# Patient Record
Sex: Female | Born: 1965 | Race: White | Hispanic: No | Marital: Married | Smoking: Current every day smoker
Health system: Southern US, Community
[De-identification: ages and names within clinical notes are randomized; demographics above are authoritative.]

## PROBLEM LIST (undated history)

## (undated) DIAGNOSIS — D693 Immune thrombocytopenic purpura: Secondary | ICD-10-CM

## (undated) DIAGNOSIS — R110 Nausea: Secondary | ICD-10-CM

## (undated) DIAGNOSIS — J329 Chronic sinusitis, unspecified: Secondary | ICD-10-CM

## (undated) DIAGNOSIS — Z9889 Other specified postprocedural states: Secondary | ICD-10-CM

## (undated) DIAGNOSIS — N201 Calculus of ureter: Secondary | ICD-10-CM

## (undated) DIAGNOSIS — E669 Obesity, unspecified: Secondary | ICD-10-CM

## (undated) DIAGNOSIS — N289 Disorder of kidney and ureter, unspecified: Secondary | ICD-10-CM

## (undated) DIAGNOSIS — Z87442 Personal history of urinary calculi: Secondary | ICD-10-CM

## (undated) DIAGNOSIS — R739 Hyperglycemia, unspecified: Secondary | ICD-10-CM

## (undated) DIAGNOSIS — I729 Aneurysm of unspecified site: Secondary | ICD-10-CM

## (undated) DIAGNOSIS — E785 Hyperlipidemia, unspecified: Secondary | ICD-10-CM

## (undated) DIAGNOSIS — R112 Nausea with vomiting, unspecified: Secondary | ICD-10-CM

## (undated) DIAGNOSIS — D649 Anemia, unspecified: Secondary | ICD-10-CM

## (undated) DIAGNOSIS — Z87891 Personal history of nicotine dependence: Secondary | ICD-10-CM

## (undated) DIAGNOSIS — E039 Hypothyroidism, unspecified: Secondary | ICD-10-CM

## (undated) DIAGNOSIS — T7840XA Allergy, unspecified, initial encounter: Secondary | ICD-10-CM

## (undated) DIAGNOSIS — F329 Major depressive disorder, single episode, unspecified: Principal | ICD-10-CM

## (undated) HISTORY — DX: Hypocalcemia: E83.51

## (undated) HISTORY — DX: Aneurysm of unspecified site: I72.9

## (undated) HISTORY — PX: ABDOMINAL HYSTERECTOMY: SHX81

## (undated) HISTORY — DX: Nausea: R11.0

## (undated) HISTORY — DX: Hypothyroidism, unspecified: E03.9

## (undated) HISTORY — DX: Hyperlipidemia, unspecified: E78.5

## (undated) HISTORY — PX: KNEE ARTHROSCOPY: SUR90

## (undated) HISTORY — DX: Major depressive disorder, single episode, unspecified: F32.9

## (undated) HISTORY — DX: Allergy, unspecified, initial encounter: T78.40XA

## (undated) HISTORY — DX: Personal history of nicotine dependence: Z87.891

## (undated) HISTORY — DX: Obesity, unspecified: E66.9

## (undated) HISTORY — DX: Chronic sinusitis, unspecified: J32.9

## (undated) HISTORY — DX: Hyperglycemia, unspecified: R73.9

## (undated) HISTORY — DX: Anemia, unspecified: D64.9

---

## 1997-11-10 HISTORY — PX: TUBAL LIGATION: SHX77

## 1998-11-10 HISTORY — PX: CHOLECYSTECTOMY: SHX55

## 2001-11-10 HISTORY — PX: EXTRACORPOREAL SHOCK WAVE LITHOTRIPSY: SHX1557

## 2010-04-03 ENCOUNTER — Emergency Department (HOSPITAL_BASED_OUTPATIENT_CLINIC_OR_DEPARTMENT_OTHER): Admission: EM | Admit: 2010-04-03 | Discharge: 2010-04-03 | Payer: Self-pay | Admitting: Emergency Medicine

## 2010-04-03 ENCOUNTER — Ambulatory Visit: Payer: Self-pay | Admitting: Interventional Radiology

## 2010-08-06 ENCOUNTER — Encounter: Admission: RE | Admit: 2010-08-06 | Discharge: 2010-08-06 | Payer: Self-pay | Admitting: Obstetrics and Gynecology

## 2010-10-15 ENCOUNTER — Ambulatory Visit (HOSPITAL_COMMUNITY)
Admission: RE | Admit: 2010-10-15 | Discharge: 2010-10-16 | Payer: Self-pay | Source: Home / Self Care | Attending: Obstetrics & Gynecology | Admitting: Obstetrics & Gynecology

## 2010-10-15 ENCOUNTER — Encounter: Payer: Self-pay | Admitting: Obstetrics & Gynecology

## 2010-10-15 HISTORY — PX: ROBOTIC ASSISTED TOTAL HYSTERECTOMY: SHX6085

## 2010-10-24 ENCOUNTER — Emergency Department (HOSPITAL_COMMUNITY)
Admission: EM | Admit: 2010-10-24 | Discharge: 2010-10-25 | Payer: Self-pay | Source: Home / Self Care | Admitting: Emergency Medicine

## 2011-01-20 LAB — DIFFERENTIAL
Basophils Relative: 0 % (ref 0–1)
Eosinophils Absolute: 0 10*3/uL (ref 0.0–0.7)
Lymphocytes Relative: 6 % — ABNORMAL LOW (ref 12–46)
Lymphs Abs: 1.1 10*3/uL (ref 0.7–4.0)
Monocytes Absolute: 1.2 10*3/uL — ABNORMAL HIGH (ref 0.1–1.0)
Monocytes Relative: 7 % (ref 3–12)

## 2011-01-20 LAB — URINALYSIS, ROUTINE W REFLEX MICROSCOPIC
Glucose, UA: NEGATIVE mg/dL
Protein, ur: 30 mg/dL — AB
Specific Gravity, Urine: 1.027 (ref 1.005–1.030)
Urobilinogen, UA: 0.2 mg/dL (ref 0.0–1.0)
pH: 6 (ref 5.0–8.0)

## 2011-01-20 LAB — BASIC METABOLIC PANEL
BUN: 8 mg/dL (ref 6–23)
CO2: 21 mEq/L (ref 19–32)
Calcium: 8.8 mg/dL (ref 8.4–10.5)
Chloride: 107 mEq/L (ref 96–112)
Creatinine, Ser: 0.93 mg/dL (ref 0.4–1.2)
GFR calc non Af Amer: >60 mL/min (ref >60)
Sodium: 135 mEq/L (ref 135–145)

## 2011-01-20 LAB — CBC
MCV: 92.4 fL (ref 78.0–100.0)
Platelets: 108 10*3/uL — ABNORMAL LOW (ref 150–400)
RDW: 12.2 % (ref 11.5–15.5)
WBC: 18.9 10*3/uL — ABNORMAL HIGH (ref 4.0–10.5)

## 2011-01-20 LAB — URINE MICROSCOPIC-ADD ON

## 2011-01-21 LAB — CBC
HCT: 44.5 % (ref 36.0–46.0)
MCV: 95.9 fL (ref 78.0–100.0)
MCV: 95.9 fL (ref 78.0–100.0)
Platelets: 105 10*3/uL — ABNORMAL LOW (ref 150–400)
Platelets: 137 10*3/uL — ABNORMAL LOW (ref 150–400)
RBC: 3.53 MIL/uL — ABNORMAL LOW (ref 3.87–5.11)
RDW: 12.4 % (ref 11.5–15.5)
RDW: 12.5 % (ref 11.5–15.5)
WBC: 10.2 10*3/uL (ref 4.0–10.5)
WBC: 11.2 10*3/uL — ABNORMAL HIGH (ref 4.0–10.5)

## 2011-01-21 LAB — SURGICAL PCR SCREEN
MRSA, PCR: NEGATIVE
Staphylococcus aureus: POSITIVE — AB

## 2011-01-21 LAB — PREGNANCY, URINE: Preg Test, Ur: NEGATIVE

## 2011-04-29 ENCOUNTER — Encounter: Payer: Self-pay | Admitting: Family Medicine

## 2011-04-29 ENCOUNTER — Ambulatory Visit (INDEPENDENT_AMBULATORY_CARE_PROVIDER_SITE_OTHER): Payer: Self-pay | Admitting: Family Medicine

## 2011-04-29 DIAGNOSIS — E669 Obesity, unspecified: Secondary | ICD-10-CM | POA: Insufficient documentation

## 2011-04-29 DIAGNOSIS — R5383 Other fatigue: Secondary | ICD-10-CM

## 2011-04-29 DIAGNOSIS — F17200 Nicotine dependence, unspecified, uncomplicated: Secondary | ICD-10-CM

## 2011-04-29 DIAGNOSIS — Z720 Tobacco use: Secondary | ICD-10-CM

## 2011-04-29 DIAGNOSIS — Z0000 Encounter for general adult medical examination without abnormal findings: Secondary | ICD-10-CM

## 2011-04-29 DIAGNOSIS — N800 Endometriosis of uterus: Secondary | ICD-10-CM

## 2011-04-29 DIAGNOSIS — E039 Hypothyroidism, unspecified: Secondary | ICD-10-CM

## 2011-04-29 DIAGNOSIS — F329 Major depressive disorder, single episode, unspecified: Secondary | ICD-10-CM

## 2011-04-29 DIAGNOSIS — E663 Overweight: Secondary | ICD-10-CM

## 2011-04-29 DIAGNOSIS — R5381 Other malaise: Secondary | ICD-10-CM

## 2011-04-29 MED ORDER — NICOTINE 10 MG IN INHA: RESPIRATORY_TRACT | Status: DC

## 2011-04-29 MED ORDER — LORAZEPAM 0.5 MG PO TABS: 0.5000 mg | ORAL_TABLET | Freq: Two times a day (BID) | ORAL | Status: DC

## 2011-04-29 MED ORDER — LORAZEPAM 0.5 MG PO TABS: 0.5000 mg | ORAL_TABLET | Freq: Every day | ORAL | Status: AC | PRN

## 2011-04-29 MED ORDER — BUPROPION HCL ER (XL) 150 MG PO TB24: ORAL_TABLET | ORAL | Status: DC

## 2011-04-29 NOTE — Patient Instructions (Signed)
Depression You have signs of depression. This is a common problem. It can occur at any age. It is often hard to recognize. People can suffer from depression and still have moments of enjoyment. Depression interferes with your basic ability to function in life. It upsets your relationships, sleep, eating, and work habits. CAUSES Depression is believed to be caused by an imbalance in brain chemicals. It may be triggered by an unpleasant event. Relationship crises, a death in the family, financial worries, retirement, or other stressors are normal causes of depression. Depression may also start for no known reason. Other factors that may play a part include medical illnesses, some medicines, genetics, and alcohol or drug abuse. SYMPTOMS  Feeling unhappy or worthless.   Long-lasting (chronic) tiredness or worn-out feeling.   Self-destructive thoughts and actions.   Not being able to sleep or sleeping too much.   Eating more than usual or not eating at all.   Headaches or feeling anxious.   Trouble concentrating or making decisions.   Unexplained physical problems and substance abuse.  TREATMENT Depression usually gets better with treatment. This can include:  Antidepressant medicines. It can take weeks before the proper dose is achieved and benefits are reached.   Talking with a therapist, clergyperson, counselor, or friend. These people can help you gain insight into your problem and regain control of your life.   Eating a good diet.   Getting regular physical exercise, such as walking for 30 minutes every day.   Not abusing alcohol or drugs.  Treating depression often takes 6 months or longer. This length of treatment is needed to keep symptoms from returning. Call your caregiver and arrange for follow-up care as suggested. SEEK IMMEDIATE MEDICAL CARE IF:  You start to have thoughts of hurting yourself or others.   Call your local emergency services (911 in [place]   Go to your  local medical emergency department.   Call the National Suicide Prevention Lifeline: 1-800-273-TALK (1-800-273-8255).  Document Released: 10/27/2005 Document Re-Released: 04/16/2010 [employer] Patient Information 2011 [employer]. 

## 2011-04-30 ENCOUNTER — Encounter: Payer: Self-pay | Admitting: Family Medicine

## 2011-04-30 DIAGNOSIS — F17200 Nicotine dependence, unspecified, uncomplicated: Secondary | ICD-10-CM | POA: Insufficient documentation

## 2011-04-30 DIAGNOSIS — Z87891 Personal history of nicotine dependence: Secondary | ICD-10-CM

## 2011-04-30 DIAGNOSIS — N800 Endometriosis of uterus: Secondary | ICD-10-CM | POA: Insufficient documentation

## 2011-04-30 DIAGNOSIS — M779 Enthesopathy, unspecified: Secondary | ICD-10-CM | POA: Insufficient documentation

## 2011-04-30 DIAGNOSIS — N8003 Adenomyosis of the uterus: Secondary | ICD-10-CM | POA: Insufficient documentation

## 2011-04-30 DIAGNOSIS — F329 Major depressive disorder, single episode, unspecified: Secondary | ICD-10-CM | POA: Insufficient documentation

## 2011-04-30 DIAGNOSIS — F32A Depression, unspecified: Secondary | ICD-10-CM

## 2011-04-30 DIAGNOSIS — E039 Hypothyroidism, unspecified: Secondary | ICD-10-CM

## 2011-04-30 DIAGNOSIS — R5383 Other fatigue: Secondary | ICD-10-CM | POA: Insufficient documentation

## 2011-04-30 HISTORY — DX: Personal history of nicotine dependence: Z87.891

## 2011-04-30 HISTORY — DX: Depression, unspecified: F32.A

## 2011-04-30 HISTORY — DX: Hypothyroidism, unspecified: E03.9

## 2011-04-30 NOTE — Assessment & Plan Note (Signed)
Patient notes a marked increase in fatigue in the last 2 weeks. Says the symptoms are similar to when her hypothyroidism was first discovered we'll check a TSH before making any further changes.

## 2011-04-30 NOTE — Assessment & Plan Note (Signed)
He reports a history of low calcium

## 2011-04-30 NOTE — Assessment & Plan Note (Signed)
Patient is under a great deal of stress with two teenagers at home and a very sick husband. Her husband has recently suffered a major heart attack. He had his first one at 36 and has recently had a significant injury and is now struggling with pleural effusions in congestive heart failure and has a defibrillator in place. The patient cries easily during the visit and is willing to start medications to help her manage her various stressors. We will start Wellbutrin XL 100 mg daily for the next 4 days and then increase to 20 mg daily she is given a small amount of Ativan to help with any panic moments for sleep to use sparingly. She is offered counseling and given informational about behavioral health. She is not sure she wants to proceed with the counseling at this time due to her time constraints but will start medications and we will see her in followup in one month's time or as needed.

## 2011-04-30 NOTE — Progress Notes (Signed)
Peggy Thomas [id number] [birth year] 04/30/2011      Progress Note New Patient  Subjective  Chief Complaint  Chief Complaint  Patient presents with  . Establish Care    new patient    HPI  Patient is a 45 year old Caucasian female who is in for new patient appointment. Additionally she reports she is enlarged and she needs a primary care physician but quickly begins to talk about her multiple stressors. Her husband has struggled with her disease since his mid-30s and recently has had a massive MI. He is presently a defibrillator in place has had significant heart muscle damage is struggling with congestive heart failure and fluid overload. He cries easily and speaks of also having to change at home. Her husband up copes with his stress by not discussing his health problems and her kids are coming to her for information about their father. She does not have a strong support system. She has no family in the area and is aware that she is not taking good care of herself in the middle of all the stress. She reports a long history, intermittent tendinitis in various joints at times. Denies any pain at the present time her recent injury. 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 She did report a good was covering and is happy she went through the procedure. She has a history of hypothyroidism and is presently struggling with increased fatigue over roughly the last 2 weeks. She reports a little fatigue is similar to the level she was struggling with him he does first discovered her hypothyroidism. She also reports a sensation of her neck feeling a little uncomfortable but also similar to the way she felt in her thyroid disease was discovered. She reports a previous physician had told her her calcium was low and she's been advised. She denies any recent febrile illness, fevers, chills, chest pain, palpitations, shortness of breath, muscle cramps, GI or GU  complaints at this time.  Past Medical History  Diagnosis Date  . Chicken pox   . Kidney stones   . Thyroid disease     hypothyroidism  . Hypocalcemia   . Overweight   . Tendonitis     migratory  . Fatigue   . Depression 04/30/2011  . Hypothyroidism 04/30/2011  . Tobacco abuse 04/30/2011    Past Surgical History  Procedure Date  . Cholecystectomy 2000  . Lasered kidney stones 2003    possibly right side  . Tubes tied 1999  . Cesarean section 1996 and 1998    X 2  . Abdominal hysterectomy 12-11    partial  . Knee arthroscopy     Family History  Problem Relation Age of Onset  . Diabetes Mother     type 2  . Asthma Mother   . Cancer Mother     skin cancer  . Cancer Father     bone  . Mental illness Sister     schizophrenia  . Alcohol abuse Maternal Grandmother   . Stroke Maternal Grandmother   . Other Maternal Grandfather     black lung  . Alzheimer's disease Paternal Grandmother   . Stroke Maternal Aunt     History   Social History  . Marital Status: Married    Spouse Name: N/A    Number of Children: N/A  . Years of Education: N/A   Occupational History  . Not on file.   Social History Main Topics  .  Smoking status: Current Everyday Smoker -- 1.0 packs/day for 30 years    Types: Cigarettes  . Smokeless tobacco: Never Used  . Alcohol Use: No     occasionally  . Drug Use: No  . Sexually Active: Yes -- Female partner(s)   Other Topics Concern  . Not on file   Social History Narrative  . No narrative on file    No current outpatient prescriptions on file prior to visit.    No Known Allergies  Review of Systems  Review of Systems  Constitutional: Positive for malaise/fatigue. Negative for fever, chills and weight loss.  HENT: Positive for neck pain. Negative for hearing loss, nosebleeds, congestion and sore throat.   Eyes: Negative for discharge.  Respiratory: Negative for cough, sputum production, shortness of breath, wheezing and stridor.    Cardiovascular: Negative for chest pain, palpitations and leg swelling.  Gastrointestinal: Negative for heartburn, nausea, vomiting, abdominal pain, diarrhea, constipation and blood in stool.  Genitourinary: Negative for dysuria, urgency, frequency and hematuria.  Musculoskeletal: Negative for myalgias, back pain and falls.  Skin: Negative for rash.  Neurological: Negative for dizziness, tremors, sensory change, focal weakness, loss of consciousness, weakness and headaches.  Endo/Heme/Allergies: Negative for polydipsia. Does not bruise/bleed easily.  Psychiatric/Behavioral: Positive for depression. Negative for suicidal ideas and substance abuse. The patient is nervous/anxious. The patient does not have insomnia.     Objective  BP 109/76  Pulse 60  Temp(Src) 98.1 F (36.7 C) (Oral)  Ht 5' 2.25" (1.581 m)  Wt 187 lb 6.4 oz (85.004 kg)  BMI 34.00 kg/m2  SpO2 97%  Physical Exam  Physical Exam  Constitutional: She is oriented to person, place, and time and well-developed, well-nourished, and in no distress. No distress.  HENT:  Head: Normocephalic and atraumatic.  Right Ear: External ear normal.  Left Ear: External ear normal.  Nose: Nose normal.  Mouth/Throat: Oropharynx is clear and moist. No oropharyngeal exudate.  Eyes: Conjunctivae are normal. Pupils are equal, round, and reactive to light. Right eye exhibits no discharge. Left eye exhibits no discharge. No scleral icterus.  Neck: Normal range of motion. Neck supple. Thyromegaly present.       Slight, diffuse enlargement of thyroid gland, no nodules palpable  Cardiovascular: Normal rate, regular rhythm, normal heart sounds and intact distal pulses.   No murmur heard. Pulmonary/Chest: Effort normal and breath sounds normal. No respiratory distress. She has no wheezes. She has no rales.  Abdominal: Soft. Bowel sounds are normal. She exhibits no distension and no mass. There is no tenderness.  Musculoskeletal: Normal range of  motion. She exhibits no edema and no tenderness.  Lymphadenopathy:    She has no cervical adenopathy.  Neurological: She is alert and oriented to person, place, and time. She has normal reflexes. No cranial nerve deficit. Gait normal. Coordination normal.  Skin: Skin is warm and dry. No rash noted. She is not diaphoretic.  Psychiatric: Memory and affect normal.       Cries easily       Assessment & Plan  Depression Patient is under a great deal of stress with two teenagers at home and a very sick husband. Her husband has recently suffered a major heart attack. He had his first one at 20 and has recently had a significant injury and is now struggling with pleural effusions in congestive heart failure and has a defibrillator in place. The patient cries easily during the visit and is willing to start medications to help her manage her  various stressors. We will start Wellbutrin XL 100 mg daily for the next 4 days and then increase to 20 mg daily she is given a small amount of Ativan to help with any panic moments for sleep to use sparingly. She is offered counseling and given informational about behavioral health. She is not sure she wants to proceed with the counseling at this time due to her time constraints but will start medications and we will see her in followup in one month's time or as needed.  Hypocalcemia He reports a history of low calcium  Fatigue Patient notes a marked increase in fatigue in the last 2 weeks. Says the symptoms are similar to when her hypothyroidism was first discovered we'll check a TSH before making any further changes.  Hypothyroidism Patient with history of hypothyroidism and recent increased fatigue we'll check a TSH request old records from her previous physician here  Overweight Patient under a great of stress right now but is encouraged to decrease by mouth intake and try to get it  Tobacco abuse Patient aware that she needs to quitting. He is willing  to try this given a prescription for Nicotrol inhaler to use and replacement of her cigarettes. She will try to weekend and if he either inhaler or cigarette and we will reevaluate at next visit she is counseled for greater than 3 minutes regarding the important health consequences of continuing to smoke and didn't need for cessation.

## 2011-04-30 NOTE — Assessment & Plan Note (Signed)
Patient with history of hypothyroidism and recent increased fatigue we'll check a TSH request old records from her previous physician here

## 2011-04-30 NOTE — Assessment & Plan Note (Signed)
Patient under a great of stress right now but is encouraged to decrease by mouth intake and try to get it

## 2011-04-30 NOTE — Assessment & Plan Note (Signed)
Patient aware that she needs to quitting. He is willing to try this given a prescription for Nicotrol inhaler to use and replacement of her cigarettes. She will try to weekend and if he either inhaler or cigarette and we will reevaluate at next visit she is counseled for greater than 3 minutes regarding the important health consequences of continuing to smoke and didn't need for cessation.

## 2011-05-05 ENCOUNTER — Other Ambulatory Visit (INDEPENDENT_AMBULATORY_CARE_PROVIDER_SITE_OTHER): Payer: Private Health Insurance - Indemnity

## 2011-05-05 DIAGNOSIS — Z0000 Encounter for general adult medical examination without abnormal findings: Secondary | ICD-10-CM

## 2011-05-05 DIAGNOSIS — E039 Hypothyroidism, unspecified: Secondary | ICD-10-CM

## 2011-05-05 LAB — HEPATIC FUNCTION PANEL
ALT: 25 U/L (ref 0–35)
AST: 18 U/L (ref 0–37)
Albumin: 4.5 g/dL (ref 3.5–5.2)
Total Bilirubin: 0.6 mg/dL (ref 0.3–1.2)
Total Protein: 7 g/dL (ref 6.0–8.3)

## 2011-05-05 LAB — LIPID PANEL
Cholesterol: 144 mg/dL (ref 0–200)
HDL: 36 mg/dL — ABNORMAL LOW (ref >39.00)
Total CHOL/HDL Ratio: 4
Triglycerides: 108 mg/dL (ref 0.0–149.0)

## 2011-05-05 LAB — CBC
MCH: 32 pg (ref 26.0–34.0)
Platelets: 108 10*3/uL — ABNORMAL LOW (ref 150–400)
WBC: 6.9 10*3/uL (ref 4.0–10.5)

## 2011-05-05 LAB — RENAL FUNCTION PANEL
BUN: 13 mg/dL (ref 6–23)
Calcium: 9.2 mg/dL (ref 8.4–10.5)
GFR: 62.35 mL/min (ref >60.00)
Sodium: 140 mEq/L (ref 135–145)

## 2011-05-05 LAB — TSH: TSH: 4.67 u[IU]/mL (ref 0.35–5.50)

## 2011-05-07 LAB — VITAMIN D 1,25 DIHYDROXY
Vitamin D 1, 25 (OH)2 Total: 44 pg/mL (ref 18–72)
Vitamin D3 1, 25 (OH)2: 44 pg/mL

## 2011-05-29 ENCOUNTER — Encounter: Payer: Self-pay | Admitting: Family Medicine

## 2011-05-29 ENCOUNTER — Ambulatory Visit (INDEPENDENT_AMBULATORY_CARE_PROVIDER_SITE_OTHER): Payer: Private Health Insurance - Indemnity | Admitting: Family Medicine

## 2011-05-29 VITALS — BP 132/84 | HR 67 | Temp 98.1°F | Ht 62.25 in | Wt 188.8 lb

## 2011-05-29 DIAGNOSIS — F17200 Nicotine dependence, unspecified, uncomplicated: Secondary | ICD-10-CM

## 2011-05-29 DIAGNOSIS — Z720 Tobacco use: Secondary | ICD-10-CM

## 2011-05-29 DIAGNOSIS — E039 Hypothyroidism, unspecified: Secondary | ICD-10-CM

## 2011-05-29 DIAGNOSIS — R5383 Other fatigue: Secondary | ICD-10-CM

## 2011-05-29 DIAGNOSIS — D696 Thrombocytopenia, unspecified: Secondary | ICD-10-CM

## 2011-05-29 DIAGNOSIS — E663 Overweight: Secondary | ICD-10-CM

## 2011-05-29 DIAGNOSIS — F329 Major depressive disorder, single episode, unspecified: Secondary | ICD-10-CM

## 2011-05-29 LAB — CBC WITH DIFFERENTIAL/PLATELET
Basophils Absolute: 0.1 10*3/uL (ref 0.0–0.1)
Basophils Relative: 0.6 % (ref 0.0–3.0)
Eosinophils Absolute: 0.1 10*3/uL (ref 0.0–0.7)
MCHC: 34.5 g/dL (ref 30.0–36.0)
MCV: 95.1 fl (ref 78.0–100.0)
Monocytes Absolute: 0.4 10*3/uL (ref 0.1–1.0)
Neutro Abs: 5.8 10*3/uL (ref 1.4–7.7)
Neutrophils Relative %: 67.3 % (ref 43.0–77.0)
RBC: 4.97 Mil/uL (ref 3.87–5.11)
RDW: 13.4 % (ref 11.5–14.6)

## 2011-05-29 MED ORDER — BUPROPION HCL ER (XL) 150 MG PO TB24: 150.0000 mg | ORAL_TABLET | ORAL | Status: DC

## 2011-05-29 MED ORDER — LEVOTHYROXINE SODIUM 50 MCG PO TABS: ORAL_TABLET | ORAL | Status: DC

## 2011-05-29 NOTE — Assessment & Plan Note (Signed)
Patient is still struggling with fatigue and her TSH mildly elevated, will have her start taking one extra Levothyroxine on Sundays each week and recheck in 8-10 weeks

## 2011-05-29 NOTE — Assessment & Plan Note (Signed)
Results of lab work we'll continue to monitor her

## 2011-05-29 NOTE — Assessment & Plan Note (Signed)
Patient try Bupropion on a good while on 150 mg dose and even within a few days felt her mood was mildly elevated. Unfortunately when she increased to the 300 mg dose within a few days she felt paranoid about her heart was racing so she stopped the medication altogether. She would like to restart the bupropion 150 mg daily and we will reevaluate at next visit. She never did try the lorazepam even when her husband recently underwent cardiac surgery secondary to she was afraid to over sedate her. She is encouraged to try half a tablet when she feels somewhat stressed but does not have any chance of having to go out to just evaluate her response to the medication so we need in the future she will feel comfortable using it.

## 2011-05-29 NOTE — Assessment & Plan Note (Signed)
Patient has had blood work for more than 6 months apart both showing mildly low platelets. Patient asymptomatic. Will repeat CBC today to continue to monitor. At last blood her hemoglobin was also mildly elevated so we will keep an eye on this. It had been normal in the past

## 2011-05-29 NOTE — Assessment & Plan Note (Signed)
Persistent but not worsening multifactorial. We'll increase levothyroxine slightly encouraged lifestyle changes such as good sleep and adequate exercise and we'll reassess at next visit

## 2011-05-29 NOTE — Assessment & Plan Note (Signed)
Encourage decreased by mouth intake and a 5-10 minute walk daily.

## 2011-05-29 NOTE — Patient Instructions (Signed)
Thrombocytopenia Thrombocytopenia is an uncommon, serious blood disease. Thrombocytopenia means there are not enough platelets in your blood. These are one of the cells necessary for clotting. Platelets are also called thrombocytes. CAUSES AND RISK FACTORS Thrombocytopenia is caused by:   Decreased production of platelets.   Increased destructions of platelets.   Enlarged spleen (hypersplenism).  Decreased production of platelets in the bone marrow can be caused by:  A plastic anemia in which your bone marrow quits making blood cells.   Cancer in the bone marrow.   Certain medications, including chemotherapy.   Overwhelming infection in the bone marrow.  Increased breakdown of platelets occurs in the bloodstream, spleen, or liver. This category includes:   Immune thrombocytopenic purpura (ITP).   Drug-induced thrombocytopenia.   Thrombotic thrombocytopenic purpura (TTP).   Certain inherited disorders.   Disseminated intravascular coagulation (DIC).  In hypersplenism, an enlarged spleen gathers up platelets from circulation so they are not available to do their job and help with clotting. The spleen can enlarge in cirrhosis and other conditions.  SYMPTOMS All of the symptoms of thrombocytopenia are essentially a side effect of poor blood clotting. Some of these are:  Abnormal bleeding.  Nosebleeds.   Fever.   Easy fatigue.   Shortness of breath on exertion.   Changes in awareness.   Confusion.   Purpura -- purplish discolorations in the skin produced by small bleeding vessels near the surface of the skin.   Bruising.  Yellowish color to the skin (jaundice).   Weakness.   Pallor.   Heart rate over 100 beats per minute.   Headache.   Speech changes.   Rash which may be petechial (like pin point purplish red spots on the skin and mucous membranes caused by bleeding from capillaries).   DIAGNOSIS Your caregiver will make this diagnosis based on exam and with  the use of blood tests. Routine blood work will show decreased platelets in the blood.  Sometimes a bone marrow study is done to look for the original cells (megakaryocytes), which make platelets.   Mucus membrane biopsy, if done, may show platelet clots in small blood vessels.  TREATMENT  Treatment depends on the cause of the condition.   In some cases, a replacement (transfusion) of platelets may be required to stop or prevent bleeding.   Sometimes the spleen must be surgically removed.   Plasmapheresis may be used to remove proteins from the blood that cause platelet destruction.  PROGNOSIS The outcome depends on the disorder causing the low platelet counts and on how low the platelet count falls. Any system in the body can be affected if there is too much bleeding. Some problems are:  Excessive bleeding (hemorrhage).   Vomiting blood or blood in the stools (gastrointestinal bleeding).   Bleeding in the brain (intracranial hemorrhage).  HOME CARE INSTRUCTIONS  Check the skin and linings inside your mouth for bruising or bleeding. Call your caregiver if you develop unexplained bleeding or bruising.   Watch your sputum, urine and stool for blood. Report any signs of bleeding to your caregiver.   DO NOT return to any activities that could cause bumps and bruises until given the OK by your caregiver.   Take extra care not to cut yourself when using scissors, needles, knives, tools. Try not to burn yourself when ironing or cooking.   Notify any healthcare providers, including dentists and eye doctors, of your condition.  SEEK IMMEDIATE MEDICAL CARE IF:  You develop headaches or blurred vision.  You develop active bleeding from anywhere in your body.   You become extremely tired, weak, or dizzy.   You become short of breath or develop pain.  Document Released: 10/27/2005 Document Re-Released: 04/24/2008 [facility] Patient Information 2011 [place], [place].

## 2011-05-29 NOTE — Progress Notes (Signed)
Peggy Thomas [id number] Aug 21, 1966 05/29/2011      Progress Note-Follow Up  Subjective  Chief Complaint  Chief Complaint  Patient presents with  . Follow-up    1 month follow up    HPI  Patient is a 45 year old Caucasian female who is in today for followup appointment. At her last visit she was struggling with a lot of stressors and very labile. She continues to show stressors including her husband's recent cardiac surgery. She will try bupropion at her last visit and she says that her 150 mg nightly she felt some mental clearing and slightly, but then when she increased the milligram dose she became paranoid tachycardic so she stopped the medication. At present she continues to have low mood, difficulty concentrating and lability. No suicidal ideation. She does feel she could use the medication and is willing to retry the bupropion at 150 mg. She never tried the lorazepam as she was afraid we'll pursue that her period she's had no recent illness, fevers, chills, chest pain, palpitations, shortness of breath, GI or GU complaints. She does have persistent fatigue.  Past Medical History  Diagnosis Date  . Chicken pox   . Kidney stones   . Thyroid disease     hypothyroidism  . Hypocalcemia   . Overweight   . Tendonitis     migratory  . Fatigue   . Depression 04/30/2011  . Hypothyroidism 04/30/2011  . Tobacco abuse 04/30/2011  . Uterus, adenomyosis 04/30/2011  . Thrombocytopenia 05/29/2011    Past Surgical History  Procedure Date  . Cholecystectomy 2000  . Lasered kidney stones 2003    possibly right side  . Tubes tied 1999  . Cesarean section 1996 and 1998    X 2  . Abdominal hysterectomy 12-11    partial  . Knee arthroscopy     Family History  Problem Relation Age of Onset  . Diabetes Mother     type 2  . Asthma Mother   . Cancer Mother     skin cancer  . Cancer Father     bone  . Mental illness Sister     schizophrenia  . Alcohol abuse Maternal Grandmother     . Stroke Maternal Grandmother   . Other Maternal Grandfather     black lung  . Alzheimer's disease Paternal Grandmother   . Stroke Maternal Aunt     History   Social History  . Marital Status: Married    Spouse Name: N/A    Number of Children: N/A  . Years of Education: N/A   Occupational History  . Not on file.   Social History Main Topics  . Smoking status: Current Everyday Smoker -- 1.0 packs/day for 30 years    Types: Cigarettes  . Smokeless tobacco: Never Used  . Alcohol Use: No     occasionally  . Drug Use: No  . Sexually Active: Yes -- Female partner(s)   Other Topics Concern  . Not on file   Social History Narrative  . No narrative on file    Current Outpatient Prescriptions on File Prior to Visit  Medication Sig Dispense Refill  . LORazepam (ATIVAN) 0.5 MG tablet Take 1 tablet (0.5 mg total) by mouth daily as needed for anxiety.  30 tablet  1  . nicotine (NICOTROL) 10 MG inhaler 1 puff po prn nicotine dependence, may repeat every 2 hours as needed, max of 10 cartridges daily  300 each  1    No Known Allergies  Review of Systems  Review of Systems  Constitutional: Positive for malaise/fatigue. Negative for fever.  HENT: Negative for congestion.   Eyes: Negative for discharge.  Respiratory: Negative for shortness of breath.   Cardiovascular: Negative for chest pain, palpitations and leg swelling.  Gastrointestinal: Negative for nausea, abdominal pain and diarrhea.  Genitourinary: Negative for dysuria.  Musculoskeletal: Negative for falls.  Skin: Negative for rash.  Neurological: Negative for loss of consciousness and headaches.  Endo/Heme/Allergies: Negative for polydipsia.  Psychiatric/Behavioral: Positive for depression. Negative for suicidal ideas. The patient is nervous/anxious. The patient does not have insomnia.     Objective  BP 132/84  Pulse 67  Temp(Src) 98.1 F (36.7 C) (Oral)  Ht 5' 2.25" (1.581 m)  Wt 188 lb 12.8 oz (85.639 kg)   BMI 34.26 kg/m2  SpO2 96%  Physical Exam  Physical Exam  Constitutional: She is oriented to person, place, and time and well-developed, well-nourished, and in no distress. No distress.  HENT:  Head: Normocephalic and atraumatic.  Eyes: Conjunctivae are normal.  Neck: Neck supple. No thyromegaly present.  Cardiovascular: Normal rate, regular rhythm and normal heart sounds.   No murmur heard. Pulmonary/Chest: Effort normal and breath sounds normal. She has no wheezes.  Abdominal: She exhibits no distension and no mass.  Musculoskeletal: She exhibits no edema.  Lymphadenopathy:    She has no cervical adenopathy.  Neurological: She is alert and oriented to person, place, and time.  Skin: Skin is warm and dry. No rash noted. She is not diaphoretic.  Psychiatric: Memory, affect and judgment normal.    Lab Results  Component Value Date   TSH 4.67 05/05/2011   Lab Results  Component Value Date   WBC 6.9 05/05/2011   HGB 16.0* 05/05/2011   HCT 46.5* 05/05/2011   MCV 93.0 05/05/2011   PLT 108* 05/05/2011   Lab Results  Component Value Date   CREATININE 1.0 05/05/2011   BUN 13 05/05/2011   NA 140 05/05/2011   K 4.9 05/05/2011   CL 107 05/05/2011   CO2 26 05/05/2011   Lab Results  Component Value Date   ALT 25 05/05/2011   AST 18 05/05/2011   ALKPHOS 52 05/05/2011   BILITOT 0.6 05/05/2011   Lab Results  Component Value Date   CHOL 144 05/05/2011   Lab Results  Component Value Date   HDL 36.00* 05/05/2011   Lab Results  Component Value Date   LDLCALC 86 05/05/2011   Lab Results  Component Value Date   TRIG 108.0 05/05/2011   Lab Results  Component Value Date   CHOLHDL 4 05/05/2011     Assessment & Plan  Hypothyroidism Patient is still struggling with fatigue and her TSH mildly elevated, will have her start taking one extra Levothyroxine on Sundays each week and recheck in 8-10 weeks  Overweight Encourage decreased by mouth intake and a 5-10 minute walk  daily.  Hypocalcemia Results of lab work we'll continue to monitor her  Fatigue Persistent but not worsening multifactorial. We'll increase levothyroxine slightly encouraged lifestyle changes such as good sleep and adequate exercise and we'll reassess at next visit  Tobacco abuse Patient continues to smoke. Did pick up the Nicotrol Inhaler and understands that she needs to quit but doesn't feel admitted yet encouraged to make a plan so we can proceed with further cessation patient in agreement. Counseled for greater than 3 minutes in this regard  Depression Patient try Bupropion on a good while on 150 mg dose and even  within a few days felt her mood was mildly elevated. Unfortunately when she increased to the 300 mg dose within a few days she felt paranoid about her heart was racing so she stopped the medication altogether. She would like to restart the bupropion 150 mg daily and we will reevaluate at next visit. She never did try the lorazepam even when her husband recently underwent cardiac surgery secondary to she was afraid to over sedate her. She is encouraged to try half a tablet when she feels somewhat stressed but does not have any chance of having to go out to just evaluate her response to the medication so we need in the future she will feel comfortable using it.  Thrombocytopenia Patient has had blood work for more than 6 months apart both showing mildly low platelets. Patient asymptomatic. Will repeat CBC today to continue to monitor. At last blood her hemoglobin was also mildly elevated so we will keep an eye on this. It had been normal in the past

## 2011-05-29 NOTE — Assessment & Plan Note (Signed)
Patient continues to smoke. Did pick up the Nicotrol Inhaler and understands that she needs to quit but doesn't feel admitted yet encouraged to make a plan so we can proceed with further cessation patient in agreement. Counseled for greater than 3 minutes in this regard

## 2011-07-23 ENCOUNTER — Other Ambulatory Visit (INDEPENDENT_AMBULATORY_CARE_PROVIDER_SITE_OTHER): Payer: Private Health Insurance - Indemnity

## 2011-07-23 DIAGNOSIS — D696 Thrombocytopenia, unspecified: Secondary | ICD-10-CM

## 2011-07-23 DIAGNOSIS — E039 Hypothyroidism, unspecified: Secondary | ICD-10-CM

## 2011-07-23 LAB — CBC WITH DIFFERENTIAL/PLATELET
Basophils Absolute: 0 10*3/uL (ref 0.0–0.1)
Eosinophils Absolute: 0.1 10*3/uL (ref 0.0–0.7)
Lymphocytes Relative: 26.9 % (ref 12.0–46.0)
MCHC: 34 g/dL (ref 30.0–36.0)
Monocytes Relative: 5 % (ref 3.0–12.0)
Neutrophils Relative %: 65.7 % (ref 43.0–77.0)
RBC: 4.88 Mil/uL (ref 3.87–5.11)
RDW: 12.8 % (ref 11.5–14.6)

## 2011-07-23 LAB — TSH: TSH: 1.77 u[IU]/mL (ref 0.35–5.50)

## 2011-07-28 ENCOUNTER — Encounter: Payer: Self-pay | Admitting: Family Medicine

## 2011-07-28 ENCOUNTER — Ambulatory Visit (INDEPENDENT_AMBULATORY_CARE_PROVIDER_SITE_OTHER): Payer: Private Health Insurance - Indemnity | Admitting: Family Medicine

## 2011-07-28 VITALS — BP 138/89 | HR 72 | Temp 98.5°F | Ht 62.25 in | Wt 187.4 lb

## 2011-07-28 DIAGNOSIS — F17200 Nicotine dependence, unspecified, uncomplicated: Secondary | ICD-10-CM

## 2011-07-28 DIAGNOSIS — F329 Major depressive disorder, single episode, unspecified: Secondary | ICD-10-CM

## 2011-07-28 DIAGNOSIS — D582 Other hemoglobinopathies: Secondary | ICD-10-CM

## 2011-07-28 DIAGNOSIS — D696 Thrombocytopenia, unspecified: Secondary | ICD-10-CM

## 2011-07-28 DIAGNOSIS — Z720 Tobacco use: Secondary | ICD-10-CM

## 2011-07-28 MED ORDER — BUPROPION HCL ER (XL) 300 MG PO TB24: 300.0000 mg | ORAL_TABLET | Freq: Every day | ORAL | Status: DC

## 2011-07-28 NOTE — Patient Instructions (Signed)
What is this medicine? NICOTINE (NIK [place] teen) helps people stop smoking. This medicine replaces the nicotine found in cigarettes and helps to decrease withdrawal effects. It is most effective when used in combination with a stop-smoking program. This medicine may be used for other purposes; ask your health care provider or pharmacist if you have questions.   What should I tell my health care provider before I take this medicine? They need to know if you have any of these conditions: -diabetes -heart disease, angina, irregular heartbeat or previous heart attack -lung disease, including asthma -overactive thyroid -pheochromocytoma -stomach problems or ulcers -an unusual or allergic reaction to nicotine, other medicines, foods, dyes, or preservatives -pregnant or trying to get pregnant -breast-feeding   How should I use this medicine? You should stop smoking completely before using the inhaler. Follow the directions carefully. Use exactly as directed. Do not use the inhaler more often than directed. Talk to your pediatrician regarding the use of this medicine in children. Special care may be needed. Overdosage: If you think you have taken too much of this medicine contact a poison control center or emergency room at once. NOTE: This medicine is only for you. Do not share this medicine with others.   What if I miss a dose? If you miss a dose, use it as soon as you can. If it is almost time for your next dose, use only that dose. Do not use double or extra doses.   What may interact with this medicine? -medicines for asthma -medicines for high blood pressure -medicines for mental depression   This list may not describe all possible interactions. Give your health care provider a list of all the medicines, herbs, non-prescription drugs, or dietary supplements you use. Also tell them if you smoke, drink alcohol, or use illegal drugs. Some items may interact with your medicine.   What  should I watch for while using this medicine? Always carry the inhaler with you. Do not smoke, chew nicotine gum, or use snuff while you are using this medicine. This reduces the chance of a nicotine overdose.   If you are a diabetic and you quit smoking, the effects of insulin may be increased and you may need to reduce your insulin dose. Check with your doctor or health care professional about how you should adjust your insulin dose.   What side effects may I notice from receiving this medicine? Side effects that you should report to your doctor or health care professional as soon as possible: -allergic reactions like skin rash, itching or hives, swelling of the face, lips, or tongue -changes in hearing -changes in vision -chest pain -cold sweats -confusion -fast, irregular heartbeat -feeling faint or lightheaded, falls -headache -increased saliva -nausea, vomiting -stomach pain -weakness   Side effects that usually do not require medical attention (report to your doctor or health care professional if they continue or are bothersome): -diarrhea -dry mouth -hiccups -irritability -nervousness or restlessness -trouble sleeping or vivid dreams   This list may not describe all possible side effects. Call your doctor for medical advice about side effects. You may report side effects to FDA at 1-800-FDA-1088.   Where should I keep my medicine? Keep out of the reach of children.   Store at room temperature below 25 degrees C (77 degrees F). Protect from heat and light. Throw away unused medicine after the expiration date.   NOTE:This sheet is a summary. It may not cover all possible information. If you have questions  about this medicine, talk to your doctor, pharmacist, or health care provider.      2011, [employer].

## 2011-07-28 NOTE — Assessment & Plan Note (Signed)
Patient continues to take her Wellbutrin dose to 150 dose. She does agree to go up to 300 mg extended-release dose. She is also in position Nicotrol inhalers we are previously given her. She is asked to wait 15 minutes prior to any nicotine use but if she must recur something she is asked to use the inhaler over cigarettes and to try for complete cessation. Counselled for nearly 10 minutes on the need to quit smoking to improve her health and decrease her worries

## 2011-07-28 NOTE — Assessment & Plan Note (Signed)
Patient in today to discuss lab work. Her CBCs over the last year head were reviewed with her. Of note she did undergo a hysterectomy in December for profuse endometrial bleeding. At that time she was mildly anemic but prior to that surgery and sensory hemoglobin has actually been elevated. Throughout all of this her platelets have been low and are in fact somewhat improved presently. She is very anxious and due to the persistence of her numbers, despite the fact that they're not worsening we will send her to hematology oncology for further evaluation. It is reinforced once again that her greatest risk to any serious developments is her ongoing tobacco use. She is asked once again to stop smoking altogether.

## 2011-07-28 NOTE — Progress Notes (Signed)
Peggy Thomas [id number] 07/23/1966 07/28/2011      Progress Note-Follow Up  Subjective  Chief Complaint  Chief Complaint  Patient presents with  . Discuss lab results    HPI  She is a 45 year old Caucasian female who is in today concerned about her lab work. At her last visit her hemoglobin was noted to be mildly but persistently elevated and her platelets were noted to be mildly but persistently low. As a result we asked her to return to repeat a CBC in her labs are largely unchanged. We have discussed the possibility of hematology oncology referral if symptoms worsen or receive worsen. Although not worsening and not improved. She is worried. Unfortunately continues to smoke about half pack per day. She has had mild URI symptoms with cough and congestion but those have improved. No fevers, chills, chest pain, palpitations, shortness of breath, GI or GU complaints. She does note some early satiety no nausea or abdominal pain is associated. She maintains himself on Wellbutrin 150 and has not gone up to 300. Her Nicotrol inhaler but is not using it at present. No bleeding gums no hematuria no blood in stool or other complaints noted today.  Past Medical History  Diagnosis Date  . Chicken pox   . Kidney stones   . Thyroid disease     hypothyroidism  . Hypocalcemia   . Overweight   . Tendonitis     migratory  . Fatigue   . Depression 04/30/2011  . Hypothyroidism 04/30/2011  . Tobacco abuse 04/30/2011  . Uterus, adenomyosis 04/30/2011  . Thrombocytopenia 05/29/2011    Past Surgical History  Procedure Date  . Cholecystectomy 2000  . Lasered kidney stones 2003    possibly right side  . Tubes tied 1999  . Cesarean section 1996 and 1998    X 2  . Abdominal hysterectomy 12-11    partial  . Knee arthroscopy     Family History  Problem Relation Age of Onset  . Diabetes Mother     type 2  . Asthma Mother   . Cancer Mother     skin cancer  . Cancer Father     bone  . Mental  illness Sister     schizophrenia  . Alcohol abuse Maternal Grandmother   . Stroke Maternal Grandmother   . Other Maternal Grandfather     black lung  . Alzheimer's disease Paternal Grandmother   . Stroke Maternal Aunt     History   Social History  . Marital Status: Married    Spouse Name: N/A    Number of Children: N/A  . Years of Education: N/A   Occupational History  . Not on file.   Social History Main Topics  . Smoking status: Current Everyday Smoker -- 1.0 packs/day for 30 years    Types: Cigarettes  . Smokeless tobacco: Never Used  . Alcohol Use: No     occasionally  . Drug Use: No  . Sexually Active: Yes -- Female partner(s)   Other Topics Concern  . Not on file   Social History Narrative  . No narrative on file    Current Outpatient Prescriptions on File Prior to Visit  Medication Sig Dispense Refill  . buPROPion (WELLBUTRIN XL) 150 MG 24 hr tablet Take 1 tablet (150 mg total) by mouth every morning.  30 tablet  3  . levothyroxine (SYNTHROID, LEVOTHROID) 50 MCG tablet 1 tab po daily except Sundays take 2 tabs  35 tablet  5  .  LORazepam (ATIVAN) 0.5 MG tablet Take 1 tablet (0.5 mg total) by mouth daily as needed for anxiety.  30 tablet  1  . nicotine (NICOTROL) 10 MG inhaler 1 puff po prn nicotine dependence, may repeat every 2 hours as needed, max of 10 cartridges daily  300 each  1    No Known Allergies  Review of Systems  Review of Systems  Constitutional: Negative for fever, chills, weight loss and malaise/fatigue.  HENT: Positive for congestion. Negative for nosebleeds and sore throat.        Has had some mild URI symptoms with congesiotn, dry cough, improving now  Eyes: Negative for discharge.  Respiratory: Positive for cough. Negative for shortness of breath.   Cardiovascular: Negative for chest pain, palpitations and leg swelling.  Gastrointestinal: Negative for nausea, abdominal pain, diarrhea, constipation, blood in stool and melena.    Genitourinary: Negative for dysuria, hematuria and flank pain.  Musculoskeletal: Negative for myalgias and falls.  Skin: Negative for rash.  Neurological: Negative for loss of consciousness and headaches.  Endo/Heme/Allergies: Negative for polydipsia.  Psychiatric/Behavioral: Negative for depression and suicidal ideas. The patient is nervous/anxious. The patient does not have insomnia.     Objective  BP 138/89  Pulse 72  Temp(Src) 98.5 F (36.9 C) (Oral)  Ht 5' 2.25" (1.581 m)  Wt 187 lb 6.4 oz (85.004 kg)  BMI 34.00 kg/m2  SpO2 99%  Physical Exam  Physical Exam  Constitutional: She is oriented to person, place, and time and well-developed, well-nourished, and in no distress. No distress.  HENT:  Head: Normocephalic and atraumatic.  Eyes: Conjunctivae are normal.  Neck: Neck supple. No thyromegaly present.       B/l, nt mildly ienlarged, no axillary or suprclavicular LN noted  Cardiovascular: Normal rate, regular rhythm and normal heart sounds.   No murmur heard. Pulmonary/Chest: Effort normal and breath sounds normal. She has no wheezes.  Abdominal: She exhibits no distension and no mass.  Musculoskeletal: She exhibits no edema.  Lymphadenopathy:    She has cervical adenopathy.  Neurological: She is alert and oriented to person, place, and time.  Skin: Skin is warm and dry. No rash noted. She is not diaphoretic.  Psychiatric: Memory, affect and judgment normal.    Lab Results  Component Value Date   TSH 1.77 07/23/2011   Lab Results  Component Value Date   WBC 5.6 07/23/2011   HGB 16.0* 07/23/2011   HCT 47.0* 07/23/2011   MCV 96.4 07/23/2011   PLT 121.0* 07/23/2011   Lab Results  Component Value Date   CREATININE 1.0 05/05/2011   BUN 13 05/05/2011   NA 140 05/05/2011   K 4.9 05/05/2011   CL 107 05/05/2011   CO2 26 05/05/2011   Lab Results  Component Value Date   ALT 25 05/05/2011   AST 18 05/05/2011   ALKPHOS 52 05/05/2011   BILITOT 0.6 05/05/2011   Lab Results   Component Value Date   CHOL 144 05/05/2011   Lab Results  Component Value Date   HDL 36.00* 05/05/2011   Lab Results  Component Value Date   LDLCALC 86 05/05/2011   Lab Results  Component Value Date   TRIG 108.0 05/05/2011   Lab Results  Component Value Date   CHOLHDL 4 05/05/2011     Assessment & Plan  Thrombocytopenia Patient in today to discuss lab work. Her CBCs over the last year head were reviewed with her. Of note she did undergo a hysterectomy in December for  profuse endometrial bleeding. At that time she was mildly anemic but prior to that surgery and sensory hemoglobin has actually been elevated. Throughout all of this her platelets have been low and are in fact somewhat improved presently. She is very anxious and due to the persistence of her numbers, despite the fact that they're not worsening we will send her to hematology oncology for further evaluation. It is reinforced once again that her greatest risk to any serious developments is her ongoing tobacco use. She is asked once again to stop smoking altogether.  Tobacco abuse Patient continues to take her Wellbutrin dose to 150 dose. She does agree to go up to 300 mg extended-release dose. She is also in position Nicotrol inhalers we are previously given her. She is asked to wait 15 minutes prior to any nicotine use but if she must recur something she is asked to use the inhaler over cigarettes and to try for complete cessation. Counselled for nearly 10 minutes on the need to quit smoking to improve her health and decrease her worries

## 2011-07-30 ENCOUNTER — Ambulatory Visit: Payer: Private Health Insurance - Indemnity | Admitting: Family Medicine

## 2011-08-05 ENCOUNTER — Ambulatory Visit: Payer: Private Health Insurance - Indemnity | Admitting: Hematology & Oncology

## 2011-09-16 ENCOUNTER — Encounter: Payer: Self-pay | Admitting: Family Medicine

## 2011-09-16 ENCOUNTER — Ambulatory Visit (INDEPENDENT_AMBULATORY_CARE_PROVIDER_SITE_OTHER): Payer: Private Health Insurance - Indemnity | Admitting: Family Medicine

## 2011-09-16 VITALS — BP 131/84 | HR 68 | Temp 97.5°F | Ht 62.25 in | Wt 193.4 lb

## 2011-09-16 DIAGNOSIS — M542 Cervicalgia: Secondary | ICD-10-CM

## 2011-09-16 DIAGNOSIS — F329 Major depressive disorder, single episode, unspecified: Secondary | ICD-10-CM

## 2011-09-16 DIAGNOSIS — Z23 Encounter for immunization: Secondary | ICD-10-CM

## 2011-09-16 DIAGNOSIS — Z720 Tobacco use: Secondary | ICD-10-CM

## 2011-09-16 DIAGNOSIS — F17200 Nicotine dependence, unspecified, uncomplicated: Secondary | ICD-10-CM

## 2011-09-16 DIAGNOSIS — D696 Thrombocytopenia, unspecified: Secondary | ICD-10-CM

## 2011-09-16 LAB — CBC
MCH: 31.5 pg (ref 26.0–34.0)
MCHC: 34.5 g/dL (ref 30.0–36.0)
MCV: 91.4 fL (ref 78.0–100.0)
Platelets: 128 10*3/uL — ABNORMAL LOW (ref 150–400)
RDW: 12.4 % (ref 11.5–15.5)
WBC: 6.4 10*3/uL (ref 4.0–10.5)

## 2011-09-16 MED ORDER — IBUPROFEN 600 MG PO TABS: 600.0000 mg | ORAL_TABLET | Freq: Four times a day (QID) | ORAL | Status: AC | PRN

## 2011-09-16 MED ORDER — CITALOPRAM HYDROBROMIDE 20 MG PO TABS: ORAL_TABLET | ORAL | Status: DC

## 2011-09-16 MED ORDER — CYCLOBENZAPRINE HCL 10 MG PO TABS: 10.0000 mg | ORAL_TABLET | Freq: Two times a day (BID) | ORAL | Status: AC | PRN

## 2011-09-16 NOTE — Assessment & Plan Note (Signed)
Has not had a cigarette since 08/09/11. Encouraged at length to continue in her efforts, offered some suggestions

## 2011-09-16 NOTE — Assessment & Plan Note (Signed)
Did not feel like the Wellbutrin was helping. She is tearful in visit, agrees to start Citalopram 10mg  daily then increase to 20 mg daily. May continue to use Lorazepam prn sparingly

## 2011-09-16 NOTE — Assessment & Plan Note (Signed)
Repeat CBC today and patient is scheduled to see Hematology later this week. She is apprehensive but she is offered reassurance that if she stays off of cigarettes her risk of any concerning problems drop dramatically

## 2011-09-16 NOTE — Progress Notes (Signed)
Peggy Thomas [id number] [birth year] 09/16/2011      Progress Note-Follow Up  Subjective  Chief Complaint  Chief Complaint  Patient presents with  . Follow-up    2 month follow up    HPI  Patient is a 45 yo caucasian female in today for follow up. She stopped her Wellbutrin a week ago. She was having some right shoulder pain, right sided neck pain and right hip pain and she thought that might be contributing. She has not noted any improvement since she stopped it. She is aware that she also had overexerted herself doing some yard work and working on a lawnmower just prior to the onset of the symptoms. She describes the pain in her neck starting at the occiput and radiating down the back of her shoulder and into her shoulder joint. She has muscle pain in the way around her shoulder into her axilla at times. No numbness or tingling noted down the arm but occasionally some radicular pain as noted. She has had similar episodes in the past which resolved spontaneously. Her right hip pain is less intense but has been occurring for about the same period of time roughly 4-6 weeks. It has come and gone only to recur again. She's tried Motrin as much as 800 mg and does get temporary relief in symptoms recur. She is seeing hematology in next week to evaluate her elevated hgb and low platelets. No recent illness, fevers, chills, chestpain, palp, sob. She continues to struggle with low mood. Did not feel that Wellbutrin is helping her but is labile but hasn't been willing to consider other medications. Her husband is now struggling with complete kidney failure and they are in the process of starting dialysis. She continues to show also with her 40 year old daughter and her mental health issues so she feels overwhelmed frequently.  Past Medical History  Diagnosis Date  . Chicken pox   . Kidney stones   . Thyroid disease     hypothyroidism  . Hypocalcemia   . Overweight   . Tendonitis     migratory  .  Fatigue   . Depression 04/30/2011  . Hypothyroidism 04/30/2011  . Tobacco abuse 04/30/2011  . Uterus, adenomyosis 04/30/2011  . Thrombocytopenia 05/29/2011    Past Surgical History  Procedure Date  . Cholecystectomy 2000  . Lasered kidney stones 2003    possibly right side  . Tubes tied 1999  . Cesarean section 1996 and 1998    X 2  . Abdominal hysterectomy 12-11    partial  . Knee arthroscopy     Family History  Problem Relation Age of Onset  . Diabetes Mother     type 2  . Asthma Mother   . Cancer Mother     skin cancer  . Cancer Father     bone  . Mental illness Sister     schizophrenia  . Alcohol abuse Maternal Grandmother   . Stroke Maternal Grandmother   . Other Maternal Grandfather     black lung  . Alzheimer's disease Paternal Grandmother   . Stroke Maternal Aunt     History   Social History  . Marital Status: Married    Spouse Name: N/A    Number of Children: N/A  . Years of Education: N/A   Occupational History  . Not on file.   Social History Main Topics  . Smoking status: Former Smoker -- 1.0 packs/day for 30 years    Types: Cigarettes    Quit  date: 08/09/2011  . Smokeless tobacco: Never Used  . Alcohol Use: No     occasionally  . Drug Use: No  . Sexually Active: Yes -- Female partner(s)   Other Topics Concern  . Not on file   Social History Narrative  . No narrative on file    Current Outpatient Prescriptions on File Prior to Visit  Medication Sig Dispense Refill  . levothyroxine (SYNTHROID, LEVOTHROID) 50 MCG tablet 1 tab po daily except Sundays take 2 tabs  35 tablet  5  . LORazepam (ATIVAN) 0.5 MG tablet Take 1 tablet (0.5 mg total) by mouth daily as needed for anxiety.  30 tablet  1    No Known Allergies  Review of Systems  Review of Systems  Constitutional: Negative for fever and malaise/fatigue.  HENT: Negative for congestion.   Eyes: Negative for discharge.  Respiratory: Negative for shortness of breath.   Cardiovascular:  Negative for chest pain, palpitations and leg swelling.  Gastrointestinal: Negative for nausea, abdominal pain and diarrhea.  Genitourinary: Negative for dysuria.  Musculoskeletal: Negative for falls.  Skin: Negative for rash.  Neurological: Negative for loss of consciousness and headaches.  Endo/Heme/Allergies: Negative for polydipsia.  Psychiatric/Behavioral: Positive for depression. Negative for suicidal ideas and substance abuse. The patient is nervous/anxious. The patient does not have insomnia.     Objective  BP 131/84  Pulse 68  Temp(Src) 97.5 F (36.4 C) (Oral)  Ht 5' 2.25" (1.581 m)  Wt 193 lb 6.4 oz (87.726 kg)  BMI 35.09 kg/m2  SpO2 98%  Physical Exam  Physical Exam  Constitutional: She is oriented to person, place, and time and well-developed, well-nourished, and in no distress. No distress.  HENT:  Head: Normocephalic and atraumatic.  Eyes: Conjunctivae are normal.  Neck: Neck supple. No thyromegaly present.  Cardiovascular: Normal rate, regular rhythm and normal heart sounds.   No murmur heard. Pulmonary/Chest: Effort normal and breath sounds normal. She has no wheezes.  Abdominal: She exhibits no distension and no mass.  Musculoskeletal: She exhibits no edema.  Lymphadenopathy:    She has no cervical adenopathy.  Neurological: She is alert and oriented to person, place, and time.  Skin: Skin is warm and dry. No rash noted. She is not diaphoretic.  Psychiatric: Memory and judgment normal.       tearful    Lab Results  Component Value Date   TSH 1.77 07/23/2011   Lab Results  Component Value Date   WBC 5.6 07/23/2011   HGB 16.0* 07/23/2011   HCT 47.0* 07/23/2011   MCV 96.4 07/23/2011   PLT 121.0* 07/23/2011   Lab Results  Component Value Date   CREATININE 1.0 05/05/2011   BUN 13 05/05/2011   NA 140 05/05/2011   K 4.9 05/05/2011   CL 107 05/05/2011   CO2 26 05/05/2011   Lab Results  Component Value Date   ALT 25 05/05/2011   AST 18 05/05/2011   ALKPHOS  52 05/05/2011   BILITOT 0.6 05/05/2011   Lab Results  Component Value Date   CHOL 144 05/05/2011   Lab Results  Component Value Date   HDL 36.00* 05/05/2011   Lab Results  Component Value Date   LDLCALC 86 05/05/2011   Lab Results  Component Value Date   TRIG 108.0 05/05/2011   Lab Results  Component Value Date   CHOLHDL 4 05/05/2011     Assessment & Plan  Tobacco abuse Has not had a cigarette since 08/09/11. Encouraged at length to continue  in her efforts, offered some suggestions  Depression Did not feel like the Wellbutrin was helping. She is tearful in visit, agrees to start Citalopram 10mg  daily then increase to 20 mg daily. May continue to use Lorazepam prn sparingly  Thrombocytopenia Repeat CBC today and patient is scheduled to see Hematology later this week. She is apprehensive but she is offered reassurance that if she stays off of cigarettes her risk of any concerning problems drop dramatically

## 2011-09-16 NOTE — Patient Instructions (Signed)
Depression You have signs of depression. This is a common problem. It can occur at any age. It is often hard to recognize. People can suffer from depression and still have moments of enjoyment. Depression interferes with your basic ability to function in life. It upsets your relationships, sleep, eating, and work habits. CAUSES  Depression is believed to be caused by an imbalance in brain chemicals. It may be triggered by an unpleasant event. Relationship crises, a death in the family, financial worries, retirement, or other stressors are normal causes of depression. Depression may also start for no known reason. Other factors that may play a part include medical illnesses, some medicines, genetics, and alcohol or drug abuse. SYMPTOMS   Feeling unhappy or worthless.   Long-lasting (chronic) tiredness or worn-out feeling.   Self-destructive thoughts and actions.   Not being able to sleep or sleeping too much.   Eating more than usual or not eating at all.   Headaches or feeling anxious.   Trouble concentrating or making decisions.   Unexplained physical problems and substance abuse.  TREATMENT  Depression usually gets better with treatment. This can include:  Antidepressant medicines. It can take weeks before the proper dose is achieved and benefits are reached.   Talking with a therapist, clergyperson, counselor, or friend. These people can help you gain insight into your problem and regain control of your life.   Eating a good diet.   Getting regular physical exercise, such as walking for 30 minutes every day.   Not abusing alcohol or drugs.  Treating depression often takes 6 months or longer. This length of treatment is needed to keep symptoms from returning. Call your caregiver and arrange for follow-up care as suggested. SEEK IMMEDIATE MEDICAL CARE IF:   You start to have thoughts of hurting yourself or others.   Call your local emergency services (911 in [place]   Go to  your local medical emergency department.   Call the National Suicide Prevention Lifeline: 1-800-273-TALK (1-800-273-8255).  Document Released: 10/27/2005 Document Revised: 07/09/2011 Document Reviewed: 03/29/2010 [employer] Patient Information 2012 [employer]. 

## 2011-09-18 ENCOUNTER — Other Ambulatory Visit: Payer: Self-pay | Admitting: Hematology & Oncology

## 2011-09-18 ENCOUNTER — Ambulatory Visit (HOSPITAL_BASED_OUTPATIENT_CLINIC_OR_DEPARTMENT_OTHER): Payer: Private Health Insurance - Indemnity | Admitting: Hematology & Oncology

## 2011-09-18 ENCOUNTER — Other Ambulatory Visit (HOSPITAL_BASED_OUTPATIENT_CLINIC_OR_DEPARTMENT_OTHER): Payer: Private Health Insurance - Indemnity | Admitting: Lab

## 2011-09-18 ENCOUNTER — Ambulatory Visit: Payer: Private Health Insurance - Indemnity

## 2011-09-18 VITALS — BP 117/80 | HR 68 | Temp 97.4°F | Ht 62.5 in | Wt 194.0 lb

## 2011-09-18 DIAGNOSIS — D696 Thrombocytopenia, unspecified: Secondary | ICD-10-CM

## 2011-09-18 DIAGNOSIS — D473 Essential (hemorrhagic) thrombocythemia: Secondary | ICD-10-CM

## 2011-09-18 DIAGNOSIS — E039 Hypothyroidism, unspecified: Secondary | ICD-10-CM

## 2011-09-18 LAB — CBC WITH DIFFERENTIAL (CANCER CENTER ONLY)
BASO%: 0.7 % (ref 0.0–2.0)
EOS%: 3.2 % (ref 0.0–7.0)
LYMPH#: 1.5 10*3/uL (ref 0.9–3.3)
MCH: 31.9 pg (ref 26.0–34.0)
MCHC: 36.3 g/dL — ABNORMAL HIGH (ref 32.0–36.0)
MONO%: 7.5 % (ref 0.0–13.0)
NEUT#: 3.4 10*3/uL (ref 1.5–6.5)
Platelets: 108 10*3/uL — ABNORMAL LOW (ref 145–400)
RDW: 11.9 % (ref 11.1–15.7)

## 2011-09-18 NOTE — Progress Notes (Signed)
CC:   Danise Edge, MD  DIAGNOSIS:  Thrombocytopenia.  HISTORY OF PRESENT ILLNESS:  Mrs. Galentine is a very nice 45 year old white female.  She was seen by Dr. Danise Edge.  She has had no significant medical issues.  She just had routine lab work done.  It showed that she had a low platelet count.  Going back to November 2011, a CBC was done which showed a white cell count of 11.2, hemoglobin 15, hematocrit 44, platelet count 137.  In June 2012, platelet count was 108.  On 07/23/2011, a CBC was done which showed a white cell count of 5.6, hemoglobin 16, hematocrit 47, platelet count was 121.  MCV was 96.  She has had some thyroid issues.  She is on Synthroid.  She was smoking.  She has now stopped.  She has had no bleeding.  No bruising.  She had a hysterectomy, I think, about a year ago.  Prior to this, she did not have any bleeding issues.  In fact, she has had past surgeries. Again, there have been no issues with respect to bleeding.  She has had no weight loss or weight gain.  Her appetite has been good. She is not a vegetarian.  There have been no rashes.  There has been no joint aches or pains.  She was kindly referred to the [facility] by Dr. Abner Greenspan for evaluation of the thrombocytopenia.  PAST MEDICAL HISTORY:  Remarkable for: 1. Hypothyroidism 2. Benign cholecystectomy. 3. Partial hysterectomy secondary to, I think, fibroids. 4. Depression. 5. Fatigue.  ALLERGIES:  None.  MEDICATIONS: 1. Wellbutrin XL 150 mg p.o. daily. 2. Synthroid 0.05 mg p.o. daily. 3. Ativan 0.5 mg daily p.r.n.  SOCIAL HISTORY:  Remarkable for past tobacco use.  She probably has about a 20-pack-year history of tobacco use.  There is no alcohol use. There are no occupational exposures.  FAMILY HISTORY:  Negative for any blood issues.  Her father died of "bone cancer."  There is history of CVA, diabetes, Alzheimer's.  There is also history of "skin cancer" with her  mother.  REVIEW OF SYSTEMS:  As stated in the history of present illness.  PHYSICAL EXAMINATION:  General Appearance:  This is a well-developed, well-nourished, white female in no obvious distress.  Vital Signs: 97.4, pulse 68, respiratory rate 16, blood pressure 117/80.  Weight is 194.  Head and Neck Exam:  A normocephalic, atraumatic skull.  There are no ocular or oral lesions.  There are no palpable cervical or supraclavicular lymph nodes.  Lungs:  Clear to percussion and auscultation bilaterally.  Cardiac Exam:  Regular rate and rhythm with normal S1 and S2.  There are no murmurs, rubs, or bruits.  Abdominal Exam:  Soft with good bowel sounds.  There is no palpable abdominal mass.  There is no fluid wave.  There is no palpable hepatosplenomegaly. Back Exam:  No kyphosis or osteoporotic changes.  There is no tenderness over the spine, ribs, or hips.  Extremities:  No clubbing, cyanosis, or edema.  She has good range of motion of her joints.  Skin Exam:  No rashes, ecchymosis or petechia.  IMAGING AND LABORATORY STUDIES:  White cell count 5.6, hemoglobin 14.4, hematocrit 40, platelet count 108.  MCV is 36.  Her peripheral smear shows a normochromic, normocytic population of red blood cells.  There are no nucleated red blood cells.  There are no teardrop cells.  I see no rouleaux formation.  There are no schistocytes.  I  see no target cells.  The white cells appeared normal in morphology and maturation. There is no immature myeloid or lymphoid forms.  There are no hypersegmented polys.  I see no blasts.  Platelets are minimally decreased in number.  She has several large platelets.  The platelets do appear well-granulated.  IMPRESSION:  Mrs. Ramnauth is a nice, 45 year old, white female with mild thrombocytopenia.  Her blood smear does not look suspicious.  I could find nothing on her physical exam that would suggest any serious hematologic disorder.  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  She does have several large platelets on her blood smear.  Typically, large platelets that are seen on blood smear are indicative of a destructive or sequestration issue.  She may have some mild splenomegaly, although I could not appreciate a spleen tip on her physical.  She may have some degree of hypersplenism if her spleen is not enlarged.  I do not see any evidence of a hematologic malignancy.  I do not see any evidence to suggest myelodysplasia.  Again, I would have to suspect immune thrombocytopenia as the most likely possibility.  Ultimately, Mrs. Novitski may need to have a bone marrow test done.  I do not see any indication for a bone marrow test right now.  I think we can just follow Mrs. Poulter along conservatively.  If her platelets decline further, then we can intervene sometime next year.  I explained Mrs. Casimir' situation to her and her husband.  They are both very, very nice.  They are both from [place], [place].  We will go ahead and plan to get her back to see [place] in another 3 months. I suspect that her platelet count will "bounce around" within a range. If we do find, however, that her platelet count is dropping then we may have to intervene.  I spent about an hour or so with her and her husband today.  Again, we had good fellowship.    ______________________________ [name], M.D. PRE/MEDQ  D:  09/18/2011  T:  09/18/2011  Job:  [id number]  ADDENDUM:  Ferritin is 185.  Vit B12 is 399.  RA is (-). ANA is (-).

## 2011-09-18 NOTE — Progress Notes (Signed)
CSN: [id number] [name] 09/18/2011

## 2011-09-22 LAB — VITAMIN B12: Vitamin B-12: 399 pg/mL (ref 211–911)

## 2011-09-22 LAB — HEMOGLOBINOPATHY EVALUATION
Hemoglobin Other: 0 %
Hgb A: 97 % (ref 96.8–97.8)
Hgb S Quant: 0 %

## 2011-09-22 LAB — FERRITIN: Ferritin: 185 ng/mL (ref 10–291)

## 2011-09-22 LAB — ANA: Anti Nuclear Antibody(ANA): NEGATIVE

## 2011-09-22 LAB — RETICULOCYTES (CHCC): Retic Ct Pct: 1.7 % (ref 0.4–2.3)

## 2011-10-14 ENCOUNTER — Other Ambulatory Visit: Payer: Self-pay | Admitting: Obstetrics & Gynecology

## 2011-10-14 DIAGNOSIS — Z1231 Encounter for screening mammogram for malignant neoplasm of breast: Secondary | ICD-10-CM

## 2011-11-10 ENCOUNTER — Ambulatory Visit
Admission: RE | Admit: 2011-11-10 | Discharge: 2011-11-10 | Disposition: A | Discharge status: Home / Self Care | Payer: Private Health Insurance - Indemnity | Source: Ambulatory Visit | Attending: Obstetrics & Gynecology | Admitting: Obstetrics & Gynecology

## 2011-11-10 DIAGNOSIS — Z1231 Encounter for screening mammogram for malignant neoplasm of breast: Secondary | ICD-10-CM

## 2011-11-12 ENCOUNTER — Ambulatory Visit: Payer: Private Health Insurance - Indemnity | Admitting: Family Medicine

## 2011-11-12 DIAGNOSIS — Z0289 Encounter for other administrative examinations: Secondary | ICD-10-CM

## 2011-12-11 ENCOUNTER — Other Ambulatory Visit: Payer: Self-pay | Admitting: Family Medicine

## 2011-12-19 ENCOUNTER — Ambulatory Visit (HOSPITAL_BASED_OUTPATIENT_CLINIC_OR_DEPARTMENT_OTHER): Payer: Private Health Insurance - Indemnity | Admitting: Hematology & Oncology

## 2011-12-19 ENCOUNTER — Other Ambulatory Visit (HOSPITAL_BASED_OUTPATIENT_CLINIC_OR_DEPARTMENT_OTHER): Payer: Private Health Insurance - Indemnity | Admitting: Lab

## 2011-12-19 VITALS — BP 106/73 | HR 66 | Temp 98.5°F | Ht 65.5 in | Wt 192.0 lb

## 2011-12-19 DIAGNOSIS — D693 Immune thrombocytopenic purpura: Secondary | ICD-10-CM

## 2011-12-19 DIAGNOSIS — D696 Thrombocytopenia, unspecified: Secondary | ICD-10-CM

## 2011-12-19 LAB — CBC WITH DIFFERENTIAL (CANCER CENTER ONLY)
BASO#: 0 10*3/uL (ref 0.0–0.2)
EOS%: 2.1 % (ref 0.0–7.0)
Eosinophils Absolute: 0.2 10*3/uL (ref 0.0–0.5)
HCT: 44.1 % (ref 34.8–46.6)
HGB: 15.5 g/dL (ref 11.6–15.9)
LYMPH#: 2.4 10*3/uL (ref 0.9–3.3)
MCH: 31.5 pg (ref 26.0–34.0)
MCHC: 35.1 g/dL (ref 32.0–36.0)
MONO%: 5.6 % (ref 0.0–13.0)
NEUT#: 4.2 10*3/uL (ref 1.5–6.5)
NEUT%: 58.7 % (ref 39.6–80.0)
RBC: 4.92 10*6/uL (ref 3.70–5.32)

## 2011-12-19 NOTE — Progress Notes (Signed)
This office note has been dictated.

## 2011-12-20 NOTE — Progress Notes (Signed)
CC:   Peggy Edge, MD  DIAGNOSIS:  Thrombocytopenia, chronic, benign.  CURRENT THERAPY:  Observation.  INTERIM HISTORY:  Peggy Thomas comes in for followup.  This is her 2nd office visit.  We initially saw her back in November.  At that point in time, all of our tests came back negative.  She feels well.  She has had no bleeding or bruising.  She has had no nausea or  vomiting.  There has been no change in her medications.  Again, when we first saw her, her vitamin B12 was 400. Her rheumatoid factor was less than 10.  Ferritin was normal.  PHYSICAL EXAMINATION:  This is a well-developed, well-nourished white female in no obvious distress.  Vital Signs:  Temperature 98.5, pulse 66, respiratory rate 14, blood pressure 106/73.  Weight is 192.  Head and neck:  Normocephalic, atraumatic skull.  There are no ocular or oral lesions.  There are no palpable cervical or supraclavicular lymph nodes. Lungs:  Clear bilaterally.  Cardiac:  Regular rate and rhythm with a normal S1 and S2.  There are no murmurs, rubs or bruits.  Abdomen:  Soft with good bowel sounds.  There is no palpable abdominal mass.  There is no palpable hepatosplenomegaly.  She has laparoscopy scars that are well- healed.  Back:  No tenderness over the spine, ribs, or hips. Extremities:  No  clubbing, cyanosis or edema.  Skin:  No rash, ecchymosis or petechia.  LABORATORY STUDIES:  White cell count is 7.2, hemoglobin 15.5, hematocrit 44.1, platelet count is 113.  MCV is 90.  Peripheral smear shows a normochromic, normocytic population of red blood cells.  There are no nucleated red blood cells.  I see no teardrop cells.  There is no rouleaux formation.  White cells appear normal in morphology maturation.  There is no hypersegmented polys.  She has no atypical lymphocytes.  Platelets are minimally decreased in number. Platelets are well-granulated.  She has several large platelets.  IMPRESSION:  Peggy Thomas is a charming  46 year old white female with thrombocytopenia.  Her platelet count is relatively stable.  Her workup so far has been negative.  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  Her blood smear certainly would suggest this by the large platelets.  We do not need to pursue any additional testing right now.  I want to go ahead and get her back in 6 months' time for followup.    ______________________________ [name], M.D. PRE/MEDQ  D:  12/19/2011  T:  12/20/2011  Job:  [id number]

## 2012-01-10 ENCOUNTER — Other Ambulatory Visit: Payer: Self-pay | Admitting: Family Medicine

## 2012-03-23 ENCOUNTER — Other Ambulatory Visit: Payer: Self-pay

## 2012-03-23 MED ORDER — CITALOPRAM HYDROBROMIDE 20 MG PO TABS: 20.0000 mg | ORAL_TABLET | Freq: Every day | ORAL | Status: DC

## 2012-03-23 NOTE — Telephone Encounter (Signed)
Opened in error

## 2012-05-31 ENCOUNTER — Other Ambulatory Visit: Payer: Self-pay

## 2012-05-31 DIAGNOSIS — E039 Hypothyroidism, unspecified: Secondary | ICD-10-CM

## 2012-05-31 MED ORDER — LEVOTHYROXINE SODIUM 50 MCG PO TABS: ORAL_TABLET | ORAL | Status: DC

## 2012-06-11 ENCOUNTER — Ambulatory Visit (HOSPITAL_BASED_OUTPATIENT_CLINIC_OR_DEPARTMENT_OTHER): Payer: Private Health Insurance - Indemnity | Admitting: Hematology & Oncology

## 2012-06-11 ENCOUNTER — Other Ambulatory Visit (HOSPITAL_BASED_OUTPATIENT_CLINIC_OR_DEPARTMENT_OTHER): Payer: Private Health Insurance - Indemnity | Admitting: Lab

## 2012-06-11 VITALS — BP 118/80 | HR 65 | Temp 97.6°F | Resp 16 | Ht 62.0 in | Wt 198.0 lb

## 2012-06-11 DIAGNOSIS — D696 Thrombocytopenia, unspecified: Secondary | ICD-10-CM

## 2012-06-11 DIAGNOSIS — D693 Immune thrombocytopenic purpura: Secondary | ICD-10-CM

## 2012-06-11 LAB — CBC WITH DIFFERENTIAL (CANCER CENTER ONLY)
BASO#: 0 10*3/uL (ref 0.0–0.2)
EOS%: 2.1 % (ref 0.0–7.0)
Eosinophils Absolute: 0.1 10*3/uL (ref 0.0–0.5)
HCT: 44 % (ref 34.8–46.6)
HGB: 15.6 g/dL (ref 11.6–15.9)
LYMPH%: 36.4 % (ref 14.0–48.0)
MCH: 32 pg (ref 26.0–34.0)
MCHC: 35.5 g/dL (ref 32.0–36.0)
MCV: 90 fL (ref 81–101)
MONO%: 6.8 % (ref 0.0–13.0)
NEUT%: 54.4 % (ref 39.6–80.0)
RBC: 4.88 10*6/uL (ref 3.70–5.32)

## 2012-06-11 NOTE — Progress Notes (Signed)
This office note has been dictated.

## 2012-06-12 NOTE — Progress Notes (Signed)
CC:   Peggy Edge, MD  DIAGNOSIS:  Chronic thrombocytopenia.  CURRENT THERAPY:  Observation.  INTERIM HISTORY:  Peggy Thomas comes in for followup.  She is doing well. She is getting ready to go to the beach this weekend.  I told her to make sure she wears sunscreen.  She has had no problems with bleeding or bruising.  She has had no cough or shortness of breath.  There has been no change in bowel or bladder habits.  Since we have been following her, we have not found anything that has been "malignant" with respect to her thrombocytopenia.  All of her studies have been negative.  PHYSICAL EXAMINATION:  General:  This is a well-developed, well- nourished white female in no obvious distress.  Vital signs:  97.6, pulse 65, respiratory rate 20, blood pressure 118/80.  Weight is 198. Head and neck:  Shows a normocephalic, atraumatic skull.  There are no ocular or oral lesions.  There are no palpable cervical or supraclavicular lymph nodes.  Lungs:  Clear bilaterally.  Cardiac: Regular rate and rhythm with normal S1, S2.  There are no murmurs, rubs or bruits.  Abdomen:  Soft with good bowel sounds.  There is no palpable abdominal mass.  There is no palpable hepatosplenomegaly.  Back:  No tenderness over the spine, ribs or hips.  Extremities:  Shows no clubbing, cyanosis or edema.  Neurologic:  No focal neurological deficits.  LABORATORY STUDIES:  White cell count is 6.3, hemoglobin 15.6, hematocrit 44, platelet count 108.  Peripheral smear shows a normochromic, normocytic population of red blood cells.  There are no nucleated red blood cells.  There are no schistocytes or spherocytes.  I see no target cells.  White cells appear normal in morphology and maturation.  There are no immature myeloid cells.  There are no hypersegmented polys.  There are no atypical lymphocytes.  Platelets are minimally decreased in number.  She has several large platelets that are well  granulated.  IMPRESSION:  Peggy Thomas is a 46 year old white female with chronic thrombocytopenia.  I really believe that this is her "normal".  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  Again, she is asymptomatic.  Her platelet count is not low enough that would effect her or would cause her to change in medications.  For now, we will plan to get her back in 6 months' time.  I think if all looks good in 6 months then we can let her go from the office.    ______________________________ [name], M.D. PRE/MEDQ  D:  06/11/2012  T:  06/12/2012  Job:  [id number]

## 2012-09-01 ENCOUNTER — Other Ambulatory Visit: Payer: Self-pay

## 2012-09-01 DIAGNOSIS — E039 Hypothyroidism, unspecified: Secondary | ICD-10-CM

## 2012-09-01 MED ORDER — LEVOTHYROXINE SODIUM 50 MCG PO TABS: ORAL_TABLET | ORAL | Status: DC

## 2012-09-01 NOTE — Telephone Encounter (Signed)
Pt needs appt

## 2012-10-30 ENCOUNTER — Other Ambulatory Visit: Payer: Self-pay | Admitting: Family Medicine

## 2012-11-10 ENCOUNTER — Other Ambulatory Visit: Payer: Self-pay | Admitting: Family Medicine

## 2012-11-17 ENCOUNTER — Telehealth: Payer: Self-pay | Admitting: Family Medicine

## 2012-11-17 NOTE — Telephone Encounter (Signed)
Pt informed and appt scheduled for 11-18-12 at 8:15

## 2012-11-17 NOTE — Telephone Encounter (Signed)
Sounds good can do a lipid, renal, cbc, tsh, hepatic let me know when or have Wilkie Aye order it for hypothyroid and annual exam

## 2012-11-17 NOTE — Telephone Encounter (Signed)
Please advise 

## 2012-11-17 NOTE — Telephone Encounter (Signed)
Left a message for pt to return my call. 

## 2012-11-17 NOTE — Telephone Encounter (Signed)
Patient would like to know if she can get lab work done prior to 11/29/12 appt. She needs to get her levothyroid refill

## 2012-11-18 ENCOUNTER — Other Ambulatory Visit (INDEPENDENT_AMBULATORY_CARE_PROVIDER_SITE_OTHER): Payer: Private Health Insurance - Indemnity

## 2012-11-18 DIAGNOSIS — Z0000 Encounter for general adult medical examination without abnormal findings: Secondary | ICD-10-CM

## 2012-11-18 DIAGNOSIS — E039 Hypothyroidism, unspecified: Secondary | ICD-10-CM

## 2012-11-18 LAB — RENAL FUNCTION PANEL
Chloride: 104 mEq/L (ref 96–112)
GFR: 70.64 mL/min (ref >60.00)
Phosphorus: 2.5 mg/dL (ref 2.3–4.6)
Potassium: 4.7 mEq/L (ref 3.5–5.1)

## 2012-11-18 LAB — CBC
HCT: 44.5 % (ref 36.0–46.0)
Hemoglobin: 15 g/dL (ref 12.0–15.0)
RDW: 13.2 % (ref 11.5–14.6)
WBC: 6.3 10*3/uL (ref 4.5–10.5)

## 2012-11-18 LAB — LIPID PANEL
HDL: 35.3 mg/dL — ABNORMAL LOW (ref >39.00)
Triglycerides: 97 mg/dL (ref 0.0–149.0)

## 2012-11-18 LAB — HEPATIC FUNCTION PANEL
Albumin: 3.8 g/dL (ref 3.5–5.2)
Total Protein: 6.7 g/dL (ref 6.0–8.3)

## 2012-11-18 NOTE — Progress Notes (Signed)
Labs only

## 2012-11-22 ENCOUNTER — Other Ambulatory Visit: Payer: Self-pay | Admitting: Family Medicine

## 2012-11-29 ENCOUNTER — Telehealth: Payer: Self-pay | Admitting: Hematology & Oncology

## 2012-11-29 ENCOUNTER — Ambulatory Visit: Payer: Private Health Insurance - Indemnity | Admitting: Family Medicine

## 2012-11-29 ENCOUNTER — Ambulatory Visit (INDEPENDENT_AMBULATORY_CARE_PROVIDER_SITE_OTHER): Payer: PRIVATE HEALTH INSURANCE | Admitting: Family Medicine

## 2012-11-29 ENCOUNTER — Encounter: Payer: Self-pay | Admitting: Family Medicine

## 2012-11-29 VITALS — BP 108/72 | HR 69 | Temp 97.9°F | Ht 62.25 in | Wt 197.0 lb

## 2012-11-29 DIAGNOSIS — D473 Essential (hemorrhagic) thrombocythemia: Secondary | ICD-10-CM

## 2012-11-29 DIAGNOSIS — F329 Major depressive disorder, single episode, unspecified: Secondary | ICD-10-CM

## 2012-11-29 DIAGNOSIS — E039 Hypothyroidism, unspecified: Secondary | ICD-10-CM

## 2012-11-29 DIAGNOSIS — D696 Thrombocytopenia, unspecified: Secondary | ICD-10-CM

## 2012-11-29 MED ORDER — LEVOTHYROXINE SODIUM 50 MCG PO TABS: ORAL_TABLET | ORAL | Status: DC

## 2012-11-29 MED ORDER — LEVOTHYROXINE SODIUM 50 MCG PO TABS: 50.0000 ug | ORAL_TABLET | Freq: Every day | ORAL | Status: DC

## 2012-11-29 NOTE — Telephone Encounter (Signed)
Patient walked into office and cx 12/16/12 apt.  She did not want to resch, she stated her primary care will monitor her blood work

## 2012-11-29 NOTE — Assessment & Plan Note (Addendum)
Well treated given a refill on Levothyroxine, 90 day supply, patient doing well. TSH check in 6 months.

## 2012-11-29 NOTE — Assessment & Plan Note (Signed)
Doing well off medications at this time. No changes

## 2012-11-29 NOTE — Progress Notes (Signed)
Patient ID: Peggy Thomas, female   DOB: 07/24/1966, 47 y.o.   MRN: [id number] Peggy Thomas [id number] 1966-02-09 11/29/2012      Progress Note-Follow Up  Subjective  Chief Complaint  Chief Complaint  Patient presents with  . Medication Refill    HPI  Patient is a 47 we'll Caucasian female who is here today for followup on multiple medical problems. She's feeling well. She's recently returned to work and other than fatigue is handling it well. No recent illness, fevers, chills, chest pain, palpitations, shortness of breath, GI or GU complaints are noted. She has run out of her levothyroxine and has not taken any doses for a few days. Denies any acute complaints as a result.  Past Medical History  Diagnosis Date  . Chicken pox   . Kidney stones   . Thyroid disease     hypothyroidism  . Hypocalcemia   . Overweight   . Tendonitis     migratory  . Fatigue   . Depression 04/30/2011  . Hypothyroidism 04/30/2011  . Tobacco abuse 04/30/2011  . Uterus, adenomyosis 04/30/2011  . Thrombocytopenia 05/29/2011    Past Surgical History  Procedure Date  . Cholecystectomy 2000  . Lasered kidney stones 2003    possibly right side  . Tubes tied 1999  . Cesarean section 1996 and 1998    X 2  . Abdominal hysterectomy 12-11    partial  . Knee arthroscopy     Family History  Problem Relation Age of Onset  . Diabetes Mother     type 2  . Asthma Mother   . Cancer Mother     skin cancer  . Cancer Father     bone  . Mental illness Sister     schizophrenia  . Alcohol abuse Maternal Grandmother   . Stroke Maternal Grandmother   . Other Maternal Grandfather     black lung  . Alzheimer's disease Paternal Grandmother   . Stroke Maternal Aunt     History   Social History  . Marital Status: Married    Spouse Name: N/A    Number of Children: N/A  . Years of Education: N/A   Occupational History  . Not on file.   Social History Main Topics  . Smoking status: Former Smoker  -- 1.0 packs/day for 30 years    Types: Cigarettes    Quit date: 08/09/2011  . Smokeless tobacco: Never Used  . Alcohol Use: No     Comment: occasionally  . Drug Use: No  . Sexually Active: Yes -- Female partner(s)   Other Topics Concern  . Not on file   Social History Narrative  . No narrative on file    Current Outpatient Prescriptions on File Prior to Visit  Medication Sig Dispense Refill  . ibuprofen (ADVIL,MOTRIN) 600 MG tablet Take 600 mg by mouth Daily.      Marland Kitchen levothyroxine (SYNTHROID, LEVOTHROID) 50 MCG tablet Take 1 tablet (50 mcg total) by mouth daily.  90 tablet  3    No Known Allergies  Review of Systems  Review of Systems  Constitutional: Negative for fever and malaise/fatigue.  HENT: Negative for congestion.   Eyes: Negative for discharge.  Respiratory: Negative for shortness of breath.   Cardiovascular: Negative for chest pain, palpitations and leg swelling.  Gastrointestinal: Negative for nausea, abdominal pain and diarrhea.  Genitourinary: Negative for dysuria.  Musculoskeletal: Negative for falls.  Skin: Negative for rash.  Neurological: Negative for loss of consciousness and  headaches.  Endo/Heme/Allergies: Negative for polydipsia.  Psychiatric/Behavioral: Negative for depression and suicidal ideas. The patient is not nervous/anxious and does not have insomnia.     Objective  BP 108/72  Pulse 69  Temp 97.9 F (36.6 C) (Oral)  Ht 5' 2.25" (1.581 m)  Wt 197 lb (89.359 kg)  BMI 35.74 kg/m2  SpO2 96%  Physical Exam  Physical Exam  Constitutional: She is oriented to person, place, and time and well-developed, well-nourished, and in no distress. No distress.  HENT:  Head: Normocephalic and atraumatic.  Eyes: Conjunctivae normal are normal.  Neck: Neck supple. No thyromegaly present.  Cardiovascular: Normal rate, regular rhythm and normal heart sounds.   No murmur heard. Pulmonary/Chest: Effort normal and breath sounds normal. She has no  wheezes.  Abdominal: She exhibits no distension and no mass.  Musculoskeletal: She exhibits no edema.  Lymphadenopathy:    She has no cervical adenopathy.  Neurological: She is alert and oriented to person, place, and time.  Skin: Skin is warm and dry. No rash noted. She is not diaphoretic.  Psychiatric: Memory, affect and judgment normal.    Lab Results  Component Value Date   TSH 1.50 11/18/2012   Lab Results  Component Value Date   WBC 6.3 11/18/2012   HGB 15.0 11/18/2012   HCT 44.5 11/18/2012   MCV 94.3 11/18/2012   PLT 116.0* 11/18/2012   Lab Results  Component Value Date   CREATININE 0.9 11/18/2012   BUN 10 11/18/2012   NA 138 11/18/2012   K 4.7 11/18/2012   CL 104 11/18/2012   CO2 31 11/18/2012   Lab Results  Component Value Date   ALT 30 11/18/2012   AST 21 11/18/2012   ALKPHOS 47 11/18/2012   BILITOT 0.8 11/18/2012   Lab Results  Component Value Date   CHOL 128 11/18/2012   Lab Results  Component Value Date   HDL 35.30* 11/18/2012   Lab Results  Component Value Date   LDLCALC 73 11/18/2012   Lab Results  Component Value Date   TRIG 97.0 11/18/2012   Lab Results  Component Value Date   CHOLHDL 4 11/18/2012     Assessment & Plan  Hypothyroidism Well treated given a refill on Levothyroxine, 90 day supply, patient doing well. TSH check in 6 months.  Hypocalcemia Repeat renal in 6 months  Depression Doing well off medications at this time. No changes  Thrombocytopenia Mild, stable, is asking to switch her check of this to incorporate it in her labs here. Agreed will check in 6 months and refer back to hematology if it changes significantly

## 2012-11-29 NOTE — Assessment & Plan Note (Signed)
Repeat renal in 6 months

## 2012-11-29 NOTE — Assessment & Plan Note (Signed)
Mild, stable, is asking to switch her check of this to incorporate it in her labs here. Agreed will check in 6 months and refer back to hematology if it changes significantly

## 2012-12-16 ENCOUNTER — Other Ambulatory Visit: Payer: Private Health Insurance - Indemnity | Admitting: Lab

## 2012-12-16 ENCOUNTER — Ambulatory Visit: Payer: Private Health Insurance - Indemnity | Admitting: Hematology & Oncology

## 2013-06-07 ENCOUNTER — Ambulatory Visit: Payer: PRIVATE HEALTH INSURANCE | Admitting: Family Medicine

## 2013-06-09 ENCOUNTER — Emergency Department (HOSPITAL_BASED_OUTPATIENT_CLINIC_OR_DEPARTMENT_OTHER)
Admission: EM | Admit: 2013-06-09 | Discharge: 2013-06-09 | Disposition: A | Discharge status: Home / Self Care | Payer: Managed Care, Other (non HMO) | Attending: Emergency Medicine | Admitting: Emergency Medicine

## 2013-06-09 ENCOUNTER — Emergency Department (HOSPITAL_BASED_OUTPATIENT_CLINIC_OR_DEPARTMENT_OTHER): Payer: Managed Care, Other (non HMO)

## 2013-06-09 ENCOUNTER — Encounter (HOSPITAL_BASED_OUTPATIENT_CLINIC_OR_DEPARTMENT_OTHER): Payer: Self-pay | Admitting: *Deleted

## 2013-06-09 DIAGNOSIS — E039 Hypothyroidism, unspecified: Secondary | ICD-10-CM | POA: Insufficient documentation

## 2013-06-09 DIAGNOSIS — N390 Urinary tract infection, site not specified: Secondary | ICD-10-CM

## 2013-06-09 DIAGNOSIS — Z79899 Other long term (current) drug therapy: Secondary | ICD-10-CM | POA: Insufficient documentation

## 2013-06-09 DIAGNOSIS — Z8639 Personal history of other endocrine, nutritional and metabolic disease: Secondary | ICD-10-CM | POA: Insufficient documentation

## 2013-06-09 DIAGNOSIS — F17200 Nicotine dependence, unspecified, uncomplicated: Secondary | ICD-10-CM | POA: Insufficient documentation

## 2013-06-09 DIAGNOSIS — Z8619 Personal history of other infectious and parasitic diseases: Secondary | ICD-10-CM | POA: Insufficient documentation

## 2013-06-09 DIAGNOSIS — R110 Nausea: Secondary | ICD-10-CM | POA: Insufficient documentation

## 2013-06-09 DIAGNOSIS — Z8659 Personal history of other mental and behavioral disorders: Secondary | ICD-10-CM | POA: Insufficient documentation

## 2013-06-09 DIAGNOSIS — Z862 Personal history of diseases of the blood and blood-forming organs and certain disorders involving the immune mechanism: Secondary | ICD-10-CM | POA: Insufficient documentation

## 2013-06-09 DIAGNOSIS — N200 Calculus of kidney: Secondary | ICD-10-CM | POA: Insufficient documentation

## 2013-06-09 DIAGNOSIS — Z8742 Personal history of other diseases of the female genital tract: Secondary | ICD-10-CM | POA: Insufficient documentation

## 2013-06-09 DIAGNOSIS — E663 Overweight: Secondary | ICD-10-CM | POA: Insufficient documentation

## 2013-06-09 DIAGNOSIS — Z8739 Personal history of other diseases of the musculoskeletal system and connective tissue: Secondary | ICD-10-CM | POA: Insufficient documentation

## 2013-06-09 LAB — CBC WITH DIFFERENTIAL/PLATELET
Basophils Absolute: 0 10*3/uL (ref 0.0–0.1)
Basophils Relative: 0 % (ref 0–1)
Eosinophils Absolute: 0.1 10*3/uL (ref 0.0–0.7)
Eosinophils Relative: 1 % (ref 0–5)
Lymphs Abs: 2.2 10*3/uL (ref 0.7–4.0)
MCH: 31.9 pg (ref 26.0–34.0)
MCHC: 35.4 g/dL (ref 30.0–36.0)
MCV: 90.2 fL (ref 78.0–100.0)
Neutrophils Relative %: 74 % (ref 43–77)
Platelets: 118 10*3/uL — ABNORMAL LOW (ref 150–400)
RBC: 4.51 MIL/uL (ref 3.87–5.11)
RDW: 12.3 % (ref 11.5–15.5)

## 2013-06-09 LAB — BASIC METABOLIC PANEL
Calcium: 9.9 mg/dL (ref 8.4–10.5)
GFR calc Af Amer: 77 mL/min — ABNORMAL LOW (ref >90)
GFR calc non Af Amer: 66 mL/min — ABNORMAL LOW (ref >90)
Potassium: 4 mEq/L (ref 3.5–5.1)
Sodium: 142 mEq/L (ref 135–145)

## 2013-06-09 LAB — URINALYSIS, ROUTINE W REFLEX MICROSCOPIC
Glucose, UA: NEGATIVE mg/dL
Ketones, ur: 15 mg/dL — AB
Protein, ur: 30 mg/dL — AB
pH: 6.5 (ref 5.0–8.0)

## 2013-06-09 LAB — URINE MICROSCOPIC-ADD ON

## 2013-06-09 MED ORDER — KETOROLAC TROMETHAMINE 30 MG/ML IJ SOLN
30.0000 mg | Freq: Once | INTRAMUSCULAR | Status: AC
  Administered 2013-06-09: 30 mg via INTRAVENOUS
  Filled 2013-06-09: qty 1

## 2013-06-09 MED ORDER — MORPHINE SULFATE 4 MG/ML IJ SOLN
4.0000 mg | Freq: Once | INTRAMUSCULAR | Status: AC
  Administered 2013-06-09: 4 mg via INTRAVENOUS
  Filled 2013-06-09: qty 1

## 2013-06-09 MED ORDER — CIPROFLOXACIN HCL 500 MG PO TABS: 500.0000 mg | ORAL_TABLET | Freq: Two times a day (BID) | ORAL | Status: DC

## 2013-06-09 MED ORDER — ONDANSETRON HCL 4 MG/2ML IJ SOLN
4.0000 mg | Freq: Once | INTRAMUSCULAR | Status: AC
  Administered 2013-06-09: 4 mg via INTRAVENOUS
  Filled 2013-06-09: qty 2

## 2013-06-09 MED ORDER — OXYCODONE-ACETAMINOPHEN 5-325 MG PO TABS: 2.0000 | ORAL_TABLET | ORAL | Status: DC | PRN

## 2013-06-09 NOTE — ED Notes (Signed)
Right flank pain x 1 hour. Nausea. Hx of kidney stone.

## 2013-06-09 NOTE — ED Provider Notes (Signed)
CSN: [id number]     Arrival date & time 06/09/13  1904 History     First MD Initiated Contact with Patient 06/09/13 1954     Chief Complaint  Patient presents with  . Flank Pain   (Consider location/radiation/quality/duration/timing/severity/associated sxs/prior Treatment) HPI Comments: Patient presents with complaints of severe right flank and right lower abdominal pain that started approximately 2 hours prior to presentation. Her symptoms came on suddenly and quite dramatically. She denies any fevers or chills. She feels nauseated but has not vomited. She says she has a history of kidney stones however they have never hurt her like this before. She is taken medication at home however hasn't obtained no relief.  Patient is a 47 y.o. female presenting with flank pain. The history is provided by the patient.  Flank Pain This is a new problem. The current episode started 1 to 2 hours ago. The problem occurs constantly. The problem has not changed since onset.Associated symptoms include abdominal pain. Nothing aggravates the symptoms. Nothing relieves the symptoms. She has tried nothing for the symptoms. The treatment provided no relief.    Past Medical History  Diagnosis Date  . Chicken pox   . Kidney stones   . Thyroid disease     hypothyroidism  . Hypocalcemia   . Overweight(278.02)   . Tendonitis     migratory  . Fatigue   . Depression 04/30/2011  . Hypothyroidism 04/30/2011  . Tobacco abuse 04/30/2011  . Uterus, adenomyosis 04/30/2011  . Thrombocytopenia 05/29/2011   Past Surgical History  Procedure Laterality Date  . Cholecystectomy  2000  . Lasered kidney stones  2003    possibly right side  . Tubes tied  1999  . Cesarean section  1996 and 1998    X 2  . Abdominal hysterectomy  12-11    partial  . Knee arthroscopy     Family History  Problem Relation Age of Onset  . Diabetes Mother     type 2  . Asthma Mother   . Cancer Mother     skin cancer  . Cancer Father    bone  . Mental illness Sister     schizophrenia  . Alcohol abuse Maternal Grandmother   . Stroke Maternal Grandmother   . Other Maternal Grandfather     black lung  . Alzheimer's disease Paternal Grandmother   . Stroke Maternal Aunt    History  Substance Use Topics  . Smoking status: Current Every Day Smoker -- 1.00 packs/day for 30 years    Types: Cigarettes    Last Attempt to Quit: 08/09/2011  . Smokeless tobacco: Never Used  . Alcohol Use: No     Comment: occasionally   OB History   Grav Para Term Preterm Abortions TAB SAB Ect Mult Living                 Review of Systems  Gastrointestinal: Positive for abdominal pain.  Genitourinary: Positive for flank pain.  All other systems reviewed and are negative.    Allergies  Review of patient's allergies indicates no known allergies.  Home Medications   Current Outpatient Rx  Name  Route  Sig  Dispense  Refill  . ibuprofen (ADVIL,MOTRIN) 600 MG tablet   Oral   Take 600 mg by mouth Daily.         Marland Kitchen levothyroxine (SYNTHROID, LEVOTHROID) 50 MCG tablet   Oral   Take 1 tablet (50 mcg total) by mouth daily.   90 tablet  3    BP 115/63  Pulse 58  Temp(Src) 98.4 F (36.9 C) (Oral)  Resp 20  Ht 5\' 2"  (1.575 m)  Wt 197 lb (89.359 kg)  BMI 36.02 kg/m2  SpO2 99% Physical Exam  Nursing note and vitals reviewed. Constitutional: She is oriented to person, place, and time. She appears well-developed and well-nourished. No distress.  HENT:  Head: Normocephalic and atraumatic.  Neck: Normal range of motion. Neck supple.  Cardiovascular: Normal rate and regular rhythm.  Exam reveals no gallop and no friction rub.   No murmur heard. Pulmonary/Chest: Effort normal and breath sounds normal. No respiratory distress. She has no wheezes.  Abdominal: Soft. Bowel sounds are normal. She exhibits no distension. There is tenderness. There is no rebound and no guarding.  There is right-sided cva ttp.  Musculoskeletal: Normal  range of motion.  Neurological: She is alert and oriented to person, place, and time.  Skin: Skin is warm and dry. She is not diaphoretic.    ED Course   Procedures (including critical care time)  Labs Reviewed  URINALYSIS, ROUTINE W REFLEX MICROSCOPIC - Abnormal; Notable for the following:    APPearance CLOUDY (*)    Hgb urine dipstick LARGE (*)    Ketones, ur 15 (*)    Protein, ur 30 (*)    Nitrite POSITIVE (*)    Leukocytes, UA SMALL (*)    All other components within normal limits  URINE MICROSCOPIC-ADD ON - Abnormal; Notable for the following:    Squamous Epithelial / LPF FEW (*)    Bacteria, UA MANY (*)    All other components within normal limits  URINE CULTURE  CBC WITH DIFFERENTIAL  BASIC METABOLIC PANEL   No results found. No diagnosis found.  MDM  The workup tonight reveals a 5 mm stone in the distal ureterovesicular junction along with slight bacteria in the urine. She is afebrile and appears nontoxic. She is feeling much better with medications given in the department and I believe she is appropriate for discharge. She will be sent home with antibiotics, pain medication, and a followup information for [facility] in case her stone does not pass between now and the end of the weekend. She was advised to return for high fevers, increasing pain, or vomiting with inability to keep her medications down.  Geoffery Lyons, MD 06/09/13 2139

## 2013-06-09 NOTE — ED Notes (Signed)
Pt vomited at triage.

## 2013-06-09 NOTE — Discharge Instructions (Signed)

## 2013-06-11 LAB — URINE CULTURE: Colony Count: >=100000

## 2013-06-12 ENCOUNTER — Telehealth (HOSPITAL_COMMUNITY): Payer: Self-pay | Admitting: Emergency Medicine

## 2013-06-12 NOTE — ED Notes (Signed)
Post ED Visit - Positive Culture Follow-up  Culture report reviewed by antimicrobial stewardship pharmacist: []  Wes Dulaney, Pharm.D., BCPS []  Celedonio Miyamoto, [address].D., BCPS []  Georgina Pillion, Pharm.D., BCPS []  [place], [address].D., BCPS, AAHIVP []  Estella Husk, Pharm.D., BCPS, AAHIVP [x]  Okey Regal, Pharm.D  Positive urine culture Treated with Cipro, organism sensitive to the same and no further patient follow-up is required at this time.  Peggy Thomas 06/12/2013, 4:37 PM

## 2013-06-28 ENCOUNTER — Other Ambulatory Visit: Payer: Self-pay | Admitting: Urology

## 2013-07-12 ENCOUNTER — Encounter (HOSPITAL_BASED_OUTPATIENT_CLINIC_OR_DEPARTMENT_OTHER): Payer: Self-pay | Admitting: *Deleted

## 2013-07-14 ENCOUNTER — Encounter (HOSPITAL_BASED_OUTPATIENT_CLINIC_OR_DEPARTMENT_OTHER): Payer: Self-pay | Admitting: *Deleted

## 2013-07-15 ENCOUNTER — Encounter (HOSPITAL_BASED_OUTPATIENT_CLINIC_OR_DEPARTMENT_OTHER): Payer: Self-pay | Admitting: *Deleted

## 2013-07-18 ENCOUNTER — Encounter (HOSPITAL_BASED_OUTPATIENT_CLINIC_OR_DEPARTMENT_OTHER): Payer: Self-pay | Admitting: *Deleted

## 2013-07-18 NOTE — Progress Notes (Signed)
NPO AFTER MN. ARRIVES AT 0645. NEEDS HG AND KUB. WILL TAKE SYNTHROID AND IF NEEDED OXYCODONE AM OF SURG W/ SIP OF WATER.

## 2013-07-20 NOTE — Progress Notes (Signed)
received call from pt . States she has head and chest cold and is asking if there are any meds that she can't take.  Pt reminded that she can take tylenol and cold meds as long as there is no asa compound or advil, aleve, ibuprofen.  Pt instructed to call surgeon if she has a fever the am of surgery. Pt verbalized her understanding.

## 2013-07-21 ENCOUNTER — Encounter: Payer: Self-pay | Admitting: Physician Assistant

## 2013-07-21 ENCOUNTER — Ambulatory Visit (INDEPENDENT_AMBULATORY_CARE_PROVIDER_SITE_OTHER): Payer: Private Health Insurance - Indemnity | Admitting: Physician Assistant

## 2013-07-21 VITALS — BP 116/88 | HR 77 | Temp 97.7°F | Resp 16 | Wt 191.8 lb

## 2013-07-21 DIAGNOSIS — J209 Acute bronchitis, unspecified: Secondary | ICD-10-CM | POA: Insufficient documentation

## 2013-07-21 MED ORDER — AZITHROMYCIN 250 MG PO TABS: ORAL_TABLET | ORAL | Status: DC

## 2013-07-21 NOTE — Patient Instructions (Signed)
Please drink plenty of fluids and get plenty of rest.  Mucinex as needed for congestion.  Saline nasal spray.  Daily probiotic.  Warm liquids and tylenol to soothe throat.  I will call you after I have discusses your CT findings with Dr. Rogelia Rohrer.     Acute Bronchitis You have acute bronchitis. This means you have a chest cold. The airways in your lungs are red and sore (inflamed). Acute means it is sudden onset.  CAUSES Bronchitis is most often caused by the same virus that causes a cold. SYMPTOMS   Body aches.  Chest congestion.  Chills.  Cough.  Fever.  Shortness of breath.  Sore throat. TREATMENT  Acute bronchitis is usually treated with rest, fluids, and medicines for relief of fever or cough. Most symptoms should go away after a few days or a week. Increased fluids may help thin your secretions and will prevent dehydration. Your caregiver may give you an inhaler to improve your symptoms. The inhaler reduces shortness of breath and helps control cough. You can take over-the-counter pain relievers or cough medicine to decrease coughing, pain, or fever. A cool-air vaporizer may help thin bronchial secretions and make it easier to clear your chest. Antibiotics are usually not needed but can be prescribed if you smoke, are seriously ill, have chronic lung problems, are elderly, or you are at higher risk for developing complications.Allergies and asthma can make bronchitis worse. Repeated episodes of bronchitis may cause longstanding lung problems. Avoid smoking and secondhand smoke.Exposure to cigarette smoke or irritating chemicals will make bronchitis worse. If you are a cigarette smoker, consider using nicotine gum or skin patches to help control withdrawal symptoms. Quitting smoking will help your lungs heal faster. Recovery from bronchitis is often slow, but you should start feeling better after 2 to 3 days. Cough from bronchitis frequently lasts for 3 to 4 weeks. To prevent  another bout of acute bronchitis:  Quit smoking.  Wash your hands frequently to get rid of viruses or use a hand sanitizer.  Avoid other people with cold or virus symptoms.  Try not to touch your hands to your mouth, nose, or eyes. SEEK IMMEDIATE MEDICAL CARE IF:  You develop increased fever, chills, or chest pain.  You have severe shortness of breath or bloody sputum.  You develop dehydration, fainting, repeated vomiting, or a severe headache.  You have no improvement after 1 week of treatment or you get worse. MAKE SURE YOU:   Understand these instructions.  Will watch your condition.  Will get help right away if you are not doing well or get worse. Document Released: 12/04/2004 Document Revised: 01/19/2012 Document Reviewed: 02/19/2011 [facility] Patient Information 2014 [place], [place].

## 2013-07-21 NOTE — Progress Notes (Signed)
Patient ID: Peggy Thomas, female   DOB: 05/13/1966, 47 y.o.   MRN: [id number]  Patient presents to clinic today c/o nasal congestion, sore throat, productive cough x 5 days.  Patient endorses fever 101 yesterday evening.  Has not checked temperature this am but states she has taken tylenol this am.  Denies shortness of breath, N/V/C/D, rash, sick contact.  Denies history of allergies or asthma.  Is concerned because she is scheduled for a surgical procedure on Monday.  Past Medical History  Diagnosis Date  . Hypocalcemia   . Depression 04/30/2011  . Hypothyroidism 04/30/2011  . History of kidney stones   . Right ureteral stone   . Chronic idiopathic thrombocytopenia     MILD--   HEMATOLOGIST--  DR Myna Hidalgo  . PONV (postoperative nausea and vomiting)     Current Outpatient Prescriptions on File Prior to Visit  Medication Sig Dispense Refill  . b complex vitamins tablet Take 1 tablet by mouth daily.      Marland Kitchen ibuprofen (ADVIL,MOTRIN) 600 MG tablet Take 600 mg by mouth Daily.      Marland Kitchen levothyroxine (SYNTHROID, LEVOTHROID) 50 MCG tablet Take 50 mcg by mouth daily before breakfast.      . oxyCODONE-acetaminophen (PERCOCET) 5-325 MG per tablet Take 2 tablets by mouth every 4 (four) hours as needed for pain.  25 tablet  0   No current facility-administered medications on file prior to visit.    No Known Allergies  Family History  Problem Relation Age of Onset  . Diabetes Mother     type 2  . Asthma Mother   . Cancer Mother     skin cancer  . Cancer Father     bone  . Mental illness Sister     schizophrenia  . Alcohol abuse Maternal Grandmother   . Stroke Maternal Grandmother   . Other Maternal Grandfather     black lung  . Alzheimer's disease Paternal Grandmother   . Stroke Maternal Aunt     History   Social History  . Marital Status: Married    Spouse Name: N/A    Number of Children: N/A  . Years of Education: N/A   Social History Main Topics  . Smoking status: Current  Every Day Smoker -- 1.00 packs/day for 30 years    Types: Cigarettes  . Smokeless tobacco: Never Used  . Alcohol Use: No     Comment: occasionally  . Drug Use: No  . Sexual Activity: None   Other Topics Concern  . None   Social History Narrative  . None   Review of Systems  Constitutional: Positive for fever, chills and malaise/fatigue.  HENT: Positive for congestion and sore throat.   Respiratory: Positive for cough and sputum production. Negative for hemoptysis, shortness of breath and wheezing.   Cardiovascular: Negative for chest pain and palpitations.  Gastrointestinal: Negative for nausea, vomiting, abdominal pain, diarrhea and constipation.  Musculoskeletal: Negative for myalgias.  Skin: Negative for rash.  Neurological: Negative for headaches.  Endo/Heme/Allergies: Negative for environmental allergies.   Filed Vitals:   07/21/13 1009  BP: 116/88  Pulse: 77  Temp: 97.7 F (36.5 C)  Resp: 16   Physical Exam  Vitals reviewed. Constitutional: She is oriented to person, place, and time and well-developed, well-nourished, and in no distress.  HENT:  Head: Normocephalic and atraumatic.  Right Ear: External ear normal.  Left Ear: External ear normal.  Nose: Nose normal.  Mouth/Throat: Oropharynx is clear and moist. No oropharyngeal exudate.  TM WNL bilaterally.  Eyes: Conjunctivae are normal. Pupils are equal, round, and reactive to light.  Neck: Normal range of motion. Neck supple.  Cardiovascular: Normal rate, regular rhythm and normal heart sounds.   Pulmonary/Chest: Effort normal and breath sounds normal. No respiratory distress. She has no wheezes. She has no rales.  Lymphadenopathy:    She has no cervical adenopathy.  Neurological: She is alert and oriented to person, place, and time.  Skin: Skin is warm and dry. No rash noted.     Recent Results (from the past 2160 hour(s))  URINALYSIS, ROUTINE W REFLEX MICROSCOPIC     Status: Abnormal   Collection Time     06/09/13  7:15 PM      Result Value Range   Color, Urine YELLOW  YELLOW   APPearance CLOUDY (*) CLEAR   Specific Gravity, Urine 1.023  1.005 - 1.030   pH 6.5  5.0 - 8.0   Glucose, UA NEGATIVE  NEGATIVE mg/dL   Hgb urine dipstick LARGE (*) NEGATIVE   Bilirubin Urine NEGATIVE  NEGATIVE   Ketones, ur 15 (*) NEGATIVE mg/dL   Protein, ur 30 (*) NEGATIVE mg/dL   Urobilinogen, UA 1.0  0.0 - 1.0 mg/dL   Nitrite POSITIVE (*) NEGATIVE   Leukocytes, UA SMALL (*) NEGATIVE  URINE MICROSCOPIC-ADD ON     Status: Abnormal   Collection Time    06/09/13  7:15 PM      Result Value Range   Squamous Epithelial / LPF FEW (*) RARE   WBC, UA 3-6  <3 WBC/hpf   RBC / HPF 11-20  <3 RBC/hpf   Bacteria, UA MANY (*) RARE  URINE CULTURE     Status: None   Collection Time    06/09/13  7:15 PM      Result Value Range   Specimen Description URINE, CLEAN CATCH     Special Requests NONE     Culture  Setup Time 06/10/2013 01:06     Colony Count >=100,000 COLONIES/ML     Culture ESCHERICHIA COLI     Report Status 06/11/2013 FINAL     Organism ID, Bacteria ESCHERICHIA COLI    CBC WITH DIFFERENTIAL     Status: Abnormal   Collection Time    06/09/13  8:08 PM      Result Value Range   WBC 10.7 (*) 4.0 - 10.5 K/uL   RBC 4.51  3.87 - 5.11 MIL/uL   Hemoglobin 14.4  12.0 - 15.0 g/dL   HCT 16.1  09.6 - 04.5 %   MCV 90.2  78.0 - 100.0 fL   MCH 31.9  26.0 - 34.0 pg   MCHC 35.4  30.0 - 36.0 g/dL   RDW 40.9  81.1 - 91.4 %   Platelets 118 (*) 150 - 400 K/uL   Comment: PLATELET COUNT CONFIRMED BY SMEAR   Neutrophils Relative % 74  43 - 77 %   Neutro Abs 7.9 (*) 1.7 - 7.7 K/uL   Lymphocytes Relative 20  12 - 46 %   Lymphs Abs 2.2  0.7 - 4.0 K/uL   Monocytes Relative 5  3 - 12 %   Monocytes Absolute 0.5  0.1 - 1.0 K/uL   Eosinophils Relative 1  0 - 5 %   Eosinophils Absolute 0.1  0.0 - 0.7 K/uL   Basophils Relative 0  0 - 1 %   Basophils Absolute 0.0  0.0 - 0.1 K/uL  BASIC METABOLIC PANEL     Status: Abnormal  Collection Time    06/09/13  8:08 PM      Result Value Range   Sodium 142  135 - 145 mEq/L   Potassium 4.0  3.5 - 5.1 mEq/L   Chloride 104  96 - 112 mEq/L   CO2 27  19 - 32 mEq/L   Glucose, Bld 118 (*) 70 - 99 mg/dL   BUN 11  6 - 23 mg/dL   Creatinine, Ser 9.56  0.50 - 1.10 mg/dL   Calcium 9.9  8.4 - 21.3 mg/dL   GFR calc non Af Amer 66 (*) >90 mL/min   GFR calc Af Amer 77 (*) >90 mL/min   Comment:            The eGFR has been calculated     using the CKD EPI equation.     This calculation has not been     validated in all clinical     situations.     eGFR's persistently     <90 mL/min signify     possible Chronic Kidney Disease.   Assessment/Plan: Acute bronchitis Rest. Fluids. Rx Azithromycin to be filled if symptoms worsen over weekend.  Saline nasal spray.  Daily probiotic.  Mucinex as needed.

## 2013-07-21 NOTE — Assessment & Plan Note (Signed)
Rest. Fluids. Rx Azithromycin to be filled if symptoms worsen over weekend.  Saline nasal spray.  Daily probiotic.  Mucinex as needed.

## 2013-07-22 ENCOUNTER — Telehealth: Payer: Self-pay | Admitting: Physician Assistant

## 2013-07-22 DIAGNOSIS — I728 Aneurysm of other specified arteries: Secondary | ICD-10-CM

## 2013-07-22 NOTE — Telephone Encounter (Signed)
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

## 2013-07-25 ENCOUNTER — Ambulatory Visit (HOSPITAL_BASED_OUTPATIENT_CLINIC_OR_DEPARTMENT_OTHER)
Admission: RE | Admit: 2013-07-25 | Discharge: 2013-07-25 | Disposition: A | Discharge status: Home / Self Care | Payer: Private Health Insurance - Indemnity | Source: Ambulatory Visit | Attending: Urology | Admitting: Urology

## 2013-07-25 ENCOUNTER — Ambulatory Visit (HOSPITAL_COMMUNITY): Payer: Private Health Insurance - Indemnity

## 2013-07-25 ENCOUNTER — Encounter (HOSPITAL_BASED_OUTPATIENT_CLINIC_OR_DEPARTMENT_OTHER): Payer: Self-pay | Admitting: *Deleted

## 2013-07-25 ENCOUNTER — Encounter (HOSPITAL_BASED_OUTPATIENT_CLINIC_OR_DEPARTMENT_OTHER)
Admission: RE | Disposition: A | Discharge status: Home / Self Care | Payer: Self-pay | Source: Ambulatory Visit | Attending: Urology

## 2013-07-25 ENCOUNTER — Ambulatory Visit (HOSPITAL_BASED_OUTPATIENT_CLINIC_OR_DEPARTMENT_OTHER): Payer: Private Health Insurance - Indemnity | Admitting: Anesthesiology

## 2013-07-25 ENCOUNTER — Encounter (HOSPITAL_BASED_OUTPATIENT_CLINIC_OR_DEPARTMENT_OTHER): Payer: Self-pay | Admitting: Anesthesiology

## 2013-07-25 DIAGNOSIS — Z6835 Body mass index (BMI) 35.0-35.9, adult: Secondary | ICD-10-CM | POA: Insufficient documentation

## 2013-07-25 DIAGNOSIS — E669 Obesity, unspecified: Secondary | ICD-10-CM | POA: Insufficient documentation

## 2013-07-25 DIAGNOSIS — J00 Acute nasopharyngitis [common cold]: Secondary | ICD-10-CM | POA: Insufficient documentation

## 2013-07-25 DIAGNOSIS — N200 Calculus of kidney: Secondary | ICD-10-CM | POA: Insufficient documentation

## 2013-07-25 DIAGNOSIS — F329 Major depressive disorder, single episode, unspecified: Secondary | ICD-10-CM | POA: Insufficient documentation

## 2013-07-25 DIAGNOSIS — E039 Hypothyroidism, unspecified: Secondary | ICD-10-CM | POA: Insufficient documentation

## 2013-07-25 DIAGNOSIS — D696 Thrombocytopenia, unspecified: Secondary | ICD-10-CM | POA: Insufficient documentation

## 2013-07-25 DIAGNOSIS — F3289 Other specified depressive episodes: Secondary | ICD-10-CM | POA: Insufficient documentation

## 2013-07-25 DIAGNOSIS — Z79899 Other long term (current) drug therapy: Secondary | ICD-10-CM | POA: Insufficient documentation

## 2013-07-25 DIAGNOSIS — N132 Hydronephrosis with renal and ureteral calculous obstruction: Secondary | ICD-10-CM

## 2013-07-25 DIAGNOSIS — N201 Calculus of ureter: Secondary | ICD-10-CM | POA: Insufficient documentation

## 2013-07-25 HISTORY — DX: Personal history of urinary calculi: Z87.442

## 2013-07-25 HISTORY — DX: Calculus of ureter: N20.1

## 2013-07-25 HISTORY — DX: Other specified postprocedural states: Z98.890

## 2013-07-25 HISTORY — DX: Immune thrombocytopenic purpura: D69.3

## 2013-07-25 HISTORY — DX: Nausea with vomiting, unspecified: R11.2

## 2013-07-25 HISTORY — PX: CYSTOSCOPY WITH RETROGRADE PYELOGRAM, URETEROSCOPY AND STENT PLACEMENT: SHX5789

## 2013-07-25 SURGERY — CYSTOURETEROSCOPY, WITH RETROGRADE PYELOGRAM AND STENT INSERTION
Anesthesia: General | Site: Ureter | Laterality: Right | Wound class: Clean Contaminated

## 2013-07-25 MED ORDER — CIPROFLOXACIN IN D5W 400 MG/200ML IV SOLN
400.0000 mg | INTRAVENOUS | Status: AC
  Administered 2013-07-25: 400 mg via INTRAVENOUS
  Filled 2013-07-25: qty 200

## 2013-07-25 MED ORDER — FENTANYL CITRATE 0.05 MG/ML IJ SOLN
INTRAMUSCULAR | Status: DC | PRN
  Administered 2013-07-25 (×2): 50 ug via INTRAVENOUS

## 2013-07-25 MED ORDER — PROMETHAZINE HCL 25 MG/ML IJ SOLN
6.2500 mg | INTRAMUSCULAR | Status: DC | PRN
  Filled 2013-07-25: qty 1

## 2013-07-25 MED ORDER — DEXAMETHASONE SODIUM PHOSPHATE 4 MG/ML IJ SOLN
INTRAMUSCULAR | Status: DC | PRN
  Administered 2013-07-25: 10 mg via INTRAVENOUS

## 2013-07-25 MED ORDER — OXYCODONE HCL 5 MG/5ML PO SOLN
5.0000 mg | Freq: Once | ORAL | Status: DC | PRN
  Filled 2013-07-25: qty 5

## 2013-07-25 MED ORDER — TRIMETHOPRIM 100 MG PO TABS: 100.0000 mg | ORAL_TABLET | ORAL | Status: DC

## 2013-07-25 MED ORDER — HYDROMORPHONE HCL PF 1 MG/ML IJ SOLN
0.2500 mg | INTRAMUSCULAR | Status: DC | PRN
  Filled 2013-07-25: qty 1

## 2013-07-25 MED ORDER — BELLADONNA ALKALOIDS-OPIUM 16.2-60 MG RE SUPP
RECTAL | Status: DC | PRN
  Administered 2013-07-25: 1 via RECTAL

## 2013-07-25 MED ORDER — PROPOFOL 10 MG/ML IV BOLUS
INTRAVENOUS | Status: DC | PRN
  Administered 2013-07-25: 200 mg via INTRAVENOUS

## 2013-07-25 MED ORDER — PHENAZOPYRIDINE HCL 200 MG PO TABS: 200.0000 mg | ORAL_TABLET | Freq: Three times a day (TID) | ORAL | Status: DC | PRN

## 2013-07-25 MED ORDER — METOCLOPRAMIDE HCL 5 MG/ML IJ SOLN
INTRAMUSCULAR | Status: DC | PRN
  Administered 2013-07-25: 10 mg via INTRAVENOUS

## 2013-07-25 MED ORDER — ACETAMINOPHEN 10 MG/ML IV SOLN
INTRAVENOUS | Status: DC | PRN
  Administered 2013-07-25: 1000 mg via INTRAVENOUS

## 2013-07-25 MED ORDER — LIDOCAINE HCL (CARDIAC) 20 MG/ML IV SOLN
INTRAVENOUS | Status: DC | PRN
  Administered 2013-07-25: 100 mg via INTRAVENOUS

## 2013-07-25 MED ORDER — OXYCODONE HCL 5 MG PO TABS
5.0000 mg | ORAL_TABLET | Freq: Once | ORAL | Status: DC | PRN
  Filled 2013-07-25: qty 1

## 2013-07-25 MED ORDER — ONDANSETRON HCL 4 MG/2ML IJ SOLN
INTRAMUSCULAR | Status: DC | PRN
  Administered 2013-07-25: 4 mg via INTRAVENOUS

## 2013-07-25 MED ORDER — SODIUM CHLORIDE 0.9 % IR SOLN
Status: DC | PRN
  Administered 2013-07-25: 6000 mL

## 2013-07-25 MED ORDER — OXYCODONE-ACETAMINOPHEN 5-325 MG PO TABS: 1.0000 | ORAL_TABLET | ORAL | Status: DC | PRN

## 2013-07-25 MED ORDER — MIDAZOLAM HCL 5 MG/5ML IJ SOLN
INTRAMUSCULAR | Status: DC | PRN
  Administered 2013-07-25: 2 mg via INTRAVENOUS

## 2013-07-25 MED ORDER — GLYCOPYRROLATE 0.2 MG/ML IJ SOLN
INTRAMUSCULAR | Status: DC | PRN
  Administered 2013-07-25: 0.2 mg via INTRAVENOUS

## 2013-07-25 MED ORDER — MEPERIDINE HCL 25 MG/ML IJ SOLN
6.2500 mg | INTRAMUSCULAR | Status: DC | PRN
  Filled 2013-07-25: qty 1

## 2013-07-25 MED ORDER — IOHEXOL 350 MG/ML SOLN
INTRAVENOUS | Status: DC | PRN
  Administered 2013-07-25: 2 mL via INTRAVENOUS

## 2013-07-25 MED ORDER — KETOROLAC TROMETHAMINE 30 MG/ML IJ SOLN
INTRAMUSCULAR | Status: DC | PRN
  Administered 2013-07-25: 30 mg via INTRAVENOUS

## 2013-07-25 MED ORDER — LACTATED RINGERS IV SOLN
INTRAVENOUS | Status: DC
  Administered 2013-07-25 (×2): via INTRAVENOUS
  Filled 2013-07-25: qty 1000

## 2013-07-25 SURGICAL SUPPLY — 33 items
ADAPTER CATH URET PLST 4-6FR (CATHETERS) ×2 IMPLANT
BAG DRAIN URO-CYSTO SKYTR STRL (DRAIN) ×2 IMPLANT
BASKET LASER NITINOL 1.9FR (BASKET) IMPLANT
BASKET STNLS GEMINI 4WIRE 3FR (BASKET) ×2 IMPLANT
BASKET ZERO TIP NITINOL 2.4FR (BASKET) IMPLANT
BOOTIES KNEE HIGH SLOAN (MISCELLANEOUS) ×2 IMPLANT
BRUSH URET BIOPSY 3F (UROLOGICAL SUPPLIES) IMPLANT
CANISTER SUCT LVC 12 LTR MEDI- (MISCELLANEOUS) ×2 IMPLANT
CATH CLEAR GEL 3F BACKSTOP (CATHETERS) ×2 IMPLANT
CATH INTERMIT  6FR 70CM (CATHETERS) ×2 IMPLANT
CATH URET 5FR 28IN CONE TIP (BALLOONS)
CATH URET 5FR 28IN OPEN ENDED (CATHETERS) IMPLANT
CATH URET 5FR 70CM CONE TIP (BALLOONS) IMPLANT
CATH URET DUAL LUMEN 6-10FR 50 (CATHETERS) IMPLANT
CLOTH BEACON ORANGE TIMEOUT ST (SAFETY) ×2 IMPLANT
DRAPE CAMERA CLOSED 9X96 (DRAPES) ×2 IMPLANT
ELECT REM PT RETURN 9FT ADLT (ELECTROSURGICAL)
ELECTRODE REM PT RTRN 9FT ADLT (ELECTROSURGICAL) IMPLANT
GLOVE BIO SURGEON STRL SZ7 (GLOVE) ×2 IMPLANT
GOWN PREVENTION PLUS LG XLONG (DISPOSABLE) ×2 IMPLANT
GOWN STRL REIN XL XLG (GOWN DISPOSABLE) ×2 IMPLANT
GUIDEWIRE 0.038 PTFE COATED (WIRE) IMPLANT
GUIDEWIRE ANG ZIPWIRE 038X150 (WIRE) IMPLANT
GUIDEWIRE STR DUAL SENSOR (WIRE) ×4 IMPLANT
IV NS IRRIG 3000ML ARTHROMATIC (IV SOLUTION) ×4 IMPLANT
KIT BALLIN UROMAX 15FX10 (LABEL) IMPLANT
KIT BALLN UROMAX 15FX4 (MISCELLANEOUS) IMPLANT
KIT BALLN UROMAX 26 75X4 (MISCELLANEOUS)
SET HIGH PRES BAL DIL (LABEL)
SHEATH ACCESS URETERAL 38CM (SHEATH) IMPLANT
SHEATH ACCESS URETERAL 54CM (SHEATH) IMPLANT
STENT CONTOUR 6FRX24X.038 (STENTS) IMPLANT
STENT URET 6FRX24 CONTOUR (STENTS) ×2 IMPLANT

## 2013-07-25 NOTE — Op Note (Signed)
Pre-operative diagnosis :   5 mm impacted left ureterovesical junction stone  Postoperative diagnosis:   same  Operation:  Cystourethroscopy, left retrograde pyelogram with interpretation, placement of Backstop , laser fragmentation of multifaceted impacted left distal ureteral calculus, basket extraction of stone fragments, placement of 6 French by 24 cm left double-J stent(suture intact).  Surgeon:  Kathie Rhodes. Patsi Sears, MD  First assistant:  None  Anesthesia:  General LMA  Preparation:  After appropriate preanesthesia, the patient was brought to the operating room, placed on the operating table in the dorsal supine position where general LMA anesthesia was introduced. She was replaced in the dorsal lithotomy position with pubis was prepped with Betadine solution and draped in usual fashion. The history was reviewed. The CT scan was reviewed. Armband was double checked.  Review history:Problems  1. Distal Ureteral Stone On The Right 592.1  2. Nephrolithiasis Of The Left Kidney 592.0  History of Present Illness  47 yo married female presents today after being seen in the ER on 06/09/13 for Rt flank & RLQ abdominal pain. CT showed a 5mm obstructing Rt distal UVJ stone with Rt hydronephrosis. She also had multiple Lt renal stones. She had both nausea and vomiting, but this has resolved. She was given pain med, but no Flomax or strainer.      Statement of  Likelihood of Success: Excellent. TIME-OUT observed.:  Procedure:  Cystourethroscopy was accomplished, which showed normal bladder, and normal appearing trigone. Clear eflux was seen from the right ureteral orifice. Remaining bladder has no evidence of bladder stone, tumor, or diverticular formation.  Left retrograde pyelogram was performed, which showed a multifaceted stone at the left ureterovesical junction. There appeared to be mild proximal left hydronephrosis. He a sensor or 0.038 guidewire was then placed around the stone into the left  renal pelvis and coiled. A second wire was also placed, but when the short ureteroscope was placed, there was no room in the ureteral orifice. Therefore the second wire was removed. The short 6 French ureteroscope was placed. The ureterovesical junction was identified. The intravesical, was quite narrow, and ureterovesical junction was quite tight. The scope was manipulated through the tight UV junction, and the multifaceted stone was identified. The stone itself was rectangular, multifaceted, with rolled edges. The 0 tip basket was placed around the stone, but the stone could not be manipulated through the tight uterovesical junction. The stone was released. Backstop was then placed above the stone, and following placement of the Backstop catheter and the Backstop fluid, the 200  laser fiber was placed, and with settings of 0.5/5, the stone was fragmented into 3 pieces. These pieces were then basket extracted. Blood clot was also extracted. Because of dilation and surgical manipulation within the ureter, elected to leave a double-J stent. A 6/24 stent was then placed, over the safety wire into the renal pelvis. This was coiled in the renal pelvis and coiled in the bladder. The suture was left on the bottom of the coil. The scope was removed. Fluoroscopy was used to ensure that the double-J was in proper position and but the kidney and the bladder.   The patient received IV Tylenol, and IV Toradol. She also received B. and O. suppository. She was awakened, and taken to recovery room in good condition.

## 2013-07-25 NOTE — H&P (Signed)
hief Complaint  cc: Dr. Abner Greenspan, [facility]   Reason For Visit  Kidney stone   Active Problems Problems  1. Distal Ureteral Stone On The Right 592.1 2. Nephrolithiasis Of The Left Kidney 592.0  History of Present Illness        47 yo married female presents today after being seen in the ER on 06/09/13 for Rt flank & RLQ abdominal pain.  CT showed a 5mm obstructing Rt distal UVJ stone with Rt hydronephrosis.  She also had multiple Lt renal stones. She had both nausea and vomiting, but this has resolved. She was given pain med, but no Flomax or strainer.   Past Medical History Problems  1. History of  Hypercalcemia 275.42 2. History of  Hypothyroidism 244.9 3. History of  Thrombocytopenia 287.5  Surgical History Problems  1. History of  Cesarean Section 2. History of  Cesarean Section 3. History of  Cholecystectomy 4. History of  Hysterectomy V45.77 5. History of  Knee Arthroscopy 6. History of  Tubal Ligation V25.2  Current Meds 1. Cipro 500 MG Oral Tablet; Therapy: (Recorded:05Aug2014) to 2. Levothyroxine Sodium 50 MCG Oral Tablet; Therapy: (Recorded:05Aug2014) to 3. Percocet TABS; Therapy: (Recorded:05Aug2014) to  Allergies Medication  1. No Known Drug Allergies  Family History Problems  1. Maternal grandmother's history of  Alcoholism 2. Paternal grandmother's history of  Alzheimer's Disease 3. Maternal history of  Asthma V17.5 4. Paternal history of  Bone Cancer 5. Maternal grandmother's history of  Coal Workers' Pneumoconiosis 6. Maternal history of  Diabetes Mellitus V18.0 7. Family history of  Family Health Status Number Of Children 1 son, 1 daughter 23. Family history of  Nephrolithiasis 9. Sororal history of  Schizophrenia 10. Maternal history of  Skin Cancer V16.8 11. Maternal grandmother's history of  Stroke Syndrome V17.1 12. Maternal aunt's history of  Stroke Syndrome V17.1  Social History Problems    Alcohol Use occasionally   Caffeine  Use 4/day   Marital History - Currently Married   Smoking Cigarettes 305.1 1 ppd x 88yrs  Review of Systems Genitourinary, constitutional, skin, eye, otolaryngeal, hematologic/lymphatic, cardiovascular, pulmonary, endocrine, musculoskeletal, gastrointestinal, neurological and psychiatric system(s) were reviewed and pertinent findings if present are noted.  Genitourinary: urinary frequency and hematuria.  Gastrointestinal: nausea, vomiting, flank pain, abdominal pain and constipation.  ENT: sore throat.  Hematologic/Lymphatic: a tendency to easily bruise and swollen glands.  Musculoskeletal: back pain.    Vitals Vital Signs [Data Includes: Last 1 Day]  05Aug2014 11:27AM  BMI Calculated: 36.62 BSA Calculated: 1.9 Height: 5 ft 2 in Weight: 199 lb  Blood Pressure: 144 / 83 Temperature: 98.4 F Heart Rate: 51  Physical Exam Constitutional: Well nourished and well developed . No acute distress.  ENT:. The ears and nose are normal in appearance.  Cardiovascular: Heart rate and rhythm are normal . No peripheral edema.  Abdomen: The abdomen is obese. The abdomen is soft and nontender. No masses are palpated. No CVA tenderness. No hernias are palpable. No hepatosplenomegaly noted.  Genitourinary:  Chaperone Present: .  Examination of the external genitalia shows normal female external genitalia and no lesions. The urethra is normal in appearance and not tender. There is no urethral mass. Vaginal exam demonstrates no abnormalities. The adnexa are palpably normal. The bladder is non tender and not distended. The anus is normal on inspection. The perineum is normal on inspection.  Lymphatics: The femoral and inguinal nodes are not enlarged or tender.  Skin: Normal skin turgor, no visible rash and no  visible skin lesions.  Neuro/Psych:. Mood and affect are appropriate.    Results/Data Urine [Data Includes: Last 1 Day]   05Aug2014  COLOR YELLOW   APPEARANCE CLEAR   SPECIFIC GRAVITY 1.020    pH 5.5   GLUCOSE NEG mg/dL  BILIRUBIN NEG   KETONE NEG mg/dL  BLOOD SMALL   PROTEIN NEG mg/dL  UROBILINOGEN 0.2 mg/dL  NITRITE NEG   LEUKOCYTE ESTERASE NEG   SQUAMOUS EPITHELIAL/HPF FEW   WBC 0-2 WBC/hpf  RBC 0-2 RBC/hpf  BACTERIA NONE SEEN   CRYSTALS NONE SEEN   CASTS NONE SEEN    Assessment Assessed  1. Nephrolithiasis Of The Left Kidney 592.0 2. Distal Ureteral Stone On The Right 592.1   Acute problem is the R distal stone in pt with strong family history of stone disease, and who has multiple L sided kidney stones, needing eventual lithotripsy. She will have toradol, flomax, and strainer today, and RTC for NP f/u. If she does not pass stone, she wil need basket extreaction.   Plan Distal Ureteral Stone On The Right (592.1)  1. Ketorolac Tromethamine 60 MG/2ML Injection Solution; INJECT 60  MG Intramuscular; To Be  Done: 05Aug2014; Status: HOLD FOR - Administration 2. Tamsulosin HCl 0.4 MG Oral Capsule; TAKE 1 CAPSULE Bedtime; Therapy: 05Aug2014 to  (Evaluate:31Jul2015); Last Rx:05Aug2014 3. Follow-up NP/PA Office  Follow-up  Requested for: 05Aug2014 Health Maintenance (V70.0)  4. UA With REFLEX  Done: 05Aug2014 09:48AM   1. Toradol today 2. Flomax 3. Strainer.  4. RTC 1week for NP f/u. If she does not pass stone, she wil need basket esxtraction.  5. Eventual L lithotripsy.   Signatures Electronically signed by : Jethro Bolus, M.D.; Jun 14 2013 12:03PM

## 2013-07-25 NOTE — Anesthesia Postprocedure Evaluation (Signed)
Anesthesia Post Note  Patient: Peggy Thomas  Procedure(s) Performed: Procedure(s) (LRB): CYSTOSCOPY WITH RETROGRADE PYELOGRAM, URETEROSCOPY, STONE EXTRACTION AND POSSIBLE STENT PLACEMENT (Right)  Anesthesia type: General  Patient location: PACU  Post pain: Pain level controlled  Post assessment: Post-op Vital signs reviewed  Last Vitals: BP 112/89  Pulse 60  Temp(Src) 36.2 C (Oral)  Resp 10  Ht 5\' 2"  (1.575 m)  Wt 191 lb (86.637 kg)  BMI 34.93 kg/m2  SpO2 95%  Post vital signs: Reviewed  Level of consciousness: sedated  Complications: No apparent anesthesia complications

## 2013-07-25 NOTE — Anesthesia Procedure Notes (Signed)
Procedure Name: LMA Insertion Date/Time: 07/25/2013 8:46 AM Performed by: Norva Pavlov Pre-anesthesia Checklist: Patient identified, Emergency Drugs available, Suction available and Patient being monitored Patient Re-evaluated:Patient Re-evaluated prior to inductionOxygen Delivery Method: Circle System Utilized Preoxygenation: Pre-oxygenation with 100% oxygen Intubation Type: IV induction Ventilation: Mask ventilation without difficulty LMA: LMA inserted LMA Size: 4.0 Number of attempts: 1 Airway Equipment and Method: bite block Placement Confirmation: positive ETCO2 Tube secured with: Tape Dental Injury: Teeth and Oropharynx as per pre-operative assessment

## 2013-07-25 NOTE — Anesthesia Preprocedure Evaluation (Addendum)
Anesthesia Evaluation  Patient identified by MRN, date of birth, ID band Patient awake    Reviewed: Allergy & Precautions, H&P , NPO status , Patient's Chart, lab work & pertinent test results  History of Anesthesia Complications (+) PONV  Airway Mallampati: II TM Distance: >3 FB Neck ROM: Full    Dental  (+) Dental Advisory Given and Teeth Intact   Pulmonary neg pulmonary ROS,  breath sounds clear to auscultation        Cardiovascular negative cardio ROS  Rhythm:Regular Rate:Normal     Neuro/Psych PSYCHIATRIC DISORDERS Depression negative neurological ROS     GI/Hepatic negative GI ROS, Neg liver ROS,   Endo/Other  Hypothyroidism   Renal/GU negative Renal ROS     Musculoskeletal negative musculoskeletal ROS (+)   Abdominal (+) + obese,   Peds  Hematology  (+) Blood dyscrasia, ,   Anesthesia Other Findings   Reproductive/Obstetrics negative OB ROS                          Anesthesia Physical Anesthesia Plan  ASA: II  Anesthesia Plan: General   Post-op Pain Management:    Induction: Intravenous  Airway Management Planned: LMA  Additional Equipment:   Intra-op Plan:   Post-operative Plan: Extubation in OR  Informed Consent: I have reviewed the patients History and Physical, chart, labs and discussed the procedure including the risks, benefits and alternatives for the proposed anesthesia with the patient or authorized representative who has indicated his/her understanding and acceptance.   Dental advisory given  Plan Discussed with: CRNA  Anesthesia Plan Comments:         Anesthesia Quick Evaluation

## 2013-07-25 NOTE — Interval H&P Note (Signed)
History and Physical Interval Note:  07/25/2013 8:26 AM  [name]  has presented today for surgery, with the diagnosis of RIGHT URETERAL STONE  The various methods of treatment have been discussed with the patient and family. After consideration of risks, benefits and other options for treatment, the patient has consented to  Procedure(s): CYSTOSCOPY WITH RETROGRADE PYELOGRAM, URETEROSCOPY, STONE EXTRACTION AND POSSIBLE STENT PLACEMENT (Right) as a surgical intervention .  The patient's history has been reviewed, patient examined, no change in status, stable for surgery.  I have reviewed the patient's chart and labs.  Questions were answered to the patient's satisfaction.     Peggy Thomas I

## 2013-07-25 NOTE — Transfer of Care (Signed)
Immediate Anesthesia Transfer of Care Note  Patient: Peggy Thomas  Procedure(s) Performed: Procedure(s) (LRB): CYSTOSCOPY WITH RETROGRADE PYELOGRAM, URETEROSCOPY, STONE EXTRACTION AND POSSIBLE STENT PLACEMENT (Right)  Patient Location: PACU  Anesthesia Type: General  Level of Consciousness: awake, alert  and oriented  Airway & Oxygen Therapy: Patient Spontanous Breathing and Patient connected to face mask oxygen  Post-op Assessment: Report given to PACU RN and Post -op Vital signs reviewed and stable  Post vital signs: Reviewed and stable  Complications: No apparent anesthesia complications

## 2013-07-25 NOTE — Telephone Encounter (Signed)
LMOM with contact name and number [for return call, if needed] RE: Referral and further provider instructions/SLS

## 2013-07-26 ENCOUNTER — Encounter (HOSPITAL_BASED_OUTPATIENT_CLINIC_OR_DEPARTMENT_OTHER): Payer: Self-pay | Admitting: Urology

## 2013-07-27 ENCOUNTER — Emergency Department (HOSPITAL_COMMUNITY)
Admission: EM | Admit: 2013-07-27 | Discharge: 2013-07-27 | Disposition: A | Discharge status: Home / Self Care | Payer: Private Health Insurance - Indemnity | Attending: Emergency Medicine | Admitting: Emergency Medicine

## 2013-07-27 ENCOUNTER — Encounter (HOSPITAL_COMMUNITY): Payer: Self-pay | Admitting: Emergency Medicine

## 2013-07-27 DIAGNOSIS — Z87442 Personal history of urinary calculi: Secondary | ICD-10-CM | POA: Insufficient documentation

## 2013-07-27 DIAGNOSIS — E039 Hypothyroidism, unspecified: Secondary | ICD-10-CM | POA: Insufficient documentation

## 2013-07-27 DIAGNOSIS — R6883 Chills (without fever): Secondary | ICD-10-CM | POA: Insufficient documentation

## 2013-07-27 DIAGNOSIS — Z79899 Other long term (current) drug therapy: Secondary | ICD-10-CM | POA: Insufficient documentation

## 2013-07-27 DIAGNOSIS — Z862 Personal history of diseases of the blood and blood-forming organs and certain disorders involving the immune mechanism: Secondary | ICD-10-CM | POA: Insufficient documentation

## 2013-07-27 DIAGNOSIS — Z8659 Personal history of other mental and behavioral disorders: Secondary | ICD-10-CM | POA: Insufficient documentation

## 2013-07-27 DIAGNOSIS — Z9889 Other specified postprocedural states: Secondary | ICD-10-CM | POA: Insufficient documentation

## 2013-07-27 DIAGNOSIS — N23 Unspecified renal colic: Secondary | ICD-10-CM

## 2013-07-27 DIAGNOSIS — F17200 Nicotine dependence, unspecified, uncomplicated: Secondary | ICD-10-CM | POA: Insufficient documentation

## 2013-07-27 DIAGNOSIS — Z8639 Personal history of other endocrine, nutritional and metabolic disease: Secondary | ICD-10-CM | POA: Insufficient documentation

## 2013-07-27 DIAGNOSIS — D693 Immune thrombocytopenic purpura: Secondary | ICD-10-CM | POA: Insufficient documentation

## 2013-07-27 DIAGNOSIS — R110 Nausea: Secondary | ICD-10-CM | POA: Insufficient documentation

## 2013-07-27 LAB — URINALYSIS, ROUTINE W REFLEX MICROSCOPIC
Bilirubin Urine: NEGATIVE
Glucose, UA: NEGATIVE mg/dL
Ketones, ur: NEGATIVE mg/dL
Nitrite: NEGATIVE
Protein, ur: 100 mg/dL — AB
Specific Gravity, Urine: 1.019 (ref 1.005–1.030)
Urobilinogen, UA: 0.2 mg/dL (ref 0.0–1.0)
pH: 7 (ref 5.0–8.0)

## 2013-07-27 LAB — BASIC METABOLIC PANEL WITH GFR
BUN: 17 mg/dL (ref 6–23)
CO2: 24 meq/L (ref 19–32)
Calcium: 9.6 mg/dL (ref 8.4–10.5)
Chloride: 100 meq/L (ref 96–112)
Creatinine, Ser: 1.19 mg/dL — ABNORMAL HIGH (ref 0.50–1.10)
GFR calc Af Amer: 62 mL/min — ABNORMAL LOW
GFR calc non Af Amer: 53 mL/min — ABNORMAL LOW
Glucose, Bld: 105 mg/dL — ABNORMAL HIGH (ref 70–99)
Potassium: 4.2 meq/L (ref 3.5–5.1)
Sodium: 137 meq/L (ref 135–145)

## 2013-07-27 LAB — CBC WITH DIFFERENTIAL/PLATELET
Basophils Absolute: 0 10*3/uL (ref 0.0–0.1)
Basophils Relative: 0 % (ref 0–1)
Eosinophils Absolute: 0.1 10*3/uL (ref 0.0–0.7)
Eosinophils Relative: 1 % (ref 0–5)
HCT: 42 % (ref 36.0–46.0)
Hemoglobin: 15 g/dL (ref 12.0–15.0)
Lymphocytes Relative: 34 % (ref 12–46)
Lymphs Abs: 3.6 10*3/uL (ref 0.7–4.0)
MCH: 31.5 pg (ref 26.0–34.0)
MCHC: 35.7 g/dL (ref 30.0–36.0)
MCV: 88.2 fL (ref 78.0–100.0)
Monocytes Absolute: 0.5 10*3/uL (ref 0.1–1.0)
Monocytes Relative: 4 % (ref 3–12)
Neutro Abs: 6.3 10*3/uL (ref 1.7–7.7)
Neutrophils Relative %: 60 % (ref 43–77)
Platelets: 136 10*3/uL — ABNORMAL LOW (ref 150–400)
RBC: 4.76 MIL/uL (ref 3.87–5.11)
RDW: 12.2 % (ref 11.5–15.5)
WBC: 10.6 10*3/uL — ABNORMAL HIGH (ref 4.0–10.5)

## 2013-07-27 LAB — URINE MICROSCOPIC-ADD ON

## 2013-07-27 MED ORDER — SODIUM CHLORIDE 0.9 % IV SOLN
1000.0000 mL | Freq: Once | INTRAVENOUS | Status: AC
  Administered 2013-07-27: 1000 mL via INTRAVENOUS

## 2013-07-27 MED ORDER — HYDROMORPHONE HCL PF 1 MG/ML IJ SOLN
1.0000 mg | INTRAMUSCULAR | Status: DC | PRN
  Administered 2013-07-27: 1 mg via INTRAVENOUS
  Filled 2013-07-27: qty 1

## 2013-07-27 MED ORDER — ONDANSETRON HCL 4 MG/2ML IJ SOLN
4.0000 mg | Freq: Once | INTRAMUSCULAR | Status: AC
  Administered 2013-07-27: 4 mg via INTRAVENOUS
  Filled 2013-07-27: qty 2

## 2013-07-27 MED ORDER — SODIUM CHLORIDE 0.9 % IV SOLN: 1000.0000 mL | INTRAVENOUS | Status: DC

## 2013-07-27 NOTE — ED Notes (Addendum)
Pt c/o RLQ pain and nausea, severe x 1 hour. Pt states she had kidney stone removed Monday, removed stent per MD order tonight at home, now pain worse. Pt did take 2 Oxycodone PTA

## 2013-07-27 NOTE — ED Provider Notes (Signed)
CSN: [id number]     Arrival date & time 07/27/13  2113 History   First MD Initiated Contact with Patient 07/27/13 2125     Chief Complaint  Patient presents with  . Abdominal Pain    HPI Patient presents to the emergency room with complaints of flank pain and nausea. Patient has a history of kidney stones. She had a procedure performed on Monday and had a renal stent placed. Patient states today she started having some chills. She called the urologist's office and was told to remove her stent today which was planned day for removal. Patient states she remove the stent this evening and about a half hour later she had significant pain in the right flank associated with nausea. This feels similar to when she had a kidney stone. She denies any trouble with vomiting or diarrhea. Past Medical History  Diagnosis Date  . Hypocalcemia   . Depression 04/30/2011  . Hypothyroidism 04/30/2011  . History of kidney stones   . Right ureteral stone   . Chronic idiopathic thrombocytopenia     MILD--   HEMATOLOGIST--  DR Myna Hidalgo  . PONV (postoperative nausea and vomiting)    Past Surgical History  Procedure Laterality Date  . Cholecystectomy  2000  . Cesarean section  1996 and 1998  . Tubal ligation  1999  . Knee arthroscopy    . Robotic assisted total hysterectomy  10-15-2010    LAPAROSCOPIC /  LYSIS ADHESIONS  . Extracorporeal shock wave lithotripsy  2003  . Cystoscopy with retrograde pyelogram, ureteroscopy and stent placement Right 07/25/2013    Procedure: CYSTOSCOPY WITH RETROGRADE PYELOGRAM, URETEROSCOPY, STONE EXTRACTION AND POSSIBLE STENT PLACEMENT;  Surgeon: Kathi Ludwig, MD;  Location: [facility];  Service: Urology;  Laterality: Right;   Family History  Problem Relation Age of Onset  . Diabetes Mother     type 2  . Asthma Mother   . Cancer Mother     skin cancer  . Cancer Father     bone  . Mental illness Sister     schizophrenia  . Alcohol abuse Maternal  Grandmother   . Stroke Maternal Grandmother   . Other Maternal Grandfather     black lung  . Alzheimer's disease Paternal Grandmother   . Stroke Maternal Aunt    History  Substance Use Topics  . Smoking status: Current Every Day Smoker -- 1.00 packs/day for 30 years    Types: Cigarettes  . Smokeless tobacco: Never Used  . Alcohol Use: No     Comment: occasionally   OB History   Grav Para Term Preterm Abortions TAB SAB Ect Mult Living                 Review of Systems  All other systems reviewed and are negative.    Allergies  Review of patient's allergies indicates no known allergies.  Home Medications   Current Outpatient Rx  Name  Route  Sig  Dispense  Refill  . b complex vitamins tablet   Oral   Take 1 tablet by mouth daily.         Marland Kitchen levothyroxine (SYNTHROID, LEVOTHROID) 50 MCG tablet   Oral   Take 50 mcg by mouth daily before breakfast.         . oxyCODONE-acetaminophen (PERCOCET) 5-325 MG per tablet   Oral   Take 2 tablets by mouth every 4 (four) hours as needed for pain.   25 tablet   0   .  phenazopyridine (PYRIDIUM) 200 MG tablet   Oral   Take 1 tablet (200 mg total) by mouth 3 (three) times daily as needed for pain.   30 tablet   0   . trimethoprim (TRIMPEX) 100 MG tablet   Oral   Take 100 mg by mouth daily. For 3 days only         . ibuprofen (ADVIL,MOTRIN) 600 MG tablet   Oral   Take 600 mg by mouth every 8 (eight) hours as needed for pain.           BP 120/66  Pulse 68  Temp(Src) 98.4 F (36.9 C) (Oral)  Resp 16  Ht 5\' 2"  (1.575 m)  Wt 191 lb (86.637 kg)  BMI 34.93 kg/m2  SpO2 98% Physical Exam  Nursing note and vitals reviewed. Constitutional: She appears well-developed and well-nourished. No distress.  HENT:  Head: Normocephalic and atraumatic.  Right Ear: External ear normal.  Left Ear: External ear normal.  Eyes: Conjunctivae are normal. Right eye exhibits no discharge. Left eye exhibits no discharge. No scleral  icterus.  Neck: Neck supple. No tracheal deviation present.  Cardiovascular: Normal rate, regular rhythm and intact distal pulses.   Pulmonary/Chest: Effort normal and breath sounds normal. No stridor. No respiratory distress. She has no wheezes. She has no rales.  Abdominal: Soft. Bowel sounds are normal. She exhibits no distension. There is no tenderness. There is CVA tenderness. There is no rebound and no guarding.  Musculoskeletal: She exhibits no edema and no tenderness.  Neurological: She is alert. She has normal strength. No sensory deficit. Cranial nerve deficit:  no gross defecits noted. She exhibits normal muscle tone. She displays no seizure activity. Coordination normal.  Skin: Skin is warm and dry. No rash noted.  Psychiatric: She has a normal mood and affect.   Reviewed chart:  Procedure performed Cystourethroscopy, left retrograde pyelogram with interpretation, placement of Backstop , laser fragmentation of multifaceted impacted left distal ureteral calculus, basket extraction of stone fragments, placement of 6 French by 24 cm left double-J stent(suture intact).  ED Course  Procedures (including critical care time) Labs Review Labs Reviewed  URINALYSIS, ROUTINE W REFLEX MICROSCOPIC - Abnormal; Notable for the following:    Color, Urine RED (*)    APPearance TURBID (*)    Hgb urine dipstick LARGE (*)    Protein, ur 100 (*)    Leukocytes, UA MODERATE (*)    All other components within normal limits  CBC WITH DIFFERENTIAL - Abnormal; Notable for the following:    WBC 10.6 (*)    Platelets 136 (*)    All other components within normal limits  BASIC METABOLIC PANEL - Abnormal; Notable for the following:    Glucose, Bld 105 (*)    Creatinine, Ser 1.19 (*)    GFR calc non Af Amer 53 (*)    GFR calc Af Amer 62 (*)    All other components within normal limits  URINE MICROSCOPIC-ADD ON   Imaging Review No results found. Medications  0.9 %  sodium chloride infusion (1,000  mLs Intravenous New Bag/Given 07/27/13 2205)    Followed by  0.9 %  sodium chloride infusion (not administered)  HYDROmorphone (DILAUDID) injection 1 mg (1 mg Intravenous Given 07/27/13 2204)  ondansetron (ZOFRAN) injection 4 mg (4 mg Intravenous Given 07/27/13 2204)    MDM   1. Renal colic    Patient's pain improved after pain medications.  I suspect her pain was related to the removal of  her stent. Urinalysis does not suggest a urinary tract infection. She is oriented. Prophylactic antibiotics. Patient does have oxycodone at home for pain.   Celene Kras, MD 07/27/13 [phone]

## 2013-08-25 ENCOUNTER — Encounter: Payer: Self-pay | Admitting: Vascular Surgery

## 2013-08-26 ENCOUNTER — Encounter (INDEPENDENT_AMBULATORY_CARE_PROVIDER_SITE_OTHER): Payer: Private Health Insurance - Indemnity | Admitting: Vascular Surgery

## 2013-08-26 ENCOUNTER — Other Ambulatory Visit (HOSPITAL_COMMUNITY): Payer: Private Health Insurance - Indemnity

## 2013-09-15 ENCOUNTER — Other Ambulatory Visit: Payer: Self-pay

## 2013-09-19 ENCOUNTER — Encounter: Payer: Self-pay | Admitting: Vascular Surgery

## 2013-09-20 ENCOUNTER — Encounter: Payer: Self-pay | Admitting: Vascular Surgery

## 2013-09-20 ENCOUNTER — Ambulatory Visit (INDEPENDENT_AMBULATORY_CARE_PROVIDER_SITE_OTHER): Payer: Private Health Insurance - Indemnity | Admitting: Vascular Surgery

## 2013-09-20 VITALS — BP 122/82 | HR 88 | Ht 62.0 in | Wt 194.0 lb

## 2013-09-20 DIAGNOSIS — I728 Aneurysm of other specified arteries: Secondary | ICD-10-CM | POA: Insufficient documentation

## 2013-09-20 NOTE — Progress Notes (Signed)
Subjective:     Patient ID: Peggy Thomas, female   DOB: May 08, 1966, 47 y.o.   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 This was discovered on the CT scan in July of 2014 at which time the patient had a left ureteral calculus. Patient has never had a previous CT scan of the abdomen. She denies any current symptoms including abdominal pain, nausea, vomiting, or back pain. She has not had any further followup studies. She has no family history of aneurysmal disease other than a grandmother with a cerebral artery aneurysm.  Past Medical History  Diagnosis Date  . Hypocalcemia   . Depression 04/30/2011  . Hypothyroidism 04/30/2011  . History of kidney stones   . Right ureteral stone   . Chronic idiopathic thrombocytopenia     MILD--   HEMATOLOGIST--  DR Myna Hidalgo  . PONV (postoperative nausea and vomiting)     History  Substance Use Topics  . Smoking status: Current Every Day Smoker -- 0.75 packs/day for 30 years    Types: Cigarettes  . Smokeless tobacco: Never Used  . Alcohol Use: No     Comment: occasionally    Family History  Problem Relation Age of Onset  . Diabetes Mother     type 2  . Asthma Mother   . Cancer Mother     skin cancer  . Cancer Father     bone  . Mental illness Sister     schizophrenia  . Alcohol abuse Maternal Grandmother   . Stroke Maternal Grandmother   . Aneurysm Maternal Grandmother     brain aneurysm  . Other Maternal Grandfather     black lung  . Alzheimer's disease Paternal Grandmother   . Stroke Maternal Aunt     No Known Allergies  Current outpatient prescriptions:b complex vitamins tablet, Take 1 tablet by mouth as needed. , Disp: , Rfl: ;  levothyroxine (SYNTHROID, LEVOTHROID) 50 MCG tablet, Take 50 mcg by mouth daily before breakfast., Disp: , Rfl: ;  ibuprofen (ADVIL,MOTRIN) 600 MG tablet, Take 600 mg by mouth every 8 (eight) hours as needed for pain. , Disp: , Rfl:   oxyCODONE-acetaminophen (PERCOCET) 5-325 MG per tablet, Take 2 tablets by mouth every 4 (four) hours as needed for pain., Disp: 25 tablet, Rfl: 0;  phenazopyridine (PYRIDIUM) 200 MG tablet, Take 1 tablet (200 mg total) by mouth 3 (three) times daily as needed for pain., Disp: 30 tablet, Rfl: 0;  trimethoprim (TRIMPEX) 100 MG tablet, Take 100 mg by mouth daily. For 3 days only, Disp: , Rfl:   BP 122/82  Pulse 88  Ht 5\' 2"  (1.575 m)  Wt 194 lb (87.998 kg)  BMI 35.47 kg/m2  SpO2 92%  Body mass index is 35.47 kg/(m^2).           Review of Systems denies chest pain, dyspnea on exertion, PND, orthopnea, hemoptysis. Does have a history of chronic idiopathic thrombocytopenic purpura. Other systems are negative and a complete review of systems other than the ureteral calculi mentioned in present illness     Objective:   Physical Exam BP 122/82  Pulse 88  Ht 5\' 2"  (1.575 m)  Wt 194 lb (87.998 kg)  BMI 35.47 kg/m2  SpO2 92%  Gen.-alert and oriented x3 in no apparent distress HEENT normal for age Lungs no rhonchi or wheezing Cardiovascular regular rhythm no murmurs carotid pulses 3+ palpable no bruits audible Abdomen soft nontender no palpable masses Musculoskeletal  free of  major deformities Skin clear -no rashes Neurologic normal Lower extremities 3+ femoral and dorsalis pedis pulses palpable bilaterally with no edema  Today I reviewed the CT scan by computer. This was performed July of 2014. There is a concentrically calcified structure which appears to be a splenic artery aneurysm in the hilum of the spleen measuring about 2.6 x 1.6 cm in maximum diameter. This was a noncontrast study.     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 Arlan Organ    Plan:     Discussed the very low incidence of  rupture of this with the patient and the fact that if we treated this surgically would likely need splenectomy because of location of the aneurysm. We could attempt coil embolization and thrombosis of the aneurysm if it enlarges. Have recommended repeating CT angiogram in one year to see if there's been any change in the size. Also discussed with patient that if she does develop severe left upper quadrant or left-sided abdominal pain to report to emergency department immediately She is in agreement with proceeding. If she does require surgical treatment would need to get hematology input regarding history of idiopathic thrombocytopenic purpura and possible splenectomy

## 2013-09-21 NOTE — Addendum Note (Signed)
Addended by: Sharee Pimple on: 09/21/2013 08:33 AM   Modules accepted: Orders

## 2013-12-19 ENCOUNTER — Telehealth: Payer: Self-pay | Admitting: Family Medicine

## 2013-12-19 MED ORDER — LEVOTHYROXINE SODIUM 50 MCG PO TABS: 50.0000 ug | ORAL_TABLET | Freq: Every day | ORAL | Status: DC

## 2013-12-19 NOTE — Telephone Encounter (Signed)
Refill levothyroxine

## 2014-02-27 ENCOUNTER — Telehealth: Payer: Self-pay | Admitting: Family Medicine

## 2014-02-27 NOTE — Telephone Encounter (Signed)
Requesting refill on Levothyroxine 35mcg tab

## 2014-02-28 MED ORDER — LEVOTHYROXINE SODIUM 50 MCG PO TABS: 50.0000 ug | ORAL_TABLET | Freq: Every day | ORAL | Status: DC

## 2014-02-28 NOTE — Telephone Encounter (Signed)
Will need an annual exam with labs to assess if med is working properly

## 2014-02-28 NOTE — Telephone Encounter (Signed)
Inform pt that 1 month supply was sent into pharmacy but pt will need a new patient appt for addt refills.

## 2014-03-01 NOTE — Telephone Encounter (Signed)
Next new patient appointment is in august. Do you want me to schedule med refill and a new patient appt?

## 2014-03-01 NOTE — Telephone Encounter (Signed)
Sorry not new patient. Physical appt

## 2014-03-06 NOTE — Telephone Encounter (Signed)
Left detailed message informing patient of medication refill and that she needs to schedule cpe before further refills can be given.

## 2014-03-31 ENCOUNTER — Ambulatory Visit (HOSPITAL_BASED_OUTPATIENT_CLINIC_OR_DEPARTMENT_OTHER)
Admission: RE | Admit: 2014-03-31 | Discharge: 2014-03-31 | Disposition: A | Discharge status: Home / Self Care | Payer: Managed Care, Other (non HMO) | Source: Ambulatory Visit | Attending: Family Medicine | Admitting: Family Medicine

## 2014-03-31 ENCOUNTER — Ambulatory Visit (INDEPENDENT_AMBULATORY_CARE_PROVIDER_SITE_OTHER): Payer: Managed Care, Other (non HMO) | Admitting: Family Medicine

## 2014-03-31 ENCOUNTER — Encounter: Payer: Self-pay | Admitting: Family Medicine

## 2014-03-31 ENCOUNTER — Telehealth: Payer: Self-pay | Admitting: Family Medicine

## 2014-03-31 VITALS — BP 127/90 | HR 68 | Temp 98.6°F | Resp 18 | Ht 62.0 in | Wt 194.0 lb

## 2014-03-31 DIAGNOSIS — M79609 Pain in unspecified limb: Secondary | ICD-10-CM

## 2014-03-31 DIAGNOSIS — R609 Edema, unspecified: Secondary | ICD-10-CM

## 2014-03-31 DIAGNOSIS — N289 Disorder of kidney and ureter, unspecified: Secondary | ICD-10-CM

## 2014-03-31 DIAGNOSIS — M79674 Pain in right toe(s): Secondary | ICD-10-CM

## 2014-03-31 DIAGNOSIS — D696 Thrombocytopenia, unspecified: Secondary | ICD-10-CM

## 2014-03-31 DIAGNOSIS — IMO0001 Reserved for inherently not codable concepts without codable children: Secondary | ICD-10-CM

## 2014-03-31 DIAGNOSIS — R030 Elevated blood-pressure reading, without diagnosis of hypertension: Secondary | ICD-10-CM

## 2014-03-31 DIAGNOSIS — E039 Hypothyroidism, unspecified: Secondary | ICD-10-CM

## 2014-03-31 DIAGNOSIS — M19079 Primary osteoarthritis, unspecified ankle and foot: Secondary | ICD-10-CM | POA: Insufficient documentation

## 2014-03-31 DIAGNOSIS — E663 Overweight: Secondary | ICD-10-CM

## 2014-03-31 LAB — CBC
HEMATOCRIT: 45.5 % (ref 36.0–46.0)
HEMOGLOBIN: 16.2 g/dL — AB (ref 12.0–15.0)
MCH: 32.3 pg (ref 26.0–34.0)
MCHC: 35.6 g/dL (ref 30.0–36.0)
MCV: 90.6 fL (ref 78.0–100.0)
Platelets: 141 10*3/uL — ABNORMAL LOW (ref 150–400)
RBC: 5.02 MIL/uL (ref 3.87–5.11)
RDW: 13.7 % (ref 11.5–15.5)
WBC: 7.5 10*3/uL (ref 4.0–10.5)

## 2014-03-31 MED ORDER — LEVOTHYROXINE SODIUM 50 MCG PO TABS: 50.0000 ug | ORAL_TABLET | Freq: Every day | ORAL | Status: DC

## 2014-03-31 MED ORDER — FUROSEMIDE 20 MG PO TABS: 20.0000 mg | ORAL_TABLET | Freq: Every day | ORAL | Status: DC

## 2014-03-31 MED ORDER — IBUPROFEN 600 MG PO TABS: 600.0000 mg | ORAL_TABLET | Freq: Three times a day (TID) | ORAL | Status: DC | PRN

## 2014-03-31 NOTE — Telephone Encounter (Signed)
Relevant patient education mailed to patient.  

## 2014-03-31 NOTE — Patient Instructions (Signed)
BP< 135/90 Jobst stockings, light weight, 10-20 mmhg knee hi, on in am off in pm Elevate feet above heart   DASH Diet The DASH diet stands for "Dietary Approaches to Stop Hypertension." It is a healthy eating plan that has been shown to reduce high blood pressure (hypertension) in as little as 14 days, while also possibly providing other significant health benefits. These other health benefits include reducing the risk of breast cancer after menopause and reducing the risk of type 2 diabetes, heart disease, colon cancer, and stroke. Health benefits also include weight loss and slowing kidney failure in patients with chronic kidney disease.  DIET GUIDELINES  Limit salt (sodium). Your diet should contain less than 1500 mg of sodium daily.  Limit refined or processed carbohydrates. Your diet should include mostly whole grains. Desserts and added sugars should be used sparingly.  Include small amounts of heart-healthy fats. These types of fats include nuts, oils, and tub margarine. Limit saturated and trans fats. These fats have been shown to be harmful in the body. CHOOSING FOODS  The following food groups are based on a 2000 calorie diet. See your Registered Dietitian for individual calorie needs. Grains and Grain Products (6 to 8 servings daily)  Eat More Often: Whole-wheat bread, brown rice, whole-grain or wheat pasta, quinoa, popcorn without added fat or salt (air popped).  Eat Less Often: White bread, white pasta, white rice, cornbread. Vegetables (4 to 5 servings daily)  Eat More Often: Fresh, frozen, and canned vegetables. Vegetables may be raw, steamed, roasted, or grilled with a minimal amount of fat.  Eat Less Often/Avoid: Creamed or fried vegetables. Vegetables in a cheese sauce. Fruit (4 to 5 servings daily)  Eat More Often: All fresh, canned (in natural juice), or frozen fruits. Dried fruits without added sugar. One hundred percent fruit juice ( cup [237 mL] daily).  Eat Less  Often: Dried fruits with added sugar. Canned fruit in light or heavy syrup. [employer], Fish, and Poultry (2 servings or less daily. One serving is 3 to 4 oz [85-114 g]).  Eat More Often: Ninety percent or leaner ground beef, tenderloin, sirloin. Round cuts of beef, chicken breast, [place] breast. All fish. Grill, bake, or broil your meat. Nothing should be fried.  Eat Less Often/Avoid: Fatty cuts of meat, [place], or chicken leg, thigh, or wing. Fried cuts of meat or fish. Dairy (2 to 3 servings)  Eat More Often: Low-fat or fat-free milk, low-fat plain or light yogurt, reduced-fat or part-skim cheese.  Eat Less Often/Avoid: Milk (whole, 2%).Whole milk yogurt. Full-fat cheeses. Nuts, Seeds, and Legumes (4 to 5 servings per week)  Eat More Often: All without added salt.  Eat Less Often/Avoid: Salted nuts and seeds, canned beans with added salt. Fats and Sweets (limited)  Eat More Often: Vegetable oils, tub margarines without trans fats, sugar-free gelatin. Mayonnaise and salad dressings.  Eat Less Often/Avoid: Coconut oils, palm oils, butter, stick margarine, cream, half and half, cookies, candy, pie. FOR MORE INFORMATION The Dash Diet Eating Plan: www.dashdiet.org Document Released: 10/16/2011 Document Revised: 01/19/2012 Document Reviewed: 10/16/2011 [facility] Patient Information 2014 [place], [place].

## 2014-03-31 NOTE — Progress Notes (Signed)
Pre visit review using our clinic review tool, if applicable. No additional management support is needed unless otherwise documented below in the visit note. 

## 2014-04-01 LAB — HEPATIC FUNCTION PANEL
ALBUMIN: 4.2 g/dL (ref 3.5–5.2)
ALT: 23 U/L (ref 0–35)
AST: 17 U/L (ref 0–37)
Alkaline Phosphatase: 50 U/L (ref 39–117)
BILIRUBIN TOTAL: 0.5 mg/dL (ref 0.2–1.2)
Bilirubin, Direct: 0.1 mg/dL (ref 0.0–0.3)
Indirect Bilirubin: 0.4 mg/dL (ref 0.2–1.2)
Total Protein: 6.6 g/dL (ref 6.0–8.3)

## 2014-04-01 LAB — RENAL FUNCTION PANEL
ALBUMIN: 4.2 g/dL (ref 3.5–5.2)
BUN: 11 mg/dL (ref 6–23)
CO2: 26 meq/L (ref 19–32)
CREATININE: 0.83 mg/dL (ref 0.50–1.10)
Calcium: 9 mg/dL (ref 8.4–10.5)
Chloride: 104 mEq/L (ref 96–112)
GLUCOSE: 83 mg/dL (ref 70–99)
Phosphorus: 3.2 mg/dL (ref 2.3–4.6)
Potassium: 4.1 mEq/L (ref 3.5–5.3)
Sodium: 136 mEq/L (ref 135–145)

## 2014-04-01 LAB — LIPID PANEL
Cholesterol: 134 mg/dL (ref 0–200)
HDL: 39 mg/dL — ABNORMAL LOW (ref >39)
LDL Cholesterol: 78 mg/dL (ref 0–99)
Total CHOL/HDL Ratio: 3.4 Ratio
Triglycerides: 85 mg/dL (ref <150)
VLDL: 17 mg/dL (ref 0–40)

## 2014-04-01 LAB — TSH: TSH: 6.331 u[IU]/mL — AB (ref 0.350–4.500)

## 2014-04-01 LAB — URIC ACID: URIC ACID, SERUM: 5.2 mg/dL (ref 2.4–7.0)

## 2014-04-03 ENCOUNTER — Encounter: Payer: Self-pay | Admitting: Family Medicine

## 2014-04-03 DIAGNOSIS — M79674 Pain in right toe(s): Secondary | ICD-10-CM | POA: Insufficient documentation

## 2014-04-03 DIAGNOSIS — E039 Hypothyroidism, unspecified: Secondary | ICD-10-CM | POA: Insufficient documentation

## 2014-04-03 DIAGNOSIS — N289 Disorder of kidney and ureter, unspecified: Secondary | ICD-10-CM | POA: Insufficient documentation

## 2014-04-03 DIAGNOSIS — R030 Elevated blood-pressure reading, without diagnosis of hypertension: Secondary | ICD-10-CM | POA: Insufficient documentation

## 2014-04-03 DIAGNOSIS — R609 Edema, unspecified: Secondary | ICD-10-CM | POA: Insufficient documentation

## 2014-04-03 NOTE — Assessment & Plan Note (Signed)
Minimize sodium elevate feet, compression hose prn. May use lasix prn

## 2014-04-03 NOTE — Assessment & Plan Note (Signed)
resolved 

## 2014-04-03 NOTE — Assessment & Plan Note (Signed)
Mild, asymptomatic, stable 

## 2014-04-03 NOTE — Progress Notes (Signed)
Patient ID: AVAGRACE BOTELHO, female   DOB: 1966/11/07, 48 y.o.   MRN: [id number] ZYRA PARRILLO [id number] 08-07-1966 04/03/2014      Progress Note-Follow Up  Subjective  Chief Complaint  Chief Complaint  Patient presents with  . Follow-up  . Medication Refill    Ibuprofen, Levothyroxine  . Edema    legs swelling    HPI  Patient is a 48 year old female in today for routine medical care. Is doing well. Her ganglion cyst has resolved. She is complaining of right great toe pain with swelling at the base. He's been worsening for several months. The last 4 days and more painful usual. No redness or warmth. No trauma. Worse when she put shoes on. He is complaining of pedal edema bilaterally recently. No shortness of breath, chest pain, palpitations, GI or GU concerns noted today appear  Past Medical History  Diagnosis Date  . Hypocalcemia   . Depression 04/30/2011  . Hypothyroidism 04/30/2011  . History of kidney stones   . Right ureteral stone   . Chronic idiopathic thrombocytopenia     MILD--   HEMATOLOGIST--  DR Marin Olp  . PONV (postoperative nausea and vomiting)     Past Surgical History  Procedure Laterality Date  . Cholecystectomy  2000  . Cesarean section  1996 and 1998  . Tubal ligation  1999  . Knee arthroscopy    . Robotic assisted total hysterectomy  10-15-2010    LAPAROSCOPIC /  LYSIS ADHESIONS  . Extracorporeal shock wave lithotripsy  2003  . Cystoscopy with retrograde pyelogram, ureteroscopy and stent placement Right 07/25/2013    Procedure: CYSTOSCOPY WITH RETROGRADE PYELOGRAM, URETEROSCOPY, STONE EXTRACTION AND POSSIBLE STENT PLACEMENT;  Surgeon: Ailene Rud, MD;  Location: [facility];  Service: Urology;  Laterality: Right;    Family History  Problem Relation Age of Onset  . Diabetes Mother     type 2  . Asthma Mother   . Cancer Mother     skin cancer  . Cancer Father     bone  . Mental illness Sister     schizophrenia  .  Alcohol abuse Maternal Grandmother   . Stroke Maternal Grandmother   . Aneurysm Maternal Grandmother     brain aneurysm  . Other Maternal Grandfather     black lung  . Alzheimer's disease Paternal Grandmother   . Stroke Maternal Aunt     History   Social History  . Marital Status: Married    Spouse Name: N/A    Number of Children: N/A  . Years of Education: N/A   Occupational History  . Not on file.   Social History Main Topics  . Smoking status: Current Every Day Smoker -- 0.75 packs/day for 30 years    Types: Cigarettes  . Smokeless tobacco: Never Used  . Alcohol Use: No     Comment: occasionally  . Drug Use: No  . Sexual Activity: Not on file   Other Topics Concern  . Not on file   Social History Narrative  . No narrative on file    Current Outpatient Prescriptions on File Prior to Visit  Medication Sig Dispense Refill  . b complex vitamins tablet Take 1 tablet by mouth as needed.       Marland Kitchen oxyCODONE-acetaminophen (PERCOCET) 5-325 MG per tablet Take 2 tablets by mouth every 4 (four) hours as needed for pain.  25 tablet  0  . phenazopyridine (PYRIDIUM) 200 MG tablet Take 1 tablet (200  mg total) by mouth 3 (three) times daily as needed for pain.  30 tablet  0  . trimethoprim (TRIMPEX) 100 MG tablet Take 100 mg by mouth daily. For 3 days only       No current facility-administered medications on file prior to visit.    No Known Allergies  Review of Systems  Review of Systems  Constitutional: Negative for fever and malaise/fatigue.  HENT: Negative for congestion.   Eyes: Negative for discharge.  Respiratory: Negative for shortness of breath.   Cardiovascular: Negative for chest pain, palpitations and leg swelling.  Gastrointestinal: Negative for nausea, abdominal pain and diarrhea.  Genitourinary: Negative for dysuria.  Musculoskeletal: Negative for falls.  Skin: Negative for rash.  Neurological: Negative for loss of consciousness and headaches.   Endo/Heme/Allergies: Negative for polydipsia.  Psychiatric/Behavioral: Negative for depression and suicidal ideas. The patient is not nervous/anxious and does not have insomnia.     Objective  BP 127/90  Pulse 68  Temp(Src) 98.6 F (37 C) (Oral)  Resp 18  Ht 5\' 2"  (1.575 m)  Wt 194 lb (87.998 kg)  BMI 35.47 kg/m2  SpO2 97%  Physical Exam  Physical Exam  Constitutional: She is oriented to person, place, and time and well-developed, well-nourished, and in no distress. No distress.  HENT:  Head: Normocephalic and atraumatic.  Eyes: Conjunctivae are normal.  Neck: Neck supple. No thyromegaly present.  Cardiovascular: Normal rate, regular rhythm and normal heart sounds.   No murmur heard. Pulmonary/Chest: Effort normal and breath sounds normal. She has no wheezes.  Abdominal: She exhibits no distension and no mass.  Musculoskeletal: She exhibits no edema.  Lymphadenopathy:    She has no cervical adenopathy.  Neurological: She is alert and oriented to person, place, and time.  Skin: Skin is warm and dry. No rash noted. She is not diaphoretic.  Psychiatric: Memory, affect and judgment normal.    Lab Results  Component Value Date   TSH 6.331* 03/31/2014   Lab Results  Component Value Date   WBC 7.5 03/31/2014   HGB 16.2* 03/31/2014   HCT 45.5 03/31/2014   MCV 90.6 03/31/2014   PLT 141* 03/31/2014   Lab Results  Component Value Date   CREATININE 0.83 03/31/2014   BUN 11 03/31/2014   NA 136 03/31/2014   K 4.1 03/31/2014   CL 104 03/31/2014   CO2 26 03/31/2014   Lab Results  Component Value Date   ALT 23 03/31/2014   AST 17 03/31/2014   ALKPHOS 50 03/31/2014   BILITOT 0.5 03/31/2014   Lab Results  Component Value Date   CHOL 134 03/31/2014   Lab Results  Component Value Date   HDL 39* 03/31/2014   Lab Results  Component Value Date   LDLCALC 78 03/31/2014   Lab Results  Component Value Date   TRIG 85 03/31/2014   Lab Results  Component Value Date   CHOLHDL 3.4  03/31/2014     Assessment & Plan  Overweight Encouraged DASH diet, decrease po intake and increase exercise as tolerated. Needs 7-8 hours of sleep nightly. Avoid trans fats, eat small, frequent meals every 4-5 hours with lean proteins, complex carbs and healthy fats. Minimize simple carbs, GMO foods.  Hypothyroidism tsh mildly elevated. Increase Levothyroxine  Thrombocytopenia Mild, asymptomatic, stable  Toe pain, right Xray confirms degenerative changes. Referred to podiatry for consideration due to pain, for now topical treatments and NSAIDs  Edema Minimize sodium elevate feet, compression hose prn. May use lasix prn  Renal insufficiency Mild, will monitor  Hypocalcemia resolved

## 2014-04-03 NOTE — Assessment & Plan Note (Signed)
Xray confirms degenerative changes. Referred to podiatry for consideration due to pain, for now topical treatments and NSAIDs

## 2014-04-03 NOTE — Assessment & Plan Note (Signed)
tsh mildly elevated. Increase Levothyroxine

## 2014-04-03 NOTE — Assessment & Plan Note (Signed)
Encouraged DASH diet, decrease po intake and increase exercise as tolerated. Needs 7-8 hours of sleep nightly. Avoid trans fats, eat small, frequent meals every 4-5 hours with lean proteins, complex carbs and healthy fats. Minimize simple carbs, GMO foods. 

## 2014-04-03 NOTE — Assessment & Plan Note (Signed)
Mild, will monitor 

## 2014-04-04 MED ORDER — LEVOTHYROXINE SODIUM 75 MCG PO TABS: 75.0000 ug | ORAL_TABLET | Freq: Every day | ORAL | Status: DC

## 2014-04-04 NOTE — Addendum Note (Signed)
Addended by: Varney Daily on: 04/04/2014 10:12 AM   Modules accepted: Orders

## 2014-04-19 ENCOUNTER — Ambulatory Visit: Payer: Self-pay | Admitting: Obstetrics & Gynecology

## 2014-04-21 ENCOUNTER — Ambulatory Visit: Payer: Self-pay | Admitting: Obstetrics & Gynecology

## 2014-06-09 ENCOUNTER — Ambulatory Visit (INDEPENDENT_AMBULATORY_CARE_PROVIDER_SITE_OTHER): Payer: Managed Care, Other (non HMO)

## 2014-06-09 ENCOUNTER — Ambulatory Visit (INDEPENDENT_AMBULATORY_CARE_PROVIDER_SITE_OTHER): Payer: Managed Care, Other (non HMO) | Admitting: Podiatrist

## 2014-06-09 ENCOUNTER — Encounter: Payer: Self-pay | Admitting: Podiatrist

## 2014-06-09 VITALS — BP 113/73 | HR 67 | Resp 18

## 2014-06-09 DIAGNOSIS — M779 Enthesopathy, unspecified: Secondary | ICD-10-CM

## 2014-06-09 DIAGNOSIS — M898X9 Other specified disorders of bone, unspecified site: Secondary | ICD-10-CM

## 2014-06-09 NOTE — Patient Instructions (Signed)
Call if you are interested in surgery to remove the bone spur-- I usually book out about 3 weeks in advance.

## 2014-06-09 NOTE — Progress Notes (Signed)
   Subjective:    Patient ID: Peggy Thomas, female    DOB: November 20, 1965, 48 y.o.   MRN: [id number]  HPI MY RIGHT BIG TOE AREA AND HAS BEEN GOING ON FOR ABOUT TWO YEARS AND ACHE AND DOES SWELL SOME AND HURTS TO WEAR SHOES AND THERE IS SOME REDNESS TO IT    Review of Systems  Hematological: Bruises/bleeds easily.  All other systems reviewed and are negative.      Objective:   Physical Exam  Patient is awake, alert, and oriented x 3.  In no acute distress.  Vascular status is intact with palpable pedal pulses at 2/4 DP and PT bilateral and capillary refill time within normal limits. Neurological sensation is also intact bilaterally via Semmes Weinstein monofilament at 5/5 sites. Light touch, vibratory sensation, Achilles tendon reflex is intact. Dermatological exam reveals skin color, turger and texture as normal. No open lesions present.  Musculature intact with dorsiflexion, plantarflexion, inversion, eversion.  Pain on the dorsal area of the first metatarsal head right is noted. Notable redness and swelling in this area is seen and a palpable knot is noted.    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 Discussed conservative treatment of shoe gear changes versus surgery to remove the nodule/exostosis. She will call if she would like to pursue her surgical options.

## 2014-06-27 ENCOUNTER — Encounter: Payer: Self-pay | Admitting: Family Medicine

## 2014-06-27 ENCOUNTER — Ambulatory Visit (INDEPENDENT_AMBULATORY_CARE_PROVIDER_SITE_OTHER): Payer: Managed Care, Other (non HMO) | Admitting: Family Medicine

## 2014-06-27 VITALS — BP 125/88 | HR 86 | Temp 98.8°F | Resp 18 | Ht 62.0 in | Wt 193.0 lb

## 2014-06-27 DIAGNOSIS — J029 Acute pharyngitis, unspecified: Secondary | ICD-10-CM

## 2014-06-27 MED ORDER — AMOXICILLIN-POT CLAVULANATE 875-125 MG PO TABS: 1.0000 | ORAL_TABLET | Freq: Two times a day (BID) | ORAL | Status: DC

## 2014-06-27 NOTE — Progress Notes (Signed)
OFFICE NOTE  06/27/2014  CC:  Chief Complaint  Patient presents with  . Sore Throat    x Friday   . Fever    low grade on and off  . Headache   HPI: Patient is a 48 y.o. Caucasian female who is here for sore throat.  Onset of ST 4-5d ago, subjective f/c, not improving any. No nasal sx's, no cough.  HA the week prior but none now.  Body achy and tired.  Appetite down.  No n/v/d.  No rash. No known sick contacts. Tylenol 1000 mg prn feverish feeling.  Pertinent PMH:  Past Medical History  Diagnosis Date  . Hypocalcemia   . Depression 04/30/2011  . Hypothyroidism 04/30/2011  . History of kidney stones   . Right ureteral stone   . Chronic idiopathic thrombocytopenia     MILD--   HEMATOLOGIST--  DR Marin Olp  . PONV (postoperative nausea and vomiting)     MEDS:  Outpatient Prescriptions Prior to Visit  Medication Sig Dispense Refill  . b complex vitamins tablet Take 1 tablet by mouth as needed.       Marland Kitchen ibuprofen (ADVIL,MOTRIN) 600 MG tablet Take 1 tablet (600 mg total) by mouth every 8 (eight) hours as needed for headache, mild pain or moderate pain.  40 tablet  2  . levothyroxine (SYNTHROID, LEVOTHROID) 75 MCG tablet Take 1 tablet (75 mcg total) by mouth daily before breakfast.  30 tablet  3  . furosemide (LASIX) 20 MG tablet Take 1 tablet (20 mg total) by mouth daily.  30 tablet  3  . oxyCODONE-acetaminophen (PERCOCET) 5-325 MG per tablet Take 2 tablets by mouth every 4 (four) hours as needed for pain.  25 tablet  0  . phenazopyridine (PYRIDIUM) 200 MG tablet Take 1 tablet (200 mg total) by mouth 3 (three) times daily as needed for pain.  30 tablet  0  . trimethoprim (TRIMPEX) 100 MG tablet Take 100 mg by mouth daily. For 3 days only       No facility-administered medications prior to visit.    PE: Blood pressure 125/88, pulse 86, temperature 98.8 F (37.1 C), temperature source Temporal, resp. rate 18, height 5\' 2"  (1.575 m), weight 193 lb (87.544 kg), SpO2 96.00%. Gen:  alert, NAD.  Appears tired but nontoxic. ENT: Ears: EACs clear, normal epithelium.  TMs with good light reflex and landmarks bilaterally.  Eyes: no injection, icteris, swelling, or exudate.  EOMI, PERRLA. Nose: no drainage or turbinate edema/swelling.  No injection or focal lesion.  Mouth: lips without lesion/swelling.  Oral mucosa pink and moist.  Dentition intact and without obvious caries or gingival swelling.  Oropharynx with mild diffuse erythema, mild tonsillar exudate bilat, with slightly more fullness to right side of uvula/soft palate/right tonsil compared to left side. NECK: bilat jugulodigastric tenderness without distinct LAD or mass or asymmetry palpable. CV: RRR, no m/r/g.   LUNGS: CTA bilat, nonlabored resps, good aeration in all lung fields. Skin - no sores or suspicious lesions or rashes or color changes   LAB: rapid strep NEG  IMPRESSION AND PLAN:  Acute pharyngitis.  Rapid strep negative. Sent group A strep probe. Will start augmentin given duration of sx's and MILD asymmetry of her swelling in soft palate/throat. Tylenol 1000 mg q6h prn. Signs/symptoms to call or return for were reviewed and pt expressed understanding.  An After Visit Summary was printed and given to the patient.  FOLLOW UP: prn

## 2014-06-27 NOTE — Progress Notes (Signed)
Pre visit review using our clinic review tool, if applicable. No additional management support is needed unless otherwise documented below in the visit note. 

## 2014-06-29 LAB — CULTURE, GROUP A STREP: Organism ID, Bacteria: NORMAL

## 2014-07-27 ENCOUNTER — Encounter: Payer: Private Health Insurance - Indemnity | Admitting: Family Medicine

## 2014-08-11 ENCOUNTER — Ambulatory Visit: Payer: Self-pay | Admitting: Obstetrics & Gynecology

## 2014-08-23 ENCOUNTER — Ambulatory Visit (INDEPENDENT_AMBULATORY_CARE_PROVIDER_SITE_OTHER): Payer: Managed Care, Other (non HMO) | Admitting: Obstetrics & Gynecology

## 2014-08-23 ENCOUNTER — Encounter: Payer: Self-pay | Admitting: Obstetrics & Gynecology

## 2014-08-23 VITALS — BP 122/78 | HR 64 | Resp 16 | Ht 62.25 in | Wt 193.2 lb

## 2014-08-23 DIAGNOSIS — E038 Other specified hypothyroidism: Secondary | ICD-10-CM

## 2014-08-23 DIAGNOSIS — Z01419 Encounter for gynecological examination (general) (routine) without abnormal findings: Secondary | ICD-10-CM

## 2014-08-23 LAB — TSH: TSH: 3.84 u[IU]/mL (ref 0.350–4.500)

## 2014-08-23 NOTE — Progress Notes (Signed)
48 y.o. G2P2 MarriedCaucasianF here for annual exam.  Had issues with kidney stones last year.  Urologist is Dr. Hartley Barefoot.  Has to have cystoscopy with retrieval of stone.  Had a stent for about 2 days and then pt removed it on her own.  She had significant pain with removal and then went back to ER for pain management.    PCP:  Dr. Randel Pigg.  Being followed for renal insufficiency and thrombocytopenia.  Has seen Dr. Marin Olp who recommended PCP follow up unless lab work changes.    No vaginal bleeding.    Patient's last menstrual period was 08/10/2010.          Sexually active: Yes.    The current method of family planning is status post hysterectomy.    Exercising: No.  not regularly Smoker:  Yes 1 PPD  Health Maintenance: Pap:  08/05/10 WNL History of abnormal Pap:  no MMG:  11/10/11-normal Colonoscopy:  none BMD:   none TDaP:  09/16/11 Screening Labs: PCP, Hb today: PCP, Urine today: PCP   reports that she has been smoking Cigarettes.  She has a 30 pack-year smoking history. She has never used smokeless tobacco. She reports that she drinks alcohol. She reports that she does not use illicit drugs.  Past Medical History  Diagnosis Date  . Hypocalcemia   . Depression 04/30/2011  . Hypothyroidism 04/30/2011  . History of kidney stones   . Right ureteral stone   . Chronic idiopathic thrombocytopenia     MILD--   HEMATOLOGIST--  DR Marin Olp  . PONV (postoperative nausea and vomiting)   . 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 Cholecystectomy  2000  . Cesarean section  1996 and 1998  . Tubal ligation  1999  . Knee arthroscopy    . Robotic assisted total hysterectomy  10-15-2010    LAPAROSCOPIC /  LYSIS ADHESIONS  . Extracorporeal shock wave lithotripsy  2003  . Cystoscopy with retrograde pyelogram, ureteroscopy and stent placement Right 07/25/2013    Procedure: CYSTOSCOPY WITH RETROGRADE PYELOGRAM, URETEROSCOPY, STONE EXTRACTION AND  POSSIBLE STENT PLACEMENT;  Surgeon: Ailene Rud, MD;  Location: [facility];  Service: Urology;  Laterality: Right;    Current Outpatient Prescriptions  Medication Sig Dispense Refill  . b complex vitamins tablet Take 1 tablet by mouth as needed.       Marland Kitchen ibuprofen (ADVIL,MOTRIN) 600 MG tablet Take 1 tablet (600 mg total) by mouth every 8 (eight) hours as needed for headache, mild pain or moderate pain.  40 tablet  2  . levothyroxine (SYNTHROID, LEVOTHROID) 75 MCG tablet Take 1 tablet (75 mcg total) by mouth daily before breakfast.  30 tablet  3  . furosemide (LASIX) 20 MG tablet Take 1 tablet (20 mg total) by mouth daily.  30 tablet  3   No current facility-administered medications for this visit.    Family History  Problem Relation Age of Onset  . Diabetes Mother     type 2  . Asthma Mother   . Cancer Mother     skin cancer  . Cancer Father     bone  . Mental illness Sister     schizophrenia  . Alcohol abuse Maternal Grandmother   . Stroke Maternal Grandmother   . Aneurysm Maternal Grandmother     brain aneurysm  . Other Maternal Grandfather     black lung  . Alzheimer's disease Paternal Grandmother   .  Stroke Maternal Aunt     ROS:  Pertinent items are noted in HPI.  Otherwise, a comprehensive ROS was negative.  Exam:   BP 122/78  Pulse 64  Resp 16  Ht 5' 2.25" (1.581 m)  Wt 193 lb 3.2 oz (87.635 kg)  BMI 35.06 kg/m2  LMP 08/10/2010  Weight change:  -4#Height: 5' 2.25" (158.1 cm)  Ht Readings from Last 3 Encounters:  08/23/14 5' 2.25" (1.581 m)  06/27/14 5\' 2"  (1.575 m)  03/31/14 5\' 2"  (1.575 m)    General appearance: alert, cooperative and appears stated age Head: Normocephalic, without obvious abnormality, atraumatic Neck: no adenopathy, supple, symmetrical, trachea midline and thyroid normal to inspection and palpation Lungs: clear to auscultation bilaterally Breasts: normal appearance, no masses or tenderness Heart: regular rate and  rhythm Abdomen: soft, non-tender; bowel sounds normal; no masses,  no organomegaly Extremities: extremities normal, atraumatic, no cyanosis or edema Skin: Skin color, texture, turgor normal. No rashes or lesions Lymph nodes: Cervical, supraclavicular, and axillary nodes normal. No abnormal inguinal nodes palpated Neurologic: Grossly normal   Pelvic: External genitalia:  no lesions              Urethra:  normal appearing urethra with no masses, tenderness or lesions              Bartholins and Skenes: normal                 Vagina: normal appearing vagina with normal color and discharge, no lesions              Cervix: absent              Pap taken: No. Bimanual Exam:  Uterus:  uterus absent              Adnexa: normal adnexa and no mass, fullness, tenderness               Rectovaginal: Confirms               Anus:  normal sphincter tone, no lesions  A:  Well Woman with normal exam S/p TLH/LOA (h/o 2 prior cesarean sections) Hypothyroidism Nephrolithiasis  P:   Mammogram due!!  Pt aware.  She states she WILL schedule. pap smear not indicated PCP:  Dr. Randel Pigg.  All labs with her. TSH return annually or prn  An After Visit Summary was printed and given to the patient.

## 2014-08-28 ENCOUNTER — Encounter: Payer: Managed Care, Other (non HMO) | Admitting: Family Medicine

## 2014-09-11 ENCOUNTER — Encounter: Payer: Self-pay | Admitting: Obstetrics & Gynecology

## 2014-09-26 ENCOUNTER — Other Ambulatory Visit: Payer: Private Health Insurance - Indemnity

## 2014-09-26 ENCOUNTER — Ambulatory Visit: Payer: Private Health Insurance - Indemnity | Admitting: Vascular Surgery

## 2014-10-09 ENCOUNTER — Other Ambulatory Visit: Payer: Self-pay | Admitting: Family Medicine

## 2014-10-10 ENCOUNTER — Telehealth: Payer: Self-pay | Admitting: Vascular Surgery

## 2014-10-10 NOTE — Telephone Encounter (Signed)
-----   Message from Ovidio Hanger sent at 10/10/2014  1:48 PM EST ----- Regarding: RE: Switch MD OK to switch.  She will have to switch in 2017, anyway.  Please note that I OK'ed it.  Thanks!  ----- Message -----    From: Gena Fray    Sent: 09/19/2014   9:21 AM      To: Ovidio Hanger Subject: Switch MD                                      Delsa Sale,  This patient wants to switch MDs due to her work schedules. She is currently a [facility] pt. She wants to switch to Dr Trula Slade. She can be reached at [phone] if you have questions. If not, just give me the OK and I will get her rescheduled. She is due back in January.  Thanks! Hinton Dyer

## 2014-10-26 ENCOUNTER — Telehealth: Payer: Self-pay | Admitting: Vascular Surgery

## 2014-10-26 NOTE — Telephone Encounter (Signed)
Peggy Thomas is scheduled for a CT and to see [facility] on 11/27/2014. Her insurance, Christella Scheuermann has denied her CT stating that we have not seen her in consult within 60 days of her CT.  I have scheduled Peggy Wendel to come in for an office visit with Vinnie Level Nickel on 11/15/14 for brief OV and physical exam to meet [employer]'s requirements for pre-authorization. Pietro Cassis, our patient accounts representative is aware and will request authorization after her 11/15/14 appointment. I will forward this to [place] as FYI.  I spoke with Peggy Pickerill and she is in agreement with this plan. She knows that we will be requesting a new authorization and we will let her know if it is denied again.

## 2014-11-11 ENCOUNTER — Other Ambulatory Visit: Payer: Self-pay | Admitting: Family Medicine

## 2014-11-13 ENCOUNTER — Ambulatory Visit: Payer: Managed Care, Other (non HMO) | Admitting: Family

## 2014-11-13 NOTE — Telephone Encounter (Signed)
Levothyroxine refilled. JG//CMA

## 2014-11-14 ENCOUNTER — Other Ambulatory Visit: Payer: Managed Care, Other (non HMO)

## 2014-11-14 ENCOUNTER — Ambulatory Visit: Payer: Managed Care, Other (non HMO) | Admitting: Vascular Surgery

## 2014-11-14 ENCOUNTER — Encounter: Payer: Self-pay | Admitting: Family

## 2014-11-15 ENCOUNTER — Encounter: Payer: Self-pay | Admitting: Family

## 2014-11-15 ENCOUNTER — Ambulatory Visit (INDEPENDENT_AMBULATORY_CARE_PROVIDER_SITE_OTHER): Payer: Managed Care, Other (non HMO) | Admitting: Family

## 2014-11-15 VITALS — BP 116/82 | HR 86 | Resp 16 | Ht 62.0 in | Wt 197.0 lb

## 2014-11-15 DIAGNOSIS — I728 Aneurysm of other specified arteries: Secondary | ICD-10-CM

## 2014-11-15 NOTE — Progress Notes (Signed)
[facility]  Established Splenic artery Aneurysm  History of Present Illness  Peggy Thomas is a 49 y.o. (03/04/1966) female patient whom Dr. Kellie Simmering saw on initial referral in November 2014. She was referred for evaluation of a splenic artery aneurysm. This was discovered on the CT scan in July of 2014 at which time the patient had a left ureteral calculus. Patient had not had a previous CT scan of the abdomen to that point. She denies any current symptoms including abdominal pain, nausea, vomiting, or back pain. She has not had any further followup studies. She has no family history of aneurysmal disease other than a grandmother with a cerebral artery aneurysm. 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 At her November 2014 visit Dr. Kellie Simmering discussed with the patient the very low incidence of rupture of this and the fact that if we treated this surgically would likely need splenectomy because of location of the aneurysm. We could attempt coil embolization and thrombosis of the aneurysm if it enlarges. At that visit Dr. Kellie Simmering recommended repeating CT angiogram in one year to see if there's been any change in the size. Also discussed with patient that if she does develop severe left upper quadrant or left-sided abdominal pain to report to emergency department immediately She was in agreement with preceeding. If she does require surgical treatment would need to get hematology input regarding history of idiopathic thrombocytopenic purpura and possible splenectomy. Pt returns today for an evaluation by a medial provider at our practice before her CTA will be approved by her health insurance.  The patient does not have back or abdominal pain.  The patient is a smoker. The patient denies claudication in legs with walking. The patient denies  history of stroke or TIA symptoms. She does not take ASA, does not take a statin, is on no anticoagulants not antiplatelet agents.  Pt Diabetic: No Pt smoker: smoker  (less than 1 ppd, started at age 32 yrs)  Past Medical History  Diagnosis Date  . Hypocalcemia   . Depression 04/30/2011  . Hypothyroidism 04/30/2011  . History of kidney stones   . Right ureteral stone   . Chronic idiopathic thrombocytopenia     MILD--   HEMATOLOGIST--  DR Marin Olp  . PONV (postoperative nausea and vomiting)   . Aneurysm     on spleen-watching    Past Surgical History  Procedure Laterality Date  . Cholecystectomy  2000  . Cesarean section  1996 and 1998  . Tubal ligation  1999  . Knee arthroscopy    . Robotic assisted total hysterectomy  10-15-2010    LAPAROSCOPIC /  LYSIS ADHESIONS  . Extracorporeal shock wave lithotripsy  2003  . Cystoscopy with retrograde pyelogram, ureteroscopy and stent placement Right 07/25/2013    Procedure: CYSTOSCOPY WITH RETROGRADE PYELOGRAM, URETEROSCOPY, STONE EXTRACTION AND POSSIBLE STENT PLACEMENT;  Surgeon: Ailene Rud, MD;  Location: [facility];  Service: Urology;  Laterality: Right;  . Abdominal hysterectomy     Social History History   Social History  . Marital Status: Married    Spouse Name: N/A    Number of Children: N/A  . Years of Education: N/A   Occupational History  . Not on file.   Social History Main Topics  . Smoking status: Current Every Day Smoker -- 1.00  packs/day for 30 years    Types: Cigarettes  . Smokeless tobacco: Never Used  . Alcohol Use: Yes     Comment: occasionally/socially  . Drug Use: No  . Sexual Activity:    Partners: Male    Birth Control/ Protection: Surgical     Comment: TLH/LOA   Other Topics Concern  . Not on file   Social History Narrative   Family History Family History  Problem Relation Age of Onset  . Diabetes Mother     type 2  . Asthma Mother   . Cancer Mother     skin cancer   . Cancer Father     bone  . Mental illness Sister     schizophrenia  . Alcohol abuse Maternal Grandmother   . Stroke Maternal Grandmother   . Aneurysm Maternal Grandmother     brain aneurysm  . Other Maternal Grandfather     black lung  . Alzheimer's disease Paternal Grandmother   . Stroke Maternal Aunt     Current Outpatient Prescriptions on File Prior to Visit  Medication Sig Dispense Refill  . b complex vitamins tablet Take 1 tablet by mouth as needed.     . furosemide (LASIX) 20 MG tablet Take 1 tablet (20 mg total) by mouth daily. (Patient taking differently: Take 20 mg by mouth as needed. ) 30 tablet 3  . ibuprofen (ADVIL,MOTRIN) 600 MG tablet Take 1 tablet (600 mg total) by mouth every 8 (eight) hours as needed for headache, mild pain or moderate pain. 40 tablet 2  . levothyroxine (SYNTHROID, LEVOTHROID) 75 MCG tablet TAKE 1 TABLET (75 MCG TOTAL) BY MOUTH DAILY BEFORE BREAKFAST. 30 tablet 0   No current facility-administered medications on file prior to visit.   No Known Allergies  ROS: See HPI for pertinent positives and negatives. She denies chest pain, dyspnea on exertion, PND, orthopnea, hemoptysis. Does have a history of chronic idiopathic thrombocytopenic purpura. Other systems are negative and a complete review of systems other than the ureteral calculi mentioned in present illness  Physical Examination  Filed Vitals:   11/15/14 0916  BP: 116/82  Pulse: 86  Resp: 16  Height: 5\' 2"  (1.575 m)  Weight: 197 lb (89.359 kg)  SpO2: 97%   Body mass index is 36.02 kg/(m^2).  General: A&O x 3, WD, obese female.  Pulmonary: Sym exp, good air movt, CTAB, no rales, rhonchi, or wheezing.  Cardiac: RRR, Nl S1, S2, no detected murmur.   Carotid Bruits Right Left   Negative Negative   Aorta is not palpable Radial pulses are 2+ palpable and =                          VASCULAR EXAM:                                                                                                          LE Pulses Right Left       FEMORAL  2+ palpable  2+ palpable  POPLITEAL  not palpable   not palpable       POSTERIOR TIBIAL  not palpable   not palpable        DORSALIS PEDIS      ANTERIOR TIBIAL not palpable  not palpable      Gastrointestinal: soft, NTND, -G/R, - HSM, - masses palpated, - CVAT B.  Musculoskeletal: M/S 5/5 throughout, Extremities without ischemic changes.  Neurologic: CN 2-12 intact, Pain and light touch intact in extremities are intact, Motor exam as listed above.    Medical Decision Making  The patient is a 49 y.o. female who has a history of and incidental finding on CT of a splenic artery aneurysm. She is evaluated today in order of her insurance to cover the necessary follow up CT to evaluate whether the splenic aneurysm has changed in size.   Based on this patient's exam and diagnostic studies, and after discussing with Dr. Scot Dock re the use of IV contrast, the patient is scheduled for CTA abd/pelvis with and without contrast on 11/27/14, will follow up with Dr. Kellie Simmering as already scheduled on this date after her CT.   I emphasized the importance of maximal medical management including strict control of blood pressure, blood glucose, and lipid levels, antiplatelet agents, obtaining regular exercise, and cessation of smoking.      The patient was advised to call 911 should the patient experience sudden onset abdominal or back pain.   Thank you for allowing [place] to participate in this patient's care.  Clemon Chambers, RN, MSN, FNP-C [facility] Office: [phone]  Clinic Physician: Scot Dock  11/15/2014, 9:17 AM

## 2014-11-15 NOTE — Patient Instructions (Signed)
Smoking Cessation  Quitting smoking is important to your health and has many advantages. However, it is not always easy to quit since nicotine is a very addictive drug. Oftentimes, people try 3 times or more before being able to quit. This document explains the best ways for you to prepare to quit smoking. Quitting takes hard work and a lot of effort, but you can do it.  ADVANTAGES OF QUITTING SMOKING  · You will live longer, feel better, and live better.  · Your body will feel the impact of quitting smoking almost immediately.  · Within 20 minutes, blood pressure decreases. Your pulse returns to its normal level.  · After 8 hours, carbon monoxide levels in the blood return to normal. Your oxygen level increases.  · After 24 hours, the chance of having a heart attack starts to decrease. Your breath, hair, and body stop smelling like smoke.  · After 48 hours, damaged nerve endings begin to recover. Your sense of taste and smell improve.  · After 72 hours, the body is virtually free of nicotine. Your bronchial tubes relax and breathing becomes easier.  · After 2 to 12 weeks, lungs can hold more air. Exercise becomes easier and circulation improves.  · The risk of having a heart attack, stroke, cancer, or lung disease is greatly reduced.  · After 1 year, the risk of coronary heart disease is cut in half.  · After 5 years, the risk of stroke falls to the same as a nonsmoker.  · After 10 years, the risk of lung cancer is cut in half and the risk of other cancers decreases significantly.  · After 15 years, the risk of coronary heart disease drops, usually to the level of a nonsmoker.  · If you are pregnant, quitting smoking will improve your chances of having a healthy baby.  · The people you live with, especially any children, will be healthier.  · You will have extra money to spend on things other than cigarettes.  QUESTIONS TO THINK ABOUT BEFORE ATTEMPTING TO QUIT  You may want to talk about your answers with your  health care provider.  · Why do you want to quit?  · If you tried to quit in the past, what helped and what did not?  · What will be the most difficult situations for you after you quit? How will you plan to handle them?  · Who can help you through the tough times? Your family? Friends? A health care provider?  · What pleasures do you get from smoking? What ways can you still get pleasure if you quit?  Here are some questions to ask your health care provider:  · How can you help me to be successful at quitting?  · What medicine do you think would be best for me and how should I take it?  · What should I do if I need more help?  · What is smoking withdrawal like? How can I get information on withdrawal?  GET READY  · Set a quit date.  · Change your environment by getting rid of all cigarettes, ashtrays, matches, and lighters in your home, car, or work. Do not let people smoke in your home.  · Review your past attempts to quit. Think about what worked and what did not.  GET SUPPORT AND ENCOURAGEMENT  You have a better chance of being successful if you have help. You can get support in many ways.  · Tell your family, friends, and   coworkers that you are going to quit and need their support. Ask them not to smoke around you.  · Get individual, group, or telephone counseling and support. Programs are available at local hospitals and health centers. Call your local health department for information about programs in your area.  · Spiritual beliefs and practices may help some smokers quit.  · Download a "quit meter" on your computer to keep track of quit statistics, such as how long you have gone without smoking, cigarettes not smoked, and money saved.  · Get a self-help book about quitting smoking and staying off tobacco.  LEARN NEW SKILLS AND BEHAVIORS  · Distract yourself from urges to smoke. Talk to someone, go for a walk, or occupy your time with a task.  · Change your normal routine. Take a different route to work.  Drink tea instead of coffee. Eat breakfast in a different place.  · Reduce your stress. Take a hot bath, exercise, or read a book.  · Plan something enjoyable to do every day. Reward yourself for not smoking.  · Explore interactive web-based programs that specialize in helping you quit.  GET MEDICINE AND USE IT CORRECTLY  Medicines can help you stop smoking and decrease the urge to smoke. Combining medicine with the above behavioral methods and support can greatly increase your chances of successfully quitting smoking.  · Nicotine replacement therapy helps deliver nicotine to your body without the negative effects and risks of smoking. Nicotine replacement therapy includes nicotine gum, lozenges, inhalers, nasal sprays, and skin patches. Some may be available over-the-counter and others require a prescription.  · Antidepressant medicine helps people abstain from smoking, but how this works is unknown. This medicine is available by prescription.  · Nicotinic receptor partial agonist medicine simulates the effect of nicotine in your brain. This medicine is available by prescription.  Ask your health care provider for advice about which medicines to use and how to use them based on your health history. Your health care provider will tell you what side effects to look out for if you choose to be on a medicine or therapy. Carefully read the information on the package. Do not use any other product containing nicotine while using a nicotine replacement product.   RELAPSE OR DIFFICULT SITUATIONS  Most relapses occur within the first 3 months after quitting. Do not be discouraged if you start smoking again. Remember, most people try several times before finally quitting. You may have symptoms of withdrawal because your body is used to nicotine. You may crave cigarettes, be irritable, feel very hungry, cough often, get headaches, or have difficulty concentrating. The withdrawal symptoms are only temporary. They are strongest  when you first quit, but they will go away within 10-14 days.  To reduce the chances of relapse, try to:  · Avoid drinking alcohol. Drinking lowers your chances of successfully quitting.  · Reduce the amount of caffeine you consume. Once you quit smoking, the amount of caffeine in your body increases and can give you symptoms, such as a rapid heartbeat, sweating, and anxiety.  · Avoid smokers because they can make you want to smoke.  · Do not let weight gain distract you. Many smokers will gain weight when they quit, usually less than 10 pounds. Eat a healthy diet and stay active. You can always lose the weight gained after you quit.  · Find ways to improve your mood other than smoking.  FOR MORE INFORMATION   www.smokefree.gov   Document Released:   10/21/2001 Document Revised: 03/13/2014 Document Reviewed: 02/05/2012  ExitCare® Patient Information ©2015 [employer]. This information is not intended to replace advice given to you by your health care provider. Make sure you discuss any questions you have with your health care provider.  Smoking Cessation, Tips for Success  If you are ready to quit smoking, congratulations! You have chosen to help yourself be healthier. Cigarettes bring nicotine, tar, carbon monoxide, and other irritants into your body. Your lungs, heart, and blood vessels will be able to work better without these poisons. There are many different ways to quit smoking. Nicotine gum, nicotine patches, a nicotine inhaler, or nicotine nasal spray can help with physical craving. Hypnosis, support groups, and medicines help break the habit of smoking.  WHAT THINGS CAN I DO TO MAKE QUITTING EASIER?   Here are some tips to help you quit for good:  · Pick a date when you will quit smoking completely. Tell all of your friends and family about your plan to quit on that date.  · Do not try to slowly cut down on the number of cigarettes you are smoking. Pick a quit date and quit smoking completely starting on  that day.  · Throw away all cigarettes.    · Clean and remove all ashtrays from your home, work, and car.  · On a card, write down your reasons for quitting. Carry the card with you and read it when you get the urge to smoke.  · Cleanse your body of nicotine. Drink enough water and fluids to keep your urine clear or pale yellow. Do this after quitting to flush the nicotine from your body.  · Learn to predict your moods. Do not let a bad situation be your excuse to have a cigarette. Some situations in your life might tempt you into wanting a cigarette.  · Never have "just one" cigarette. It leads to wanting another and another. Remind yourself of your decision to quit.  · Change habits associated with smoking. If you smoked while driving or when feeling stressed, try other activities to replace smoking. Stand up when drinking your coffee. Brush your teeth after eating. Sit in a different chair when you read the paper. Avoid alcohol while trying to quit, and try to drink fewer caffeinated beverages. Alcohol and caffeine may urge you to smoke.  · Avoid foods and drinks that can trigger a desire to smoke, such as sugary or spicy foods and alcohol.  · Ask people who smoke not to smoke around you.  · Have something planned to do right after eating or having a cup of coffee. For example, plan to take a walk or exercise.  · Try a relaxation exercise to calm you down and decrease your stress. Remember, you may be tense and nervous for the first 2 weeks after you quit, but this will pass.  · Find new activities to keep your hands busy. Play with a pen, coin, or rubber band. Doodle or draw things on paper.  · Brush your teeth right after eating. This will help cut down on the craving for the taste of tobacco after meals. You can also try mouthwash.    · Use oral substitutes in place of cigarettes. Try using lemon drops, carrots, cinnamon sticks, or chewing gum. Keep them handy so they are available when you have the urge to  smoke.  · When you have the urge to smoke, try deep breathing.  · Designate your home as a nonsmoking area.  ·   If you are a heavy smoker, ask your health care provider about a prescription for nicotine chewing gum. It can ease your withdrawal from nicotine.  · Reward yourself. Set aside the cigarette money you save and buy yourself something nice.  · Look for support from others. Join a support group or smoking cessation program. Ask someone at home or at work to help you with your plan to quit smoking.  · Always ask yourself, "Do I need this cigarette or is this just a reflex?" Tell yourself, "Today, I choose not to smoke," or "I do not want to smoke." You are reminding yourself of your decision to quit.  · Do not replace cigarette smoking with electronic cigarettes (commonly called e-cigarettes). The safety of e-cigarettes is unknown, and some may contain harmful chemicals.  · If you relapse, do not give up! Plan ahead and think about what you will do the next time you get the urge to smoke.  HOW WILL I FEEL WHEN I QUIT SMOKING?  You may have symptoms of withdrawal because your body is used to nicotine (the addictive substance in cigarettes). You may crave cigarettes, be irritable, feel very hungry, cough often, get headaches, or have difficulty concentrating. The withdrawal symptoms are only temporary. They are strongest when you first quit but will go away within 10-14 days. When withdrawal symptoms occur, stay in control. Think about your reasons for quitting. Remind yourself that these are signs that your body is healing and getting used to being without cigarettes. Remember that withdrawal symptoms are easier to treat than the major diseases that smoking can cause.   Even after the withdrawal is over, expect periodic urges to smoke. However, these cravings are generally short lived and will go away whether you smoke or not. Do not smoke!  WHAT RESOURCES ARE AVAILABLE TO HELP ME QUIT SMOKING?  Your health care  provider can direct you to community resources or hospitals for support, which may include:  · Group support.  · Education.  · Hypnosis.  · Therapy.  Document Released: 07/25/2004 Document Revised: 03/13/2014 Document Reviewed: 04/14/2013  ExitCare® Patient Information ©2015 [employer]. This information is not intended to replace advice given to you by your health care provider. Make sure you discuss any questions you have with your health care provider.

## 2014-11-23 ENCOUNTER — Other Ambulatory Visit: Payer: Self-pay | Admitting: *Deleted

## 2014-11-23 DIAGNOSIS — I728 Aneurysm of other specified arteries: Secondary | ICD-10-CM

## 2014-11-24 ENCOUNTER — Other Ambulatory Visit: Payer: Self-pay | Admitting: *Deleted

## 2014-11-24 ENCOUNTER — Encounter: Payer: Self-pay | Admitting: Vascular Surgery

## 2014-11-24 DIAGNOSIS — I728 Aneurysm of other specified arteries: Secondary | ICD-10-CM

## 2014-11-27 ENCOUNTER — Ambulatory Visit (INDEPENDENT_AMBULATORY_CARE_PROVIDER_SITE_OTHER): Payer: Managed Care, Other (non HMO) | Admitting: Vascular Surgery

## 2014-11-27 ENCOUNTER — Encounter: Payer: Self-pay | Admitting: Vascular Surgery

## 2014-11-27 ENCOUNTER — Ambulatory Visit
Admission: RE | Admit: 2014-11-27 | Discharge: 2014-11-27 | Disposition: A | Discharge status: Home / Self Care | Payer: Managed Care, Other (non HMO) | Source: Ambulatory Visit | Attending: Vascular Surgery | Admitting: Vascular Surgery

## 2014-11-27 VITALS — BP 125/86 | HR 81 | Ht 62.0 in | Wt 192.0 lb

## 2014-11-27 DIAGNOSIS — I728 Aneurysm of other specified arteries: Secondary | ICD-10-CM

## 2014-11-27 MED ORDER — IOHEXOL 350 MG/ML SOLN
80.0000 mL | Freq: Once | INTRAVENOUS | Status: AC | PRN
  Administered 2014-11-27: 80 mL via INTRAVENOUS

## 2014-11-27 NOTE — Progress Notes (Signed)
Subjective:     Patient ID: Peggy Thomas, female   DOB: November 01, 1966, 49 y.o.   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 She had a left ureteral stone and the CT scan revealed a splenic artery aneurysm. Initially it was 2.4 cm in maximum diameter. I saw her in October 2014 and recommended that we follow this. She returns today with repeat CT angiogram. She has had no abdominal or back symptoms since I last saw her 15 months ago. She has been in good health. She has had no hospitalizations. She does not take blood thinners. She has had no recurrence of the ureteral stone.  Past Medical History  Diagnosis Date  . Hypocalcemia   . Depression 04/30/2011  . Hypothyroidism 04/30/2011  . History of kidney stones   . Right ureteral stone   . Chronic idiopathic thrombocytopenia     MILD--   HEMATOLOGIST--  DR Marin Olp  . PONV (postoperative nausea and vomiting)   . 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 Smoking status: Current Every Day Smoker -- 1.00 packs/day for 30 years    Types: Cigarettes  . Smokeless tobacco: Never Used  . Alcohol Use: Yes     Comment: occasionally/socially    Family History  Problem Relation Age of Onset  . Diabetes Mother     type 2  . Asthma Mother   . Cancer Mother     skin cancer  . Cancer Father     bone  . Mental illness Sister     schizophrenia  . Alcohol abuse Maternal Grandmother   . Stroke Maternal Grandmother   . Aneurysm Maternal Grandmother     brain aneurysm  . Other Maternal Grandfather     black lung  . Alzheimer's disease Paternal Grandmother   . Stroke Maternal Aunt     No Known Allergies   Current outpatient prescriptions:  .  b complex vitamins tablet, Take 1 tablet by mouth as needed. , Disp: , Rfl:  .  furosemide (LASIX) 20 MG tablet, Take 1 tablet (20 mg total) by mouth daily. (Patient taking  differently: Take 20 mg by mouth as needed. ), Disp: 30 tablet, Rfl: 3 .  ibuprofen (ADVIL,MOTRIN) 600 MG tablet, Take 1 tablet (600 mg total) by mouth every 8 (eight) hours as needed for headache, mild pain or moderate pain., Disp: 40 tablet, Rfl: 2 .  levothyroxine (SYNTHROID, LEVOTHROID) 75 MCG tablet, TAKE 1 TABLET (75 MCG TOTAL) BY MOUTH DAILY BEFORE BREAKFAST., Disp: 30 tablet, Rfl: 0 .  Naproxen Sodium (ALEVE) 220 MG CAPS, Take by mouth as needed., Disp: , Rfl:   BP 125/86 mmHg  Pulse 81  Ht 5\' 2"  (1.575 m)  Wt 192 lb (87.091 kg)  BMI 35.11 kg/m2  LMP 08/10/2010  Body mass index is 35.11 kg/(m^2).           Review of Systems has history of idiopathic thrombocytopenic purpura -followed by Dr. Marin Olp. Denies chest pain, dyspnea on exertion, PND, orthopnea, hemoptysis, claudication. All systems negative and a complete review of systems     Objective:   Physical Exam BP 125/86 mmHg  Pulse 81  Ht 5\' 2"  (1.575 m)  Wt 192 lb (87.091 kg)  BMI 35.11 kg/m2  LMP 08/10/2010  Gen.-alert and oriented x3 in no apparent distress HEENT normal for age Lungs no  rhonchi or wheezing Cardiovascular regular rhythm no murmurs carotid pulses 3+ palpable no bruits audible Abdomen soft nontender no palpable masses Musculoskeletal free of  major deformities Skin clear -no rashes Neurologic normal Lower extremities 3+ femoral and posterior tibial pulses palpable bilaterally with no edema  Today I ordered a CT angiogram of the abdomen which I reviewed by computer. Also reviewed these x-rays with Dr. Trula Slade to get his opinion regarding treatment options. The splenic artery aneurysm has increased to 27 mm in maximum diameter. It appears that there is one main artery feeding the aneurysm and 2 or 3 draining arteries. The aneurysm is in the hilum of the spleen.      Assessment:     Splenic artery aneurysm currently 2.7 cm in maximum diameter compared to 2.4 cm 15 months ago    Plan:        discussed the situation at length with the patient and also with Dr. Trula Slade regarding treatment options. We feel the best plan would be to attempt coil embolization of this aneurysm in this 49 year old healthy female. If this is unsuccessful she may require splenectomy. Potential risks including splenic infarct, infection, bleeding, or thrombus at angiographic entrance site all discussed. Patient will discuss this with her husband If she decides to proceed we will have her get vaccinations prior to the intervention which will include pneumococcal, meningococcal, and gonococcal vaccinations.  30 minutes was spent discussing this with patient and potential treatment options as well as complications

## 2014-11-29 ENCOUNTER — Ambulatory Visit (INDEPENDENT_AMBULATORY_CARE_PROVIDER_SITE_OTHER): Payer: Managed Care, Other (non HMO) | Admitting: Physician Assistant

## 2014-11-29 ENCOUNTER — Other Ambulatory Visit (HOSPITAL_COMMUNITY)
Admission: RE | Admit: 2014-11-29 | Discharge: 2014-11-29 | Disposition: A | Discharge status: Home / Self Care | Payer: Managed Care, Other (non HMO) | Source: Ambulatory Visit | Attending: Physician Assistant | Admitting: Physician Assistant

## 2014-11-29 ENCOUNTER — Encounter: Payer: Self-pay | Admitting: Physician Assistant

## 2014-11-29 ENCOUNTER — Other Ambulatory Visit: Payer: Self-pay | Admitting: *Deleted

## 2014-11-29 VITALS — BP 124/76 | HR 73 | Temp 98.5°F | Resp 16 | Ht 62.0 in | Wt 200.4 lb

## 2014-11-29 DIAGNOSIS — R399 Unspecified symptoms and signs involving the genitourinary system: Secondary | ICD-10-CM

## 2014-11-29 DIAGNOSIS — R102 Pelvic and perineal pain: Secondary | ICD-10-CM

## 2014-11-29 DIAGNOSIS — Z113 Encounter for screening for infections with a predominantly sexual mode of transmission: Secondary | ICD-10-CM | POA: Insufficient documentation

## 2014-11-29 DIAGNOSIS — N760 Acute vaginitis: Secondary | ICD-10-CM | POA: Insufficient documentation

## 2014-11-29 LAB — POCT URINALYSIS DIPSTICK
BILIRUBIN UA: NEGATIVE
GLUCOSE UA: NEGATIVE
Ketones, UA: NEGATIVE
LEUKOCYTES UA: NEGATIVE
Nitrite, UA: NEGATIVE
PROTEIN UA: NEGATIVE
RBC UA: NEGATIVE
Spec Grav, UA: >=1.03
Urobilinogen, UA: 0.2
pH, UA: 5.5

## 2014-11-29 NOTE — Progress Notes (Signed)
Pre visit review using our clinic review tool, if applicable. No additional management support is needed unless otherwise documented below in the visit note/SLS  

## 2014-11-29 NOTE — Patient Instructions (Signed)
Please increase fluid intake.  Take a daily probiotic.  I will call you with all of your results.  We will treat based on findings.  Call the health department for the immunizations your surgeon requested as we do not have some of them in stock.

## 2014-12-01 ENCOUNTER — Encounter: Payer: Self-pay | Admitting: Physician Assistant

## 2014-12-01 DIAGNOSIS — B3731 Acute candidiasis of vulva and vagina: Secondary | ICD-10-CM

## 2014-12-01 DIAGNOSIS — N760 Acute vaginitis: Principal | ICD-10-CM

## 2014-12-01 DIAGNOSIS — B373 Candidiasis of vulva and vagina: Secondary | ICD-10-CM

## 2014-12-01 DIAGNOSIS — B9689 Other specified bacterial agents as the cause of diseases classified elsewhere: Secondary | ICD-10-CM

## 2014-12-01 LAB — CULTURE, URINE COMPREHENSIVE
Colony Count: NO GROWTH
Organism ID, Bacteria: NO GROWTH

## 2014-12-01 LAB — URINE CYTOLOGY ANCILLARY ONLY
Chlamydia: NEGATIVE
NEISSERIA GONORRHEA: NEGATIVE
Trichomonas: NEGATIVE

## 2014-12-03 DIAGNOSIS — R102 Pelvic and perineal pain: Secondary | ICD-10-CM | POA: Insufficient documentation

## 2014-12-03 NOTE — Progress Notes (Signed)
Patient presents to clinic today c/o intermittent vaginal pain and discharge 2 weeks. Patient endorses some mild low back pain. Denies trauma or injury. Denies urinary symptoms including urgency, frequency or hematuria. Denies fever, chills, nausea, vomiting or flank pain. Denies history of or concern for an STI. Denies recent sexual intercourse. Denies significant history of yeast infection or bacterial vaginosis.  Past Medical History  Diagnosis Date  . Hypocalcemia   . Depression 04/30/2011  . Hypothyroidism 04/30/2011  . History of kidney stones   . Right ureteral stone   . Chronic idiopathic thrombocytopenia     MILD--   HEMATOLOGIST--  DR Marin Olp  . PONV (postoperative nausea and vomiting)   . Aneurysm     on spleen-watching     Current Outpatient Prescriptions on File Prior to Visit  Medication Sig Dispense Refill  . b complex vitamins tablet Take 1 tablet by mouth as needed.     . furosemide (LASIX) 20 MG tablet Take 1 tablet (20 mg total) by mouth daily. (Patient taking differently: Take 20 mg by mouth as needed. ) 30 tablet 3  . ibuprofen (ADVIL,MOTRIN) 600 MG tablet Take 1 tablet (600 mg total) by mouth every 8 (eight) hours as needed for headache, mild pain or moderate pain. 40 tablet 2  . levothyroxine (SYNTHROID, LEVOTHROID) 75 MCG tablet TAKE 1 TABLET (75 MCG TOTAL) BY MOUTH DAILY BEFORE BREAKFAST. 30 tablet 0  . Naproxen Sodium (ALEVE) 220 MG CAPS Take by mouth as needed.     No current facility-administered medications on file prior to visit.    No Known Allergies  Family History  Problem Relation Age of Onset  . Diabetes Mother     type 2  . Asthma Mother   . Cancer Mother     skin cancer  . Cancer Father     bone  . Mental illness Sister     schizophrenia  . Alcohol abuse Maternal Grandmother   . Stroke Maternal Grandmother   . Aneurysm Maternal Grandmother     brain aneurysm  . Other Maternal Grandfather     black lung  . Alzheimer's disease  Paternal Grandmother   . Stroke Maternal Aunt     History   Social History  . Marital Status: Married    Spouse Name: N/A    Number of Children: N/A  . Years of Education: N/A   Social History Main Topics  . Smoking status: Current Every Day Smoker -- 1.00 packs/day for 30 years    Types: Cigarettes  . Smokeless tobacco: Never Used  . Alcohol Use: Yes     Comment: occasionally/socially  . Drug Use: No  . Sexual Activity:    Partners: Male    Birth Control/ Protection: Surgical     Comment: TLH/LOA   Other Topics Concern  . None   Social History Narrative    Review of Systems - See HPI.  All other ROS are negative.  BP 124/76 mmHg  Pulse 73  Temp(Src) 98.5 F (36.9 C) (Oral)  Resp 16  Ht 5\' 2"  (1.575 m)  Wt 200 lb 6 oz (90.89 kg)  BMI 36.64 kg/m2  SpO2 97%  LMP 08/10/2010  Physical Exam  Constitutional: She is well-developed, well-nourished, and in no distress.  HENT:  Head: Normocephalic and atraumatic.  Cardiovascular: Normal rate, regular rhythm, normal heart sounds and intact distal pulses.   Pulmonary/Chest: Effort normal and breath sounds normal. No respiratory distress. She has no wheezes. She has no  rales. She exhibits no tenderness.  Abdominal: Soft. Bowel sounds are normal. She exhibits no distension and no mass. There is no tenderness. There is no rebound and no guarding.  Genitourinary:  Patient refuses pelvic examination despite having concerns of vaginal pain.  Neurological: She is alert.  Skin: Skin is warm and dry. No rash noted.  Psychiatric: Affect normal.  Vitals reviewed.   Recent Results (from the past 2160 hour(s))  Urine cytology ancillary only     Status: None   Collection Time: 11/29/14 12:00 AM  Result Value Ref Range   Chlamydia CT: Negative     Comment: Normal Reference Range - Negative   Neisseria gonorrhea NG: Negative     Comment: Normal Reference Range - Negative   Trichomonas Trichomonas: Negative     Comment: Normal  Reference Range - Negative  POCT urinalysis dipstick     Status: None   Collection Time: 11/29/14  6:09 PM  Result Value Ref Range   Color, UA yellow    Clarity, UA clear    Glucose, UA neg    Bilirubin, UA neg    Ketones, UA neg    Spec Grav, UA >=1.030    Blood, UA neg    pH, UA 5.5    Protein, UA neg    Urobilinogen, UA 0.2    Nitrite, UA neg    Leukocytes, UA Negative   CULTURE, URINE COMPREHENSIVE     Status: None   Collection Time: 11/30/14 10:30 AM  Result Value Ref Range   Colony Count NO GROWTH    Organism ID, Bacteria NO GROWTH     Assessment/Plan: Vaginal pain Denies sexual intercourse or concern for STI. Is s/p hysterectomy. Urine dip negative. Examination within normal limits. Patient refuses pelvic examination to assess cause of symptoms and to obtain wet prep.  Urine sent for culture and ancillary testing to assess for vaginal infection. Patient is aware no treatment will be given until a source of complaint is found.  Recommend if testing negative she reconsider pelvic examination by her PCP.  Supportive measures discussed -- increased hydration, avoidance of intercourse, probiotic.

## 2014-12-03 NOTE — Assessment & Plan Note (Signed)
Denies sexual intercourse or concern for STI. Is s/p hysterectomy. Urine dip negative. Examination within normal limits. Patient refuses pelvic examination to assess cause of symptoms and to obtain wet prep.  Urine sent for culture and ancillary testing to assess for vaginal infection. Patient is aware no treatment will be given until a source of complaint is found.  Recommend if testing negative she reconsider pelvic examination by her PCP.  Supportive measures discussed -- increased hydration, avoidance of intercourse, probiotic.

## 2014-12-04 ENCOUNTER — Telehealth: Payer: Self-pay | Admitting: *Deleted

## 2014-12-04 NOTE — Telephone Encounter (Signed)
Patient had some vague numbness and tingling in her LLE this morning. No injuries or falls reported. This tingling is intermittent, she states that her skin is not discolored and she has no erythema. Patient is just inquiring if this tingling has anything to do with her splenic artery aneurysm. The numbness has been improving.  She has not had her 3 vaccines yet (she will have pneumococcal and meningococcal vacs at her PCP on 12-18-14 and [facility]ococcal [facility]). I told her that this tingling would not have anything to do with her aneurysm. She is scheduled for a coiling embolectomy by Dr. Kellie Simmering on 12-26-14.

## 2014-12-05 LAB — URINE CYTOLOGY ANCILLARY ONLY
BACTERIAL VAGINITIS: POSITIVE — AB
CANDIDA VAGINITIS: POSITIVE — AB

## 2014-12-05 MED ORDER — METRONIDAZOLE 0.75 % EX GEL: CUTANEOUS | Status: DC

## 2014-12-05 MED ORDER — FLUCONAZOLE 150 MG PO TABS: 150.0000 mg | ORAL_TABLET | Freq: Once | ORAL | Status: DC

## 2014-12-05 NOTE — Telephone Encounter (Signed)
Please call the patient and let her know the lab retested her urine for vaginal infections and it tested positive for BV and Yeast.  I have sent an Rx for Metrogel to use for the BV and diflucan to take for yeast infection.  I am unsure as to why they repeated the testing unless they had made an error, but I was not informed of this or that they were going to retest specimen.  Again, medications at pharmacy.  Follow-up if no improvement.

## 2014-12-11 ENCOUNTER — Encounter: Payer: Self-pay | Admitting: *Deleted

## 2014-12-11 ENCOUNTER — Other Ambulatory Visit: Payer: Self-pay | Admitting: Family Medicine

## 2014-12-11 NOTE — Telephone Encounter (Signed)
Last TSH checked 08/24/15 and within normal limits.  Rx sent.  eal

## 2014-12-12 ENCOUNTER — Telehealth: Payer: Self-pay | Admitting: Family Medicine

## 2014-12-12 NOTE — Telephone Encounter (Signed)
Ok to administer vaccines? Ok to do at [facility]?

## 2014-12-12 NOTE — Telephone Encounter (Signed)
Caller name: Lynleigh Relation to pt: self Call back number: [phone], ok to leave a detailed message. Pharmacy:  Reason for call:   Patient states that she needs pneumonia and menginitis inj and wants to know if she can have these done at [facility]. Patient states that [facility] is doing a procedure on her 12/26/14. Mobilization of artery in her spleen anyeursym and was told that she needs these injections 1 week prior. Patient lives closer to the [facility] office and that is why she is wanting it done there.

## 2014-12-12 NOTE — Telephone Encounter (Signed)
I am fine for her to have both of these immunizations at [facility] if they are OK with it.

## 2014-12-13 NOTE — Telephone Encounter (Signed)
Spoke with [facility] office who states that patient can get her immunizations at their office.   Patient notified.

## 2014-12-18 ENCOUNTER — Telehealth: Payer: Self-pay | Admitting: Family Medicine

## 2014-12-18 ENCOUNTER — Ambulatory Visit (INDEPENDENT_AMBULATORY_CARE_PROVIDER_SITE_OTHER): Payer: Managed Care, Other (non HMO) | Admitting: Family Medicine

## 2014-12-18 DIAGNOSIS — Z23 Encounter for immunization: Secondary | ICD-10-CM

## 2014-12-18 NOTE — Telephone Encounter (Signed)
I am fine with the patient getting the shot there but not sure how Dr Anitra Lauth feels since he is the one there make sure he does not mind his CMAs giving the shots. Thanks Dr B       Previous Messages     ----- Message -----   From: Nickola Major   Sent: 12/04/2014 10:59 AM    To: Mosie Lukes, MD   Patient has to have pneumonia & menveo vaccines before her surgery. Patient is requesting to come to [facility] since our office is more convenient. Are you okay with that? Thanks, Diane. Note to self: call patient at [phone], wants to schedule early 12/18/14

## 2014-12-19 ENCOUNTER — Telehealth: Payer: Self-pay | Admitting: *Deleted

## 2014-12-19 NOTE — Telephone Encounter (Signed)
Per Alden Hipp RN at [facility], the 3rd vaccine is the HIB (Flu vac) instead of the gonoccocal vac. This change was OK'd by Dr. Kellie Simmering. The patient had the other 2 vacs yesterday at her PCP.

## 2014-12-22 ENCOUNTER — Encounter (HOSPITAL_COMMUNITY): Payer: Self-pay | Admitting: Pharmacy Technician

## 2014-12-25 MED ORDER — SODIUM CHLORIDE 0.9 % IV SOLN
INTRAVENOUS | Status: DC
  Administered 2014-12-26: 08:00:00 via INTRAVENOUS

## 2014-12-26 ENCOUNTER — Other Ambulatory Visit: Payer: Self-pay | Admitting: *Deleted

## 2014-12-26 ENCOUNTER — Ambulatory Visit (HOSPITAL_COMMUNITY)
Admission: RE | Admit: 2014-12-26 | Discharge: 2014-12-27 | Disposition: A | Discharge status: Home / Self Care | Payer: Managed Care, Other (non HMO) | Source: Ambulatory Visit | Attending: Surgery | Admitting: Surgery

## 2014-12-26 ENCOUNTER — Encounter (HOSPITAL_COMMUNITY)
Admission: RE | Disposition: A | Discharge status: Home / Self Care | Payer: Self-pay | Source: Ambulatory Visit | Attending: Surgery

## 2014-12-26 ENCOUNTER — Encounter (HOSPITAL_COMMUNITY): Payer: Self-pay | Admitting: Surgery

## 2014-12-26 DIAGNOSIS — D693 Immune thrombocytopenic purpura: Secondary | ICD-10-CM | POA: Insufficient documentation

## 2014-12-26 DIAGNOSIS — F17210 Nicotine dependence, cigarettes, uncomplicated: Secondary | ICD-10-CM | POA: Diagnosis not present

## 2014-12-26 DIAGNOSIS — I728 Aneurysm of other specified arteries: Secondary | ICD-10-CM

## 2014-12-26 DIAGNOSIS — E039 Hypothyroidism, unspecified: Secondary | ICD-10-CM | POA: Diagnosis not present

## 2014-12-26 DIAGNOSIS — R112 Nausea with vomiting, unspecified: Secondary | ICD-10-CM | POA: Insufficient documentation

## 2014-12-26 DIAGNOSIS — Z48812 Encounter for surgical aftercare following surgery on the circulatory system: Secondary | ICD-10-CM

## 2014-12-26 HISTORY — PX: PERIPHERAL VASCULAR CATHETERIZATION: SHX172C

## 2014-12-26 HISTORY — PX: EMBOLIZATION: SHX5507

## 2014-12-26 LAB — POCT I-STAT, CHEM 8
BUN: 14 mg/dL (ref 6–23)
CREATININE: 0.9 mg/dL (ref 0.50–1.10)
Calcium, Ion: 1.21 mmol/L (ref 1.12–1.23)
Chloride: 104 mmol/L (ref 96–112)
GLUCOSE: 97 mg/dL (ref 70–99)
HCT: 45 % (ref 36.0–46.0)
HEMOGLOBIN: 15.3 g/dL — AB (ref 12.0–15.0)
Potassium: 4.2 mmol/L (ref 3.5–5.1)
Sodium: 141 mmol/L (ref 135–145)
TCO2: 23 mmol/L (ref 0–100)

## 2014-12-26 LAB — POCT ACTIVATED CLOTTING TIME
ACTIVATED CLOTTING TIME: 318 s
ACTIVATED CLOTTING TIME: 263 s
ACTIVATED CLOTTING TIME: 159 s
Activated Clotting Time: 202 seconds

## 2014-12-26 SURGERY — EMBOLIZATION

## 2014-12-26 MED ORDER — FUROSEMIDE 20 MG PO TABS
20.0000 mg | ORAL_TABLET | Freq: Every day | ORAL | Status: DC
  Administered 2014-12-26 – 2014-12-27 (×2): 20 mg via ORAL
  Filled 2014-12-26 (×2): qty 1

## 2014-12-26 MED ORDER — ALUM & MAG HYDROXIDE-SIMETH 200-200-20 MG/5ML PO SUSP: 15.0000 mL | ORAL | Status: DC | PRN

## 2014-12-26 MED ORDER — FENTANYL CITRATE 0.05 MG/ML IJ SOLN
25.0000 ug | INTRAMUSCULAR | Status: DC | PRN
  Administered 2014-12-26: 25 ug via INTRAVENOUS
  Filled 2014-12-26: qty 2

## 2014-12-26 MED ORDER — ONDANSETRON HCL 4 MG/2ML IJ SOLN
4.0000 mg | INTRAMUSCULAR | Status: DC | PRN
  Administered 2014-12-26: 4 mg via INTRAVENOUS
  Filled 2014-12-26: qty 2

## 2014-12-26 MED ORDER — ONDANSETRON HCL 4 MG/2ML IJ SOLN: 4.0000 mg | Freq: Four times a day (QID) | INTRAMUSCULAR | Status: DC | PRN

## 2014-12-26 MED ORDER — SODIUM CHLORIDE 0.9 % IV BOLUS (SEPSIS)
500.0000 mL | Freq: Once | INTRAVENOUS | Status: AC
  Administered 2014-12-26: 500 mL via INTRAVENOUS

## 2014-12-26 MED ORDER — ACETAMINOPHEN 325 MG PO TABS
ORAL_TABLET | ORAL | Status: AC
  Filled 2014-12-26: qty 2

## 2014-12-26 MED ORDER — ACETAMINOPHEN 325 MG RE SUPP
325.0000 mg | RECTAL | Status: DC | PRN
Start: 2014-12-26 — End: 2014-12-27
  Filled 2014-12-26: qty 2

## 2014-12-26 MED ORDER — PHENOL 1.4 % MT LIQD
1.0000 | OROMUCOSAL | Status: DC | PRN
  Filled 2014-12-26: qty 177

## 2014-12-26 MED ORDER — HEPARIN (PORCINE) IN NACL 2-0.9 UNIT/ML-% IJ SOLN
INTRAMUSCULAR | Status: AC
  Filled 2014-12-26: qty 500

## 2014-12-26 MED ORDER — SODIUM CHLORIDE 0.9 % IV SOLN
INTRAVENOUS | Status: DC
  Administered 2014-12-26: 100 mL/h via INTRAVENOUS

## 2014-12-26 MED ORDER — MIDAZOLAM HCL 2 MG/2ML IJ SOLN
INTRAMUSCULAR | Status: AC
  Filled 2014-12-26: qty 2

## 2014-12-26 MED ORDER — OXYCODONE HCL 5 MG PO TABS
5.0000 mg | ORAL_TABLET | ORAL | Status: DC | PRN
  Administered 2014-12-26: 5 mg via ORAL
  Filled 2014-12-26: qty 2

## 2014-12-26 MED ORDER — FENTANYL CITRATE 0.05 MG/ML IJ SOLN
INTRAMUSCULAR | Status: AC
  Filled 2014-12-26: qty 2

## 2014-12-26 MED ORDER — LABETALOL HCL 5 MG/ML IV SOLN
10.0000 mg | INTRAVENOUS | Status: DC | PRN
Start: 2014-12-26 — End: 2014-12-27
  Filled 2014-12-26: qty 4

## 2014-12-26 MED ORDER — IBUPROFEN 200 MG PO TABS
600.0000 mg | ORAL_TABLET | Freq: Three times a day (TID) | ORAL | Status: DC | PRN
  Administered 2014-12-27: 600 mg via ORAL
  Filled 2014-12-26: qty 3

## 2014-12-26 MED ORDER — HYDRALAZINE HCL 20 MG/ML IJ SOLN: 5.0000 mg | INTRAMUSCULAR | Status: DC | PRN

## 2014-12-26 MED ORDER — METOPROLOL TARTRATE 1 MG/ML IV SOLN: 2.0000 mg | INTRAVENOUS | Status: DC | PRN

## 2014-12-26 MED ORDER — HEPARIN (PORCINE) IN NACL 2-0.9 UNIT/ML-% IJ SOLN
INTRAMUSCULAR | Status: AC
  Filled 2014-12-26: qty 1000

## 2014-12-26 MED ORDER — MORPHINE SULFATE 2 MG/ML IJ SOLN
2.0000 mg | INTRAMUSCULAR | Status: DC | PRN
  Administered 2014-12-26: 2 mg via INTRAVENOUS
  Filled 2014-12-26: qty 1

## 2014-12-26 MED ORDER — GUAIFENESIN-DM 100-10 MG/5ML PO SYRP: 15.0000 mL | ORAL_SOLUTION | ORAL | Status: DC | PRN

## 2014-12-26 MED ORDER — ACETAMINOPHEN 325 MG PO TABS
325.0000 mg | ORAL_TABLET | ORAL | Status: DC | PRN
  Administered 2014-12-26 – 2014-12-27 (×2): 650 mg via ORAL
  Filled 2014-12-26 (×3): qty 2

## 2014-12-26 MED ORDER — HEPARIN SODIUM (PORCINE) 1000 UNIT/ML IJ SOLN
INTRAMUSCULAR | Status: AC
  Filled 2014-12-26: qty 1

## 2014-12-26 MED ORDER — ONDANSETRON HCL 4 MG/2ML IJ SOLN
INTRAMUSCULAR | Status: AC
  Filled 2014-12-26: qty 2

## 2014-12-26 MED ORDER — LEVOTHYROXINE SODIUM 75 MCG PO TABS
75.0000 ug | ORAL_TABLET | Freq: Every day | ORAL | Status: DC
  Administered 2014-12-27: 75 ug via ORAL
  Filled 2014-12-26 (×2): qty 1

## 2014-12-26 MED ORDER — LIDOCAINE HCL (PF) 1 % IJ SOLN
INTRAMUSCULAR | Status: AC
  Filled 2014-12-26: qty 30

## 2014-12-26 MED ORDER — MORPHINE SULFATE 10 MG/ML IJ SOLN: 2.0000 mg | INTRAMUSCULAR | Status: DC | PRN

## 2014-12-26 NOTE — Interval H&P Note (Signed)
History and Physical Interval Note:  12/26/2014 9:30 AM  Peggy Thomas  has presented today for surgery, with the diagnosis of coiling embolization of splenik artery anorizium  The various methods of treatment have been discussed with the patient and family. After consideration of risks, benefits and other options for treatment, the patient has consented to  Procedure(s): EMBOLIZATION (N/A) as a surgical intervention .  The patient's history has been reviewed, patient examined, no change in status, stable for surgery.  I have reviewed the patient's chart and labs.  Questions were answered to the patient's satisfaction.     Trula Slade IV, [place]  Spoke with patient and husband.  Detailed risks and benefits.  All questions answered  WB

## 2014-12-26 NOTE — Op Note (Signed)
    Patient name: Peggy Thomas MRN: [id number] DOB: [birth year] Sex: female  12/26/2014 Pre-operative Diagnosis: Splenic artery aneurysm Post-operative diagnosis:  Same Surgeon:  Eldridge Abrahams Procedure Performed:  1.  Ultrasound-guided access, right femoral artery  2.  Abdominal aortogram  3.  Coil embolization, splenic artery  4.  Third order catheterization    Indications:  The patient has had a progressively enlarging splenic artery aneurysm which is heavily calcified.  It is in the distal splenic artery near the hilum.  She comes in today for embolization  Procedure:  The patient was identified in the holding area and taken to room 8.  The patient was then placed supine on the table and prepped and draped in the usual sterile fashion.  A time out was called.  Ultrasound was used to evaluate the right common femoral artery.  It was patent .  A digital ultrasound image was acquired.  A micropuncture needle was used to access the right common femoral artery under ultrasound guidance.  An 018 wire was advanced without resistance and a micropuncture sheath was placed.  The 018 wire was removed and a benson wire was placed.  The micropuncture sheath was exchanged for a 5 french sheath.  An omniflush catheter was advanced over the wire to the level of T12 and an abdominal aortogram was performed in the lateral projection   Findings:   Aortogram:  No significant ostial stenosis is identified within the celiac and superior mesenteric artery.  A tortuous splenic artery is identified with aneurysmal changes in the distal portion.   Intervention:  After the above images were acquired, the decision was made to proceed with intervention.  A 6 French 55 cm Ansell 1 sheath was inserted.  An IM guide catheter was then used to select the celiac artery.  A Glidewire was advanced out into the distal splenic artery followed by the catheter.  A Rosen wire was placed.  The dilator was reinserted into the  sheath and I advanced the sheath as far as I could.  The patient was then fully heparinized.  I tried multiple strategies to get into the outflow branches of the aneurysm however this proved to be very difficult.  Ultimately I was able to do so using a microcatheter through the IM catheter.  I first placed interlock 5 x 8 coil in the distal splenic artery branch.  Coming more proximally I placed a 5 x 15 coil.  These 2 coils embolize the majority of the outflow.  Next, I feel the aneurysm with multiple coils.  A 20 x 40 followed by an 18 x 40, 18 x 20, 12 x 20, 10 x 20, 8 x 20, and finally a 10 x 40 coil were used to completely PACU the aneurysm and then the inflow artery.  Completion imaging revealed minimal opacification of with contrast of the aneurysm.  There were several branches that were not embolize that maintain perfusion to the spleen.  This point I was satisfied with repair.  I switched out the long sheath for a 6 French sheath.  There were no immediate complications.  Impression:  #1  successful coil embolization of a distal splenic artery aneurysm using multiple interlock coils.  The outflow vessels and the inflow artery as well as aneurysm or embolize.    Theotis Burrow, M.D. [facility] [place] Office: [phone] Pager:  [phone]

## 2014-12-26 NOTE — Progress Notes (Signed)
S/p splenic artery aneurysm embolization  C/o pain across her abdomen and nausea. Abdomen is soft  Will increase zofran D/c morphine as pt reports nausea with meorphine.  Start fentanyl Increase IVF rate and 500cc bolus Check labs in am  Wells Brabham

## 2014-12-26 NOTE — Progress Notes (Signed)
Order for sheath removal verified per post procedural orders. Procedure explained to patient and Rt femoral artery access site assessed: level 0, palpable dorsalis pedis and posterior tibial pulses. 6 French Sheath removed and manual pressure applied for 20 minutes. Pre, peri, & post procedural vitals: HR 60, RR 12, O2 Sat upper 95, BP 105/71, Pain 2/10 discomfort at sheath site and 4/10 abdominal pain. Distal pulses remained intact after sheath removal. Access site level 0 and dressed with 4X4 gauze and tegaderm.  Gay Filler, RN confirmed condition of site. Post procedural instructions discussed with return demonstration from patient.

## 2014-12-26 NOTE — Progress Notes (Signed)
Right femoral sheath being pulled with manual pressure being held.Vitals stable.

## 2014-12-26 NOTE — H&P (View-Only) (Signed)
Subjective:     Patient ID: Peggy Thomas, female   DOB: 1966/02/25, 49 y.o.   MRN: [id number]  HPI this 49 year old healthy female is seen in follow-up regarding her splenic artery aneurysm which was discovered 18 months ago. She had a left ureteral stone and the CT scan revealed a splenic artery aneurysm. Initially it was 2.4 cm in maximum diameter. I saw her in October 2014 and recommended that we follow this. She returns today with repeat CT angiogram. She has had no abdominal or back symptoms since I last saw her 49 months ago. She has been in good health. She has had no hospitalizations. She does not take blood thinners. She has had no recurrence of the ureteral stone.  Past Medical History  Diagnosis Date  . Hypocalcemia   . Depression 04/30/2011  . Hypothyroidism 04/30/2011  . History of kidney stones   . Right ureteral stone   . Chronic idiopathic thrombocytopenia     MILD--   HEMATOLOGIST--  DR Marin Olp  . PONV (postoperative nausea and vomiting)   . 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 Smoking status: Current Every Day Smoker -- 1.00 packs/day for 30 years    Types: Cigarettes  . Smokeless tobacco: Never Used  . Alcohol Use: Yes     Comment: occasionally/socially    Family History  Problem Relation Age of Onset  . Diabetes Mother     type 2  . Asthma Mother   . Cancer Mother     skin cancer  . Cancer Father     bone  . Mental illness Sister     schizophrenia  . Alcohol abuse Maternal Grandmother   . Stroke Maternal Grandmother   . Aneurysm Maternal Grandmother     brain aneurysm  . Other Maternal Grandfather     black lung  . Alzheimer's disease Paternal Grandmother   . Stroke Maternal Aunt     No Known Allergies   Current outpatient prescriptions:  .  b complex vitamins tablet, Take 1 tablet by mouth as needed. , Disp: , Rfl:  .  furosemide (LASIX) 20 MG tablet, Take 1 tablet (20 mg total) by mouth daily. (Patient taking  differently: Take 20 mg by mouth as needed. ), Disp: 30 tablet, Rfl: 3 .  ibuprofen (ADVIL,MOTRIN) 600 MG tablet, Take 1 tablet (600 mg total) by mouth every 8 (eight) hours as needed for headache, mild pain or moderate pain., Disp: 40 tablet, Rfl: 2 .  levothyroxine (SYNTHROID, LEVOTHROID) 75 MCG tablet, TAKE 1 TABLET (75 MCG TOTAL) BY MOUTH DAILY BEFORE BREAKFAST., Disp: 30 tablet, Rfl: 0 .  Naproxen Sodium (ALEVE) 220 MG CAPS, Take by mouth as needed., Disp: , Rfl:   BP 125/86 mmHg  Pulse 81  Ht 5\' 2"  (1.575 m)  Wt 192 lb (87.091 kg)  BMI 35.11 kg/m2  LMP 08/10/2010  Body mass index is 35.11 kg/(m^2).           Review of Systems has history of idiopathic thrombocytopenic purpura -followed by Dr. Marin Olp. Denies chest pain, dyspnea on exertion, PND, orthopnea, hemoptysis, claudication. All systems negative and a complete review of systems     Objective:   Physical Exam BP 125/86 mmHg  Pulse 81  Ht 5\' 2"  (1.575 m)  Wt 192 lb (87.091 kg)  BMI 35.11 kg/m2  LMP 08/10/2010  Gen.-alert and oriented x3 in no apparent distress HEENT normal for age Lungs no  rhonchi or wheezing Cardiovascular regular rhythm no murmurs carotid pulses 3+ palpable no bruits audible Abdomen soft nontender no palpable masses Musculoskeletal free of  major deformities Skin clear -no rashes Neurologic normal Lower extremities 3+ femoral and posterior tibial pulses palpable bilaterally with no edema  Today I ordered a CT angiogram of the abdomen which I reviewed by computer. Also reviewed these x-rays with Dr. Trula Slade to get his opinion regarding treatment options. The splenic artery aneurysm has increased to 27 mm in maximum diameter. It appears that there is one main artery feeding the aneurysm and 2 or 3 draining arteries. The aneurysm is in the hilum of the spleen.      Assessment:     Splenic artery aneurysm currently 2.7 cm in maximum diameter compared to 2.4 cm 15 months ago    Plan:        discussed the situation at length with the patient and also with Dr. Trula Slade regarding treatment options. We feel the best plan would be to attempt coil embolization of this aneurysm in this 49 year old healthy female. If this is unsuccessful she may require splenectomy. Potential risks including splenic infarct, infection, bleeding, or thrombus at angiographic entrance site all discussed. Patient will discuss this with her husband If she decides to proceed we will have her get vaccinations prior to the intervention which will include pneumococcal, meningococcal, and gonococcal vaccinations.  30 minutes was spent discussing this with patient and potential treatment options as well as complications

## 2014-12-27 ENCOUNTER — Encounter (HOSPITAL_COMMUNITY): Payer: Self-pay | Admitting: General Practice

## 2014-12-27 DIAGNOSIS — I728 Aneurysm of other specified arteries: Secondary | ICD-10-CM | POA: Diagnosis not present

## 2014-12-27 LAB — BASIC METABOLIC PANEL
Anion gap: 4 — ABNORMAL LOW (ref 5–15)
BUN: 8 mg/dL (ref 6–23)
CHLORIDE: 106 mmol/L (ref 96–112)
CO2: 26 mmol/L (ref 19–32)
Calcium: 8.5 mg/dL (ref 8.4–10.5)
Creatinine, Ser: 0.83 mg/dL (ref 0.50–1.10)
GFR calc Af Amer: >90 mL/min (ref >90)
GFR calc non Af Amer: 82 mL/min — ABNORMAL LOW (ref >90)
Glucose, Bld: 127 mg/dL — ABNORMAL HIGH (ref 70–99)
Potassium: 3.6 mmol/L (ref 3.5–5.1)
Sodium: 136 mmol/L (ref 135–145)

## 2014-12-27 LAB — CBC
HCT: 36.2 % (ref 36.0–46.0)
HEMOGLOBIN: 12.5 g/dL (ref 12.0–15.0)
MCH: 31.3 pg (ref 26.0–34.0)
MCHC: 34.3 g/dL (ref 30.0–36.0)
MCV: 91.4 fL (ref 78.0–100.0)
PLATELETS: 107 10*3/uL — AB (ref 150–400)
RBC: 3.96 MIL/uL (ref 3.87–5.11)
RDW: 12.7 % (ref 11.5–15.5)
WBC: 13 10*3/uL — AB (ref 4.0–10.5)

## 2014-12-27 NOTE — Progress Notes (Addendum)
   [facility] [place]  Subjective  - Nausea over night with vomiting.  She did get up multiple times to go to the bath.  States she has left flank pain and soreness, but it has improved since yesterday.   Objective 124/73 62 99.3 F (37.4 C) (Oral) 18 98%  Intake/Output Summary (Last 24 hours) at 12/27/14 0741 Last data filed at 12/27/14 0349  Gross per 24 hour  Intake      0 ml  Output    540 ml  Net   -540 ml    Abdomin soft, positive hypo BS Palpable DP/PT pulses bil. Right groin ecchymosis, soft without hematoma Lungs non labored breathing  Assessment/Planning: POD #1  Procedure Performed: 1. Ultrasound-guided access, right femoral artery 2. Abdominal aortogram 3. Coil embolization, splenic artery 4. Third order catheterization  She want to go home today if possible She is voiding, ambulating, but unable to tolerate PO's yet and continues to have nausea.   Her pain has improved.  She is going to try Ibuprofen today and stop the narcotics all together. If she tolerates liquids we will advance her diet pending this it is possible to discharge her later today.    Laurence Slate [facility] 12/27/2014 7:41 AM --  Laboratory Lab Results:  Recent Labs  12/26/14 0735 12/27/14 0522  WBC  --  13.0*  HGB 15.3* 12.5  HCT 45.0 36.2  PLT  --  107*   BMET  Recent Labs  12/26/14 0735 12/27/14 0522  NA 141 136  K 4.2 3.6  CL 104 106  CO2  --  26  GLUCOSE 97 127*  BUN 14 8  CREATININE 0.90 0.83  CALCIUM  --  8.5    COAG No results found for: INR, PROTIME No results found for: PTT    POD#1 Pain improved in epigastric area.  Expected LUQ discomfort ABdomen soft Labs OK  Still nausea after solid food.  Tolerating Liquids.  OK to d/c if nausea clears  Annamarie Major

## 2014-12-27 NOTE — Progress Notes (Signed)
12/27/2014 6:08 PM Discharge AVS meds taken today and those due this evening reviewed.  Follow-up appointments and when to call md reviewed.  D/C IV and TELE.  Questions and concerns addressed.   D/C home per orders. [place]

## 2014-12-27 NOTE — Discharge Summary (Signed)
Discharge Summary    Peggy Thomas [birth year] 49 y.o. female  [id number]  Admission Date: 12/26/2014  Discharge Date: 12/27/14  Physician: Serafina Mitchell, MD  Admission Diagnosis: coiling embolization of splenik artery anorizium   HPI:   This is a 49 y.o. female is seen in follow-up regarding her splenic artery aneurysm which was discovered 18 months ago. She had a left ureteral stone and the CT scan revealed a splenic artery aneurysm. Initially it was 2.4 cm in maximum diameter. I saw her in October 2014 and recommended that we follow this. She returns today with repeat CT angiogram. She has had no abdominal or back symptoms since I last saw her 15 months ago. She has been in good health. She has had no hospitalizations. She does not take blood thinners. She has had no recurrence of the ureteral stone.  Hospital Course:  The patient was admitted to the hospital and taken to the operating room on 12/26/2014 and underwent: 1. Ultrasound-guided access, right femoral artery 2. Abdominal aortogram 3. Coil embolization, splenic artery 4. Third order catheterization     The pt tolerated the procedure well and was transported to the PACU in good condition.   That afternoon, she was having nausea.  Her Zofran was increased and Morphine discontinued.  Fentanyl was started and her IVF rate was increased.  By POD 1, she had some nausea overnight with vomiting.  She did have some left flank pain and soreness, but this had improved since the operative day.  She was tolerating liquids and was discharged home on POD 1.  The remainder of the hospital course consisted of increasing mobilization and increasing intake of solids without difficulty.  CBC    Component Value Date/Time   WBC 13.0* 12/27/2014 0522   WBC 6.3 06/11/2012 1012   RBC 3.96 12/27/2014 0522   RBC 4.88 06/11/2012 1012   RBC 4.54 09/18/2011 0919   HGB 12.5 12/27/2014  0522   HGB 15.6 06/11/2012 1012   HCT 36.2 12/27/2014 0522   HCT 44.0 06/11/2012 1012   PLT 107* 12/27/2014 0522   PLT 108* 06/11/2012 1012   MCV 91.4 12/27/2014 0522   MCV 90 06/11/2012 1012   MCH 31.3 12/27/2014 0522   MCH 32.0 06/11/2012 1012   MCHC 34.3 12/27/2014 0522   MCHC 35.5 06/11/2012 1012   RDW 12.7 12/27/2014 0522   RDW 12.5 06/11/2012 1012   LYMPHSABS 3.6 07/27/2013 2200   LYMPHSABS 2.3 06/11/2012 1012   MONOABS 0.5 07/27/2013 2200   EOSABS 0.1 07/27/2013 2200   EOSABS 0.1 06/11/2012 1012   BASOSABS 0.0 07/27/2013 2200   BASOSABS 0.0 06/11/2012 1012    BMET    Component Value Date/Time   NA 136 12/27/2014 0522   K 3.6 12/27/2014 0522   CL 106 12/27/2014 0522   CO2 26 12/27/2014 0522   GLUCOSE 127* 12/27/2014 0522   BUN 8 12/27/2014 0522   CREATININE 0.83 12/27/2014 0522   CREATININE 0.83 03/31/2014 1044   CALCIUM 8.5 12/27/2014 0522   GFRNONAA 82* 12/27/2014 0522   GFRAA >90 12/27/2014 0522     Discharge Instructions:   The patient is discharged with extensive instructions on wound care and progressive ambulation.  They are instructed not to drive or perform any heavy lifting until returning to see the physician in his office.  Discharge Instructions    Call MD for:  redness, tenderness, or signs of infection (pain, swelling, bleeding, redness, odor or green/yellow discharge around incision site)  Complete by:  As directed      Call MD for:  severe or increased pain, loss or decreased feeling  in affected limb(s)    Complete by:  As directed      Call MD for:  temperature >100.5    Complete by:  As directed      Driving Restrictions    Complete by:  As directed   No driving for 24 hours and while taking pain medication.     Lifting restrictions    Complete by:  As directed   No lifting for 4 weeks     Resume previous diet    Complete by:  As directed      may wash over wound with mild soap and water    Complete by:  As directed             Discharge Diagnosis:  coiling embolization of splenik artery anorizium  Secondary Diagnosis: Patient Active Problem List   Diagnosis Date Noted  . Vaginal pain 12/03/2014  . Toe pain, right 04/03/2014  . Edema 04/03/2014  . Renal insufficiency 04/03/2014  . Elevated BP 04/03/2014  . Unspecified hypothyroidism 04/03/2014  . Splenic artery aneurysm 09/20/2013  . Acute bronchitis 07/21/2013  . Thrombocytopenia 05/29/2011  . Depression 04/30/2011  . Hypothyroidism 04/30/2011  . Tobacco abuse 04/30/2011  . Uterus, adenomyosis 04/30/2011  . Tendonitis   . Fatigue   . Hypocalcemia   . Overweight    Past Medical History  Diagnosis Date  . Hypocalcemia   . Depression 04/30/2011  . Hypothyroidism 04/30/2011  . History of kidney stones   . Right ureteral stone   . Chronic idiopathic thrombocytopenia     MILD--   HEMATOLOGIST--  DR Marin Olp  . PONV (postoperative nausea and vomiting)   . Aneurysm     on spleen-watching        Medication List    STOP taking these medications        fluconazole 150 MG tablet  Commonly known as:  DIFLUCAN     metroNIDAZOLE 0.75 % gel  Commonly known as:  METROGEL      TAKE these medications        ALEVE 220 MG Caps  Generic drug:  Naproxen Sodium  Take by mouth as needed.     b complex vitamins tablet  Take 1 tablet by mouth as needed.     furosemide 20 MG tablet  Commonly known as:  LASIX  Take 1 tablet (20 mg total) by mouth daily.     ibuprofen 600 MG tablet  Commonly known as:  ADVIL,MOTRIN  Take 1 tablet (600 mg total) by mouth every 8 (eight) hours as needed for headache, mild pain or moderate pain.     levothyroxine 75 MCG tablet  Commonly known as:  SYNTHROID, LEVOTHROID  TAKE 1 TABLET (75 MCG TOTAL) BY MOUTH DAILY BEFORE BREAKFAST.        No pain Rx given  Disposition: home  Patient's condition: is Good  Follow up: 1. Dr. Trula Slade in 4 weeks   Leontine Locket, PA-C [facility] [phone] 12/27/2014  5:13 PM

## 2014-12-29 ENCOUNTER — Encounter (HOSPITAL_COMMUNITY): Payer: Self-pay | Admitting: *Deleted

## 2014-12-29 ENCOUNTER — Telehealth: Payer: Self-pay

## 2014-12-29 ENCOUNTER — Emergency Department (HOSPITAL_COMMUNITY): Payer: Managed Care, Other (non HMO)

## 2014-12-29 ENCOUNTER — Emergency Department (HOSPITAL_COMMUNITY)
Admission: EM | Admit: 2014-12-29 | Discharge: 2014-12-29 | Disposition: A | Discharge status: Home / Self Care | Payer: Managed Care, Other (non HMO) | Attending: Emergency Medicine | Admitting: Emergency Medicine

## 2014-12-29 DIAGNOSIS — Z87442 Personal history of urinary calculi: Secondary | ICD-10-CM | POA: Diagnosis not present

## 2014-12-29 DIAGNOSIS — Z9889 Other specified postprocedural states: Secondary | ICD-10-CM | POA: Diagnosis not present

## 2014-12-29 DIAGNOSIS — G8918 Other acute postprocedural pain: Secondary | ICD-10-CM

## 2014-12-29 DIAGNOSIS — Z8679 Personal history of other diseases of the circulatory system: Secondary | ICD-10-CM | POA: Diagnosis not present

## 2014-12-29 DIAGNOSIS — R0789 Other chest pain: Secondary | ICD-10-CM | POA: Insufficient documentation

## 2014-12-29 DIAGNOSIS — R531 Weakness: Secondary | ICD-10-CM | POA: Diagnosis present

## 2014-12-29 DIAGNOSIS — E039 Hypothyroidism, unspecified: Secondary | ICD-10-CM | POA: Diagnosis not present

## 2014-12-29 DIAGNOSIS — Z8659 Personal history of other mental and behavioral disorders: Secondary | ICD-10-CM | POA: Insufficient documentation

## 2014-12-29 DIAGNOSIS — R1012 Left upper quadrant pain: Secondary | ICD-10-CM | POA: Diagnosis not present

## 2014-12-29 DIAGNOSIS — Z720 Tobacco use: Secondary | ICD-10-CM | POA: Diagnosis not present

## 2014-12-29 DIAGNOSIS — Z862 Personal history of diseases of the blood and blood-forming organs and certain disorders involving the immune mechanism: Secondary | ICD-10-CM | POA: Insufficient documentation

## 2014-12-29 DIAGNOSIS — Z79899 Other long term (current) drug therapy: Secondary | ICD-10-CM | POA: Diagnosis not present

## 2014-12-29 LAB — CBC WITH DIFFERENTIAL/PLATELET
Basophils Absolute: 0 10*3/uL (ref 0.0–0.1)
Basophils Relative: 0 % (ref 0–1)
EOS ABS: 0 10*3/uL (ref 0.0–0.7)
Eosinophils Relative: 0 % (ref 0–5)
HCT: 41 % (ref 36.0–46.0)
HEMOGLOBIN: 14.4 g/dL (ref 12.0–15.0)
Lymphocytes Relative: 14 % (ref 12–46)
Lymphs Abs: 2 10*3/uL (ref 0.7–4.0)
MCH: 31.3 pg (ref 26.0–34.0)
MCHC: 35.1 g/dL (ref 30.0–36.0)
MCV: 89.1 fL (ref 78.0–100.0)
Monocytes Absolute: 1.1 10*3/uL — ABNORMAL HIGH (ref 0.1–1.0)
Monocytes Relative: 7 % (ref 3–12)
NEUTROS ABS: 11.2 10*3/uL — AB (ref 1.7–7.7)
NEUTROS PCT: 79 % — AB (ref 43–77)
Platelets: 141 10*3/uL — ABNORMAL LOW (ref 150–400)
RBC: 4.6 MIL/uL (ref 3.87–5.11)
RDW: 12.8 % (ref 11.5–15.5)
WBC: 14.3 10*3/uL — ABNORMAL HIGH (ref 4.0–10.5)

## 2014-12-29 LAB — COMPREHENSIVE METABOLIC PANEL
ALBUMIN: 3.7 g/dL (ref 3.5–5.2)
ALK PHOS: 57 U/L (ref 39–117)
ALT: 48 U/L — ABNORMAL HIGH (ref 0–35)
AST: 27 U/L (ref 0–37)
Anion gap: 11 (ref 5–15)
BUN: 6 mg/dL (ref 6–23)
CHLORIDE: 100 mmol/L (ref 96–112)
CO2: 24 mmol/L (ref 19–32)
Calcium: 9.1 mg/dL (ref 8.4–10.5)
Creatinine, Ser: 0.91 mg/dL (ref 0.50–1.10)
GFR calc Af Amer: 85 mL/min — ABNORMAL LOW (ref >90)
GFR calc non Af Amer: 73 mL/min — ABNORMAL LOW (ref >90)
Glucose, Bld: 126 mg/dL — ABNORMAL HIGH (ref 70–99)
POTASSIUM: 4 mmol/L (ref 3.5–5.1)
Sodium: 135 mmol/L (ref 135–145)
Total Bilirubin: 1 mg/dL (ref 0.3–1.2)
Total Protein: 7.9 g/dL (ref 6.0–8.3)

## 2014-12-29 LAB — I-STAT TROPONIN, ED: TROPONIN I, POC: 0 ng/mL (ref 0.00–0.08)

## 2014-12-29 LAB — LIPASE, BLOOD: Lipase: 21 U/L (ref 11–59)

## 2014-12-29 MED ORDER — ONDANSETRON HCL 4 MG PO TABS: 4.0000 mg | ORAL_TABLET | Freq: Four times a day (QID) | ORAL | Status: DC

## 2014-12-29 MED ORDER — HYDROCODONE-ACETAMINOPHEN 5-325 MG PO TABS: 1.0000 | ORAL_TABLET | Freq: Four times a day (QID) | ORAL | Status: DC | PRN

## 2014-12-29 MED ORDER — SODIUM CHLORIDE 0.9 % IV BOLUS (SEPSIS)
1000.0000 mL | Freq: Once | INTRAVENOUS | Status: AC
  Administered 2014-12-29: 1000 mL via INTRAVENOUS

## 2014-12-29 MED ORDER — HYDROCODONE-ACETAMINOPHEN 5-325 MG PO TABS
1.0000 | ORAL_TABLET | Freq: Once | ORAL | Status: AC
  Administered 2014-12-29: 1 via ORAL
  Filled 2014-12-29: qty 1

## 2014-12-29 NOTE — ED Provider Notes (Signed)
Medical screening examination/treatment/procedure(s) were conducted as a shared visit with non-physician practitioner(s) and myself.  I personally evaluated the patient during the encounter.   EKG Interpretation   Date/Time:  Friday December 29 2014 16:37:42 EST Ventricular Rate:  109 PR Interval:  148 QRS Duration: 86 QT Interval:  312 QTC Calculation: 420 R Axis:   166 Text Interpretation:  Sinus tachycardia Right axis deviation Cannot rule  out Anterior infarct , age undetermined T wave abnormality, consider  lateral ischemia Abnormal ECG No significant change since last tracing  Confirmed by Debby Freiberg [phone]) on 12/29/2014 6:05:43 PM       Briefly, pt is a 49 y.o. female presenting with LUQ intermittent pain in setting of recent splenic artery embolization. The patient denies chest pain, dyspnea.  I performed an examination on the patient including cardiac, pulmonary, and gi systems which were remarkable for left upper quadrant tenderness. The patient was initially tachycardic, however this responded very quickly with fluids.   No dyspnea.  Doubt PE.  Discussed case with vascular who feel pt is stable to fu as outpt, this was likely routine pain after embolization.  DC home in stable condition.  Debby Freiberg, MD 12/29/14 [phone]

## 2014-12-29 NOTE — Discharge Instructions (Signed)
Follow-up with Dr. Trula Slade and your primary care doctor. Return to the ER with any worsening of your pain, difficulty controlling her pain, nausea, vomiting, high fever, severe chest pain, shortness of breath.   Pain Relief Preoperatively and Postoperatively Being a good patient does not mean being a silent one.If you have questions, problems, or concerns about the pain you may feel after surgery, let your caregiver know.Patients have the right to assessment and management of pain. The treatment of pain after surgery is important to speed up recovery and return to normal activities. Severe pain after surgery, and the fear or anxiety associated with that pain, may cause extreme discomfort that:  Prevents sleep.  Decreases the ability to breathe deeply and cough. This can cause pneumonia or other upper airway infections.  Causes your heart to beat faster and your blood pressure to be higher.  Increases the risk for constipation and bloating.  Decreases the ability of wounds to heal.  May result in depression, increased anxiety, and feelings of helplessness. Relief of pain before surgery is also important because it will lessen the pain after surgery. Patients who receive both pain relief before and after surgery experience greater pain relief than those who only receive pain relief after surgery. Let your caregiver know if you are having uncontrolled pain.This is very important.Pain after surgery is more difficult to manage if it is permitted to become severe, so prompt and adequate treatment of acute pain is necessary. PAIN CONTROL METHODS Your caregivers follow policies and procedures about the management of patient pain.These guidelines should be explained to you before surgery.Plans for pain control after surgery must be mutually decided upon and instituted with your full understanding and agreement.Do not be afraid to ask questions regarding the care you are receiving.There are many  different ways your caregivers will attempt to control your pain, including the following methods. As needed pain control  You may be given pain medicine either through your intravenous (IV) tube, or as a pill or liquid you can swallow. You will need to let your caregiver know when you are having pain. Then, your caregiver will give you the pain medicine ordered for you.  Your pain medicine may make you constipated. If constipation occurs, drink more liquids if you can. Your caregiver may have you take a mild laxative. IV patient-controlled analgesia pump (PCA pump)  You can get your pain medicine through the IV tube which goes into your vein. You are able to control the amount of pain medicine that you get. The pain medicine flows in through an IV tube and is controlled by a pump. This pump gives you a set amount of pain medicine when you push the button hooked up to it. Nobody should push this button but you or someone specifically assigned by you to do so. It is set up to keep you from accidentally giving yourself too much pain medicine. You will be able to start using your pain pump in the recovery room after your surgery. This method can be helpful for most types of surgery.  If you are still having too much pain, tell your caregiver. Also, tell your caregiver if you are feeling too sleepy or nauseous. Continuous epidural pain control  A thin, soft tube (catheter) is put into your back. Pain medicine flows through the catheter to lessen pain in the part of your body where the surgery is done. Continuous epidural pain control may work best for you if you are having surgery on your  chest, abdomen, hip area, or legs. The epidural catheter is usually put into your back just before surgery. The catheter is left in until you can eat and take medicine by mouth. In most cases, this may take 2 to 3 days.  Giving pain medicine through the epidural catheter may help you heal faster because:  Your bowel  gets back to normal faster.  You can get back to eating sooner.  You can be up and walking sooner. Medicine that numbs the area (local anesthetic)  You may receive an injection of pain medicine near where the pain is (local infiltration).  You may receive an injection of pain medicine near the nerve that controls the sensation to a specific part of the body (peripheral nerve block).  Medicine may be put in the spine to block pain (spinal block). Opioids  Moderate to moderately severe acute pain after surgery may respond to opioids.Opioids are narcotic pain medicine. Opioids are often combined with non-narcotic medicines to improve pain relief, diminish the risk of side effects, and reduce the chance of addiction.  If you follow your caregiver's directions about taking opioids and you do not have a history of substance abuse, your risk of becoming addicted is exceptionally small.Opioids are given for short periods of time in careful doses to prevent addiction. Other methods of pain control include:  Steroids.  Physical therapy.  Heat and cold therapy.  Compression, such as wrapping an elastic bandage around the area of pain.  Massage. These various ways of controlling pain may be used together. Combining different methods of pain control is called multimodal analgesia. Using this approach has many benefits, including being able to eat, move around, and leave the hospital sooner. Document Released: 01/17/2003 Document Revised: 01/19/2012 Document Reviewed: 01/21/2011 [facility] Patient Information 2015 [place], [place]. This information is not intended to replace advice given to you by your health care provider. Make sure you discuss any questions you have with your health care provider.   Chest Pain (Nonspecific) It is often hard to give a specific diagnosis for the cause of chest pain. There is always a chance that your pain could be related to something serious, such as a heart  attack or a blood clot in the lungs. You need to follow up with your health care provider for further evaluation. CAUSES   Heartburn.  Pneumonia or bronchitis.  Anxiety or stress.  Inflammation around your heart (pericarditis) or lung (pleuritis or pleurisy).  A blood clot in the lung.  A collapsed lung (pneumothorax). It can develop suddenly on its own (spontaneous pneumothorax) or from trauma to the chest.  Shingles infection (herpes zoster virus). The chest wall is composed of bones, muscles, and cartilage. Any of these can be the source of the pain.  The bones can be bruised by injury.  The muscles or cartilage can be strained by coughing or overwork.  The cartilage can be affected by inflammation and become sore (costochondritis). DIAGNOSIS  Lab tests or other studies may be needed to find the cause of your pain. Your health care provider may have you take a test called an ambulatory electrocardiogram (ECG). An ECG records your heartbeat patterns over a 24-hour period. You may also have other tests, such as:  Transthoracic echocardiogram (TTE). During echocardiography, sound waves are used to evaluate how blood flows through your heart.  Transesophageal echocardiogram (TEE).  Cardiac monitoring. This allows your health care provider to monitor your heart rate and rhythm in real time.  Holter monitor.  This is a portable device that records your heartbeat and can help diagnose heart arrhythmias. It allows your health care provider to track your heart activity for several days, if needed.  Stress tests by exercise or by giving medicine that makes the heart beat faster. TREATMENT   Treatment depends on what may be causing your chest pain. Treatment may include:  Acid blockers for heartburn.  Anti-inflammatory medicine.  Pain medicine for inflammatory conditions.  Antibiotics if an infection is present.  You may be advised to change lifestyle habits. This includes  stopping smoking and avoiding alcohol, caffeine, and chocolate.  You may be advised to keep your head raised (elevated) when sleeping. This reduces the chance of acid going backward from your stomach into your esophagus. Most of the time, nonspecific chest pain will improve within 2-3 days with rest and mild pain medicine.  HOME CARE INSTRUCTIONS   If antibiotics were prescribed, take them as directed. Finish them even if you start to feel better.  For the next few days, avoid physical activities that bring on chest pain. Continue physical activities as directed.  Do not use any tobacco products, including cigarettes, chewing tobacco, or electronic cigarettes.  Avoid drinking alcohol.  Only take medicine as directed by your health care provider.  Follow your health care provider's suggestions for further testing if your chest pain does not go away.  Keep any follow-up appointments you made. If you do not go to an appointment, you could develop lasting (chronic) problems with pain. If there is any problem keeping an appointment, call to reschedule. SEEK MEDICAL CARE IF:   Your chest pain does not go away, even after treatment.  You have a rash with blisters on your chest.  You have a fever. SEEK IMMEDIATE MEDICAL CARE IF:   You have increased chest pain or pain that spreads to your arm, neck, jaw, back, or abdomen.  You have shortness of breath.  You have an increasing cough, or you cough up blood.  You have severe back or abdominal pain.  You feel nauseous or vomit.  You have severe weakness.  You faint.  You have chills. This is an emergency. Do not wait to see if the pain will go away. Get medical help at once. Call your local emergency services (911 in [place] Do not drive yourself to the hospital. MAKE SURE YOU:   Understand these instructions.  Will watch your condition.  Will get help right away if you are not doing well or get worse. Document Released:  08/06/2005 Document Revised: 11/01/2013 Document Reviewed: 06/01/2008 [facility] Patient Information 2015 [place], [place]. This information is not intended to replace advice given to you by your health care provider. Make sure you discuss any questions you have with your health care provider.

## 2014-12-29 NOTE — ED Provider Notes (Signed)
CSN: [id number]     Arrival date & time 12/29/14  1557 History   First MD Initiated Contact with Patient 12/29/14 1656     Chief Complaint  Patient presents with  . Post-op Problem  . Weakness     (Consider location/radiation/quality/duration/timing/severity/associated sxs/prior Treatment) HPI Peggy Thomas is a 49 year old female who is postop day 3 status post coil embolization of splenic artery aneurysm who presents the ER complaining of weakness and heaviness the right side of her body which began this morning. Patient states weakness and heaviness has been intermittent throughout the day, she states initially was the right side of her body then moved to the left. She states his afternoon she then began to note some intermittent chest heaviness which radiated into her left arm. Patient reports having some abdominal soreness after the surgery on Tuesday which has been persistent, however is not any worse. Patient reports some associated shortness of breath when she lays on her left side and experiences sharp pain. Patient states the shortness of breath subsides after she gets up and the pain ceases. Patient denies associated dizziness, weakness, blurred vision, headache, slurred speech, palpitations, dyspnea, nausea, vomiting, fever, dysuria. Patient states her last bowel movement was prior to the surgery on Monday.   Past Medical History  Diagnosis Date  . Hypocalcemia   . Depression 04/30/2011  . Hypothyroidism 04/30/2011  . History of kidney stones   . Right ureteral stone   . Chronic idiopathic thrombocytopenia     MILD--   HEMATOLOGIST--  DR Marin Olp  . PONV (postoperative nausea and vomiting)   . Aneurysm     on spleen-watching    Past Surgical History  Procedure Laterality Date  . Cholecystectomy  2000  . Cesarean section  1996 and 1998  . Tubal ligation  1999  . Knee arthroscopy    . Robotic assisted total hysterectomy  10-15-2010    LAPAROSCOPIC /  LYSIS ADHESIONS  .  Extracorporeal shock wave lithotripsy  2003  . Cystoscopy with retrograde pyelogram, ureteroscopy and stent placement Right 07/25/2013    Procedure: CYSTOSCOPY WITH RETROGRADE PYELOGRAM, URETEROSCOPY, STONE EXTRACTION AND POSSIBLE STENT PLACEMENT;  Surgeon: Ailene Rud, MD;  Location: [facility];  Service: Urology;  Laterality: Right;  . Abdominal hysterectomy    . Embolization N/A 12/26/2014    Procedure: EMBOLIZATION;  Surgeon: Serafina Mitchell, MD;  Location: [facility] CATH LAB;  Service: Cardiovascular;  Laterality: N/A;  . Peripheral vascular catheterization  12/26/2014    Procedure: VISCERAL ANGIOGRAPHY;  Surgeon: Serafina Mitchell, MD;  Location: [facility] CATH LAB;  Service: Cardiovascular;;   Family History  Problem Relation Age of Onset  . Diabetes Mother     type 2  . Asthma Mother   . Cancer Mother     skin cancer  . Cancer Father     bone  . Mental illness Sister     schizophrenia  . Alcohol abuse Maternal Grandmother   . Stroke Maternal Grandmother   . Aneurysm Maternal Grandmother     brain aneurysm  . Other Maternal Grandfather     black lung  . Alzheimer's disease Paternal Grandmother   . Stroke Maternal Aunt    History  Substance Use Topics  . Smoking status: Current Every Day Smoker -- 1.00 packs/day for 30 years    Types: Cigarettes  . Smokeless tobacco: Never Used  . Alcohol Use: Yes     Comment: occasionally/socially   OB History    Gravida Para  Term Preterm AB TAB SAB Ectopic Multiple Living   2 2        2      Review of Systems  Constitutional: Negative for fever.  HENT: Negative for trouble swallowing.   Eyes: Negative for visual disturbance.  Respiratory: Negative for shortness of breath.   Cardiovascular: Positive for chest pain.  Gastrointestinal: Negative for nausea, vomiting and abdominal pain.  Genitourinary: Negative for dysuria.  Musculoskeletal: Negative for neck pain.  Skin: Negative for rash.  Neurological: Negative for  dizziness, syncope, facial asymmetry, speech difficulty, weakness, numbness and headaches.  Psychiatric/Behavioral: Negative.       Allergies  Review of patient's allergies indicates no known allergies.  Home Medications   Prior to Admission medications   Medication Sig Start Date End Date Taking? Authorizing Provider  acetaminophen (TYLENOL) 500 MG tablet Take 1,000 mg by mouth every 6 (six) hours as needed for moderate pain.   Yes Historical Provider, MD  b complex vitamins tablet Take 1 tablet by mouth as needed (for energy).    Yes Historical Provider, MD  furosemide (LASIX) 20 MG tablet Take 1 tablet (20 mg total) by mouth daily. 03/31/14  Yes Mosie Lukes, MD  levothyroxine (SYNTHROID, LEVOTHROID) 75 MCG tablet TAKE 1 TABLET (75 MCG TOTAL) BY MOUTH DAILY BEFORE BREAKFAST. 12/11/14  Yes Mosie Lukes, MD  senna (SENOKOT) 8.6 MG tablet Take 2 tablets by mouth as needed for constipation.   Yes Historical Provider, MD  HYDROcodone-acetaminophen (NORCO/VICODIN) 5-325 MG per tablet Take 1-2 tablets by mouth every 6 (six) hours as needed for moderate pain or severe pain. 12/29/14   Carrie Mew, PA-C  ibuprofen (ADVIL,MOTRIN) 600 MG tablet Take 1 tablet (600 mg total) by mouth every 8 (eight) hours as needed for headache, mild pain or moderate pain. 03/31/14   Mosie Lukes, MD  ondansetron (ZOFRAN) 4 MG tablet Take 1 tablet (4 mg total) by mouth every 6 (six) hours. 12/29/14   Carrie Mew, PA-C   BP 118/72 mmHg  Pulse 79  Temp(Src) 99.4 F (37.4 C) (Oral)  Resp 18  SpO2 95%  LMP 08/10/2010 Physical Exam  Constitutional: She is oriented to person, place, and time. She appears well-developed and well-nourished. No distress.  HENT:  Head: Normocephalic and atraumatic.  Mouth/Throat: Oropharynx is clear and moist. No oropharyngeal exudate.  Eyes: Right eye exhibits no discharge. Left eye exhibits no discharge. No scleral icterus.  Neck: Normal range of motion.  Cardiovascular:  Normal rate, regular rhythm and normal heart sounds.   No murmur heard. Pulmonary/Chest: Effort normal and breath sounds normal. No respiratory distress.  Abdominal: Soft. Normal appearance. There is tenderness in the left upper quadrant. There is no rigidity, no guarding, no tenderness at McBurney's point and negative Murphy's sign.  Mild left upper quadrant tenderness. No peritoneal signs.  Musculoskeletal: Normal range of motion. She exhibits no edema or tenderness.  Neurological: She is alert and oriented to person, place, and time. She has normal strength. No cranial nerve deficit or sensory deficit. Coordination normal. GCS eye subscore is 4. GCS verbal subscore is 5. GCS motor subscore is 6.  Patient fully alert, answering questions appropriately in full, clear sentences. Motor strength 5 out of 5 in all major muscle use of upper and lower extremity. Cranial nerves II through XII grossly intact. Distal sensation intact.  Skin: Skin is warm and dry. No rash noted. She is not diaphoretic.  Psychiatric: She has a normal mood and affect.  Nursing note and vitals reviewed.   ED Course  Procedures (including critical care time) Labs Review Labs Reviewed  CBC WITH DIFFERENTIAL/PLATELET - Abnormal; Notable for the following:    WBC 14.3 (*)    Platelets 141 (*)    Neutrophils Relative % 79 (*)    Neutro Abs 11.2 (*)    Monocytes Absolute 1.1 (*)    All other components within normal limits  COMPREHENSIVE METABOLIC PANEL - Abnormal; Notable for the following:    Glucose, Bld 126 (*)    ALT 48 (*)    GFR calc non Af Amer 73 (*)    GFR calc Af Amer 85 (*)    All other components within normal limits  LIPASE, BLOOD  I-STAT TROPOININ, ED    Imaging Review Dg Chest 2 View  12/29/2014   CLINICAL DATA:  Chest discomfort for 3 days since had surgery to repair a splenic artery aneurysm, history smoking  EXAM: CHEST  2 VIEW  COMPARISON:  None ; correlation CT abdomen and pelvis 06/09/2013   FINDINGS: Decreased lung volumes.  Enlargement of cardiac silhouette.  Mediastinal contours and pulmonary vascularity normal.  Peribronchial thickening with bibasilar atelectasis.  Remaining lungs clear.  No pleural effusion or pneumothorax.  Multiple coils identified in the LEFT upper quadrant from embolization of previously seen splenic artery aneurysm.  IMPRESSION: Enlargement of cardiac silhouette.  Bronchitic changes with decreased lung volumes and bibasilar atelectasis.   Electronically Signed   By: Lavonia Dana M.D.   On: 12/29/2014 19:49     EKG Interpretation   Date/Time:  Friday December 29 2014 16:37:42 EST Ventricular Rate:  109 PR Interval:  148 QRS Duration: 86 QT Interval:  312 QTC Calculation: 420 R Axis:   166 Text Interpretation:  Sinus tachycardia Right axis deviation Cannot rule  out Anterior infarct , age undetermined T wave abnormality, consider  lateral ischemia Abnormal ECG No significant change since last tracing  Confirmed by Debby Freiberg [phone]) on 12/29/2014 6:05:43 PM      MDM   Final diagnoses:  Chest discomfort  Post-operative pain    Patient here complaining of postop pain postop day 3 status post coil embolization of splenic artery aneurysm. Patient states her pain is mostly in her left upper abdomen, however she has been having intermittent chest discomfort as well. Patient denying having any shortness of breath, patient admits to having decreased by mouth intake or the past 3 days. In the ER, patient's symptoms are treated with fluids, tachycardia resolves after fluid challenge. Mild tachycardia initially thought to be due to mild dehydration. Patient's troponin is negative, with EKG unremarkable for acute injury or ectopy, no change from previous EKG while in the hospital. Patient is consistently nontachypneic, non-hypoxic in the ER. Chest x-ray negative. No concern for ACS. After reassurance, patient stating she is much more comfortable with the  symptoms she is having, she I discussed further workup with a CT angios of her chest for rule out of PE, patient states that she believes her chest discomfort may be more related to anxiety regarding her abdominal pain, and would prefer to forego the further CT scan. I spoke with Dr. Shanon Rosser with vascular surgery regarding patient's case, Dr. Doren Custard states that it is reasonable to have some postoperative pain in her left abdomen especially after her procedure based on the fact the patient is infarcting part of her spleen, and he states there should be some associated pain with this. He states that vascular will  gladly admit patient for pain control should her symptoms be severe. I discussed this option with patient, she states she would rather go home and manage her pain at home. Patient states the pain medicine she is given in the hospital made her extremely nauseated, and she states she would like to try something more strongly than Tylenol at home. Patient was given hydrocodone here in the ER to help control pain, and given short course of hydrocodone to help her pain control at home.  Patient to be discharged at this time, patient is hemodynamically stable, and symptoms managed in the ER. I discussed return precautions with patient, and strongly encouraged her to follow-up with Dr. Trula Slade. I encouraged patient to call or return to the ER if any worsening of symptoms or should she have any questions or concerns.  BP 118/72 mmHg  Pulse 79  Temp(Src) 99.4 F (37.4 C) (Oral)  Resp 18  SpO2 95%  LMP 08/10/2010  Signed,  Dahlia Bailiff, PA-C 1:21 AM  Patient seen and discussed with Dr. Debby Freiberg, MD  Carrie Mew, PA-C 12/30/14 [phone]

## 2014-12-29 NOTE — Telephone Encounter (Signed)
Phone call from pt.  C/o intermittent sharp pain in left upper abdominal area.  Stated "the pain is worse with a deep breath, when I reposition, and it also takes my breath away."  Reported this has occurred approx. 8-10 times, since her procedure on 2/16.  Stated she also has a "heavy and weak feeling" in her right upper and lower extremity, and in her left upper extremity.   Also c/o discomfort across her left shoulder blade area to mid chest.  Reported temp. 99.4 @ 12:00 PM today.  Also stated she hasn't been able to take narcotic pain medication due to nausea, so has been taking Tylenol.  Reported her right groin is bruised, but soft.   Discussed with Dr. Bridgett Larsson.  Recommended pt. to go to the ER.  Advised pt. To go to ER; she stated she will have her husband drive her there.

## 2014-12-29 NOTE — ED Notes (Addendum)
Pt reports having surgery on Tuesday for embolization of splenic artery. Reports having feeling of weakness/heaviness to right side of body since she woke up this am. Now has same sensation to left arm and has pain to left shoulder into her chest and left side abd pain.

## 2015-01-22 ENCOUNTER — Ambulatory Visit
Admission: RE | Admit: 2015-01-22 | Discharge: 2015-01-22 | Disposition: A | Discharge status: Home / Self Care | Payer: Managed Care, Other (non HMO) | Source: Ambulatory Visit | Attending: Surgery | Admitting: Surgery

## 2015-01-22 DIAGNOSIS — I728 Aneurysm of other specified arteries: Secondary | ICD-10-CM

## 2015-01-22 DIAGNOSIS — Z48812 Encounter for surgical aftercare following surgery on the circulatory system: Secondary | ICD-10-CM

## 2015-01-22 MED ORDER — IOPAMIDOL (ISOVUE-370) INJECTION 76%
75.0000 mL | Freq: Once | INTRAVENOUS | Status: AC | PRN
  Administered 2015-01-22: 75 mL via INTRAVENOUS

## 2015-01-26 ENCOUNTER — Encounter: Payer: Self-pay | Admitting: Surgery

## 2015-01-29 ENCOUNTER — Encounter: Payer: Self-pay | Admitting: Surgery

## 2015-01-29 ENCOUNTER — Ambulatory Visit (INDEPENDENT_AMBULATORY_CARE_PROVIDER_SITE_OTHER): Payer: Managed Care, Other (non HMO) | Admitting: Surgery

## 2015-01-29 VITALS — BP 123/82 | HR 87 | Ht 62.0 in | Wt 194.5 lb

## 2015-01-29 DIAGNOSIS — I728 Aneurysm of other specified arteries: Secondary | ICD-10-CM

## 2015-01-29 DIAGNOSIS — Z48812 Encounter for surgical aftercare following surgery on the circulatory system: Secondary | ICD-10-CM

## 2015-01-29 NOTE — Addendum Note (Signed)
Addended by: Mena Goes on: 01/29/2015 03:25 PM   Modules accepted: Orders

## 2015-01-29 NOTE — Progress Notes (Signed)
Patient name: Peggy Thomas MRN: [id number] DOB: 1966/10/02 Sex: female     Chief Complaint  Patient presents with  . Re-evaluation    1 month f/u - CTA prior    HISTORY OF PRESENT ILLNESS: Patient is back today for follow-up.  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  She has been doing well since her procedure.  She was vaccinated prior to her intervention.  She does report occasional low-grade fevers that responded to Tylenol.  4 days ago she had an episode of nausea and vomiting which she attributes to a flulike illness.  She denies having any pain.  Past Medical History  Diagnosis Date  . Hypocalcemia   . Depression 04/30/2011  . Hypothyroidism 04/30/2011  . History of kidney stones   . Right ureteral stone   . Chronic idiopathic thrombocytopenia     MILD--   HEMATOLOGIST--  DR Marin Olp  . PONV (postoperative nausea and vomiting)   . 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 Cholecystectomy  2000  . Cesarean section  1996 and 1998  . Tubal ligation  1999  . Knee arthroscopy    . Robotic assisted total hysterectomy  10-15-2010    LAPAROSCOPIC /  LYSIS ADHESIONS  . Extracorporeal shock wave lithotripsy  2003  . Cystoscopy with retrograde pyelogram, ureteroscopy and stent placement Right 07/25/2013    Procedure: CYSTOSCOPY WITH RETROGRADE PYELOGRAM, URETEROSCOPY, STONE EXTRACTION AND POSSIBLE STENT PLACEMENT;  Surgeon: Ailene Rud, MD;  Location: [facility];  Service: Urology;  Laterality: Right;  . Abdominal hysterectomy    . Embolization N/A 12/26/2014    Procedure: EMBOLIZATION;  Surgeon: Serafina Mitchell, MD;  Location: [facility] CATH LAB;  Service: Cardiovascular;  Laterality: N/A;  . Peripheral vascular catheterization  12/26/2014    Procedure: VISCERAL ANGIOGRAPHY;  Surgeon: Serafina Mitchell, MD;  Location: [facility] CATH LAB;  Service: Cardiovascular;;    History    Social History  . Marital Status: Married    Spouse Name: N/A  . Number of Children: N/A  . Years of Education: N/A   Occupational History  . Not on file.   Social History Main Topics  . Smoking status: Current Every Day Smoker -- 1.00 packs/day for 30 years    Types: Cigarettes  . Smokeless tobacco: Never Used  . Alcohol Use: Yes     Comment: occasionally/socially  . Drug Use: No  . Sexual Activity:    Partners: Male    Birth Control/ Protection: Surgical     Comment: TLH/LOA   Other Topics Concern  . Not on file   Social History Narrative    Family History  Problem Relation Age of Onset  . Diabetes Mother     type 2  . Asthma Mother   . Cancer Mother     skin cancer  . Cancer Father     bone  . Mental illness Sister     schizophrenia  . Alcohol abuse Maternal Grandmother   . Stroke Maternal Grandmother   . Aneurysm Maternal Grandmother     brain aneurysm  . Other Maternal Grandfather     black lung  . Alzheimer's disease Paternal Grandmother   . Stroke Maternal Aunt     Allergies as of 01/29/2015  . (No Known Allergies)    Current Outpatient Prescriptions on File Prior to Visit  Medication Sig Dispense Refill  .  acetaminophen (TYLENOL) 500 MG tablet Take 1,000 mg by mouth every 6 (six) hours as needed for moderate pain.    Marland Kitchen b complex vitamins tablet Take 1 tablet by mouth as needed (for energy).     . furosemide (LASIX) 20 MG tablet Take 1 tablet (20 mg total) by mouth daily. 30 tablet 3  . HYDROcodone-acetaminophen (NORCO/VICODIN) 5-325 MG per tablet Take 1-2 tablets by mouth every 6 (six) hours as needed for moderate pain or severe pain. 20 tablet 0  . ibuprofen (ADVIL,MOTRIN) 600 MG tablet Take 1 tablet (600 mg total) by mouth every 8 (eight) hours as needed for headache, mild pain or moderate pain. 40 tablet 2  . levothyroxine (SYNTHROID, LEVOTHROID) 75 MCG tablet TAKE 1 TABLET (75 MCG TOTAL) BY MOUTH DAILY BEFORE BREAKFAST. 30 tablet 2  .  ondansetron (ZOFRAN) 4 MG tablet Take 1 tablet (4 mg total) by mouth every 6 (six) hours. (Patient not taking: Reported on 01/29/2015) 12 tablet 0  . senna (SENOKOT) 8.6 MG tablet Take 2 tablets by mouth as needed for constipation.     No current facility-administered medications on file prior to visit.     REVIEW OF SYSTEMS: Please see history of present illness otherwise all negative  PHYSICAL EXAMINATION:   Vital signs are BP 123/82 mmHg  Pulse 87  Ht 5\' 2"  (1.575 m)  Wt 194 lb 8 oz (88.225 kg)  BMI 35.57 kg/m2  SpO2 97%  LMP 08/10/2010 General: The patient appears their stated age. HEENT:  No gross abnormalities Pulmonary:  Non labored breathing Abdomen: Soft and non-tender.  No left upper quadrant tenderness Musculoskeletal: There are no major deformities. Neurologic: No focal weakness or paresthesias are detected, Skin: There are no ulcer or rashes noted. Psychiatric: The patient has normal affect. Cardiovascular: There is a regular rate and rhythm without significant murmur appreciated.   Diagnostic Studies I have reviewed her CT scan which shows successful coil embolization of her splenic artery aneurysm.  She does have a large fluid collection within the midportion of the spleen  Assessment: Status post coil embolization of splenic artery aneurysm Plan: I discussed the CT scan findings with the patient.  She does report occasional fevers.  She did have 1 episode of nausea and vomiting several days ago which sounds more like a flulike illness.  I have asked her to monitor her fever curve for the next 1-2 weeks.  She will contact me with the results.  If she continues to be afebrile and without pain, I would not recommend any intervention on the area and her spleen.  If she has persistent fevers, percutaneous aspiration versus drainage may be required.  She will have a CT scan follow-up in 6 months  V. Leia Alf, M.D. [facility]  [place] Office: [phone] Pager:  [phone]

## 2015-01-31 ENCOUNTER — Telehealth: Payer: Self-pay

## 2015-01-31 ENCOUNTER — Encounter (HOSPITAL_COMMUNITY): Payer: Self-pay

## 2015-01-31 ENCOUNTER — Inpatient Hospital Stay (HOSPITAL_COMMUNITY): Payer: Managed Care, Other (non HMO)

## 2015-01-31 ENCOUNTER — Inpatient Hospital Stay (HOSPITAL_COMMUNITY)
Admission: AD | Admit: 2015-01-31 | Discharge: 2015-02-11 | DRG: 981 | Disposition: A | Discharge status: Home / Self Care | Payer: Managed Care, Other (non HMO) | Source: Ambulatory Visit | Attending: Surgery | Admitting: Surgery

## 2015-01-31 DIAGNOSIS — D62 Acute posthemorrhagic anemia: Secondary | ICD-10-CM | POA: Diagnosis not present

## 2015-01-31 DIAGNOSIS — F17210 Nicotine dependence, cigarettes, uncomplicated: Secondary | ICD-10-CM | POA: Diagnosis present

## 2015-01-31 DIAGNOSIS — J9811 Atelectasis: Secondary | ICD-10-CM | POA: Diagnosis not present

## 2015-01-31 DIAGNOSIS — D733 Abscess of spleen: Principal | ICD-10-CM

## 2015-01-31 DIAGNOSIS — J189 Pneumonia, unspecified organism: Secondary | ICD-10-CM | POA: Diagnosis not present

## 2015-01-31 DIAGNOSIS — E039 Hypothyroidism, unspecified: Secondary | ICD-10-CM | POA: Diagnosis present

## 2015-01-31 DIAGNOSIS — Y95 Nosocomial condition: Secondary | ICD-10-CM | POA: Diagnosis not present

## 2015-01-31 DIAGNOSIS — Z09 Encounter for follow-up examination after completed treatment for conditions other than malignant neoplasm: Secondary | ICD-10-CM

## 2015-01-31 DIAGNOSIS — J90 Pleural effusion, not elsewhere classified: Secondary | ICD-10-CM | POA: Diagnosis present

## 2015-01-31 DIAGNOSIS — J9601 Acute respiratory failure with hypoxia: Secondary | ICD-10-CM | POA: Diagnosis not present

## 2015-01-31 DIAGNOSIS — J948 Other specified pleural conditions: Secondary | ICD-10-CM | POA: Diagnosis not present

## 2015-01-31 DIAGNOSIS — I728 Aneurysm of other specified arteries: Secondary | ICD-10-CM | POA: Diagnosis not present

## 2015-01-31 DIAGNOSIS — B9689 Other specified bacterial agents as the cause of diseases classified elsewhere: Secondary | ICD-10-CM | POA: Diagnosis not present

## 2015-01-31 DIAGNOSIS — D693 Immune thrombocytopenic purpura: Secondary | ICD-10-CM | POA: Diagnosis present

## 2015-01-31 DIAGNOSIS — J869 Pyothorax without fistula: Secondary | ICD-10-CM

## 2015-01-31 DIAGNOSIS — R0600 Dyspnea, unspecified: Secondary | ICD-10-CM | POA: Insufficient documentation

## 2015-01-31 DIAGNOSIS — N390 Urinary tract infection, site not specified: Secondary | ICD-10-CM | POA: Diagnosis not present

## 2015-01-31 DIAGNOSIS — R109 Unspecified abdominal pain: Secondary | ICD-10-CM

## 2015-01-31 DIAGNOSIS — Z4682 Encounter for fitting and adjustment of non-vascular catheter: Secondary | ICD-10-CM

## 2015-01-31 DIAGNOSIS — B9561 Methicillin susceptible Staphylococcus aureus infection as the cause of diseases classified elsewhere: Secondary | ICD-10-CM | POA: Diagnosis not present

## 2015-01-31 DIAGNOSIS — R079 Chest pain, unspecified: Secondary | ICD-10-CM | POA: Diagnosis present

## 2015-01-31 LAB — COMPREHENSIVE METABOLIC PANEL
ALT: 72 U/L — ABNORMAL HIGH (ref 0–35)
ANION GAP: 10 (ref 5–15)
AST: 42 U/L — AB (ref 0–37)
Albumin: 3.4 g/dL — ABNORMAL LOW (ref 3.5–5.2)
Alkaline Phosphatase: 105 U/L (ref 39–117)
BILIRUBIN TOTAL: 0.6 mg/dL (ref 0.3–1.2)
BUN: 11 mg/dL (ref 6–23)
CHLORIDE: 103 mmol/L (ref 96–112)
CO2: 26 mmol/L (ref 19–32)
Calcium: 9.3 mg/dL (ref 8.4–10.5)
Creatinine, Ser: 0.87 mg/dL (ref 0.50–1.10)
GFR, EST AFRICAN AMERICAN: 90 mL/min — AB (ref >90)
GFR, EST NON AFRICAN AMERICAN: 78 mL/min — AB (ref >90)
Glucose, Bld: 143 mg/dL — ABNORMAL HIGH (ref 70–99)
Potassium: 3.7 mmol/L (ref 3.5–5.1)
SODIUM: 139 mmol/L (ref 135–145)
Total Protein: 7.4 g/dL (ref 6.0–8.3)

## 2015-01-31 LAB — URINALYSIS, ROUTINE W REFLEX MICROSCOPIC
BILIRUBIN URINE: NEGATIVE
GLUCOSE, UA: NEGATIVE mg/dL
Hgb urine dipstick: NEGATIVE
Ketones, ur: NEGATIVE mg/dL
Leukocytes, UA: NEGATIVE
NITRITE: NEGATIVE
PH: 7.5 (ref 5.0–8.0)
Protein, ur: NEGATIVE mg/dL
Urobilinogen, UA: 0.2 mg/dL (ref 0.0–1.0)

## 2015-01-31 LAB — CBC
HEMATOCRIT: 41.1 % (ref 36.0–46.0)
Hemoglobin: 14 g/dL (ref 12.0–15.0)
MCH: 31.2 pg (ref 26.0–34.0)
MCHC: 34.1 g/dL (ref 30.0–36.0)
MCV: 91.5 fL (ref 78.0–100.0)
PLATELETS: 132 10*3/uL — AB (ref 150–400)
RBC: 4.49 MIL/uL (ref 3.87–5.11)
RDW: 13 % (ref 11.5–15.5)
WBC: 11.9 10*3/uL — AB (ref 4.0–10.5)

## 2015-01-31 LAB — PROTIME-INR
INR: 1.1 (ref 0.00–1.49)
PROTHROMBIN TIME: 14.3 s (ref 11.6–15.2)

## 2015-01-31 MED ORDER — ONDANSETRON HCL 4 MG/2ML IJ SOLN
4.0000 mg | Freq: Four times a day (QID) | INTRAMUSCULAR | Status: DC | PRN
  Administered 2015-02-01 – 2015-02-04 (×3): 4 mg via INTRAVENOUS
  Filled 2015-01-31 (×2): qty 2

## 2015-01-31 MED ORDER — IOHEXOL 350 MG/ML SOLN
100.0000 mL | Freq: Once | INTRAVENOUS | Status: AC | PRN
  Administered 2015-01-31: 100 mL via INTRAVENOUS

## 2015-01-31 MED ORDER — PANTOPRAZOLE SODIUM 40 MG PO TBEC
40.0000 mg | DELAYED_RELEASE_TABLET | Freq: Every day | ORAL | Status: DC
  Administered 2015-02-01 – 2015-02-11 (×11): 40 mg via ORAL
  Filled 2015-01-31 (×8): qty 1

## 2015-01-31 MED ORDER — ACETAMINOPHEN 325 MG PO TABS
325.0000 mg | ORAL_TABLET | Freq: Four times a day (QID) | ORAL | Status: DC | PRN
  Administered 2015-02-01: 325 mg via ORAL
  Administered 2015-02-02 – 2015-02-04 (×4): 650 mg via ORAL
  Filled 2015-01-31: qty 2
  Filled 2015-01-31: qty 1
  Filled 2015-01-31 (×3): qty 2

## 2015-01-31 MED ORDER — PHENOL 1.4 % MT LIQD
1.0000 | OROMUCOSAL | Status: DC | PRN
  Filled 2015-01-31: qty 177

## 2015-01-31 MED ORDER — BISACODYL 10 MG RE SUPP: 10.0000 mg | Freq: Every day | RECTAL | Status: DC | PRN

## 2015-01-31 MED ORDER — SODIUM CHLORIDE 0.9 % IV SOLN
INTRAVENOUS | Status: DC
  Administered 2015-01-31 – 2015-02-01 (×2): via INTRAVENOUS

## 2015-01-31 MED ORDER — SENNOSIDES-DOCUSATE SODIUM 8.6-50 MG PO TABS
1.0000 | ORAL_TABLET | Freq: Every evening | ORAL | Status: DC | PRN
  Administered 2015-02-03 – 2015-02-04 (×2): 1 via ORAL
  Filled 2015-01-31 (×3): qty 1

## 2015-01-31 MED ORDER — IOHEXOL 300 MG/ML  SOLN: 100.0000 mL | Freq: Once | INTRAMUSCULAR | Status: DC | PRN

## 2015-01-31 MED ORDER — HYDRALAZINE HCL 20 MG/ML IJ SOLN: 5.0000 mg | INTRAMUSCULAR | Status: DC | PRN

## 2015-01-31 MED ORDER — OXYCODONE HCL 5 MG PO TABS
5.0000 mg | ORAL_TABLET | ORAL | Status: DC | PRN
  Administered 2015-02-01: 5 mg via ORAL
  Administered 2015-02-02: 10 mg via ORAL
  Administered 2015-02-02: 5 mg via ORAL
  Administered 2015-02-02: 10 mg via ORAL
  Administered 2015-02-02: 5 mg via ORAL
  Administered 2015-02-03 – 2015-02-04 (×3): 10 mg via ORAL
  Administered 2015-02-04: 5 mg via ORAL
  Administered 2015-02-04 – 2015-02-06 (×7): 10 mg via ORAL
  Filled 2015-01-31: qty 2
  Filled 2015-01-31: qty 1
  Filled 2015-01-31 (×8): qty 2
  Filled 2015-01-31: qty 1
  Filled 2015-01-31 (×6): qty 2

## 2015-01-31 MED ORDER — ALUM & MAG HYDROXIDE-SIMETH 200-200-20 MG/5ML PO SUSP: 15.0000 mL | ORAL | Status: DC | PRN

## 2015-01-31 MED ORDER — GUAIFENESIN-DM 100-10 MG/5ML PO SYRP: 15.0000 mL | ORAL_SOLUTION | ORAL | Status: DC | PRN

## 2015-01-31 MED ORDER — MORPHINE SULFATE 2 MG/ML IJ SOLN
2.0000 mg | INTRAMUSCULAR | Status: DC | PRN
  Administered 2015-02-01: 3 mg via INTRAVENOUS
  Filled 2015-01-31: qty 2

## 2015-01-31 MED ORDER — POTASSIUM CHLORIDE CRYS ER 20 MEQ PO TBCR: 20.0000 meq | EXTENDED_RELEASE_TABLET | Freq: Once | ORAL | Status: DC

## 2015-01-31 MED ORDER — LABETALOL HCL 5 MG/ML IV SOLN
10.0000 mg | INTRAVENOUS | Status: DC | PRN
  Filled 2015-01-31: qty 4

## 2015-01-31 MED ORDER — METOPROLOL TARTRATE 1 MG/ML IV SOLN: 2.0000 mg | INTRAVENOUS | Status: DC | PRN

## 2015-01-31 NOTE — Addendum Note (Signed)
Addended by: Gabriel Earing on: 01/31/2015 01:04 PM   Modules accepted: Orders

## 2015-01-31 NOTE — Telephone Encounter (Signed)
Phone call from pt.  Reported she had a fever last PM of 101.7 degrees.  C/o chills, and nausea.  Denies vomiting.  Reported she has had a low grade fever since the procedure on her splenic artery aneurysm.  Discussed symptoms with Dr. Trula Slade.  Advised to admit pt. to [facility] with diagnosis of splenic abscess.  Recommended  NPO, CTA Abd/ pelvis today, and IR Consult for splenic abscess. Will page PA for [facility] to write hospital orders. Pt. notified, and agrees with plan for admission.

## 2015-02-01 ENCOUNTER — Inpatient Hospital Stay (HOSPITAL_COMMUNITY): Payer: Managed Care, Other (non HMO)

## 2015-02-01 ENCOUNTER — Encounter (HOSPITAL_COMMUNITY): Payer: Self-pay | Admitting: Radiology

## 2015-02-01 MED ORDER — PIPERACILLIN-TAZOBACTAM 3.375 G IVPB
3.3750 g | Freq: Three times a day (TID) | INTRAVENOUS | Status: DC
  Administered 2015-02-01 – 2015-02-04 (×8): 3.375 g via INTRAVENOUS
  Filled 2015-02-01 (×10): qty 50

## 2015-02-01 MED ORDER — LIDOCAINE HCL 1 % IJ SOLN
INTRAMUSCULAR | Status: AC
  Filled 2015-02-01: qty 20

## 2015-02-01 MED ORDER — ONDANSETRON HCL 4 MG/2ML IJ SOLN
INTRAMUSCULAR | Status: AC
  Filled 2015-02-01: qty 2

## 2015-02-01 MED ORDER — FENTANYL CITRATE 0.05 MG/ML IJ SOLN
INTRAMUSCULAR | Status: AC
  Filled 2015-02-01: qty 2

## 2015-02-01 MED ORDER — MIDAZOLAM HCL 2 MG/2ML IJ SOLN
INTRAMUSCULAR | Status: AC
  Filled 2015-02-01: qty 2

## 2015-02-01 MED ORDER — MIDAZOLAM HCL 2 MG/2ML IJ SOLN
INTRAMUSCULAR | Status: AC | PRN
  Administered 2015-02-01: 1 mg via INTRAVENOUS

## 2015-02-01 MED ORDER — HYDROMORPHONE HCL 1 MG/ML IJ SOLN
0.5000 mg | INTRAMUSCULAR | Status: DC | PRN
  Administered 2015-02-01: 0.5 mg via INTRAVENOUS
  Administered 2015-02-01 – 2015-02-02 (×4): 1 mg via INTRAVENOUS
  Filled 2015-02-01 (×7): qty 1

## 2015-02-01 MED ORDER — ONDANSETRON HCL 4 MG/2ML IJ SOLN
INTRAMUSCULAR | Status: AC | PRN
  Administered 2015-02-01: 4 mg via INTRAVENOUS

## 2015-02-01 MED ORDER — FENTANYL CITRATE 0.05 MG/ML IJ SOLN
INTRAMUSCULAR | Status: AC | PRN
  Administered 2015-02-01: 50 ug via INTRAVENOUS

## 2015-02-01 MED ORDER — ONDANSETRON HCL 4 MG/2ML IJ SOLN
4.0000 mg | Freq: Once | INTRAMUSCULAR | Status: AC
  Administered 2015-02-06: 4 mg via INTRAVENOUS

## 2015-02-01 MED ORDER — HYDROMORPHONE HCL 1 MG/ML IJ SOLN
INTRAMUSCULAR | Status: AC
  Administered 2015-02-01: 0.5 mg
  Filled 2015-02-01: qty 1

## 2015-02-01 NOTE — H&P (Signed)
Reason for Consult: Perisplenic fluid collection post embolization of splenic aneurysm   Referring Physician(s): Vascular  History of Present Illness: Peggy Thomas is a 49 y.o. female who is S/p embolization of splenic artery aneurysm by vascular on 12/26/14. She has been c/o fevers starting last Thursday with increased N/V and fatigue. She also c/o intermittent "aching" LUQ pain. F/U imaging revealed low density area concerning for possible abscess with fevers and leukocytosis, IR received request for consult, she is scheduled today for CT guided periplenic fluid collection aspiration with possible drain placement. She denies any chest pain, shortness of breath or palpitations. She denies any active signs of bleeding or excessive bruising. The patient denies any history of sleep apnea or chronic oxygen use. She has previously tolerated sedation without complications, but does admit to nausea with pain medication.     Past Medical History  Diagnosis Date  . Hypocalcemia   . Depression 04/30/2011  . Hypothyroidism 04/30/2011  . History of kidney stones   . Right ureteral stone   . Chronic idiopathic thrombocytopenia     MILD--   HEMATOLOGIST--  DR Marin Olp  . PONV (postoperative nausea and vomiting)   . 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 Cholecystectomy  2000  . Cesarean section  1996 and 1998  . Tubal ligation  1999  . Knee arthroscopy    . Robotic assisted total hysterectomy  10-15-2010    LAPAROSCOPIC /  LYSIS ADHESIONS  . Extracorporeal shock wave lithotripsy  2003  . Cystoscopy with retrograde pyelogram, ureteroscopy and stent placement Right 07/25/2013    Procedure: CYSTOSCOPY WITH RETROGRADE PYELOGRAM, URETEROSCOPY, STONE EXTRACTION AND POSSIBLE STENT PLACEMENT;  Surgeon: Ailene Rud, MD;  Location: [facility];  Service: Urology;  Laterality: Right;  . Abdominal hysterectomy    . Embolization  N/A 12/26/2014    Procedure: EMBOLIZATION;  Surgeon: Serafina Mitchell, MD;  Location: [facility] CATH LAB;  Service: Cardiovascular;  Laterality: N/A;  . Peripheral vascular catheterization  12/26/2014    Procedure: VISCERAL ANGIOGRAPHY;  Surgeon: Serafina Mitchell, MD;  Location: [facility] CATH LAB;  Service: Cardiovascular;;    Allergies: Review of patient's allergies indicates no known allergies.  Medications: Prior to Admission medications   Medication Sig Start Date End Date Taking? Authorizing Provider  acetaminophen (TYLENOL) 500 MG tablet Take 1,000 mg by mouth every 6 (six) hours as needed for moderate pain.   Yes Historical Provider, MD  b complex vitamins tablet Take 1 tablet by mouth as needed (for energy).    Yes Historical Provider, MD  furosemide (LASIX) 20 MG tablet Take 1 tablet (20 mg total) by mouth daily. Patient taking differently: Take 20 mg by mouth daily as needed for fluid.  03/31/14  Yes Mosie Lukes, MD  ibuprofen (ADVIL,MOTRIN) 600 MG tablet Take 1 tablet (600 mg total) by mouth every 8 (eight) hours as needed for headache, mild pain or moderate pain. 03/31/14  Yes Mosie Lukes, MD  levothyroxine (SYNTHROID, LEVOTHROID) 75 MCG tablet TAKE 1 TABLET (75 MCG TOTAL) BY MOUTH DAILY BEFORE BREAKFAST. 12/11/14  Yes Mosie Lukes, MD  HYDROcodone-acetaminophen (NORCO/VICODIN) 5-325 MG per tablet Take 1-2 tablets by mouth every 6 (six) hours as needed for moderate pain or severe pain. Patient not taking: Reported on 01/31/2015 12/29/14   Dahlia Bailiff, PA-C  ondansetron (ZOFRAN) 4 MG tablet Take 1 tablet (4 mg total) by mouth every  6 (six) hours. Patient not taking: Reported on 01/29/2015 12/29/14   Dahlia Bailiff, PA-C     Family History  Problem Relation Age of Onset  . Diabetes Mother     type 2  . Asthma Mother   . Cancer Mother     skin cancer  . Cancer Father     bone  . Mental illness Sister     schizophrenia  . Alcohol abuse Maternal Grandmother   . Stroke Maternal Grandmother   .  Aneurysm Maternal Grandmother     brain aneurysm  . Other Maternal Grandfather     black lung  . Alzheimer's disease Paternal Grandmother   . Stroke Maternal Aunt     History   Social History  . Marital Status: Married    Spouse Name: N/A  . Number of Children: N/A  . Years of Education: N/A   Social History Main Topics  . Smoking status: Current Every Day Smoker -- 1.00 packs/day for 30 years    Types: Cigarettes  . Smokeless tobacco: Never Used  . Alcohol Use: Yes     Comment: occasionally/socially  . Drug Use: No  . Sexual Activity:    Partners: Male    Birth Control/ Protection: Surgical     Comment: TLH/LOA   Other Topics Concern  . None   Social History Narrative   Review of Systems: A 12 point ROS discussed and pertinent positives are indicated in the HPI above.  All other systems are negative.  Review of Systems  Vital Signs: BP 106/61 mmHg  Pulse 65  Temp(Src) 98.3 F (36.8 C) (Oral)  Resp 18  Ht 5\' 2"  (1.575 m)  Wt 190 lb 14.4 oz (86.592 kg)  BMI 34.91 kg/m2  SpO2 96%  LMP 08/10/2010  Physical Exam  Constitutional: She is oriented to person, place, and time. No distress.  HENT:  Head: Normocephalic and atraumatic.  Neck: No tracheal deviation present.  Cardiovascular: Normal rate and regular rhythm.  Exam reveals no gallop and no friction rub.   No murmur heard. Pulmonary/Chest: Effort normal and breath sounds normal. No respiratory distress. She has no wheezes. She has no rales.  Abdominal: Soft. Bowel sounds are normal. She exhibits no distension. There is no tenderness.  Neurological: She is alert and oriented to person, place, and time.  Skin: She is not diaphoretic.    Mallampati Score:  MD Evaluation Airway: WNL Heart: WNL Abdomen: WNL Chest/ Lungs: WNL ASA  Classification: 2 Mallampati/Airway Score: Two  Imaging: Ct Angio Abdomen W/cm &/or Wo Contrast  01/22/2015   CLINICAL DATA:  Status post coil embolization of splenic  artery aneurysm.  EXAM: CT ANGIOGRAPHY ABDOMEN  TECHNIQUE: Multidetector CT imaging of the abdomen was performed using the standard protocol during bolus administration of intravenous contrast. Multiplanar reconstructed images including MIPs were obtained and reviewed to evaluate the vascular anatomy.  CONTRAST:  75 mL of Isovue 370 intravenously.  COMPARISON:  CT scan of November 27, 2014.  FINDINGS: Visualized lung bases appear normal. No significant osseous abnormality is noted.  Visualized portion of abdominal aorta and mesenteric arteries appear normal. Renal arteries appear normal with symmetric enhancement of the kidneys. Status post cholecystectomy. No focal abnormality is noted in the liver or pancreas. Adrenal glands appear normal. Stable nonobstructive left nephrolithiasis. No hydronephrosis or renal obstruction is noted. The appendix appears normal.  Status post coil embolization of aneurysm involving distal splenic artery. Evaluation of this area is limited due to scatter artifact arising from  coils. However, 8.9 x 5.2 cm low density area is now seen inferiorly in the spleen which is developed since prior exam. Given the reason embolization, this is concerning for subacute to chronic infarction. Alternatively, this may represent septated fluid collection and possible abscess.  Review of the MIP images confirms the above findings.  IMPRESSION: Stable nonobstructive left nephrolithiasis.  Status post coil embolization of distal splenic artery aneurysm. There is interval development of oval-shaped low density inferiorly within the spleen which may represent subacute to chronic infarction, or possibly septated fluid collection, or possibly abscess if there are clinical signs of infection. These results will be called to the ordering clinician or representative by the Radiologist Assistant, and communication documented in the PACS or zVision Dashboard.   Electronically Signed   By: Marijo Conception, M.D.   On:  01/22/2015 10:23   Ct Angio Abd/pel W/ And/or W/o  01/31/2015   CLINICAL DATA:  Prior coil embolization of splenic artery aneurysm, with acute onset of low grade fever, nausea and vomiting. Assess for splenic abscess.  EXAM: CTA ABDOMEN AND PELVIS wITHOUT AND WITH CONTRAST  TECHNIQUE: Multidetector CT imaging of the abdomen and pelvis was performed using the standard protocol during bolus administration of intravenous contrast. Multiplanar reconstructed images and MIPs were obtained and reviewed to evaluate the vascular anatomy.  CONTRAST:  123mL OMNIPAQUE IOHEXOL 350 MG/ML SOLN  COMPARISON:  CTA of the abdomen and pelvis from 01/22/2015  FINDINGS: The abdominal aorta is unremarkable in appearance. There is no evidence of aortic dissection or aneurysmal dilatation. There is no evidence of calcific atherosclerotic disease. The celiac trunk, superior mesenteric artery, bilateral renal arteries and inferior mesenteric artery are unremarkable in appearance. Extensive coils are again noted at the splenic hilum, at the prior site of the patient's splenic artery aneurysm.  The inferior vena cava is unremarkable in appearance. The portal venous system is unremarkable in appearance.  The visualized lung bases are clear.  The region of decreased attenuation within the spleen is stable or very slightly decreased in size, measuring approximately 8.5 x 7.3 x 4.7 cm. Mild surrounding soft tissue inflammation is perhaps slightly improved. This is favored to reflect an evolving splenic infarct. Septated fluid collection or abscess is considered less likely. No abnormal peripheral enhancement is seen.  The liver is unremarkable in appearance. The patient is status post cholecystectomy, with clips noted along the gallbladder fossa. The pancreas and adrenal glands are unremarkable.  A nonobstructing 8 mm stone is noted at the lower pole of the left kidney. A smaller 6 mm stone is noted at the interpole region of the left kidney.  The kidneys are otherwise unremarkable in appearance. No hydronephrosis is seen. No obstructing ureteral stones are identified. No perinephric stranding is appreciated.  No free fluid is identified. The small bowel is unremarkable in appearance. The stomach is within normal limits. No acute vascular abnormalities are seen.  The appendix is normal in caliber, without evidence of appendicitis. The colon is unremarkable in appearance.  The bladder is mildly distended and grossly unremarkable. The patient is status post hysterectomy. No suspicious adnexal masses are seen. The ovaries are relatively symmetric in appearance. No inguinal lymphadenopathy is seen.  No acute osseous abnormalities are identified. 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  Review of the MIP images confirms the above findings.  IMPRESSION: 1. Region of decreased attenuation within the spleen is stable or very slightly decreased  in size from the prior CT, measuring 8.5 x 7.3 x 4.7 cm. Mild surrounding soft tissue inflammation is perhaps slightly improved. This is favored to reflect an evolving splenic infarct. A septated fluid collection or abscess is considered less likely. No abnormal peripheral enhancement seen. 2. Extensive coils again noted at the splenic hilum. No acute vascular abnormality seen. 3. Nonobstructing left renal stones seen, measuring up to 8 mm in size 4. 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   Electronically Signed   By: Garald Balding M.D.   On: 01/31/2015 19:21    Labs:  CBC:  Recent Labs  03/31/14 1044 12/26/14 0735 12/27/14 0522 12/29/14 1641 01/31/15 1725  WBC 7.5  --  13.0* 14.3* 11.9*  HGB 16.2* 15.3* 12.5 14.4 14.0  HCT 45.5 45.0 36.2 41.0 41.1  PLT 141*  --  107* 141* 132*    COAGS:  Recent Labs  01/31/15 1725  INR 1.10    BMP:  Recent Labs   03/31/14 1044 12/26/14 0735 12/27/14 0522 12/29/14 1641 01/31/15 1725  NA 136 141 136 135 139  K 4.1 4.2 3.6 4.0 3.7  CL 104 104 106 100 103  CO2 26  --  26 24 26   GLUCOSE 83 97 127* 126* 143*  BUN 11 14 8 6 11   CALCIUM 9.0  --  8.5 9.1 9.3  CREATININE 0.83 0.90 0.83 0.91 0.87  GFRNONAA  --   --  82* 73* 78*  GFRAA  --   --  >90 85* 90*    LIVER FUNCTION TESTS:  Recent Labs  03/31/14 1044 12/29/14 1641 01/31/15 1725  BILITOT 0.5 1.0 0.6  AST 17 27 42*  ALT 23 48* 72*  ALKPHOS 50 57 105  PROT 6.6 7.9 7.4  ALBUMIN 4.2  4.2 3.7 3.4*   Assessment and Plan: S/p embolization of splenic artery aneurysm by vascular 12/26/14 New fevers, leukocytosis with fatigue and nausea, intermittent LUQ pain F/U imaging revealed low density area concerning for possible abscess IR received request for consult Scheduled today for CT guided perisplenic fluid collection aspiration with possible drain placement with moderate sedation The patient has been NPO, no blood thinners taken, labs and vitals have been reviewed. Risks and Benefits discussed with the patient including bleeding, infection, damage to adjacent structures. All of the patient's questions were answered, patient is agreeable to proceed. Consent signed and in chart.   Thank you for this interesting consult.  I greatly enjoyed meeting LINCY BELLES and look forward to participating in their care.  SignedHedy Jacob 02/01/2015, 10:01 AM   I spent a total of 20 Minutes in face to face in clinical consultation, greater than 50% of which was counseling/coordinating care for

## 2015-02-01 NOTE — Progress Notes (Signed)
    Subjective  -   Patient complaining of pain from her procedure this morning.   Physical Exam:  Abdomen soft Respirations nonlabored       Assessment/Plan:    Patient has been having persistent fevers following coil embolization of her splenic artery.  CT scan showed a possible fluid collection.  Although this had decreased in size on repeat scan, with her persistent fevers and chills, I elected to have this aspirated which radiology did earlier today.  Blood cultures from yesterday remained negative.  The patient will be placed on antibiotics.  These will be tailored to the specific organism based on culture results.  If she appears to be feeling adequate tomorrow she could potentially go home.  BRABHAM IV, V. WELLS 02/01/2015 5:14 PM --  Filed Vitals:   02/01/15 1402  BP: 101/62  Pulse: 72  Temp: 98.4 F (36.9 C)  Resp: 18    Intake/Output Summary (Last 24 hours) at 02/01/15 1714 Last data filed at 02/01/15 1300  Gross per 24 hour  Intake    120 ml  Output    250 ml  Net   -130 ml     Laboratory CBC    Component Value Date/Time   WBC 11.9* 01/31/2015 1725   WBC 6.3 06/11/2012 1012   HGB 14.0 01/31/2015 1725   HGB 15.6 06/11/2012 1012   HCT 41.1 01/31/2015 1725   HCT 44.0 06/11/2012 1012   PLT 132* 01/31/2015 1725   PLT 108* 06/11/2012 1012    BMET    Component Value Date/Time   NA 139 01/31/2015 1725   K 3.7 01/31/2015 1725   CL 103 01/31/2015 1725   CO2 26 01/31/2015 1725   GLUCOSE 143* 01/31/2015 1725   BUN 11 01/31/2015 1725   CREATININE 0.87 01/31/2015 1725   CREATININE 0.83 03/31/2014 1044   CALCIUM 9.3 01/31/2015 1725   GFRNONAA 78* 01/31/2015 1725   GFRAA 90* 01/31/2015 1725    COAG Lab Results  Component Value Date   INR 1.10 01/31/2015   No results found for: PTT  Antibiotics Anti-infectives    None       V. Leia Alf, M.D. [facility] [place] Office: [phone] Pager:   [phone]

## 2015-02-01 NOTE — H&P (Signed)
Patient name: Peggy Ucci DavisMRN: [id number] DOB: [birth year]Sex: female    Chief Complaint  Patient presents with  . Re-evaluation    1 month f/u - CTA prior    HISTORY OF PRESENT ILLNESS: Patient is back today for follow-up. 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 She has been doing well since her procedure. She was vaccinated prior to her intervention. She does report occasional low-grade fevers that responded to Tylenol. 4 days ago she had an episode of nausea and vomiting which she attributes to a flulike illness. She denies having any pain.  Past Medical History  Diagnosis Date  . Hypocalcemia   . Depression 04/30/2011  . Hypothyroidism 04/30/2011  . History of kidney stones   . Right ureteral stone   . Chronic idiopathic thrombocytopenia     MILD-- HEMATOLOGIST-- DR Marin Olp  . PONV (postoperative nausea and vomiting)   . 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 Cholecystectomy  2000  . Cesarean section  1996 and 1998  . Tubal ligation  1999  . Knee arthroscopy    . Robotic assisted total hysterectomy  10-15-2010    LAPAROSCOPIC / LYSIS ADHESIONS  . Extracorporeal shock wave lithotripsy  2003  . Cystoscopy with retrograde pyelogram, ureteroscopy and stent placement Right 07/25/2013    Procedure: CYSTOSCOPY WITH RETROGRADE PYELOGRAM, URETEROSCOPY, STONE EXTRACTION AND POSSIBLE STENT PLACEMENT; Surgeon: Ailene Rud, MD; Location: [facility]; Service: Urology; Laterality: Right;  . Abdominal hysterectomy    . Embolization N/A 12/26/2014    Procedure: EMBOLIZATION; Surgeon: Serafina Mitchell, MD; Location: [facility] CATH LAB; Service: Cardiovascular; Laterality: N/A;  . Peripheral vascular catheterization  12/26/2014    Procedure:  VISCERAL ANGIOGRAPHY; Surgeon: Serafina Mitchell, MD; Location: [facility] CATH LAB; Service: Cardiovascular;;    History   Social History  . Marital Status: Married    Spouse Name: N/A  . Number of Children: N/A  . Years of Education: N/A   Occupational History  . Not on file.   Social History Main Topics  . Smoking status: Current Every Day Smoker -- 1.00 packs/day for 30 years    Types: Cigarettes  . Smokeless tobacco: Never Used  . Alcohol Use: Yes     Comment: occasionally/socially  . Drug Use: No  . Sexual Activity:    Partners: Male    Birth Control/ Protection: Surgical     Comment: TLH/LOA   Other Topics Concern  . Not on file   Social History Narrative    Family History  Problem Relation Age of Onset  . Diabetes Mother     type 2  . Asthma Mother   . Cancer Mother     skin cancer  . Cancer Father     bone  . Mental illness Sister     schizophrenia  . Alcohol abuse Maternal Grandmother   . Stroke Maternal Grandmother   . Aneurysm Maternal Grandmother     brain aneurysm  . Other Maternal Grandfather     black lung  . Alzheimer's disease Paternal Grandmother   . Stroke Maternal Aunt     Allergies as of 01/29/2015  . (No Known Allergies)    Current Outpatient Prescriptions on File Prior to Visit  Medication Sig Dispense Refill  . acetaminophen (TYLENOL) 500 MG tablet Take 1,000 mg by mouth every 6 (six) hours as needed for moderate pain.    Marland Kitchen  b complex vitamins tablet Take 1 tablet by mouth as needed (for energy).     . furosemide (LASIX) 20 MG tablet Take 1 tablet (20 mg total) by mouth daily. 30 tablet 3  . HYDROcodone-acetaminophen (NORCO/VICODIN) 5-325 MG per tablet Take 1-2 tablets by mouth every 6 (six) hours as needed for moderate pain or severe pain. 20 tablet 0  . ibuprofen  (ADVIL,MOTRIN) 600 MG tablet Take 1 tablet (600 mg total) by mouth every 8 (eight) hours as needed for headache, mild pain or moderate pain. 40 tablet 2  . levothyroxine (SYNTHROID, LEVOTHROID) 75 MCG tablet TAKE 1 TABLET (75 MCG TOTAL) BY MOUTH DAILY BEFORE BREAKFAST. 30 tablet 2  . ondansetron (ZOFRAN) 4 MG tablet Take 1 tablet (4 mg total) by mouth every 6 (six) hours. (Patient not taking: Reported on 01/29/2015) 12 tablet 0  . senna (SENOKOT) 8.6 MG tablet Take 2 tablets by mouth as needed for constipation.     No current facility-administered medications on file prior to visit.     REVIEW OF SYSTEMS: Please see history of present illness otherwise all negative  PHYSICAL EXAMINATION:  Vital signs are BP 123/82 mmHg  Pulse 87  Ht 5\' 2"  (1.575 m)  Wt 194 lb 8 oz (88.225 kg)  BMI 35.57 kg/m2  SpO2 97%  LMP 08/10/2010 General: The patient appears their stated age. HEENT: No gross abnormalities Pulmonary: Non labored breathing Abdomen: Soft and non-tender. No left upper quadrant tenderness Musculoskeletal: There are no major deformities. Neurologic: No focal weakness or paresthesias are detected, Skin: There are no ulcer or rashes noted. Psychiatric: The patient has normal affect. Cardiovascular: There is a regular rate and rhythm without significant murmur appreciated.   Diagnostic Studies I have reviewed her CT scan which shows successful coil embolization of her splenic artery aneurysm. She does have a large fluid collection within the midportion of the spleen  Assessment: Status post coil embolization of splenic artery aneurysm Plan: I discussed the CT scan findings with the patient. She does report occasional fevers. She did have 1 episode of nausea and vomiting several days ago which sounds more like a flulike illness. I have asked her to monitor her fever curve for the next 1-2 weeks. She will contact me with the results. If she continues to  be afebrile and without pain, I would not recommend any intervention on the area and her spleen. If she has persistent fevers, percutaneous aspiration versus drainage may be required. She will have a CT scan follow-up in 6 months   Persistant fevers.  Will admit for repeat CT and drainage of splenic fluid.  Once the fluid has been aspirated it will be sent for culture and antibiotics will be initiated.    Eldridge Abrahams, M.D. [facility] [place] Office: [phone] Pager: [phone]

## 2015-02-01 NOTE — Progress Notes (Signed)
Utilization review completed. Cereniti Curb, RN, BSN. 

## 2015-02-01 NOTE — Progress Notes (Signed)
ANTIBIOTIC CONSULT NOTE - INITIAL  Pharmacy Consult for zosyn Indication: possible intra-abdominal infection  No Known Allergies  Patient Measurements: Height: 5\' 2"  (157.5 cm) Weight: 190 lb 14.4 oz (86.592 kg) IBW/kg (Calculated) : 50.1   Vital Signs: Temp: 98.4 F (36.9 C) (03/24 1402) Temp Source: Oral (03/24 1402) BP: 101/62 mmHg (03/24 1402) Pulse Rate: 72 (03/24 1402) Intake/Output from previous day: 03/23 0701 - 03/24 0700 In: -  Out: 250 [Urine:250] Intake/Output from this shift: Total I/O In: 120 [P.O.:120] Out: -   Labs:  Recent Labs  01/31/15 1725  WBC 11.9*  HGB 14.0  PLT 132*  CREATININE 0.87   Estimated Creatinine Clearance: 80.8 mL/min (by C-G formula based on Cr of 0.87). No results for input(s): VANCOTROUGH, VANCOPEAK, VANCORANDOM, GENTTROUGH, GENTPEAK, GENTRANDOM, TOBRATROUGH, TOBRAPEAK, TOBRARND, AMIKACINPEAK, AMIKACINTROU, AMIKACIN in the last 72 hours.   Microbiology: Recent Results (from the past 720 hour(s))  Culture, blood (routine x 2)     Status: None (Preliminary result)   Collection Time: 01/31/15  5:25 PM  Result Value Ref Range Status   Specimen Description BLOOD LEFT HAND  Final   Special Requests BOTTLES DRAWN AEROBIC ONLY 8CC  Final   Culture   Final           BLOOD CULTURE RECEIVED NO GROWTH TO DATE CULTURE WILL BE HELD FOR 5 DAYS BEFORE ISSUING A FINAL NEGATIVE REPORT Performed at [facility]    Report Status PENDING  Incomplete  Culture, blood (routine x 2)     Status: None (Preliminary result)   Collection Time: 01/31/15  5:30 PM  Result Value Ref Range Status   Specimen Description BLOOD RIGHT HAND  Final   Special Requests BOTTLES DRAWN AEROBIC ONLY 3CC  Final   Culture   Final           BLOOD CULTURE RECEIVED NO GROWTH TO DATE CULTURE WILL BE HELD FOR 5 DAYS BEFORE ISSUING A FINAL NEGATIVE REPORT Performed at [facility]    Report Status PENDING  Incomplete    Medical History: Past Medical  History  Diagnosis Date  . Hypocalcemia   . Depression 04/30/2011  . Hypothyroidism 04/30/2011  . History of kidney stones   . Right ureteral stone   . Chronic idiopathic thrombocytopenia     MILD--   HEMATOLOGIST--  DR Marin Olp  . PONV (postoperative nausea and vomiting)   . Aneurysm     on spleen-watching     Assessment: 38 yof s/p coiling embolization of splenic aneurysm. Orders to provide broad IV antibiotics overnight and plan to transition to oral in am.   Wbc 11, renal function normal.  Plan:  Zosyn 3.375g IV q8 hours  Erin Hearing PharmD., BCPS Clinical Pharmacist Pager [phone] 02/01/2015 5:27 PM

## 2015-02-01 NOTE — Procedures (Signed)
Interventional Radiology Procedure Note  Procedure:  CT guided splenic aspiration. Fingings: 20cc of dark, viscous fluid aspirated. Complications: No immediate Recommendations:  - Ok to shower tomorrow  - Routine care   Signed,  Dulcy Fanny. Earleen Newport, DO

## 2015-02-02 ENCOUNTER — Encounter (HOSPITAL_COMMUNITY): Payer: Self-pay | Admitting: *Deleted

## 2015-02-02 ENCOUNTER — Inpatient Hospital Stay (HOSPITAL_COMMUNITY): Payer: Managed Care, Other (non HMO)

## 2015-02-02 MED ORDER — IOHEXOL 350 MG/ML SOLN
100.0000 mL | Freq: Once | INTRAVENOUS | Status: AC | PRN
Start: 2015-02-02 — End: 2015-02-02
  Administered 2015-02-02: 100 mL via INTRAVENOUS

## 2015-02-02 MED ORDER — ONDANSETRON HCL 4 MG/2ML IJ SOLN
INTRAMUSCULAR | Status: AC
  Filled 2015-02-02: qty 2

## 2015-02-02 MED ORDER — OXYCODONE HCL 5 MG PO TABS: 5.0000 mg | ORAL_TABLET | ORAL | Status: DC | PRN

## 2015-02-02 MED ORDER — HYDROMORPHONE HCL 1 MG/ML IJ SOLN
INTRAMUSCULAR | Status: AC
  Filled 2015-02-02: qty 1

## 2015-02-02 MED ORDER — AMOXICILLIN-POT CLAVULANATE 875-125 MG PO TABS: 1.0000 | ORAL_TABLET | Freq: Two times a day (BID) | ORAL | Status: DC

## 2015-02-02 NOTE — Progress Notes (Signed)
Patient called RN to room c/o excruciating pain from her procedure on 3/24 (aspiration of spleen abscess).  Notes she feels like she can't catch her breath or take deep breaths and is also c/o chest pain. EKG obtained and PA on call for IR notified. Orders received and RN will also make PA for vascular aware.

## 2015-02-02 NOTE — Progress Notes (Signed)
Patient ID: Peggy Thomas, female   DOB: September 01, 1966, 49 y.o.   MRN: [id number] CT angiogram of chest and abdomen performed and reviewed by Dr. Rozetta Nunnery There is small pleural effusion on the left which probably is sympathetic effusion from irritation beneath the diaphragm from the spleen. Appears to be a small necrotic area in spleen but no abscess formation and no evidence of bleeding. Mild atelectasis left lingula  Will continue to monitor patient and evaluate for possible DC home in a.m.

## 2015-02-02 NOTE — Progress Notes (Signed)
Patient ID: Peggy Thomas, female   DOB: December 24, 1965, 49 y.o.   MRN: [id number] Vascular Surgery Progress Note  Subjective: Patient complaining of left back and chest discomfort. This has been present since procedure yesterday. It originates where the puncture site for the needle biopsy of the spleen was performed. She denies any recent chills.  Objective:  Filed Vitals:   02/02/15 0522  BP: 125/74  Pulse: 87  Temp: 99.1 F (37.3 C)  Resp: 16    Alert and oriented 3 Lungs no rhonchi or wheezing No point tenderness other than puncture site in posterior left back area. Abdomen is soft nontender   Labs:  Recent Labs Lab 01/31/15 1725  CREATININE 0.87    Recent Labs Lab 01/31/15 1725  NA 139  K 3.7  CL 103  CO2 26  BUN 11  CREATININE 0.87  GLUCOSE 143*  CALCIUM 9.3    Recent Labs Lab 01/31/15 1725  WBC 11.9*  HGB 14.0  HCT 41.1  PLT 132*    Recent Labs Lab 01/31/15 1725  INR 1.10    I/O last 3 completed shifts: In: 49 [P.O.:120] Out: 74 [Urine:950]  Imaging: Ct Aspiration  02/01/2015   CLINICAL DATA:  49 year old female with a history of splenic artery aneurysm coil embolization. 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 CT study demonstrates evidence of focal fluid collection at the inferior spleen, which is suspicious for infection given the patient's fever and flank pain.  She has been referred for aspiration.  EXAM: CT GUIDED ASPIRATION BIOPSY OF SPLENIC ABSCESS  ANESTHESIA/SEDATION: 1.0  Mg IV Versed; 50 mcg IV Fentanyl  Total Moderate Sedation Time: 10 minutes.  PROCEDURE: The procedure risks, benefits, and alternatives were explained to the patient. Questions regarding the procedure were encouraged and answered. The patient understands and consents to the procedure.  Patient was positioned in left posterior oblique position on the CT gantry table and a scout CT was obtained.  The left flank was prepped with Betadinein  a sterile fashion, and a sterile drape was applied covering the operative field. A sterile gown and sterile gloves were used for the procedure. Local anesthesia was provided with 1% Lidocaine.  Once the patient is prepped and draped sterilely the skin and subcutaneous tissues were generously infiltrated with 1% lidocaine for local anesthesia. A small stab incision was made with 11 blade scalpel, and then a 10 cm Yueh needle was advanced under CT guidance until the tip was centered in the low-density collection of the inferior spleen.  Once the tip was confirmed to be within the collection, the coaxial needle was removed, and 20 cc of brown fluid was aspirated. The catheter was removed.  Patient tolerated the procedure well and remained hemodynamically stable throughout.  No complications were encountered and no significant blood loss was encountered.  Final CT was performed after removal of the Yueh catheter.  COMPLICATIONS: None  FINDINGS: Scout CT demonstrates low-density collection within the splenic inferior pole. Collection is similar in size to the most recent CT of the prior day.  Nonobstructive nephrolithiasis.  Changes of coil embolization of splenic artery aneurysm  Images during the case demonstrate safe placement of Yueh needle into the low-density collection.  After the needle was removed, no complicating feature was identified.  IMPRESSION: Status post CT-guided splenic abscess aspiration. Sample sent to the lab for complete analysis.  Signed,  Dulcy Fanny. Earleen Newport, DO  [facility]  [facility]   Electronically Signed  By: Corrie Mckusick D.O.   On: 02/01/2015 11:10   Ct Angio Abd/pel W/ And/or W/o  01/31/2015   CLINICAL DATA:  Prior coil embolization of splenic artery aneurysm, with acute onset of low grade fever, nausea and vomiting. Assess for splenic abscess.  EXAM: CTA ABDOMEN AND PELVIS wITHOUT AND WITH CONTRAST  TECHNIQUE: Multidetector CT imaging of  the abdomen and pelvis was performed using the standard protocol during bolus administration of intravenous contrast. Multiplanar reconstructed images and MIPs were obtained and reviewed to evaluate the vascular anatomy.  CONTRAST:  120mL OMNIPAQUE IOHEXOL 350 MG/ML SOLN  COMPARISON:  CTA of the abdomen and pelvis from 01/22/2015  FINDINGS: The abdominal aorta is unremarkable in appearance. There is no evidence of aortic dissection or aneurysmal dilatation. There is no evidence of calcific atherosclerotic disease. The celiac trunk, superior mesenteric artery, bilateral renal arteries and inferior mesenteric artery are unremarkable in appearance. Extensive coils are again noted at the splenic hilum, at the prior site of the patient's splenic artery aneurysm.  The inferior vena cava is unremarkable in appearance. The portal venous system is unremarkable in appearance.  The visualized lung bases are clear.  The region of decreased attenuation within the spleen is stable or very slightly decreased in size, measuring approximately 8.5 x 7.3 x 4.7 cm. Mild surrounding soft tissue inflammation is perhaps slightly improved. This is favored to reflect an evolving splenic infarct. Septated fluid collection or abscess is considered less likely. No abnormal peripheral enhancement is seen.  The liver is unremarkable in appearance. The patient is status post cholecystectomy, with clips noted along the gallbladder fossa. The pancreas and adrenal glands are unremarkable.  A nonobstructing 8 mm stone is noted at the lower pole of the left kidney. A smaller 6 mm stone is noted at the interpole region of the left kidney. The kidneys are otherwise unremarkable in appearance. No hydronephrosis is seen. No obstructing ureteral stones are identified. No perinephric stranding is appreciated.  No free fluid is identified. The small bowel is unremarkable in appearance. The stomach is within normal limits. No acute vascular abnormalities are  seen.  The appendix is normal in caliber, without evidence of appendicitis. The colon is unremarkable in appearance.  The bladder is mildly distended and grossly unremarkable. The patient is status post hysterectomy. No suspicious adnexal masses are seen. The ovaries are relatively symmetric in appearance. No inguinal lymphadenopathy is seen.  No acute osseous abnormalities are identified. 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  Review of the MIP images confirms the above findings.  IMPRESSION: 1. Region of decreased attenuation within the spleen is stable or very slightly decreased in size from the prior CT, measuring 8.5 x 7.3 x 4.7 cm. Mild surrounding soft tissue inflammation is perhaps slightly improved. This is favored to reflect an evolving splenic infarct. A septated fluid collection or abscess is considered less likely. No abnormal peripheral enhancement seen. 2. Extensive coils again noted at the splenic hilum. No acute vascular abnormality seen. 3. Nonobstructing left renal stones seen, measuring up to 8 mm in size 4. 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   Electronically Signed   By: Garald Balding M.D.   On: 01/31/2015 19:21    Assessment/Plan:    LOS: 2 days  s/p   Patient likely has pain from spleen following knee laceration yesterday. Spoke with Dr. Zigmund Daniel who  reviewed previous CT and feels that patient likely has splenic abscess. We'll repeat CTA of chest and abdomen today to be certain there has been no change since procedure yesterday We'll hold off on discharge home today and check lab in a.m.   Tinnie Gens, MD 02/02/2015 12:17 PM

## 2015-02-02 NOTE — Progress Notes (Signed)
Pt 02 Sat 88 on room air. Pt had removed oxygen. Pt sitting on edge of bed not in any visible distress, denies pain and breathing normally.   Will continue to monitor.   Earlie Lou

## 2015-02-02 NOTE — Progress Notes (Signed)
    Subjective  -   Low-grade fevers overnight, no chills. Pain from aspiration has nearly resolved IV antibiotics started yesterday (Zosyn)  Physical Exam:  Nonlabored respirations Abdomen soft   Assessment/Plan:    The patient appears to be doing better today.  She did have low-grade fevers overnight, all less than 100.  She states that her chills did not occur last night.  I would like for her to get 1 more dose of Zosyn.  She is currently finishing up her second dose.  After that I have recommended transitioned to Augmentin 875 mg by mouth twice a day and discharged home later this afternoon.  She will contact me if she develops worsening fever or chills.  Otherwise I will see her back in the office in approximately 2 weeks with a repeat CT scan of the abdomen and pelvis with IV contrast.  BRABHAM IV, V. WELLS 02/02/2015 8:13 AM --  Filed Vitals:   02/02/15 0522  BP: 125/74  Pulse: 87  Temp: 99.1 F (37.3 C)  Resp: 16    Intake/Output Summary (Last 24 hours) at 02/02/15 0813 Last data filed at 02/02/15 0527  Gross per 24 hour  Intake    120 ml  Output    700 ml  Net   -580 ml     Laboratory CBC    Component Value Date/Time   WBC 11.9* 01/31/2015 1725   WBC 6.3 06/11/2012 1012   HGB 14.0 01/31/2015 1725   HGB 15.6 06/11/2012 1012   HCT 41.1 01/31/2015 1725   HCT 44.0 06/11/2012 1012   PLT 132* 01/31/2015 1725   PLT 108* 06/11/2012 1012    BMET    Component Value Date/Time   NA 139 01/31/2015 1725   K 3.7 01/31/2015 1725   CL 103 01/31/2015 1725   CO2 26 01/31/2015 1725   GLUCOSE 143* 01/31/2015 1725   BUN 11 01/31/2015 1725   CREATININE 0.87 01/31/2015 1725   CREATININE 0.83 03/31/2014 1044   CALCIUM 9.3 01/31/2015 1725   GFRNONAA 78* 01/31/2015 1725   GFRAA 90* 01/31/2015 1725    COAG Lab Results  Component Value Date   INR 1.10 01/31/2015   No results found for: PTT  Antibiotics Anti-infectives    Start     Dose/Rate Route Frequency  Ordered Stop   02/02/15 0000  amoxicillin-clavulanate (AUGMENTIN) 875-125 MG per tablet     1 tablet Oral 2 times daily 02/02/15 0804     02/01/15 1800  piperacillin-tazobactam (ZOSYN) IVPB 3.375 g     3.375 g 12.5 mL/hr over 240 Minutes Intravenous 3 times per day 02/01/15 1724         V. Leia Alf, M.D. [facility] [place] Office: [phone] Pager:  [phone]

## 2015-02-02 NOTE — Progress Notes (Signed)
Patient ID: Peggy Thomas, female   DOB: Jul 03, 1966, 49 y.o.   MRN: [id number]   Pt is post splenic artery aneurysm coiling embolization with Dr Trula Slade on 12/26/14 Developed fevers; pain few days ago CT 3/23 revealed what appears to be spelenic abscess  Aspiration performed in IR 3/24 20 cc dark fluid obtained No drain placed   Pt now continues to complain of LUQ pain  Worsening this am RN says pt states-"it hurts so much I can hardly breathe" Afeb; HR 82 BP 125/74  CT Abd w/o cx ordered stat  Noted:  Dr Kellie Simmering has also ordered CTA Abd and Chest Will dc order for CT abd w/o cx

## 2015-02-03 ENCOUNTER — Inpatient Hospital Stay (HOSPITAL_COMMUNITY): Payer: Managed Care, Other (non HMO)

## 2015-02-03 DIAGNOSIS — J9811 Atelectasis: Secondary | ICD-10-CM

## 2015-02-03 DIAGNOSIS — J90 Pleural effusion, not elsewhere classified: Secondary | ICD-10-CM

## 2015-02-03 LAB — BASIC METABOLIC PANEL
Anion gap: 6 (ref 5–15)
BUN: 5 mg/dL — ABNORMAL LOW (ref 6–23)
CALCIUM: 8.8 mg/dL (ref 8.4–10.5)
CHLORIDE: 100 mmol/L (ref 96–112)
CO2: 29 mmol/L (ref 19–32)
CREATININE: 0.91 mg/dL (ref 0.50–1.10)
GFR calc Af Amer: 85 mL/min — ABNORMAL LOW (ref >90)
GFR calc non Af Amer: 73 mL/min — ABNORMAL LOW (ref >90)
GLUCOSE: 112 mg/dL — AB (ref 70–99)
Potassium: 4.1 mmol/L (ref 3.5–5.1)
Sodium: 135 mmol/L (ref 135–145)

## 2015-02-03 LAB — CBC
HEMATOCRIT: 34.8 % — AB (ref 36.0–46.0)
Hemoglobin: 11.5 g/dL — ABNORMAL LOW (ref 12.0–15.0)
MCH: 30.3 pg (ref 26.0–34.0)
MCHC: 33 g/dL (ref 30.0–36.0)
MCV: 91.6 fL (ref 78.0–100.0)
PLATELETS: 177 10*3/uL (ref 150–400)
RBC: 3.8 MIL/uL — AB (ref 3.87–5.11)
RDW: 13.2 % (ref 11.5–15.5)
WBC: 11.6 10*3/uL — ABNORMAL HIGH (ref 4.0–10.5)

## 2015-02-03 MED ORDER — ALBUTEROL SULFATE (2.5 MG/3ML) 0.083% IN NEBU
2.5000 mg | INHALATION_SOLUTION | Freq: Four times a day (QID) | RESPIRATORY_TRACT | Status: DC
  Administered 2015-02-03 – 2015-02-04 (×3): 2.5 mg via RESPIRATORY_TRACT
  Filled 2015-02-03 (×4): qty 3

## 2015-02-03 NOTE — Consult Note (Signed)
Dictation #: [id number]

## 2015-02-03 NOTE — Consult Note (Signed)
Peggy, [name]:  [id number]  MEDICAL RECORD NO.:  [id number]  LOCATION:  [id number]                        FACILITY:  [facility]  PHYSICIAN:  Kathee Delton, MD,FCCPDATE OF BIRTH:  31-Oct[birth year]1966  DATE OF CONSULTATION:  02/03/2015 DATE OF DISCHARGE:                                CONSULTATION   REFERRING PHYSICIAN:  Dr. Kellie Simmering.  HISTORY OF PRESENT ILLNESS:  The patient is a 49 year old female who I have been asked to see for a worsening pleural effusions and atelectasis.  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  She was recently admitted with the onset of fever and worsening left upper quadrant pain, and on imaging, there was a question of a splenic abscess.  The patient underwent needle aspiration with negative cultures, and was not felt to be an infection.  However, she has had worsening left upper quadrant and left-sided chest discomfort and was found to have progressive opacity in her left lower lung fields on followup x-rays.  The patient had no pleural effusion on January 22, 2015 by CT abdomen, but had a moderate effusion with significant left lower lobe atelectasis on a scan on February 02, 2015.  She has subsequently had a followup chest x-ray today that shows increasing density in her left base.  It is unclear how much of this is atelectasis versus actual pleural effusion.  Although the patient did have fevers at home, she has had no cough, no chest congestion, and does not feel that she has a chest infection.  She has had some mild increased shortness of breath related to splinting and her inability to take a deep breath.  The patient does have a long history of smoking a pack a day for 30 years, and continues to do so.  PAST MEDICAL HISTORY: 1. Significant for depression. 2. Hypothyroidism. 3. History of renal calculi. 4. History of chronic idiopathic thrombocytopenia. 5. Status post  hysterectomy.  ALLERGIES:  The patient has no known drug allergies.  SOCIAL HISTORY:  The patient is married and continues to smoke a pack a day.  She drinks alcohol only socially.  FAMILY HISTORY:  Remarkable for diabetes, asthma, CVA, and Alzheimer's disease.  REVIEW OF SYSTEMS:  A 10-point review of systems is unremarkable except for that listed in the history of present illness.  PHYSICAL EXAMINATION:  GENERAL:  She is an obese female, in no acute distress. VITAL SIGNS:  Blood pressure is 129/71, pulse is 90, respiratory rate 17.  She is afebrile.  O2 saturation on room air is 92%-93%. HEENT:  Pupils equal, round, reactive to light and accommodation. Extraocular muscles are intact.  Nares patent without discharge. Oropharynx is clear. NECK:  Supple without jugular venous distention, or lymphadenopathy. There is no palpable thyromegaly. CHEST:  Reveals significantly decreased breath sounds on the left about halfway up, with definite E to A changes noted.  There is no wheezing; and the right lung is clear. CARDIAC:  With regular rate and rhythm. ABDOMEN:  Soft.  Not significantly tender even in the left upper quadrant.  Bowel sounds are present. GENITAL:  Not done and not indicated. RECTAL:  Not  done and not indicated. BREASTS:  Not done and not indicated. EXTREMITIES:  Lower extremities showed no significant edema, no cyanosis. NEUROLOGICAL:  She is alert and oriented and moves all 4 extremities.  IMPRESSION:  Left pleural effusion with associated atelectasis.  It is unclear how much of this is simply related to her pain with splinting, which then leads to atelectasis, which then leads to a pleural effusion. The effusion could also be associated with her recent subdiaphragmatic procedure.  She has no history to suggest a chest infection or a pulmonary embolus.  Her most recent chest x-ray does show significantly increased density in the left chest, but it is unclear how  much of this is atelectasis versus effusion.  I think it is worthwhile to at least ultrasound her left pleural space and to proceed with thoracentesis with the appropriate studies if enough fluid is present.  Would also work on aggressive pulmonary toilet with bronchodilators, getting out of bed, and using the incentive spirometer aggressively.  Finally, I have also encouraged her to work on total smoking cessation once she is discharged.     Kathee Delton, MD,FCCP     [facility]  D:  02/03/2015  T:  02/03/2015  Job:  [id number]  cc:   Dr. Kellie Simmering

## 2015-02-03 NOTE — Progress Notes (Addendum)
  Progress Note    02/03/2015 8:47 AM   Subjective:  Pain is much better today  Tm 99.9 now 99 HR 80's-100's NSR 428'J-681'L systolic 57% RA  Filed Vitals:   02/03/15 0433  BP:   Pulse: 80  Temp: 99 F (37.2 C)  Resp:     Physical Exam: Cardiac:  regular Lungs:  Decreased BS left mid and base of lung.  Right lung clear Abdomen:  Soft, NT to palpation, ND  CBC    Component Value Date/Time   WBC 11.6* 02/03/2015 0408   WBC 6.3 06/11/2012 1012   RBC 3.80* 02/03/2015 0408   RBC 4.88 06/11/2012 1012   RBC 4.54 09/18/2011 0919   HGB 11.5* 02/03/2015 0408   HGB 15.6 06/11/2012 1012   HCT 34.8* 02/03/2015 0408   HCT 44.0 06/11/2012 1012   PLT 177 02/03/2015 0408   PLT 108* 06/11/2012 1012   MCV 91.6 02/03/2015 0408   MCV 90 06/11/2012 1012   MCH 30.3 02/03/2015 0408   MCH 32.0 06/11/2012 1012   MCHC 33.0 02/03/2015 0408   MCHC 35.5 06/11/2012 1012   RDW 13.2 02/03/2015 0408   RDW 12.5 06/11/2012 1012   LYMPHSABS 2.0 12/29/2014 1641   LYMPHSABS 2.3 06/11/2012 1012   MONOABS 1.1* 12/29/2014 1641   EOSABS 0.0 12/29/2014 1641   EOSABS 0.1 06/11/2012 1012   BASOSABS 0.0 12/29/2014 1641   BASOSABS 0.0 06/11/2012 1012    BMET    Component Value Date/Time   NA 135 02/03/2015 0408   K 4.1 02/03/2015 0408   CL 100 02/03/2015 0408   CO2 29 02/03/2015 0408   GLUCOSE 112* 02/03/2015 0408   BUN 5* 02/03/2015 0408   CREATININE 0.91 02/03/2015 0408   CREATININE 0.83 03/31/2014 1044   CALCIUM 8.8 02/03/2015 0408   GFRNONAA 73* 02/03/2015 0408   GFRAA 85* 02/03/2015 0408    INR    Component Value Date/Time   INR 1.10 01/31/2015 1725     Intake/Output Summary (Last 24 hours) at 02/03/15 0847 Last data filed at 02/02/15 1817  Gross per 24 hour  Intake    200 ml  Output    800 ml  Net   -600 ml     Assessment:  49 y.o. female is s/p:  1. Ultrasound-guided access, right femoral artery 2. Abdominal aortogram 3. Coil embolization, splenic artery 4.  Third order catheterization 12/26/14   Plan: -pt's pain much improved this am -RN note from earlier states O2 sats on RA was 88%, but no distress.  Will check PA/Lat CXR this am to evaluate left pleural effusion. -will order Incentive spirometer -DVT prophylaxis:  None-pt had procedure in IR -continue ABx -possibly home later today   Leontine Locket, PA-C [facility] [phone] 02/03/2015 8:47 AM  Patient states pain is much better today Repeat chest x-ray reveals persistent left pleural effusion with significant atelectasis which appears worse Patient is splinting with her deep breathing because of the pain from the puncture site over the spleen  Have requested pulmonary consult with Dr. Lanny Hurst clance to see if thoracentesis and other measures might be of benefit Can DC home when pulmonary status stable

## 2015-02-04 ENCOUNTER — Inpatient Hospital Stay (HOSPITAL_COMMUNITY): Payer: Managed Care, Other (non HMO)

## 2015-02-04 LAB — BODY FLUID CELL COUNT WITH DIFFERENTIAL
EOS FL: 2 %
LYMPHS FL: 0 %
MONOCYTE-MACROPHAGE-SEROUS FLUID: 6 % — AB (ref 50–90)
Neutrophil Count, Fluid: 92 % — ABNORMAL HIGH (ref 0–25)
WBC FLUID: 3100 uL — AB (ref 0–1000)

## 2015-02-04 LAB — PROTEIN, BODY FLUID: TOTAL PROTEIN, FLUID: 4.3 g/dL

## 2015-02-04 LAB — CULTURE, ROUTINE-ABSCESS

## 2015-02-04 LAB — LACTATE DEHYDROGENASE, PLEURAL OR PERITONEAL FLUID: LD FL: 1822 U/L — AB (ref 3–23)

## 2015-02-04 LAB — GLUCOSE, SEROUS FLUID

## 2015-02-04 LAB — AMYLASE, PLEURAL FLUID: AMYLASE, PLEURAL FLUID: 11 U/L

## 2015-02-04 MED ORDER — ALBUTEROL SULFATE (2.5 MG/3ML) 0.083% IN NEBU
2.5000 mg | INHALATION_SOLUTION | RESPIRATORY_TRACT | Status: DC | PRN
  Administered 2015-02-06: 2.5 mg via RESPIRATORY_TRACT
  Filled 2015-02-04: qty 3

## 2015-02-04 MED ORDER — VANCOMYCIN HCL IN DEXTROSE 1-5 GM/200ML-% IV SOLN
1000.0000 mg | Freq: Two times a day (BID) | INTRAVENOUS | Status: DC
  Administered 2015-02-05: 1000 mg via INTRAVENOUS
  Filled 2015-02-04 (×2): qty 200

## 2015-02-04 MED ORDER — LEVOTHYROXINE SODIUM 75 MCG PO TABS
75.0000 ug | ORAL_TABLET | Freq: Every day | ORAL | Status: DC
  Administered 2015-02-04 – 2015-02-11 (×8): 75 ug via ORAL
  Filled 2015-02-04 (×11): qty 1

## 2015-02-04 MED ORDER — CIPROFLOXACIN HCL 500 MG PO TABS
500.0000 mg | ORAL_TABLET | Freq: Two times a day (BID) | ORAL | Status: DC
  Administered 2015-02-04: 500 mg via ORAL
  Filled 2015-02-04 (×3): qty 1

## 2015-02-04 MED ORDER — LIDOCAINE HCL (PF) 1 % IJ SOLN
INTRAMUSCULAR | Status: AC
  Filled 2015-02-04: qty 10

## 2015-02-04 MED ORDER — VANCOMYCIN HCL 10 G IV SOLR
1500.0000 mg | Freq: Once | INTRAVENOUS | Status: AC
  Administered 2015-02-04: 1500 mg via INTRAVENOUS
  Filled 2015-02-04: qty 1500

## 2015-02-04 MED ORDER — PIPERACILLIN-TAZOBACTAM 3.375 G IVPB
3.3750 g | Freq: Three times a day (TID) | INTRAVENOUS | Status: DC
  Administered 2015-02-04 – 2015-02-08 (×13): 3.375 g via INTRAVENOUS
  Filled 2015-02-04 (×16): qty 50

## 2015-02-04 NOTE — Progress Notes (Signed)
Subjective: Pt had thoracentesis today without complication.  Has had low grade fever the last 24 hrs.  Denies increased sob, and not overly congested.  Objective: Vital signs in last 24 hours: Blood pressure 104/70, pulse 108, temperature 100.1 F (37.8 C), temperature source Oral, resp. rate 20, height 5\' 2"  (1.575 m), weight 86.592 kg (190 lb 14.4 oz), last menstrual period 08/10/2010, SpO2 93 %.  Intake/Output from previous day: 03/26 0701 - 03/27 0700 In: 960 [P.O.:960] Out: 700 [Urine:700]   Physical Exam:   ow female in nad Nose without purulence or d/c noted. Neck without LN or TMG Chest with very diminished bs on left, +e to a change Cor with rrr abd soft, bs+, nontender LE without significant edema, no cyanosis Alert and oriented, moves all 4    Lab Results:  Recent Labs  02/03/15 0408  WBC 11.6*  HGB 11.5*  HCT 34.8*  PLT 177   BMET  Recent Labs  02/03/15 0408  NA 135  K 4.1  CL 100  CO2 29  GLUCOSE 112*  BUN 5*  CREATININE 0.91  CALCIUM 8.8    Studies/Results: Dg Chest 1 View  02/04/2015   CLINICAL DATA:  Status post thoracentesis  EXAM: CHEST  1 VIEW  COMPARISON:  February 03, 2015  FINDINGS: There is no demonstrable pneumothorax. There is extensive consolidation throughout the left mid and lower lung zones with air bronchogram effect. There may be some residual effusion on the left. The right lung is clear. Heart is enlarged with pulmonary vascularity grossly within normal limits. No adenopathy.  IMPRESSION: No apparent pneumothorax. Extensive consolidation with possible loculated effusion on the left. Right lung clear. No change in cardiac silhouette.   Electronically Signed   By: Lowella Grip III M.D.   On: 02/04/2015 10:24   Dg Chest 2 View  02/03/2015   CLINICAL DATA:  Follow-up left-sided pleural effusion. Subsequent encounter.  EXAM: CHEST  2 VIEW  COMPARISON:  CTA of the chest performed 02/02/2015, and chest radiograph performed  12/29/2014  FINDINGS: The lungs are hypoexpanded. There appears to be an increasing moderate left-sided pleural effusion, with underlying airspace opacification. No pneumothorax is seen.  The cardiomediastinal silhouette is borderline normal in size, though difficult to fully assess due to the left-sided pleural effusion. No pneumothorax is seen. Coils are noted at the left upper quadrant, reflecting recent splenic artery aneurysm coiling. Clips are noted within the right upper quadrant, reflecting prior cholecystectomy. No acute osseous abnormalities are seen.  IMPRESSION: Lungs hypoexpanded. Increasing moderate left-sided pleural effusion, with underlying airspace opacification. Would perform follow-up chest radiograph once the patient's symptoms resolve, to ensure resolution of pleural effusion and associated airspace opacity.   Electronically Signed   By: Garald Balding M.D.   On: 02/03/2015 10:14   Ct Angio Abdomen W/cm &/or Wo Contrast  02/02/2015   CLINICAL DATA:  Severe left-sided chest pain and posterior left upper quadrant abdominal pain. The patient had a splenic biopsy yesterday.  EXAM: CT ANGIOGRAPHY ABDOMEN; CT ANGIOGRAPHY CHEST  TECHNIQUE: Multidetector CT imaging of the abdomen was performed using the standard protocol during bolus administration of intravenous contrast. Multiplanar reconstructed images including MIPs were obtained and reviewed to evaluate the vascular anatomy.  CONTRAST:  126mL OMNIPAQUE IOHEXOL 350 MG/ML SOLN  COMPARISON:  CT scans dated 02/01/2015 and 01/31/2015  FINDINGS: CTA of the chest: The patient has developed a small to moderate left pleural effusion with secondary moderate atelectasis in the lingula and left lower lobe. Heart  size is normal. Minimal atelectasis medially in the right upper lobe. No hilar or mediastinal adenopathy. No pneumothorax. No osseous abnormality.  CTA of the abdomen: The patient has undergone splenic artery coronal embolization. 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 20 cc was aspirated from this area yesterday. It appears essentially unchanged. There is no adjacent ascites. Overall size of the spleen is stable.  The adjacent pancreas, bowel, and osseous structures are normal.  The liver and adrenal glands and kidneys are normal. Abdominal aorta is normal.  Review of the MIP images confirms the above findings.  IMPRESSION: 1. New small to moderate left pleural effusion with partial atelectasis of the lingula and left lower lobe. I think this is a sympathetic effusion with atelectasis secondary to splinting from the pain associated with the splenic infarct. 2. No change in the appearance of the splenic infarct or of the rest of the upper abdomen since 01/31/2015. No evidence of infection.   Electronically Signed   By: Lorriane Shire M.D.   On: 02/02/2015 15:23   Ct Angio Chest Aorta W/cm &/or Wo/cm  02/02/2015   CLINICAL DATA:  Severe left-sided chest pain and posterior left upper quadrant abdominal pain. The patient had a splenic biopsy yesterday.  EXAM: CT ANGIOGRAPHY ABDOMEN; CT ANGIOGRAPHY CHEST  TECHNIQUE: Multidetector CT imaging of the abdomen was performed using the standard protocol during bolus administration of intravenous contrast. Multiplanar reconstructed images including MIPs were obtained and reviewed to evaluate the vascular anatomy.  CONTRAST:  136mL OMNIPAQUE IOHEXOL 350 MG/ML SOLN  COMPARISON:  CT scans dated 02/01/2015 and 01/31/2015  FINDINGS: CTA of the chest: The patient has developed a small to moderate left pleural effusion with secondary moderate atelectasis in the lingula and left lower lobe. Heart size is normal. Minimal atelectasis medially in the right upper lobe. No hilar or mediastinal adenopathy. No pneumothorax. No osseous abnormality.  CTA of the abdomen: The patient has undergone splenic artery coronal embolization. 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 20 cc was aspirated from this area yesterday.  It appears essentially unchanged. There is no adjacent ascites. Overall size of the spleen is stable.  The adjacent pancreas, bowel, and osseous structures are normal.  The liver and adrenal glands and kidneys are normal. Abdominal aorta is normal.  Review of the MIP images confirms the above findings.  IMPRESSION: 1. New small to moderate left pleural effusion with partial atelectasis of the lingula and left lower lobe. I think this is a sympathetic effusion with atelectasis secondary to splinting from the pain associated with the splenic infarct. 2. No change in the appearance of the splenic infarct or of the rest of the upper abdomen since 01/31/2015. No evidence of infection.   Electronically Signed   By: Lorriane Shire M.D.   On: 02/02/2015 15:23   US Thoracentesis Asp Pleural Space W/img Guide  02/04/2015   INDICATION: Symptomatic L sided pleural effusion  EXAM: US THORACENTESIS ASP PLEURAL SPACE W/IMG GUIDE  COMPARISON:  None.  MEDICATIONS: 10 cc 1% lidocaine  COMPLICATIONS: None immediate  TECHNIQUE: Informed written consent was obtained from the patient after a discussion of the risks, benefits and alternatives to treatment. A timeout was performed prior to the initiation of the procedure.  Initial ultrasound scanning demonstrates a left pleural effusion. The lower chest was prepped and draped in the usual sterile fashion. 1% lidocaine was used for local anesthesia.  Under direct ultrasound guidance, a 19 gauge, 7-cm, Yueh catheter was introduced. An ultrasound  image was saved for documentation purposes. the thoracentesis was performed. The catheter was removed and a dressing was applied. The patient tolerated the procedure well without immediate post procedural complication. The patient was escorted to have an upright chest radiograph.  FINDINGS: A total of approximately 100 cc of cloudy yellow fluid was removed. Requested samples were sent to the laboratory.  IMPRESSION: Successful ultrasound-guided L  sided thoracentesis yielding 100 cc of pleural fluid.  Pocket small and loculated.  Withdrew what I was able to.  Read by:  Lavonia Drafts [facility]   Electronically Signed   By: Corrie Mckusick D.O.   On: 02/04/2015 11:37    Assessment/Plan:  1) Small left pleural fluid by U/S, but labs c/w complicated parapneumonic effusion.  High LDH, low glucose, high neutrophil count all suggestive of empyema.  Cultures on fluid pending.    -start broad spectrum abx for possible HCAP -f/u cultures on pleural fluid -the pocket of fluid was small by u/s, and therefore I do not feel there is an immediate need for chest tube.  However, I do think it would be prudent to do a followup chest ct tomorrow to make sure there is not loculated fluid that needs to be drained more aggressively.  2) Severe left lung atx that is secondary to probable HCAP given the above?  This is being complicated by splinting and hypoaeration related to the pt's pain.  -continue nebs, IS, OOB -pain control    Kathee Delton, M.D. 02/04/2015, 12:44 PM

## 2015-02-04 NOTE — Procedures (Signed)
  US guided L thora  100 cc cloudy yellow fluid Tolerated well  BP stable  cxr pending

## 2015-02-04 NOTE — Progress Notes (Signed)
Patient ID: Peggy Thomas, female   DOB: 27-Jun-1966, 49 y.o.   MRN: [id number] Vascular Surgery Progress Note  Subjective: Patient seen by Dr. Gwenette Greet yesterday who recommended thoracentesis left pleural effusion-that is scheduled for this morning in radiology Patient did complain of some chills last p.m. States her back and abdomen feel better   Objective:  Filed Vitals:   02/04/15 0558  BP: 107/82  Pulse: 108  Temp: 100.1 F (37.8 C)  Resp: 20    Gen. alert and oriented 3 Still slight splinting the left side No evidence of infection at puncture site left posterior lateral chest  Culture of splenic material which was aspirated by our has no growth at 24 hours   Labs:  Recent Labs Lab 01/31/15 1725 02/03/15 0408  CREATININE 0.87 0.91    Recent Labs Lab 01/31/15 1725 02/03/15 0408  NA 139 135  K 3.7 4.1  CL 103 100  CO2 26 29  BUN 11 5*  CREATININE 0.87 0.91  GLUCOSE 143* 112*  CALCIUM 9.3 8.8    Recent Labs Lab 01/31/15 1725 02/03/15 0408  WBC 11.9* 11.6*  HGB 14.0 11.5*  HCT 41.1 34.8*  PLT 132* 177    Recent Labs Lab 01/31/15 1725  INR 1.10    I/O last 3 completed shifts: In: 33 [P.O.:960] Out: 700 [Urine:700]  Imaging: Dg Chest 2 View  02/03/2015   CLINICAL DATA:  Follow-up left-sided pleural effusion. Subsequent encounter.  EXAM: CHEST  2 VIEW  COMPARISON:  CTA of the chest performed 02/02/2015, and chest radiograph performed 12/29/2014  FINDINGS: The lungs are hypoexpanded. There appears to be an increasing moderate left-sided pleural effusion, with underlying airspace opacification. No pneumothorax is seen.  The cardiomediastinal silhouette is borderline normal in size, though difficult to fully assess due to the left-sided pleural effusion. No pneumothorax is seen. Coils are noted at the left upper quadrant, reflecting recent splenic artery aneurysm coiling. Clips are noted within the right upper quadrant, reflecting prior  cholecystectomy. No acute osseous abnormalities are seen.  IMPRESSION: Lungs hypoexpanded. Increasing moderate left-sided pleural effusion, with underlying airspace opacification. Would perform follow-up chest radiograph once the patient's symptoms resolve, to ensure resolution of pleural effusion and associated airspace opacity.   Electronically Signed   By: Garald Balding M.D.   On: 02/03/2015 10:14   Ct Angio Abdomen W/cm &/or Wo Contrast  02/02/2015   CLINICAL DATA:  Severe left-sided chest pain and posterior left upper quadrant abdominal pain. The patient had a splenic biopsy yesterday.  EXAM: CT ANGIOGRAPHY ABDOMEN; CT ANGIOGRAPHY CHEST  TECHNIQUE: Multidetector CT imaging of the abdomen was performed using the standard protocol during bolus administration of intravenous contrast. Multiplanar reconstructed images including MIPs were obtained and reviewed to evaluate the vascular anatomy.  CONTRAST:  18mL OMNIPAQUE IOHEXOL 350 MG/ML SOLN  COMPARISON:  CT scans dated 02/01/2015 and 01/31/2015  FINDINGS: CTA of the chest: The patient has developed a small to moderate left pleural effusion with secondary moderate atelectasis in the lingula and left lower lobe. Heart size is normal. Minimal atelectasis medially in the right upper lobe. No hilar or mediastinal adenopathy. No pneumothorax. No osseous abnormality.  CTA of the abdomen: The patient has undergone splenic artery coronal embolization. 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 20 cc was aspirated from this area yesterday. It appears essentially unchanged. There is no adjacent ascites. Overall size of the spleen is stable.  The adjacent pancreas, bowel, and osseous structures are normal.  The liver and  adrenal glands and kidneys are normal. Abdominal aorta is normal.  Review of the MIP images confirms the above findings.  IMPRESSION: 1. New small to moderate left pleural effusion with partial atelectasis of the lingula and left lower lobe. I think  this is a sympathetic effusion with atelectasis secondary to splinting from the pain associated with the splenic infarct. 2. No change in the appearance of the splenic infarct or of the rest of the upper abdomen since 01/31/2015. No evidence of infection.   Electronically Signed   By: Lorriane Shire M.D.   On: 02/02/2015 15:23   Ct Angio Chest Aorta W/cm &/or Wo/cm  02/02/2015   CLINICAL DATA:  Severe left-sided chest pain and posterior left upper quadrant abdominal pain. The patient had a splenic biopsy yesterday.  EXAM: CT ANGIOGRAPHY ABDOMEN; CT ANGIOGRAPHY CHEST  TECHNIQUE: Multidetector CT imaging of the abdomen was performed using the standard protocol during bolus administration of intravenous contrast. Multiplanar reconstructed images including MIPs were obtained and reviewed to evaluate the vascular anatomy.  CONTRAST:  167mL OMNIPAQUE IOHEXOL 350 MG/ML SOLN  COMPARISON:  CT scans dated 02/01/2015 and 01/31/2015  FINDINGS: CTA of the chest: The patient has developed a small to moderate left pleural effusion with secondary moderate atelectasis in the lingula and left lower lobe. Heart size is normal. Minimal atelectasis medially in the right upper lobe. No hilar or mediastinal adenopathy. No pneumothorax. No osseous abnormality.  CTA of the abdomen: The patient has undergone splenic artery coronal embolization. 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 20 cc was aspirated from this area yesterday. It appears essentially unchanged. There is no adjacent ascites. Overall size of the spleen is stable.  The adjacent pancreas, bowel, and osseous structures are normal.  The liver and adrenal glands and kidneys are normal. Abdominal aorta is normal.  Review of the MIP images confirms the above findings.  IMPRESSION: 1. New small to moderate left pleural effusion with partial atelectasis of the lingula and left lower lobe. I think this is a sympathetic effusion with atelectasis secondary to splinting from the  pain associated with the splenic infarct. 2. No change in the appearance of the splenic infarct or of the rest of the upper abdomen since 01/31/2015. No evidence of infection.   Electronically Signed   By: Lorriane Shire M.D.   On: 02/02/2015 15:23    Assessment/Plan:  POD #4   LOS: 4 days  s/p   For thoracentesis today with culture of fluid. Suspect this is sympathetic effusion but could be blood-unlikely Patient will continue with pulmonary toilet post thoracentesis and repeat chest x-ray in a.m. Will start on Cipro for low-grade urinary tract infection   Tinnie Gens, MD 02/04/2015 8:33 AM

## 2015-02-04 NOTE — Progress Notes (Signed)
ANTIBIOTIC CONSULT NOTE - INITIAL  Pharmacy Consult for vancomycin, zosyn Indication: pneumonia  No Known Allergies  Patient Measurements: Height: 5\' 2"  (157.5 cm) Weight: 190 lb 14.4 oz (86.592 kg) IBW/kg (Calculated) : 50.1 Adjusted Body Weight:   Vital Signs: Temp: 98.2 F (36.8 C) (03/27 1410) Temp Source: Oral (03/27 1410) BP: 129/68 mmHg (03/27 1410) Pulse Rate: 97 (03/27 1410) Intake/Output from previous day: 03/26 0701 - 03/27 0700 In: 960 [P.O.:960] Out: 700 [Urine:700] Intake/Output from this shift: Total I/O In: 600 [P.O.:600] Out: 400 [Urine:400]  Labs:  Recent Labs  02/03/15 0408  WBC 11.6*  HGB 11.5*  PLT 177  CREATININE 0.91   Medical History: Past Medical History  Diagnosis Date  . Hypocalcemia   . Depression 04/30/2011  . Hypothyroidism 04/30/2011  . History of kidney stones   . Right ureteral stone   . Chronic idiopathic thrombocytopenia     MILD--   HEMATOLOGIST--  DR Marin Olp  . PONV (postoperative nausea and vomiting)   . Aneurysm     on spleen-watching     Medications:  Anti-infectives    Start     Dose/Rate Route Frequency Ordered Stop   02/05/15 0200  vancomycin (VANCOCIN) IVPB 1000 mg/200 mL premix     1,000 mg 200 mL/hr over 60 Minutes Intravenous Every 12 hours 02/04/15 1254     02/04/15 1400  piperacillin-tazobactam (ZOSYN) IVPB 3.375 g     3.375 g 12.5 mL/hr over 240 Minutes Intravenous 3 times per day 02/04/15 1254     02/04/15 1400  vancomycin (VANCOCIN) 1,500 mg in sodium chloride 0.9 % 500 mL IVPB     1,500 mg 250 mL/hr over 120 Minutes Intravenous  Once 02/04/15 1254     02/04/15 1000  ciprofloxacin (CIPRO) tablet 500 mg  Status:  Discontinued     500 mg Oral 2 times daily 02/04/15 0836 02/04/15 1251   02/02/15 0000  amoxicillin-clavulanate (AUGMENTIN) 875-125 MG per tablet     1 tablet Oral 2 times daily 02/02/15 0804     02/01/15 1800  piperacillin-tazobactam (ZOSYN) IVPB 3.375 g  Status:  Discontinued     3.375  g 12.5 mL/hr over 240 Minutes Intravenous 3 times per day 02/01/15 1724 02/04/15 1238     Assessment: Pt was initially started on abx for possible intra-abdominal infection s/p coiling embolization of splenic aneurysm.  Pt then had pleural fluid seen on ultrasound, pt underwent thoracentesis and now will continue on abx for pneumonia/empyema.  Today is zosyn day #4, cultures from pleural fluid unlikely to have significant growth.    3/37 pleural fluid - IP   3/23 blood x 2 - ngtd 3/24 splenic abscess - few corynebacterium 3/24 abscess: fungal - ngtd 3/24 abscess: anaerobic - ngtd   3/24 fungal cx: ngtd  Goal of Therapy:  Vancomycin trough level 15-20 mcg/ml  Plan:  -Zosyn 3.375 g IV q8h -Vancomycin 1500 mg IV x1 then 1g/12h -Monitor renal fx, cultures, duration of therapy, [place] at [place], PharmD, BCPS Clinical Pharmacist Pager: [phone] 02/04/2015 3:08 PM

## 2015-02-05 ENCOUNTER — Other Ambulatory Visit: Payer: Self-pay | Admitting: *Deleted

## 2015-02-05 ENCOUNTER — Inpatient Hospital Stay (HOSPITAL_COMMUNITY): Payer: Managed Care, Other (non HMO)

## 2015-02-05 DIAGNOSIS — D733 Abscess of spleen: Principal | ICD-10-CM

## 2015-02-05 DIAGNOSIS — Z48812 Encounter for surgical aftercare following surgery on the circulatory system: Secondary | ICD-10-CM

## 2015-02-05 DIAGNOSIS — R0600 Dyspnea, unspecified: Secondary | ICD-10-CM

## 2015-02-05 DIAGNOSIS — I728 Aneurysm of other specified arteries: Secondary | ICD-10-CM

## 2015-02-05 DIAGNOSIS — J869 Pyothorax without fistula: Secondary | ICD-10-CM

## 2015-02-05 DIAGNOSIS — J90 Pleural effusion, not elsewhere classified: Secondary | ICD-10-CM | POA: Insufficient documentation

## 2015-02-05 DIAGNOSIS — J948 Other specified pleural conditions: Secondary | ICD-10-CM

## 2015-02-05 LAB — BASIC METABOLIC PANEL
Anion gap: 4 — ABNORMAL LOW (ref 5–15)
BUN: <5 mg/dL — ABNORMAL LOW (ref 6–23)
CHLORIDE: 95 mmol/L — AB (ref 96–112)
CO2: 31 mmol/L (ref 19–32)
CREATININE: 1.1 mg/dL (ref 0.50–1.10)
Calcium: 8.5 mg/dL (ref 8.4–10.5)
GFR calc Af Amer: 68 mL/min — ABNORMAL LOW (ref >90)
GFR calc non Af Amer: 58 mL/min — ABNORMAL LOW (ref >90)
Glucose, Bld: 157 mg/dL — ABNORMAL HIGH (ref 70–99)
Potassium: 3.8 mmol/L (ref 3.5–5.1)
SODIUM: 130 mmol/L — AB (ref 135–145)

## 2015-02-05 LAB — CBC
HCT: 34.8 % — ABNORMAL LOW (ref 36.0–46.0)
HEMOGLOBIN: 11.4 g/dL — AB (ref 12.0–15.0)
MCH: 29.9 pg (ref 26.0–34.0)
MCHC: 32.8 g/dL (ref 30.0–36.0)
MCV: 91.3 fL (ref 78.0–100.0)
PLATELETS: 220 10*3/uL (ref 150–400)
RBC: 3.81 MIL/uL — AB (ref 3.87–5.11)
RDW: 13.7 % (ref 11.5–15.5)
WBC: 21.5 10*3/uL — ABNORMAL HIGH (ref 4.0–10.5)

## 2015-02-05 LAB — TYPE AND SCREEN
ABO/RH(D): A POS
ANTIBODY SCREEN: NEGATIVE

## 2015-02-05 LAB — ABO/RH: ABO/RH(D): A POS

## 2015-02-05 MED ORDER — FLUCONAZOLE 200 MG PO TABS
200.0000 mg | ORAL_TABLET | Freq: Every day | ORAL | Status: DC
Start: 2015-02-06 — End: 2015-02-11
  Administered 2015-02-06 – 2015-02-11 (×6): 200 mg via ORAL
  Filled 2015-02-05 (×6): qty 1

## 2015-02-05 MED ORDER — FLUCONAZOLE 200 MG PO TABS
400.0000 mg | ORAL_TABLET | Freq: Every day | ORAL | Status: AC
  Administered 2015-02-05: 400 mg via ORAL
  Filled 2015-02-05: qty 2

## 2015-02-05 NOTE — Progress Notes (Signed)
Subjective: Feels worse this AM from admission and over the weekend. Had thoracentesis 3/27 (exudative with concern for empyema - cultures pending).  She can tell that "chest has filled back up".  Having slight discomfort from thoracentesis sight, but mainly complaining of dyspnea despite supplemental O2.  Objective: Vital signs in last 24 hours: Blood pressure 125/72, pulse 99, temperature 98.8 F (37.1 C), temperature source Oral, resp. rate 20, height 5\' 2"  (1.575 m), weight 86.592 kg (190 lb 14.4 oz), last menstrual period 08/10/2010, SpO2 95 %.  Intake/Output from previous day: 03/27 0701 - 03/28 0700 In: 600 [P.O.:600] Out: 1200 [Urine:1200]  Thoracentesis Labs 3/27 >>> WBC 3100 (92% neuts), LDH 1822.  Cultures pending   Physical Exam:  Pleasant adult female in nad Neck without LN or TMG Chest with very diminished bs on left, + egophany on left with dullness to percussion throughout left. Cor with rrr abd soft, bs+, nontender LE without significant edema, no cyanosis Alert and oriented, moves all 4    Lab Results:  Recent Labs  02/03/15 0408  WBC 11.6*  HGB 11.5*  HCT 34.8*  PLT 177   BMET  Recent Labs  02/03/15 0408  NA 135  K 4.1  CL 100  CO2 29  GLUCOSE 112*  BUN 5*  CREATININE 0.91  CALCIUM 8.8    Studies/Results: Dg Chest 1 View  02/04/2015   CLINICAL DATA:  Status post thoracentesis  EXAM: CHEST  1 VIEW  COMPARISON:  February 03, 2015  FINDINGS: There is no demonstrable pneumothorax. There is extensive consolidation throughout the left mid and lower lung zones with air bronchogram effect. There may be some residual effusion on the left. The right lung is clear. Heart is enlarged with pulmonary vascularity grossly within normal limits. No adenopathy.  IMPRESSION: No apparent pneumothorax. Extensive consolidation with possible loculated effusion on the left. Right lung clear. No change in cardiac silhouette.   Electronically Signed   By: Lowella Grip  III M.D.   On: 02/04/2015 10:24   Dg Chest 2 View  02/05/2015   CLINICAL DATA:  Left-sided pleural effusion, shortness of Breath  EXAM: CHEST  2 VIEW  COMPARISON:  02/04/2015  FINDINGS: Cardiac shadow is stable. A large left-sided complicated pleural effusion is again seen there is likely some underlying consolidation as well. The right lung remains clear. 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 No other focal abnormality is noted.  IMPRESSION: Stable changes in the left hemi thorax consistent with a complicated pleural effusion and likely underlying consolidation. The overall appearance has worsened slightly in the interval from the prior exam.   Electronically Signed   By: Inez Catalina M.D.   On: 02/05/2015 07:47   US Thoracentesis Asp Pleural Space W/img Guide  02/04/2015   INDICATION: Symptomatic L sided pleural effusion  EXAM: US THORACENTESIS ASP PLEURAL SPACE W/IMG GUIDE  COMPARISON:  None.  MEDICATIONS: 10 cc 1% lidocaine  COMPLICATIONS: None immediate  TECHNIQUE: Informed written consent was obtained from the patient after a discussion of the risks, benefits and alternatives to treatment. A timeout was performed prior to the initiation of the procedure.  Initial ultrasound scanning demonstrates a left pleural effusion. The lower chest was prepped and draped in the usual sterile fashion. 1% lidocaine was used for local anesthesia.  Under direct ultrasound guidance, a 19 gauge, 7-cm, Yueh catheter was introduced. An ultrasound image was saved for documentation purposes. the thoracentesis was performed. The catheter was removed and a dressing was  applied. The patient tolerated the procedure well without immediate post procedural complication. The patient was escorted to have an upright chest radiograph.  FINDINGS: A total of approximately 100 cc of cloudy yellow fluid was removed. Requested samples were sent to the laboratory.  IMPRESSION: Successful ultrasound-guided L sided thoracentesis  yielding 100 cc of pleural fluid.  Pocket small and loculated.  Withdrew what I was able to.  Read by:  Lavonia Drafts [facility]   Electronically Signed   By: Corrie Mckusick D.O.   On: 02/04/2015 11:37    Assessment/Plan:  Acute hypoxic respiratory failure - Continue supplemental O2 as needed to maintain SpO2 > 92%.  Small left pleural fluid by U/S - s/p thoracentesis 3/27 with labs c/w complicated parapneumonic effusion.  High LDH, low glucose, high neutrophil count all suggestive of empyema.  Cultures on fluid pending.  CXR 3/28 worsened. - CT chest ordered for this AM to evaluate for loculations / empyema that may require chest tube - will follow up after scan completed. - Vanc / Zosyn started 3/27. - f/u cultures on pleural fluid.  Severe left lung atx that is secondary to probable HCAP given the above.  Question whether this is being complicated by splinting and hypoaeration related to the pt's pain. - Continue nebs, IS, OOB. - Pain control.   Montey Hora, [place] [facility] Pager: [phone]  or [phone] 02/05/2015, 10:55 AM   Attending Note:  I have examined patient, reviewed labs, studies and notes. I have discussed the case with Junius Roads, and I agree with the data and plans as amended above. Await CT scan chest to assess pleural space and LLL. May need Tube thoracostomy vs TCTS evaluation.   Baltazar Apo, MD, PhD 02/05/2015, 2:09 PM [place] [facility] [phone] or if no answer [phone]

## 2015-02-05 NOTE — Consult Note (Signed)
[facility]    Date of Admission:  01/31/2015           Day 5 piperacillin tazobactam         Day 2 vancomycin       Reason for Consult: Splenic abscess and left pleural empyema    Referring Physician: Dr. Annamarie Major  Principal Problem:   Splenic abscess Active Problems:   Pleural empyema   . levothyroxine  75 mcg Oral QAC breakfast  . ondansetron (ZOFRAN) IV  4 mg Intravenous Once  . pantoprazole  40 mg Oral Daily  . piperacillin-tazobactam (ZOSYN)  IV  3.375 g Intravenous 3 times per day  . potassium chloride  20-40 mEq Oral Once  . vancomycin  1,000 mg Intravenous Q12H    Recommendations: 1. Continue piperacillin tazobactam 2. Start fluconazole 3. Discontinue vancomycin 4. Await results of repeat chest CT scan in pleural fluid cultures   Assessment: She has a polymicrobial splenic abscess. I will continue piperacillin tazobactam and add fluconazole. It is unclear if her left pleural empyema is a parapneumonic effusion or related to trans-diaphragmatic spread of infection.   HPI: Peggy Thomas is a 49 y.o. 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 She states that very shortly postoperatively she developed left upper quadrant pain and low-grade fevers. More recently the pain and fevers have been increasing and she underwent a follow-up CT scan which showed a splenic abscess and left pleural effusion. The splenic abscess was aspirated. Abscess Gram stain showed gram-positive rods and yeast. Cultures grew only diphtheroids. Yesterday she underwent left thoracentesis revealing an exudative effusion compatible with empyema. She is still febrile.  Review of Systems: Constitutional: positive for chills and fevers, negative for anorexia, sweats and weight loss Eyes: negative Ears, nose, mouth, throat, and face: negative Respiratory: positive for pleurisy/chest pain, negative for cough and  sputum Cardiovascular: negative Gastrointestinal: negative Genitourinary:negative  Past Medical History  Diagnosis Date  . Hypocalcemia   . Depression 04/30/2011  . Hypothyroidism 04/30/2011  . History of kidney stones   . Right ureteral stone   . Chronic idiopathic thrombocytopenia     MILD--   HEMATOLOGIST--  DR Marin Olp  . PONV (postoperative nausea and vomiting)   . 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 Smoking status: Current Every Day Smoker -- 1.00 packs/day for 30 years    Types: Cigarettes  . Smokeless tobacco: Never Used  . Alcohol Use: Yes     Comment: occasionally/socially    Family History  Problem Relation Age of Onset  . Diabetes Mother     type 2  . Asthma Mother   . Cancer Mother     skin cancer  . Cancer Father     bone  . Mental illness Sister     schizophrenia  . Alcohol abuse Maternal Grandmother   . Stroke Maternal Grandmother   . Aneurysm Maternal Grandmother     brain aneurysm  . Other Maternal Grandfather     black lung  . Alzheimer's disease Paternal Grandmother   . Stroke Maternal Aunt    No Known Allergies  OBJECTIVE: Blood pressure 125/72, pulse 99, temperature 98.8 F (37.1 C), temperature source Oral, resp. rate 20, height 5\' 2"  (1.575 m), weight 190 lb 14.4 oz (86.592 kg), last menstrual period 08/10/2010, SpO2 95 %. General: She appears uncomfortable due to  pain. She is sitting up on the side of her bed.  Skin: No rash  Lungs: Decreased breath sounds on left with egophony  Cor: Tachycardic but regular S1 and S2 with no murmurs  Abdomen: Soft with mild left-sided tenderness   Lab Results Lab Results  Component Value Date   WBC 11.6* 02/03/2015   HGB 11.5* 02/03/2015   HCT 34.8* 02/03/2015   MCV 91.6 02/03/2015   PLT 177 02/03/2015    Lab Results  Component Value Date   CREATININE 0.91 02/03/2015   BUN 5* 02/03/2015   NA 135 02/03/2015   K 4.1 02/03/2015   CL 100 02/03/2015   CO2 29  02/03/2015    Lab Results  Component Value Date   ALT 72* 01/31/2015   AST 42* 01/31/2015   ALKPHOS 105 01/31/2015   BILITOT 0.6 01/31/2015     Microbiology: Recent Results (from the past 240 hour(s))  Culture, blood (routine x 2)     Status: None (Preliminary result)   Collection Time: 01/31/15  5:25 PM  Result Value Ref Range Status   Specimen Description BLOOD LEFT HAND  Final   Special Requests BOTTLES DRAWN AEROBIC ONLY 8CC  Final   Culture   Final           BLOOD CULTURE RECEIVED NO GROWTH TO DATE CULTURE WILL BE HELD FOR 5 DAYS BEFORE ISSUING A FINAL NEGATIVE REPORT Performed at [facility]    Report Status PENDING  Incomplete  Culture, blood (routine x 2)     Status: None (Preliminary result)   Collection Time: 01/31/15  5:30 PM  Result Value Ref Range Status   Specimen Description BLOOD RIGHT HAND  Final   Special Requests BOTTLES DRAWN AEROBIC ONLY 3CC  Final   Culture   Final           BLOOD CULTURE RECEIVED NO GROWTH TO DATE CULTURE WILL BE HELD FOR 5 DAYS BEFORE ISSUING A FINAL NEGATIVE REPORT Performed at [facility]    Report Status PENDING  Incomplete  Culture, routine-abscess     Status: None   Collection Time: 02/01/15 10:55 AM  Result Value Ref Range Status   Specimen Description ABSCESS SPLEEN  Final   Special Requests NONE  Final   Gram Stain   Final    FEW WBC PRESENT,BOTH PMN AND MONONUCLEAR NO SQUAMOUS EPITHELIAL CELLS SEEN MODERATE GRAM POSITIVE RODS RARE YEAST Performed at [facility]    Culture   Final    FEW DIPHTHEROIDS(CORYNEBACTERIUM SPECIES) Note: Standardized susceptibility testing for this organism is not available. Performed at [facility]    Report Status 02/04/2015 FINAL  Final  Anaerobic culture     Status: None (Preliminary result)   Collection Time: 02/01/15 10:55 AM  Result Value Ref Range Status   Specimen Description ABSCESS SPLEEN  Final   Special Requests NONE  Final   Gram Stain    Final    FEW WBC PRESENT,BOTH PMN AND MONONUCLEAR NO SQUAMOUS EPITHELIAL CELLS SEEN MODERATE GRAM POSITIVE RODS RARE YEAST Performed at [facility]    Culture   Final    NO ANAEROBES ISOLATED; CULTURE IN PROGRESS FOR 5 DAYS Performed at [facility]    Report Status PENDING  Incomplete  Fungus culture w smear     Status: None (Preliminary result)   Collection Time: 02/01/15 10:55 AM  Result Value Ref Range Status   Specimen Description ABSCESS SPLEEN  Final   Special Requests  NONE  Final   Fungal Smear   Final    NO YEAST OR FUNGAL ELEMENTS SEEN Performed at [facility]    Culture   Final    CULTURE IN PROGRESS FOR FOUR WEEKS Performed at [facility]    Report Status PENDING  Incomplete  Body fluid culture     Status: None (Preliminary result)   Collection Time: 02/04/15 10:20 AM  Result Value Ref Range Status   Specimen Description PLEURAL LEFT FLUID  Final   Special Requests NONE  Final   Gram Stain   Final    FEW WBC PRESENT, PREDOMINANTLY PMN NO ORGANISMS SEEN Performed at [facility]    Culture PENDING  Incomplete   Report Status PENDING  Incomplete    Michel Bickers, MD [place] [facility] [phone] pager   [phone] cell 02/05/2015, 11:16 AM

## 2015-02-05 NOTE — Consult Note (Signed)
[address]       [place] [address]             [phone]        Preeti D Bleier [place] Medical Record #[id number] Date of Birth: 09/19/1966  Referring: Dr Lamonte Sakai Primary Care: Penni Homans, MD    Chief Complaint: Large Loculated Pleural Effusion    History of Present Illness:   Ms. Brosious a 49 yo white female S/P Splenic artery embolization done on 12/26/2014. She initially did well however at her 1 month follow up visit, the patient admitted to not feeling well. She had been suffering from low grade fevers with episodes of nausea, vomiting, and overall feeling of being ill. CT scan was obtained and showed a large fluid collection in the midportion of the spleen. It was felt she would require admission for further care. She was placed on Zosyn and IR consult was placed for possible drainage. This was performed and 20 cc of dark fluid was removed. The patient developed severe abdominal pain post procedure and CT of the Abdomen was obtained. This did not show evidence of significant abscess or acute bleeding from drainage site. Follow up CXR was obtained and showed a large left sided pleural effusion. Pulmonary consult was requested who recommended thoracentesis be performed. This was performed by IR and 100 cc of cloudy yellow fluid was removed. Pulmonology continued to follow the patient and recommended repeat CT scan to look for parapneumonic effusion vs. Empyema. This was done and confirmed the presence of a large left sided loculated pleural effusion. Cultures from splenic drainage showed yeast and diphtheroids resulting in ID consult.  Currently the patient complains of pain and shortness of breath.    Current Activity/ Functional Status: Patient is independent with mobility/ambulation, transfers, ADL's, IADL's.   Zubrod Score: At the time of surgery this patient's most appropriate activity status/level should be described  as: []     0    Normal activity, no symptoms [x]     1    Restricted in physical strenuous activity but ambulatory, able to do out light work []     2    Ambulatory and capable of self care, unable to do work activities, up and about                 more than 50%  Of the time                            []     3    Only limited self care, in bed greater than 50% of waking hours []     4    Completely disabled, no self care, confined to bed or chair []     5    Moribund  Past Medical History  Diagnosis Date  . Hypocalcemia   . Depression 04/30/2011  . Hypothyroidism 04/30/2011  . History of kidney stones   . Right ureteral stone   . Chronic idiopathic thrombocytopenia     MILD--   HEMATOLOGIST--  DR Marin Olp  . PONV (postoperative nausea and vomiting)   . 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 Cholecystectomy  2000  . Cesarean section  1996 and 1998  . Tubal ligation  1999  . Knee arthroscopy    . Robotic assisted total hysterectomy  10-15-2010    LAPAROSCOPIC /  LYSIS ADHESIONS  . Extracorporeal shock wave lithotripsy  2003  . Cystoscopy with retrograde pyelogram, ureteroscopy and stent placement Right 07/25/2013    Procedure: CYSTOSCOPY WITH RETROGRADE PYELOGRAM, URETEROSCOPY, STONE EXTRACTION AND POSSIBLE STENT PLACEMENT;  Surgeon: Ailene Rud, MD;  Location: [facility];  Service: Urology;  Laterality: Right;  . Abdominal hysterectomy    . Embolization N/A 12/26/2014    Procedure: EMBOLIZATION;  Surgeon: Serafina Mitchell, MD;  Location: [facility] CATH LAB;  Service: Cardiovascular;  Laterality: N/A;  . Peripheral vascular catheterization  12/26/2014    Procedure: VISCERAL ANGIOGRAPHY;  Surgeon: Serafina Mitchell, MD;  Location: [facility] CATH LAB;  Service: Cardiovascular;;    History  Smoking status  . Current Every Day Smoker -- 1.00 packs/day for 30 years  . Types: Cigarettes  Smokeless tobacco  . Never Used    History   Alcohol Use  . Yes    Comment: occasionally/socially    History   Social History  . Marital Status: Married    Spouse Name: N/A  . Number of Children: N/A  . Years of Education: N/A   Occupational History  . Not on file.   Social History Main Topics  . Smoking status: Current Every Day Smoker -- 1.00 packs/day for 30 years    Types: Cigarettes  . Smokeless tobacco: Never Used  . Alcohol Use: Yes     Comment: occasionally/socially  . Drug Use: No  . Sexual Activity:    Partners: Male    Birth Control/ Protection: Surgical     Comment: TLH/LOA   Other Topics Concern  . Not on file   Social History Narrative    No Known Allergies  Current Facility-Administered Medications  Medication Dose Route Frequency Provider Last Rate Last Dose  . acetaminophen (TYLENOL) tablet 325-650 mg  325-650 mg Oral Q6H PRN Hulen Shouts Rhyne, PA-C   650 mg at 02/04/15 2058  . albuterol (PROVENTIL) (2.5 MG/3ML) 0.083% nebulizer solution 2.5 mg  2.5 mg Nebulization Q4H PRN Serafina Mitchell, MD      . alum & mag hydroxide-simeth (MAALOX/MYLANTA) 200-200-20 MG/5ML suspension 15-30 mL  15-30 mL Oral Q2H PRN Samantha J Rhyne, PA-C      . bisacodyl (DULCOLAX) suppository 10 mg  10 mg Rectal Daily PRN Gabriel Earing, PA-C      . [START ON 02/06/2015] fluconazole (DIFLUCAN) tablet 200 mg  200 mg Oral Daily Michel Bickers, MD      . guaiFENesin-dextromethorphan (ROBITUSSIN DM) 100-10 MG/5ML syrup 15 mL  15 mL Oral Q4H PRN Samantha J Rhyne, PA-C      . hydrALAZINE (APRESOLINE) injection 5 mg  5 mg Intravenous Q20 Min PRN Samantha J Rhyne, PA-C      . HYDROmorphone (DILAUDID) injection 0.5-1 mg  0.5-1 mg Intravenous Q2H PRN Ulyses Amor, PA-C   1 mg at 02/02/15 1558  . labetalol (NORMODYNE,TRANDATE) injection 10 mg  10 mg Intravenous Q10 min PRN Samantha J Rhyne, PA-C      . levothyroxine (SYNTHROID, LEVOTHROID) tablet 75 mcg  75 mcg Oral QAC breakfast Mal Misty, MD   75 mcg at 02/05/15 [phone]  .  metoprolol (LOPRESSOR) injection 2-5 mg  2-5 mg Intravenous Q2H PRN Samantha J Rhyne, PA-C      . ondansetron (ZOFRAN) injection 4 mg  4 mg Intravenous Q6H PRN Hulen Shouts Rhyne, PA-C   4 mg at 02/04/15 1048  . ondansetron (ZOFRAN) injection 4 mg  4 mg Intravenous Once Levan Hurst D  Lilia Pro, PA-C      . oxyCODONE (Oxy IR/ROXICODONE) immediate release tablet 5-10 mg  5-10 mg Oral Q4H PRN Hulen Shouts Rhyne, PA-C   10 mg at 02/05/15 1544  . pantoprazole (PROTONIX) EC tablet 40 mg  40 mg Oral Daily Samantha J Rhyne, PA-C   40 mg at 02/05/15 0756  . phenol (CHLORASEPTIC) mouth spray 1 spray  1 spray Mouth/Throat PRN Samantha J Rhyne, PA-C      . piperacillin-tazobactam (ZOSYN) IVPB 3.375 g  3.375 g Intravenous 3 times per day Jake Church Masters, RPH   3.375 g at 02/05/15 1544  . potassium chloride SA (K-DUR,KLOR-CON) CR tablet 20-40 mEq  20-40 mEq Oral Once [employer], PA-C      . senna-docusate (Senokot-S) tablet 1 tablet  1 tablet Oral QHS PRN Gabriel Earing, PA-C   1 tablet at 02/04/15 2103    Prescriptions prior to admission  Medication Sig Dispense Refill Last Dose  . acetaminophen (TYLENOL) 500 MG tablet Take 1,000 mg by mouth every 6 (six) hours as needed for moderate pain.   Past Month at Unknown time  . b complex vitamins tablet Take 1 tablet by mouth as needed (for energy).    01/31/2015 at Unknown time  . furosemide (LASIX) 20 MG tablet Take 1 tablet (20 mg total) by mouth daily. (Patient taking differently: Take 20 mg by mouth daily as needed for fluid. ) 30 tablet 3 Past Month at Unknown time  . ibuprofen (ADVIL,MOTRIN) 600 MG tablet Take 1 tablet (600 mg total) by mouth every 8 (eight) hours as needed for headache, mild pain or moderate pain. 40 tablet 2 Past Month at Unknown time  . levothyroxine (SYNTHROID, LEVOTHROID) 75 MCG tablet TAKE 1 TABLET (75 MCG TOTAL) BY MOUTH DAILY BEFORE BREAKFAST. 30 tablet 2 01/31/2015 at Unknown time  . HYDROcodone-acetaminophen (NORCO/VICODIN) 5-325 MG per  tablet Take 1-2 tablets by mouth every 6 (six) hours as needed for moderate pain or severe pain. (Patient not taking: Reported on 01/31/2015) 20 tablet 0 Not Taking at Unknown time  . ondansetron (ZOFRAN) 4 MG tablet Take 1 tablet (4 mg total) by mouth every 6 (six) hours. (Patient not taking: Reported on 01/29/2015) 12 tablet 0 Not Taking at Unknown time    Family History  Problem Relation Age of Onset  . Diabetes Mother     type 2  . Asthma Mother   . Cancer Mother     skin cancer  . Cancer Father     bone  . Mental illness Sister     schizophrenia  . Alcohol abuse Maternal Grandmother   . Stroke Maternal Grandmother   . Aneurysm Maternal Grandmother     brain aneurysm  . Other Maternal Grandfather     black lung  . Alzheimer's disease Paternal Grandmother   . Stroke Maternal Aunt      Review of Systems:      Cardiac Revinew of Systems: Y or N  Chest Pain [    ]  Resting SOB [  n ] Exertional SOB  [ y ]  Orthopnea [  ]   Pedal Edema Blue.Reese   ]    Palpitations [ n ] Syncope  [ n ]   Presyncope [n   ]  General Review of Systems: [Y] = yes [  ]=no Constitional: recent weight change [  ]; anorexia [  y]; fatigue [  ]y; nausea [  y]; night sweats [ y ]; fever [  y ]; or chills [ y ]                                                               Dental: poor dentition[  ]; Last Dentist visit:   Eye : blurred vision [  ]; diplopia [   ]; vision changes [  ];  Amaurosis fugax[  ]; Resp: cough [  ];  wheezing[  ];  hemoptysis[  ]; shortness of breath[  ]; paroxysmal nocturnal dyspnea[  ]; dyspnea on exertion[  ]; or orthopnea[  ];  GI:  gallstones[  ], vomiting[ n ];  dysphagia[  ]; melena[  ];  hematochezia [  ]; heartburn[  ];   Hx of  Colonoscopy[n  ]; GU: kidney stones [ y]; hematuria[  ];   dysuria [  ];  nocturia[  ];  history of     obstruction [  ]; urinary frequency [n  ]             Skin: rash, swelling[  ];, hair loss[  ];  peripheral edema[  ];  or itching[  ]; Musculosketetal:  myalgias[  ];  joint swelling[  ];  joint erythema[  ];  joint pain[  ];  back pain[ y ];  Heme/Lymph: bruising[  ];  bleeding[  ];  anemia[  ];  Neuro: TIA[  ];  headaches[  ];  stroke[  ];  vertigo[  ];  seizuresn ];   paresthesias[  ];  difficulty walking[  ];  Psych:depression[  ]; anxiety[  ];  Endocrine: diabetes[  ];  thyroid dysfunction[  ];  Immunizations: Flu [  ]; Pneumococcal[  ];  Other:  Physical Exam: BP 125/72 mmHg  Pulse 99  Temp(Src) 98.8 F (37.1 C) (Oral)  Resp 20  Ht 5\' 2"  (1.575 m)  Wt 190 lb 14.4 oz (86.592 kg)  BMI 34.91 kg/m2  SpO2 95%  LMP 08/10/2010   General appearance: alert, cooperative, appears older than stated age, mild distress and pale Head: Normocephalic, without obvious abnormality, atraumatic Neck: no adenopathy, no carotid bruit, no JVD, supple, symmetrical, trachea midline and thyroid not enlarged, symmetric, no tenderness/mass/nodules Lymph nodes: Cervical, supraclavicular, and axillary nodes normal. Resp: diminished breath sounds LLL and LUL Back: symmetric, no curvature. ROM normal. No CVA tenderness. Cardio: regular rate and rhythm, S1, S2 normal, no murmur, click, rub or gallop GI: soft, non-tender; bowel sounds normal; no masses,  no organomegaly Extremities: extremities normal, atraumatic, no cyanosis or edema and Homans sign is negative, no sign of DVT Neurologic: Grossly normal  Diagnostic Studies & Laboratory data:     Recent Radiology Findings:   Dg Chest 1 View  02/04/2015   CLINICAL DATA:  Status post thoracentesis  EXAM: CHEST  1 VIEW  COMPARISON:  February 03, 2015  FINDINGS: There is no demonstrable pneumothorax. There is extensive consolidation throughout the left mid and lower lung zones with air bronchogram effect. There may be some residual effusion on the left. The right lung is clear. Heart is enlarged with pulmonary vascularity grossly within normal limits. No adenopathy.  IMPRESSION: No apparent pneumothorax. Extensive  consolidation with possible loculated effusion on the left. Right lung clear. No change in cardiac silhouette.   Electronically Signed   By: Gwyndolyn Saxon  Jasmine December III M.D.   On: 02/04/2015 10:24   Dg Chest 2 View  02/05/2015   CLINICAL DATA:  Left-sided pleural effusion, shortness of Breath  EXAM: CHEST  2 VIEW  COMPARISON:  02/04/2015  FINDINGS: Cardiac shadow is stable. A large left-sided complicated pleural effusion is again seen there is likely some underlying consolidation as well. The right lung remains clear. 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 No other focal abnormality is noted.  IMPRESSION: Stable changes in the left hemi thorax consistent with a complicated pleural effusion and likely underlying consolidation. The overall appearance has worsened slightly in the interval from the prior exam.   Electronically Signed   By: Inez Catalina M.D.   On: 02/05/2015 07:47   Ct Chest Wo Contrast  02/05/2015   CLINICAL DATA:  Dyspnea and persistent fevers. Left pleural effusion/ empyema evaluation.  EXAM: CT CHEST WITHOUT CONTRAST  TECHNIQUE: Multidetector CT imaging of the chest was performed following the standard protocol without IV contrast.  COMPARISON:  Chest radiographs earlier today.  Chest CT 02/02/2015.  FINDINGS: Small prevascular lymph nodes measure up to 10 mm in short axis, slightly larger than on the prior study and most likely reactive. No enlarged hilar lymph nodes are identified, however evaluation is limited by lack of IV contrast, particularly on the left. There is mild rightward mediastinal shift. The heart is normal in size. There is no pericardial effusion.  Subsegmental atelectasis is again seen in the medial right upper lobe and right middle lobe. There is a large left pleural effusion, increased in size from the prior CT. The effusion appears mildly loculated at the apex and anteriorly. There is compressive atelectasis resulting in near complete collapse of the left lower  lobe, complete collapse of lingula, and partial collapse of the left upper lobe with a small amount of aerated lung at the apex.  The visualized portion of the upper abdomen demonstrate sequelae of prior cholecystectomy and splenic artery embolization. 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 Nonobstructing left renal calculi are noted. No acute osseous abnormality is identified.  IMPRESSION: Large, partially loculated left pleural effusion with left lung compressive atelectasis.   Electronically Signed   By: Logan Bores   On: 02/05/2015 15:33   US Thoracentesis Asp Pleural Space W/img Guide  02/04/2015   INDICATION: Symptomatic L sided pleural effusion  EXAM: US THORACENTESIS ASP PLEURAL SPACE W/IMG GUIDE  COMPARISON:  None.  MEDICATIONS: 10 cc 1% lidocaine  COMPLICATIONS: None immediate  TECHNIQUE: Informed written consent was obtained from the patient after a discussion of the risks, benefits and alternatives to treatment. A timeout was performed prior to the initiation of the procedure.  Initial ultrasound scanning demonstrates a left pleural effusion. The lower chest was prepped and draped in the usual sterile fashion. 1% lidocaine was used for local anesthesia.  Under direct ultrasound guidance, a 19 gauge, 7-cm, Yueh catheter was introduced. An ultrasound image was saved for documentation purposes. the thoracentesis was performed. The catheter was removed and a dressing was applied. The patient tolerated the procedure well without immediate post procedural complication. The patient was escorted to have an upright chest radiograph.  FINDINGS: A total of approximately 100 cc of cloudy yellow fluid was removed. Requested samples were sent to the laboratory.  IMPRESSION: Successful ultrasound-guided L sided thoracentesis yielding 100 cc of pleural fluid.  Pocket small and loculated.  Withdrew what I was able to.  Read  by:  Lavonia Drafts [facility]   Electronically Signed    By: Corrie Mckusick D.O.   On: 02/04/2015 11:37     I have independently reviewed the above radiologic studies.  Recent Lab Findings: Lab Results  Component Value Date   WBC 11.6* 02/03/2015   HGB 11.5* 02/03/2015   HCT 34.8* 02/03/2015   PLT 177 02/03/2015   GLUCOSE 112* 02/03/2015   CHOL 134 03/31/2014   TRIG 85 03/31/2014   HDL 39* 03/31/2014   LDLCALC 78 03/31/2014   ALT 72* 01/31/2015   AST 42* 01/31/2015   NA 135 02/03/2015   K 4.1 02/03/2015   CL 100 02/03/2015   CREATININE 0.91 02/03/2015   BUN 5* 02/03/2015   CO2 29 02/03/2015   TSH 3.840 08/23/2014   INR 1.10 01/31/2015      Assessment / Plan:    1. Large Left Pleural effusion- Parapneumonic vs. Empyema:s/p thoracentesis 3/27 with labs c/w complicated parapneumonic effusion. High LDH, low glucose, high neutrophil count all suggestive of empyema  2. Splenic Abscess- ID following for +yeast and diphtheroids  With failure of drainage with thoracentesis I have recommended proceeding with left VATS and drainage of left complex effusion. Plan for tomorrow.  The goals risks and alternatives of the planned surgical procedure Bronchoscopy left VATS drainage of empyema  have been discussed with the patient in detail. The risks of the procedure including death, infection, stroke, myocardial infarction, bleeding, blood transfusion have all been discussed specifically.  I have quoted Aram Candela a 2% of perioperative mortality and a complication rate as high as15%. The patient's questions have been answered.Peggy Thomas is willing  to proceed with the planned procedure.    I  spent 40 minutes counseling the patient face to face and 50% or more the  time was spent in counseling and coordination of care. The total time spent in the appointment was 60 minutes.    Grace Isaac MD      [address] [place] [address] Office [phone]   Beeper [phone]  02/05/2015 5:00  PM

## 2015-02-05 NOTE — Progress Notes (Signed)
Pt O2 sat on RA 86%.  Pt in no visible distress and denies SOB.  Pt states that "she doesn't feel like she's taking in a full breath of air."  Pt had removed oxygen.  Pt placed on 1L [place] O2 sat now 95%.  Will continue to monitor patient closely.  Claudette Stapler, RN

## 2015-02-05 NOTE — Progress Notes (Signed)
  [facility] Progress Note  02/05/2015 8:11 AM  Subjective:  Having some fevers. No chills. No abdominal pain. Pain from puncture sites improved.   Tmax 101.5 BP sys 100s-130s 02 95% 1L  Filed Vitals:   02/05/15 0349  BP: 125/72  Pulse: 99  Temp: 98.8 F (37.1 C)  Resp: 20    Physical Exam: Sitting on side of bed in NAD Markedly diminished breath sounds on left. Clear on right.  RRR Splenic and thoracentesis puncture sites clean  CBC    Component Value Date/Time   WBC 11.6* 02/03/2015 0408   WBC 6.3 06/11/2012 1012   RBC 3.80* 02/03/2015 0408   RBC 4.88 06/11/2012 1012   RBC 4.54 09/18/2011 0919   HGB 11.5* 02/03/2015 0408   HGB 15.6 06/11/2012 1012   HCT 34.8* 02/03/2015 0408   HCT 44.0 06/11/2012 1012   PLT 177 02/03/2015 0408   PLT 108* 06/11/2012 1012   MCV 91.6 02/03/2015 0408   MCV 90 06/11/2012 1012   MCH 30.3 02/03/2015 0408   MCH 32.0 06/11/2012 1012   MCHC 33.0 02/03/2015 0408   MCHC 35.5 06/11/2012 1012   RDW 13.2 02/03/2015 0408   RDW 12.5 06/11/2012 1012   LYMPHSABS 2.0 12/29/2014 1641   LYMPHSABS 2.3 06/11/2012 1012   MONOABS 1.1* 12/29/2014 1641   EOSABS 0.0 12/29/2014 1641   EOSABS 0.1 06/11/2012 1012   BASOSABS 0.0 12/29/2014 1641   BASOSABS 0.0 06/11/2012 1012    BMET    Component Value Date/Time   NA 135 02/03/2015 0408   K 4.1 02/03/2015 0408   CL 100 02/03/2015 0408   CO2 29 02/03/2015 0408   GLUCOSE 112* 02/03/2015 0408   BUN 5* 02/03/2015 0408   CREATININE 0.91 02/03/2015 0408   CREATININE 0.83 03/31/2014 1044   CALCIUM 8.8 02/03/2015 0408   GFRNONAA 73* 02/03/2015 0408   GFRAA 85* 02/03/2015 0408    INR    Component Value Date/Time   INR 1.10 01/31/2015 1725     Intake/Output Summary (Last 24 hours) at 02/05/15 0811 Last data filed at 02/05/15 0351  Gross per 24 hour  Intake    240 ml  Output   1200 ml  Net   -960 ml     Assessment:  49 y.o. female is s/p: splenic artery coil  embolization 12/26/14, worsening left pleural effusion and atelectasis, s/p thoracentesis 02/04/15  Plan: -S/p thoracentesis yesterday for large left pleural effusion. CXR today without improvement. Pulmonology following.  CT chest per pulmonology. May need chest tube. Pleural fluid culture pending.  -On Vanc/zosyn for HCAP and empyema. Splenic abscess culture with moderate gram positive rods, rare, yeast and few corynebacterium species. Consulted ID for optimization of antibiotics.  -Continue pulmonary toilet.   Virgina Jock, PA-C [facility] Office: [phone] Pager: [phone] 02/05/2015 8:11 AM     I agree with the above.  Chest x-ray looks like recurrence of pleural effusion with possible underlying pneumonia.  Appreciate pulmonary assistance.  Consideration for chest tube.  Splenic aspirate grew out yeast and diphtheroids.  We will ask infectious disease to assist with antibiotic regimen both in the hospital and at home  [facility]

## 2015-02-06 ENCOUNTER — Inpatient Hospital Stay (HOSPITAL_COMMUNITY): Payer: Managed Care, Other (non HMO)

## 2015-02-06 ENCOUNTER — Encounter (HOSPITAL_COMMUNITY)
Admission: AD | Disposition: A | Discharge status: Home / Self Care | Payer: Self-pay | Source: Ambulatory Visit | Attending: Surgery

## 2015-02-06 ENCOUNTER — Inpatient Hospital Stay (HOSPITAL_COMMUNITY): Payer: Managed Care, Other (non HMO) | Admitting: Certified Registered"

## 2015-02-06 DIAGNOSIS — J869 Pyothorax without fistula: Secondary | ICD-10-CM | POA: Diagnosis present

## 2015-02-06 HISTORY — PX: VIDEO BRONCHOSCOPY: SHX5072

## 2015-02-06 HISTORY — PX: VIDEO ASSISTED THORACOSCOPY (VATS)/EMPYEMA: SHX6172

## 2015-02-06 LAB — CBC
HCT: 33.1 % — ABNORMAL LOW (ref 36.0–46.0)
Hemoglobin: 10.8 g/dL — ABNORMAL LOW (ref 12.0–15.0)
MCH: 29.7 pg (ref 26.0–34.0)
MCHC: 32.6 g/dL (ref 30.0–36.0)
MCV: 90.9 fL (ref 78.0–100.0)
Platelets: 243 10*3/uL (ref 150–400)
RBC: 3.64 MIL/uL — ABNORMAL LOW (ref 3.87–5.11)
RDW: 13.8 % (ref 11.5–15.5)
WBC: 22.2 10*3/uL — ABNORMAL HIGH (ref 4.0–10.5)

## 2015-02-06 LAB — BLOOD GAS, ARTERIAL
Acid-Base Excess: 3.9 mmol/L — ABNORMAL HIGH (ref 0.0–2.0)
Bicarbonate: 27.9 mEq/L — ABNORMAL HIGH (ref 20.0–24.0)
Drawn by: 31101
FIO2: 0.21 %
O2 Saturation: 88.7 %
Patient temperature: 98.6
TCO2: 29.2 mmol/L (ref 0–100)
pCO2 arterial: 42 mmHg (ref 35.0–45.0)
pH, Arterial: 7.437 (ref 7.350–7.450)
pO2, Arterial: 58.1 mmHg — ABNORMAL LOW (ref 80.0–100.0)

## 2015-02-06 LAB — COMPREHENSIVE METABOLIC PANEL
ALT: 39 U/L — ABNORMAL HIGH (ref 0–35)
AST: 28 U/L (ref 0–37)
Albumin: 2.4 g/dL — ABNORMAL LOW (ref 3.5–5.2)
Alkaline Phosphatase: 72 U/L (ref 39–117)
Anion gap: 10 (ref 5–15)
BUN: 6 mg/dL (ref 6–23)
CO2: 27 mmol/L (ref 19–32)
Calcium: 8.8 mg/dL (ref 8.4–10.5)
Chloride: 94 mmol/L — ABNORMAL LOW (ref 96–112)
Creatinine, Ser: 1.17 mg/dL — ABNORMAL HIGH (ref 0.50–1.10)
GFR calc Af Amer: 63 mL/min — ABNORMAL LOW (ref >90)
GFR calc non Af Amer: 54 mL/min — ABNORMAL LOW (ref >90)
Glucose, Bld: 117 mg/dL — ABNORMAL HIGH (ref 70–99)
Potassium: 4.1 mmol/L (ref 3.5–5.1)
Sodium: 131 mmol/L — ABNORMAL LOW (ref 135–145)
Total Bilirubin: 1.1 mg/dL (ref 0.3–1.2)
Total Protein: 6.5 g/dL (ref 6.0–8.3)

## 2015-02-06 LAB — SURGICAL PCR SCREEN
MRSA, PCR: NEGATIVE
STAPHYLOCOCCUS AUREUS: POSITIVE — AB

## 2015-02-06 LAB — APTT: aPTT: 33 seconds (ref 24–37)

## 2015-02-06 LAB — URINALYSIS, ROUTINE W REFLEX MICROSCOPIC
Bilirubin Urine: NEGATIVE
Glucose, UA: NEGATIVE mg/dL
Ketones, ur: NEGATIVE mg/dL
Leukocytes, UA: NEGATIVE
Nitrite: NEGATIVE
Protein, ur: 30 mg/dL — AB
Specific Gravity, Urine: 1.017 (ref 1.005–1.030)
Urobilinogen, UA: 0.2 mg/dL (ref 0.0–1.0)
pH: 5.5 (ref 5.0–8.0)

## 2015-02-06 LAB — CULTURE, BLOOD (ROUTINE X 2)
Culture: NO GROWTH
Culture: NO GROWTH

## 2015-02-06 LAB — URINE MICROSCOPIC-ADD ON

## 2015-02-06 LAB — ANAEROBIC CULTURE

## 2015-02-06 LAB — PROTIME-INR
INR: 1.31 (ref 0.00–1.49)
Prothrombin Time: 16.4 seconds — ABNORMAL HIGH (ref 11.6–15.2)

## 2015-02-06 SURGERY — BRONCHOSCOPY, VIDEO-ASSISTED
Anesthesia: General | Site: Chest

## 2015-02-06 MED ORDER — OXYCODONE HCL 5 MG/5ML PO SOLN
ORAL | Status: AC
  Filled 2015-02-06: qty 10

## 2015-02-06 MED ORDER — NEOSTIGMINE METHYLSULFATE 10 MG/10ML IV SOLN
INTRAVENOUS | Status: DC | PRN
  Administered 2015-02-06: 4 mg via INTRAVENOUS

## 2015-02-06 MED ORDER — ONDANSETRON HCL 4 MG/2ML IJ SOLN
INTRAMUSCULAR | Status: AC
  Filled 2015-02-06: qty 2

## 2015-02-06 MED ORDER — NEOSTIGMINE METHYLSULFATE 10 MG/10ML IV SOLN
INTRAVENOUS | Status: AC
  Filled 2015-02-06: qty 1

## 2015-02-06 MED ORDER — LIDOCAINE HCL (CARDIAC) 20 MG/ML IV SOLN
INTRAVENOUS | Status: AC
  Filled 2015-02-06: qty 5

## 2015-02-06 MED ORDER — LACTATED RINGERS IV SOLN: Freq: Once | INTRAVENOUS | Status: DC

## 2015-02-06 MED ORDER — LIDOCAINE HCL (CARDIAC) 20 MG/ML IV SOLN
INTRAVENOUS | Status: DC | PRN
  Administered 2015-02-06: 80 mg via INTRAVENOUS

## 2015-02-06 MED ORDER — PROMETHAZINE HCL 25 MG/ML IJ SOLN
6.2500 mg | INTRAMUSCULAR | Status: DC | PRN
Start: 2015-02-06 — End: 2015-02-06

## 2015-02-06 MED ORDER — OXYCODONE HCL 5 MG PO TABS
5.0000 mg | ORAL_TABLET | ORAL | Status: DC | PRN
  Administered 2015-02-06: 10 mg via ORAL
  Administered 2015-02-06 – 2015-02-07 (×2): 5 mg via ORAL
  Administered 2015-02-08 (×3): 10 mg via ORAL
  Administered 2015-02-08: 5 mg via ORAL
  Administered 2015-02-09 (×3): 10 mg via ORAL
  Administered 2015-02-10 – 2015-02-11 (×4): 5 mg via ORAL
  Filled 2015-02-06 (×2): qty 1
  Filled 2015-02-06 (×4): qty 2
  Filled 2015-02-06: qty 1
  Filled 2015-02-06: qty 2
  Filled 2015-02-06 (×2): qty 1
  Filled 2015-02-06: qty 2
  Filled 2015-02-06: qty 1
  Filled 2015-02-06: qty 2

## 2015-02-06 MED ORDER — ROCURONIUM BROMIDE 100 MG/10ML IV SOLN
INTRAVENOUS | Status: DC | PRN
  Administered 2015-02-06: 10 mg via INTRAVENOUS
  Administered 2015-02-06: 50 mg via INTRAVENOUS
  Administered 2015-02-06: 10 mg via INTRAVENOUS

## 2015-02-06 MED ORDER — HYDROMORPHONE HCL 1 MG/ML IJ SOLN
0.2500 mg | INTRAMUSCULAR | Status: DC | PRN
  Administered 2015-02-06 (×2): 0.5 mg via INTRAVENOUS

## 2015-02-06 MED ORDER — ONDANSETRON HCL 4 MG/2ML IJ SOLN
INTRAMUSCULAR | Status: DC | PRN
  Administered 2015-02-06: 4 mg via INTRAVENOUS

## 2015-02-06 MED ORDER — ONDANSETRON HCL 4 MG/2ML IJ SOLN: 4.0000 mg | Freq: Four times a day (QID) | INTRAMUSCULAR | Status: DC | PRN

## 2015-02-06 MED ORDER — SENNOSIDES-DOCUSATE SODIUM 8.6-50 MG PO TABS
1.0000 | ORAL_TABLET | Freq: Every day | ORAL | Status: DC
  Administered 2015-02-06 – 2015-02-10 (×5): 1 via ORAL
  Filled 2015-02-06 (×6): qty 1

## 2015-02-06 MED ORDER — SODIUM CHLORIDE 0.9 % IJ SOLN: 9.0000 mL | INTRAMUSCULAR | Status: DC | PRN

## 2015-02-06 MED ORDER — DIPHENHYDRAMINE HCL 50 MG/ML IJ SOLN: 12.5000 mg | Freq: Four times a day (QID) | INTRAMUSCULAR | Status: DC | PRN

## 2015-02-06 MED ORDER — FENTANYL CITRATE 0.05 MG/ML IJ SOLN
INTRAMUSCULAR | Status: AC
  Filled 2015-02-06: qty 5

## 2015-02-06 MED ORDER — SUCCINYLCHOLINE CHLORIDE 20 MG/ML IJ SOLN
INTRAMUSCULAR | Status: DC | PRN
  Administered 2015-02-06: 120 mg via INTRAVENOUS

## 2015-02-06 MED ORDER — BISACODYL 5 MG PO TBEC
10.0000 mg | DELAYED_RELEASE_TABLET | Freq: Every day | ORAL | Status: DC
  Administered 2015-02-08 – 2015-02-09 (×2): 10 mg via ORAL
  Filled 2015-02-06 (×2): qty 2

## 2015-02-06 MED ORDER — MIDAZOLAM HCL 2 MG/2ML IJ SOLN
INTRAMUSCULAR | Status: AC
  Filled 2015-02-06: qty 2

## 2015-02-06 MED ORDER — ROCURONIUM BROMIDE 50 MG/5ML IV SOLN
INTRAVENOUS | Status: AC
  Filled 2015-02-06: qty 1

## 2015-02-06 MED ORDER — TRAMADOL HCL 50 MG PO TABS: 50.0000 mg | ORAL_TABLET | Freq: Four times a day (QID) | ORAL | Status: DC | PRN

## 2015-02-06 MED ORDER — MIDAZOLAM HCL 5 MG/5ML IJ SOLN
INTRAMUSCULAR | Status: DC | PRN
  Administered 2015-02-06 (×2): 1 mg via INTRAVENOUS

## 2015-02-06 MED ORDER — LACTATED RINGERS IV SOLN
INTRAVENOUS | Status: DC | PRN
  Administered 2015-02-06 (×2): via INTRAVENOUS

## 2015-02-06 MED ORDER — SCOPOLAMINE 1 MG/3DAYS TD PT72
MEDICATED_PATCH | TRANSDERMAL | Status: AC
  Administered 2015-02-06: 1 via TRANSDERMAL
  Filled 2015-02-06: qty 1

## 2015-02-06 MED ORDER — PROPOFOL 10 MG/ML IV BOLUS
INTRAVENOUS | Status: DC | PRN
  Administered 2015-02-06: 120 mg via INTRAVENOUS

## 2015-02-06 MED ORDER — ACETAMINOPHEN 160 MG/5ML PO SOLN
1000.0000 mg | Freq: Four times a day (QID) | ORAL | Status: DC
  Filled 2015-02-06: qty 40

## 2015-02-06 MED ORDER — 0.9 % SODIUM CHLORIDE (POUR BTL) OPTIME
TOPICAL | Status: DC | PRN
  Administered 2015-02-06 (×3): 1000 mL

## 2015-02-06 MED ORDER — POTASSIUM CHLORIDE 10 MEQ/50ML IV SOLN
10.0000 meq | Freq: Every day | INTRAVENOUS | Status: DC | PRN
  Filled 2015-02-06: qty 50

## 2015-02-06 MED ORDER — GLYCOPYRROLATE 0.2 MG/ML IJ SOLN
INTRAMUSCULAR | Status: AC
  Filled 2015-02-06: qty 3

## 2015-02-06 MED ORDER — FENTANYL CITRATE 0.05 MG/ML IJ SOLN
INTRAMUSCULAR | Status: DC | PRN
  Administered 2015-02-06: 50 ug via INTRAVENOUS
  Administered 2015-02-06: 25 ug via INTRAVENOUS
  Administered 2015-02-06: 50 ug via INTRAVENOUS
  Administered 2015-02-06: 100 ug via INTRAVENOUS
  Administered 2015-02-06 (×2): 50 ug via INTRAVENOUS
  Administered 2015-02-06: 25 ug via INTRAVENOUS

## 2015-02-06 MED ORDER — HYDROMORPHONE HCL 1 MG/ML IJ SOLN
INTRAMUSCULAR | Status: AC
  Filled 2015-02-06: qty 2

## 2015-02-06 MED ORDER — DIPHENHYDRAMINE HCL 12.5 MG/5ML PO ELIX
12.5000 mg | ORAL_SOLUTION | Freq: Four times a day (QID) | ORAL | Status: DC | PRN
  Filled 2015-02-06: qty 5

## 2015-02-06 MED ORDER — NALOXONE HCL 0.4 MG/ML IJ SOLN: 0.4000 mg | INTRAMUSCULAR | Status: DC | PRN

## 2015-02-06 MED ORDER — ACETAMINOPHEN 500 MG PO TABS
1000.0000 mg | ORAL_TABLET | Freq: Four times a day (QID) | ORAL | Status: DC
  Administered 2015-02-07 – 2015-02-11 (×18): 1000 mg via ORAL
  Filled 2015-02-06 (×19): qty 2

## 2015-02-06 MED ORDER — SUCCINYLCHOLINE CHLORIDE 20 MG/ML IJ SOLN
INTRAMUSCULAR | Status: AC
  Filled 2015-02-06: qty 1

## 2015-02-06 MED ORDER — GLYCOPYRROLATE 0.2 MG/ML IJ SOLN
INTRAMUSCULAR | Status: DC | PRN
  Administered 2015-02-06: 0.6 mg via INTRAVENOUS

## 2015-02-06 MED ORDER — FENTANYL 10 MCG/ML IV SOLN
INTRAVENOUS | Status: DC
  Administered 2015-02-06: 17:00:00 via INTRAVENOUS
  Administered 2015-02-06: 60 ug via INTRAVENOUS
  Administered 2015-02-07: 06:00:00 via INTRAVENOUS
  Administered 2015-02-07: 255 ug via INTRAVENOUS
  Administered 2015-02-07: 13:00:00 via INTRAVENOUS
  Administered 2015-02-07: 165 ug via INTRAVENOUS
  Administered 2015-02-07: 205.9 ug via INTRAVENOUS
  Administered 2015-02-07: 195 ug via INTRAVENOUS
  Administered 2015-02-07: 165 ug via INTRAVENOUS
  Administered 2015-02-08: 120 ug via INTRAVENOUS
  Administered 2015-02-08: 90 ug via INTRAVENOUS
  Administered 2015-02-08: 30 ug via INTRAVENOUS
  Filled 2015-02-06 (×4): qty 50

## 2015-02-06 MED ORDER — DEXTROSE-NACL 5-0.9 % IV SOLN
INTRAVENOUS | Status: DC
  Administered 2015-02-06 – 2015-02-07 (×2): via INTRAVENOUS

## 2015-02-06 MED ORDER — LACTATED RINGERS IV SOLN
Freq: Once | INTRAVENOUS | Status: DC
Start: 2015-02-06 — End: 2015-02-06

## 2015-02-06 SURGICAL SUPPLY — 84 items
APPLICATOR TIP COSEAL (VASCULAR PRODUCTS) IMPLANT
APPLICATOR TIP EXT COSEAL (VASCULAR PRODUCTS) IMPLANT
BLADE SURG 11 STRL SS (BLADE) IMPLANT
BRUSH CYTOL CELLEBRITY 1.5X140 (MISCELLANEOUS) IMPLANT
CANISTER SUCTION 2500CC (MISCELLANEOUS) ×3 IMPLANT
CATH KIT ON Q 5IN SLV (PAIN MANAGEMENT) IMPLANT
CATH THORACIC 28FR (CATHETERS) IMPLANT
CATH THORACIC 36FR (CATHETERS) IMPLANT
CATH THORACIC 36FR RT ANG (CATHETERS) IMPLANT
CLEANER TIP ELECTROSURG 2X2 (MISCELLANEOUS) ×3 IMPLANT
CLIP TI MEDIUM 6 (CLIP) ×3 IMPLANT
CONN ST 1/4X3/8  BEN (MISCELLANEOUS) ×2
CONN ST 1/4X3/8 BEN (MISCELLANEOUS) ×4 IMPLANT
CONN Y 3/8X3/8X3/8  BEN (MISCELLANEOUS)
CONN Y 3/8X3/8X3/8 BEN (MISCELLANEOUS) IMPLANT
CONT SPEC 4OZ CLIKSEAL STRL BL (MISCELLANEOUS) ×6 IMPLANT
COVER SURGICAL LIGHT HANDLE (MISCELLANEOUS) IMPLANT
COVER TABLE BACK 60X90 (DRAPES) IMPLANT
DERMABOND ADVANCED (GAUZE/BANDAGES/DRESSINGS)
DERMABOND ADVANCED .7 DNX12 (GAUZE/BANDAGES/DRESSINGS) IMPLANT
DRAIN CHANNEL 28F RND 3/8 FF (WOUND CARE) ×6 IMPLANT
DRAPE LAPAROSCOPIC ABDOMINAL (DRAPES) ×3 IMPLANT
DRAPE WARM FLUID 44X44 (DRAPE) ×3 IMPLANT
DRILL BIT 7/64X5 (BIT) IMPLANT
ELECT BLADE 4.0 EZ CLEAN MEGAD (MISCELLANEOUS) ×3
ELECT REM PT RETURN 9FT ADLT (ELECTROSURGICAL) ×3
ELECTRODE BLDE 4.0 EZ CLN MEGD (MISCELLANEOUS) ×2 IMPLANT
ELECTRODE REM PT RTRN 9FT ADLT (ELECTROSURGICAL) ×2 IMPLANT
FORCEPS BIOP RJ4 1.8 (CUTTING FORCEPS) IMPLANT
GAUZE SPONGE 4X4 12PLY STRL (GAUZE/BANDAGES/DRESSINGS) ×3 IMPLANT
GLOVE BIO SURGEON STRL SZ 6.5 (GLOVE) ×6 IMPLANT
GLOVE SURG SS PI 7.0 STRL IVOR (GLOVE) ×6 IMPLANT
GOWN STRL REUS W/ TWL LRG LVL3 (GOWN DISPOSABLE) ×10 IMPLANT
GOWN STRL REUS W/TWL LRG LVL3 (GOWN DISPOSABLE) ×5
KIT BASIN OR (CUSTOM PROCEDURE TRAY) ×3 IMPLANT
KIT CLEAN ENDO COMPLIANCE (KITS) ×3 IMPLANT
KIT ROOM TURNOVER OR (KITS) ×3 IMPLANT
KIT SUCTION CATH 14FR (SUCTIONS) ×3 IMPLANT
LIQUID BAND (GAUZE/BANDAGES/DRESSINGS) ×3 IMPLANT
MARKER SKIN DUAL TIP RULER LAB (MISCELLANEOUS) IMPLANT
NEEDLE BIOPSY TRANSBRONCH 21G (NEEDLE) IMPLANT
NS IRRIG 1000ML POUR BTL (IV SOLUTION) ×9 IMPLANT
OIL SILICONE PENTAX (PARTS (SERVICE/REPAIRS)) ×3 IMPLANT
PACK CHEST (CUSTOM PROCEDURE TRAY) ×3 IMPLANT
PAD ARMBOARD 7.5X6 YLW CONV (MISCELLANEOUS) ×6 IMPLANT
PASSER SUT SWANSON 36MM LOOP (INSTRUMENTS) IMPLANT
SCISSORS LAP 5X35 DISP (ENDOMECHANICALS) IMPLANT
SEALANT PROGEL (MISCELLANEOUS) IMPLANT
SEALANT SURG COSEAL 4ML (VASCULAR PRODUCTS) IMPLANT
SEALANT SURG COSEAL 8ML (VASCULAR PRODUCTS) IMPLANT
SOLUTION ANTI FOG 6CC (MISCELLANEOUS) ×3 IMPLANT
SPONGE GAUZE 4X4 12PLY STER LF (GAUZE/BANDAGES/DRESSINGS) ×3 IMPLANT
SUT PROLENE 3 0 SH DA (SUTURE) IMPLANT
SUT PROLENE 4 0 RB 1 (SUTURE)
SUT PROLENE 4-0 RB1 .5 CRCL 36 (SUTURE) IMPLANT
SUT SILK  1 MH (SUTURE) ×2
SUT SILK 1 MH (SUTURE) ×4 IMPLANT
SUT SILK 1 TIES 10X30 (SUTURE) ×3 IMPLANT
SUT SILK 2 0SH CR/8 30 (SUTURE) IMPLANT
SUT SILK 3 0SH CR/8 30 (SUTURE) IMPLANT
SUT VIC AB 1 CTX 18 (SUTURE) ×3 IMPLANT
SUT VIC AB 1 CTX 36 (SUTURE) ×2
SUT VIC AB 1 CTX36XBRD ANBCTR (SUTURE) ×4 IMPLANT
SUT VIC AB 2-0 CTX 36 (SUTURE) ×3 IMPLANT
SUT VIC AB 3-0 X1 27 (SUTURE) ×3 IMPLANT
SUT VICRYL 0 UR6 27IN ABS (SUTURE) IMPLANT
SUT VICRYL 2 TP 1 (SUTURE) IMPLANT
SWAB COLLECTION DEVICE MRSA (MISCELLANEOUS) IMPLANT
SYR 20ML ECCENTRIC (SYRINGE) IMPLANT
SYSTEM SAHARA CHEST DRAIN ATS (WOUND CARE) ×3 IMPLANT
TAPE CLOTH 4X10 WHT NS (GAUZE/BANDAGES/DRESSINGS) ×3 IMPLANT
TAPE CLOTH SURG 4X10 WHT LF (GAUZE/BANDAGES/DRESSINGS) ×3 IMPLANT
TAPE UMBILICAL COTTON 1/8X30 (MISCELLANEOUS) ×3 IMPLANT
TIP APPLICATOR SPRAY EXTEND 16 (VASCULAR PRODUCTS) IMPLANT
TOWEL OR 17X24 6PK STRL BLUE (TOWEL DISPOSABLE) IMPLANT
TOWEL OR 17X26 10 PK STRL BLUE (TOWEL DISPOSABLE) IMPLANT
TRAP SPECIMEN MUCOUS 40CC (MISCELLANEOUS) ×3 IMPLANT
TRAY FOLEY CATH 14FRSI W/METER (CATHETERS) IMPLANT
TRAY FOLEY CATH 16FRSI W/METER (SET/KITS/TRAYS/PACK) ×3 IMPLANT
TROCAR XCEL BLUNT TIP 100MML (ENDOMECHANICALS) IMPLANT
TUBE ANAEROBIC SPECIMEN COL (MISCELLANEOUS) IMPLANT
TUBE CONNECTING 20X1/4 (TUBING) ×3 IMPLANT
TUNNELER SHEATH ON-Q 11GX8 DSP (PAIN MANAGEMENT) IMPLANT
WATER STERILE IRR 1000ML POUR (IV SOLUTION) ×3 IMPLANT

## 2015-02-06 NOTE — Brief Op Note (Addendum)
01/31/2015 - 02/06/2015       [address]       [place] [address]             [phone]     01/31/2015 - 02/06/2015  4:20 PM  PATIENT:  Peggy Thomas  49 y.o. female  PRE-OPERATIVE DIAGNOSIS:  EMPYEMA  POST-OPERATIVE DIAGNOSIS:  EMPYEMA  PROCEDURE:  Procedure(s): VIDEO BRONCHOSCOPY LEFT VIDEO ASSISTED THORACOSCOPY (VATS)/DRAINAGE OF EMPYEMA  SURGEON:  Surgeon(s): Grace Isaac, MD  PHYSICIAN ASSISTANT: WAYNE GOLD PA-C  ANESTHESIA:   general  SPECIMEN:  Source of Specimen:  LEFT CHEST EMPYEMA/PLEURAL PEEL FOR CULTURES  DISPOSITION OF SPECIMEN:  Pathology  DRAINS: (2) Blake drain(s) in the LEFT HEMITHORAX   PATIENT CONDITION:  PACU - hemodynamically stable.  PRE-OPERATIVE WEIGHT: 86kg  EBL: MINIMAL  COMPLICATIONS: NO KNOWN

## 2015-02-06 NOTE — Progress Notes (Signed)
[facility] BRIEF SICU PROGRESS NOTE  Day of Surgery  S/P Procedure(s) (LRB): VIDEO BRONCHOSCOPY (N/A) LEFT VIDEO ASSISTED THORACOSCOPY (VATS)/DRAINAGE OF EMPYEMA (Left)   Breathing comfortably w/ adequate analgesia NSR w/ stable BP Low volume thin serosanguinous chest tube output  Plan: Continue routine early postop  Rexene Alberts 02/06/2015 10:50 PM

## 2015-02-06 NOTE — Progress Notes (Signed)
Subjective: No acute events overnight.  Continues to have dyspnea.  Seen by CVTS 3/28, plan for VATS today Peggy Thomas).  Somewhat anxious regarding VATS; however, is ready for it as she states she is tired of not being able to breathe.  Objective: Vital signs in last 24 hours: Blood pressure 130/80, pulse 98, temperature 97.7 F (36.5 C), temperature source Oral, resp. rate 20, height 5\' 2"  (1.575 m), weight 86.592 kg (190 lb 14.4 oz), last menstrual period 08/10/2010, SpO2 96 %.  Intake/Output from previous day: 03/28 0701 - 03/29 0700 In: -  Out: 450 [Urine:450]  Thoracentesis Labs 3/27 >>> WBC 3100 (92% neuts), LDH 1822.  Cultures pending   Physical Exam:  Pleasant adult female in nad Neck without LN or TMG Chest with very diminished bs on left, + egophany on left with dullness to percussion throughout left. Cor with RRR, no M/R/G abd soft, bs+, nontender LE without significant edema, no cyanosis Alert and oriented, moves all 4    Lab Results:  Recent Labs  02/05/15 1849 02/06/15 0309  WBC 21.5* 22.2*  HGB 11.4* 10.8*  HCT 34.8* 33.1*  PLT 220 243   BMET  Recent Labs  02/05/15 1849 02/06/15 0309  NA 130* 131*  K 3.8 4.1  CL 95* 94*  CO2 31 27  GLUCOSE 157* 117*  BUN <5* 6  CREATININE 1.10 1.17*  CALCIUM 8.5 8.8    Studies/Results: Dg Chest 2 View  02/05/2015   CLINICAL DATA:  Left-sided pleural effusion, shortness of Breath  EXAM: CHEST  2 VIEW  COMPARISON:  02/04/2015  FINDINGS: Cardiac shadow is stable. A large left-sided complicated pleural effusion is again seen there is likely some underlying consolidation as well. The right lung remains clear. 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 No other focal abnormality is noted.  IMPRESSION: Stable changes in the left hemi thorax consistent with a complicated pleural effusion and likely underlying consolidation. The overall appearance has worsened slightly in the interval from the prior exam.    Electronically Signed   By: Inez Catalina M.D.   On: 02/05/2015 07:47   Ct Chest Wo Contrast  02/05/2015   CLINICAL DATA:  Dyspnea and persistent fevers. Left pleural effusion/ empyema evaluation.  EXAM: CT CHEST WITHOUT CONTRAST  TECHNIQUE: Multidetector CT imaging of the chest was performed following the standard protocol without IV contrast.  COMPARISON:  Chest radiographs earlier today.  Chest CT 02/02/2015.  FINDINGS: Small prevascular lymph nodes measure up to 10 mm in short axis, slightly larger than on the prior study and most likely reactive. No enlarged hilar lymph nodes are identified, however evaluation is limited by lack of IV contrast, particularly on the left. There is mild rightward mediastinal shift. The heart is normal in size. There is no pericardial effusion.  Subsegmental atelectasis is again seen in the medial right upper lobe and right middle lobe. There is a large left pleural effusion, increased in size from the prior CT. The effusion appears mildly loculated at the apex and anteriorly. There is compressive atelectasis resulting in near complete collapse of the left lower lobe, complete collapse of lingula, and partial collapse of the left upper lobe with a small amount of aerated lung at the apex.  The visualized portion of the upper abdomen demonstrate sequelae of prior cholecystectomy and splenic artery embolization. 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 Nonobstructing left renal calculi are noted. No acute osseous abnormality is  identified.  IMPRESSION: Large, partially loculated left pleural effusion with left lung compressive atelectasis.   Electronically Signed   By: Logan Bores   On: 02/05/2015 15:33   US Thoracentesis Asp Pleural Space W/img Guide  02/04/2015   INDICATION: Symptomatic L sided pleural effusion  EXAM: US THORACENTESIS ASP PLEURAL SPACE W/IMG GUIDE  COMPARISON:  None.  MEDICATIONS: 10 cc 1% lidocaine  COMPLICATIONS:  None immediate  TECHNIQUE: Informed written consent was obtained from the patient after a discussion of the risks, benefits and alternatives to treatment. A timeout was performed prior to the initiation of the procedure.  Initial ultrasound scanning demonstrates a left pleural effusion. The lower chest was prepped and draped in the usual sterile fashion. 1% lidocaine was used for local anesthesia.  Under direct ultrasound guidance, a 19 gauge, 7-cm, Yueh catheter was introduced. An ultrasound image was saved for documentation purposes. the thoracentesis was performed. The catheter was removed and a dressing was applied. The patient tolerated the procedure well without immediate post procedural complication. The patient was escorted to have an upright chest radiograph.  FINDINGS: A total of approximately 100 cc of cloudy yellow fluid was removed. Requested samples were sent to the laboratory.  IMPRESSION: Successful ultrasound-guided L sided thoracentesis yielding 100 cc of pleural fluid.  Pocket small and loculated.  Withdrew what I was able to.  Read by:  Lavonia Drafts [facility]   Electronically Signed   By: Corrie Mckusick D.O.   On: 02/04/2015 11:37    Assessment/Plan:  Acute hypoxic respiratory failure - Continue supplemental O2 as needed to maintain SpO2 > 92%.  Small left pleural fluid by U/S - s/p thoracentesis 3/27 with labs c/w complicated parapneumonic effusion.  High LDH, low glucose, high neutrophil count all suggestive of empyema.  Effusion significantly enlarged - Scheduled for VATS today with Dr. Servando Thomas, appreciate his help here - Vanc / Zosyn started 3/27. - f/u cultures on pleural fluid.  Severe left lung atx due to pleural fluid +/- HCAP.  - Continue nebs, IS, OOB. - Pain control.   If pt has no complications with surgery and she does not require mechanical ventilation post op, PCCM will sign off.  Please do not hesitate to call back if we can be of any further assistance.  We will  check on her tomorrow post-procedure   Montey Hora, [facility] Pager: [phone]  or [phone] 02/06/2015, 10:29 AM   Attending Note:  I have examined patient, reviewed labs, studies and notes. I have discussed the case with Junius Roads, and I agree with the data and plans as amended above.  Baltazar Apo, MD, PhD 02/06/2015, 2:41 PM [facility] [phone] or if no answer [phone]

## 2015-02-06 NOTE — Transfer of Care (Signed)
Immediate Anesthesia Transfer of Care Note  Patient: Peggy Thomas  Procedure(s) Performed: Procedure(s) with comments: VIDEO BRONCHOSCOPY (N/A) LEFT VIDEO ASSISTED THORACOSCOPY (VATS)/DRAINAGE OF EMPYEMA (Left) - (L)VATS, DRAINAGE OF EMPYEMA  Patient Location: PACU  Anesthesia Type:General  Level of Consciousness: awake, patient cooperative and responds to stimulation  Airway & Oxygen Therapy: Patient Spontanous Breathing and Patient connected to face mask oxygen  Post-op Assessment: Report given to RN and Post -op Vital signs reviewed and stable  Post vital signs: Reviewed and stable  Last Vitals:  Filed Vitals:   02/06/15 0452  BP: 130/80  Pulse: 98  Temp: 36.5 C  Resp: 20    Complications: No apparent anesthesia complications

## 2015-02-06 NOTE — Anesthesia Preprocedure Evaluation (Signed)
Anesthesia Evaluation  Patient identified by MRN, date of birth, ID band Patient awake    Reviewed: Allergy & Precautions, NPO status , Patient's Chart, lab work & pertinent test results  Airway Mallampati: II  TM Distance: >3 FB Neck ROM: Full    Dental no notable dental hx.    Pulmonary Current Smoker,  breath sounds clear to auscultation  Pulmonary exam normal       Cardiovascular negative cardio ROS  Rhythm:Regular Rate:Normal     Neuro/Psych negative neurological ROS  negative psych ROS   GI/Hepatic negative GI ROS, Neg liver ROS,   Endo/Other  Hypothyroidism   Renal/GU negative Renal ROS  negative genitourinary   Musculoskeletal negative musculoskeletal ROS (+)   Abdominal   Peds negative pediatric ROS (+)  Hematology  (+) anemia , Chronic idiopathic thrombocytopenia  MILD--  HEMATOLOGIST-- DR ENNEVER Aneurysm  on spleen-watching       Anesthesia Other Findings   Reproductive/Obstetrics negative OB ROS                             Anesthesia Physical Anesthesia Plan  ASA: III  Anesthesia Plan: General   Post-op Pain Management:    Induction: Intravenous  Airway Management Planned: Double Lumen EBT  Additional Equipment: Arterial line and CVP  Intra-op Plan:   Post-operative Plan: Extubation in OR  Informed Consent: I have reviewed the patients History and Physical, chart, labs and discussed the procedure including the risks, benefits and alternatives for the proposed anesthesia with the patient or authorized representative who has indicated his/her understanding and acceptance.   Dental advisory given  Plan Discussed with: CRNA and Surgeon  Anesthesia Plan Comments:         Anesthesia Quick Evaluation

## 2015-02-06 NOTE — Anesthesia Postprocedure Evaluation (Signed)
  Anesthesia Post-op Note  Patient: Peggy Thomas  Procedure(s) Performed: Procedure(s) with comments: VIDEO BRONCHOSCOPY (N/A) LEFT VIDEO ASSISTED THORACOSCOPY (VATS)/DRAINAGE OF EMPYEMA (Left) - (L)VATS, DRAINAGE OF EMPYEMA  Patient Location: PACU  Anesthesia Type: General   Level of Consciousness: awake, alert  and oriented  Airway and Oxygen Therapy: Patient Spontanous Breathing  Post-op Pain: mild  Post-op Assessment: Post-op Vital signs reviewed  Post-op Vital Signs: Reviewed  Last Vitals:  Filed Vitals:   02/06/15 1728  BP:   Pulse: 93  Temp:   Resp:     Complications: No apparent anesthesia complications

## 2015-02-06 NOTE — Anesthesia Procedure Notes (Signed)
Procedure Name: Intubation Date/Time: 02/06/2015 2:02 PM Performed by: Maeola Harman Pre-anesthesia Checklist: Patient identified, Emergency Drugs available, Suction available, Patient being monitored and Timeout performed Patient Re-evaluated:Patient Re-evaluated prior to inductionOxygen Delivery Method: Circle system utilized Preoxygenation: Pre-oxygenation with 100% oxygen Intubation Type: IV induction Ventilation: Mask ventilation without difficulty Laryngoscope Size: 3 and Mac Grade View: Grade I Tube type: Oral Tube size: 8.5 mm Number of attempts: 1 Airway Equipment and Method: Stylet Placement Confirmation: ETT inserted through vocal cords under direct vision,  positive ETCO2 and breath sounds checked- equal and bilateral Secured at: 21 cm Tube secured with: Tape Dental Injury: Teeth and Oropharynx as per pre-operative assessment

## 2015-02-06 NOTE — Progress Notes (Signed)
Subjective  -   The patient is in the preoperative area, awaiting VATS.    Physical Exam:  Abdomen soft Respirations nonlabored CV: Regular rate and rhythm    Assessment/Plan:    ID: Appreciate input from Dr. Megan Salon.  Current recommendation is to continue Zosyn and add fluconazole.  Vancomycin will be discontinued Pulmonary: Appreciate consultation from Dr. Servando Snare who is planning on a VATS today to treat a empyema.   Denver Harder IV, V. WELLS 02/06/2015 1:47 PM --  Filed Vitals:   02/06/15 0452  BP: 130/80  Pulse: 98  Temp: 97.7 F (36.5 C)  Resp: 20    Intake/Output Summary (Last 24 hours) at 02/06/15 1347 Last data filed at 02/06/15 0815  Gross per 24 hour  Intake      0 ml  Output    450 ml  Net   -450 ml     Laboratory CBC    Component Value Date/Time   WBC 22.2* 02/06/2015 0309   WBC 6.3 06/11/2012 1012   HGB 10.8* 02/06/2015 0309   HGB 15.6 06/11/2012 1012   HCT 33.1* 02/06/2015 0309   HCT 44.0 06/11/2012 1012   PLT 243 02/06/2015 0309   PLT 108* 06/11/2012 1012    BMET    Component Value Date/Time   NA 131* 02/06/2015 0309   K 4.1 02/06/2015 0309   CL 94* 02/06/2015 0309   CO2 27 02/06/2015 0309   GLUCOSE 117* 02/06/2015 0309   BUN 6 02/06/2015 0309   CREATININE 1.17* 02/06/2015 0309   CREATININE 0.83 03/31/2014 1044   CALCIUM 8.8 02/06/2015 0309   GFRNONAA 54* 02/06/2015 0309   GFRAA 63* 02/06/2015 0309    COAG Lab Results  Component Value Date   INR 1.31 02/06/2015   INR 1.10 01/31/2015   No results found for: PTT  Antibiotics Anti-infectives    Start     Dose/Rate Route Frequency Ordered Stop   02/06/15 1000  [MAR Hold]  fluconazole (DIFLUCAN) tablet 200 mg     (MAR Hold since 02/06/15 1247)   200 mg Oral Daily 02/05/15 1125     02/05/15 1130  fluconazole (DIFLUCAN) tablet 400 mg     400 mg Oral Daily 02/05/15 1125 02/05/15 1544   02/05/15 0200  vancomycin (VANCOCIN) IVPB 1000 mg/200 mL premix  Status:  Discontinued      1,000 mg 200 mL/hr over 60 Minutes Intravenous Every 12 hours 02/04/15 1254 02/05/15 1125   02/04/15 1400  [MAR Hold]  piperacillin-tazobactam (ZOSYN) IVPB 3.375 g     (MAR Hold since 02/06/15 1247)   3.375 g 12.5 mL/hr over 240 Minutes Intravenous 3 times per day 02/04/15 1254     02/04/15 1400  vancomycin (VANCOCIN) 1,500 mg in sodium chloride 0.9 % 500 mL IVPB     1,500 mg 250 mL/hr over 120 Minutes Intravenous  Once 02/04/15 1254 02/04/15 1802   02/04/15 1000  ciprofloxacin (CIPRO) tablet 500 mg  Status:  Discontinued     500 mg Oral 2 times daily 02/04/15 0836 02/04/15 1251   02/02/15 0000  amoxicillin-clavulanate (AUGMENTIN) 875-125 MG per tablet     1 tablet Oral 2 times daily 02/02/15 0804     02/01/15 1800  piperacillin-tazobactam (ZOSYN) IVPB 3.375 g  Status:  Discontinued     3.375 g 12.5 mL/hr over 240 Minutes Intravenous 3 times per day 02/01/15 1724 02/04/15 1238       V. Leia Alf, M.D. [facility]  Office: [phone] Pager:  [phone]

## 2015-02-07 ENCOUNTER — Encounter (HOSPITAL_COMMUNITY): Payer: Self-pay | Admitting: Cardiothoracic Surgery

## 2015-02-07 ENCOUNTER — Telehealth: Payer: Self-pay | Admitting: Surgery

## 2015-02-07 ENCOUNTER — Telehealth: Payer: Self-pay | Admitting: Vascular Surgery

## 2015-02-07 ENCOUNTER — Inpatient Hospital Stay (HOSPITAL_COMMUNITY): Payer: Managed Care, Other (non HMO)

## 2015-02-07 DIAGNOSIS — B9561 Methicillin susceptible Staphylococcus aureus infection as the cause of diseases classified elsewhere: Secondary | ICD-10-CM

## 2015-02-07 LAB — BLOOD GAS, ARTERIAL
Acid-Base Excess: 5.9 mmol/L — ABNORMAL HIGH (ref 0.0–2.0)
Bicarbonate: 30.6 mEq/L — ABNORMAL HIGH (ref 20.0–24.0)
DRAWN BY: 418751
FIO2: 0.36 %
O2 SAT: 96.5 %
PCO2 ART: 50.4 mmHg — AB (ref 35.0–45.0)
Patient temperature: 98.6
TCO2: 32.2 mmol/L (ref 0–100)
pH, Arterial: 7.4 (ref 7.350–7.450)
pO2, Arterial: 90.5 mmHg (ref 80.0–100.0)

## 2015-02-07 LAB — CBC
HEMATOCRIT: 29.2 % — AB (ref 36.0–46.0)
HEMOGLOBIN: 9.4 g/dL — AB (ref 12.0–15.0)
MCH: 29.7 pg (ref 26.0–34.0)
MCHC: 32.2 g/dL (ref 30.0–36.0)
MCV: 92.4 fL (ref 78.0–100.0)
PLATELETS: 223 10*3/uL (ref 150–400)
RBC: 3.16 MIL/uL — ABNORMAL LOW (ref 3.87–5.11)
RDW: 14.1 % (ref 11.5–15.5)
WBC: 14.5 10*3/uL — ABNORMAL HIGH (ref 4.0–10.5)

## 2015-02-07 LAB — BASIC METABOLIC PANEL
Anion gap: 3 — ABNORMAL LOW (ref 5–15)
BUN: 5 mg/dL — ABNORMAL LOW (ref 6–23)
CO2: 33 mmol/L — ABNORMAL HIGH (ref 19–32)
Calcium: 8 mg/dL — ABNORMAL LOW (ref 8.4–10.5)
Chloride: 97 mmol/L (ref 96–112)
Creatinine, Ser: 0.94 mg/dL (ref 0.50–1.10)
GFR calc non Af Amer: 71 mL/min — ABNORMAL LOW (ref >90)
GFR, EST AFRICAN AMERICAN: 82 mL/min — AB (ref >90)
Glucose, Bld: 134 mg/dL — ABNORMAL HIGH (ref 70–99)
Potassium: 3.9 mmol/L (ref 3.5–5.1)
Sodium: 133 mmol/L — ABNORMAL LOW (ref 135–145)

## 2015-02-07 LAB — BODY FLUID CULTURE: CULTURE: NO GROWTH

## 2015-02-07 LAB — PHOSPHORUS: Phosphorus: 2.7 mg/dL (ref 2.3–4.6)

## 2015-02-07 MED ORDER — ENOXAPARIN SODIUM 30 MG/0.3ML ~~LOC~~ SOLN
30.0000 mg | SUBCUTANEOUS | Status: DC
  Administered 2015-02-07 – 2015-02-10 (×4): 30 mg via SUBCUTANEOUS
  Filled 2015-02-07 (×5): qty 0.3

## 2015-02-07 MED ORDER — BISACODYL 10 MG RE SUPP
10.0000 mg | Freq: Once | RECTAL | Status: AC
  Administered 2015-02-07: 10 mg via RECTAL
  Filled 2015-02-07: qty 1

## 2015-02-07 NOTE — Progress Notes (Signed)
Patient ID: Peggy Thomas, female   DOB: Aug 22, 1966, 49 y.o.   MRN: [id number]         [facility]    Date of Admission:  01/31/2015           Day 7 piperacillin tazobactam        Day 3 fluconazole Principal Problem:   Splenic abscess Active Problems:   Pleural empyema   Dyspnea   Pleural effusion   Pleural effusion on left   Empyema   . acetaminophen  1,000 mg Oral 4 times per day   Or  . acetaminophen (TYLENOL) oral liquid 160 mg/5 mL  1,000 mg Oral 4 times per day  . bisacodyl  10 mg Oral Daily  . bisacodyl  10 mg Rectal Once  . enoxaparin (LOVENOX) injection  30 mg Subcutaneous Q24H  . fentaNYL   Intravenous 6 times per day  . fluconazole  200 mg Oral Daily  . levothyroxine  75 mcg Oral QAC breakfast  . pantoprazole  40 mg Oral Daily  . piperacillin-tazobactam (ZOSYN)  IV  3.375 g Intravenous 3 times per day  . senna-docusate  1 tablet Oral QHS    Subjective: She is still having some left-sided chest pain but feels much better after the VATS drainage of her left pleural empyema yesterday.  Review of Systems: Pertinent items are noted in HPI.  Past Medical History  Diagnosis Date  . Hypocalcemia   . Depression 04/30/2011  . Hypothyroidism 04/30/2011  . History of kidney stones   . Right ureteral stone   . Chronic idiopathic thrombocytopenia     MILD--   HEMATOLOGIST--  DR Marin Olp  . PONV (postoperative nausea and vomiting)   . 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 Smoking status: Current Every Day Smoker -- 1.00 packs/day for 30 years    Types: Cigarettes  . Smokeless tobacco: Never Used  . Alcohol Use: Yes     Comment: occasionally/socially    Family History  Problem Relation Age of Onset  . Diabetes Mother     type 2  . Asthma Mother   . Cancer Mother     skin cancer  . Cancer Father     bone  . Mental illness Sister     schizophrenia  . Alcohol abuse Maternal Grandmother   .  Stroke Maternal Grandmother   . Aneurysm Maternal Grandmother     brain aneurysm  . Other Maternal Grandfather     black lung  . Alzheimer's disease Paternal Grandmother   . Stroke Maternal Aunt    No Known Allergies  OBJECTIVE: Blood pressure 96/60, pulse 84, temperature 97.3 F (36.3 C), temperature source Oral, resp. rate 17, height 5\' 2"  (1.575 m), weight 190 lb 14.4 oz (86.592 kg), last menstrual period 08/10/2010, SpO2 97 %. General: She appears more comfortable sitting up in a chair Lungs: Clear but with some diminished breath sounds on the left Cor: Regular S1 and S2 with no murmurs Abdomen: Soft with moderate left-sided tenderness  Lab Results Lab Results  Component Value Date   WBC 14.5* 02/07/2015   HGB 9.4* 02/07/2015   HCT 29.2* 02/07/2015   MCV 92.4 02/07/2015   PLT 223 02/07/2015    Lab Results  Component Value Date   CREATININE 0.94 02/07/2015   BUN 5* 02/07/2015   NA 133* 02/07/2015   K 3.9 02/07/2015   CL 97 02/07/2015  CO2 33* 02/07/2015    Lab Results  Component Value Date   ALT 39* 02/06/2015   AST 28 02/06/2015   ALKPHOS 72 02/06/2015   BILITOT 1.1 02/06/2015     Microbiology: Recent Results (from the past 240 hour(s))  Culture, blood (routine x 2)     Status: None   Collection Time: 01/31/15  5:25 PM  Result Value Ref Range Status   Specimen Description BLOOD LEFT HAND  Final   Special Requests BOTTLES DRAWN AEROBIC ONLY 8CC  Final   Culture   Final    NO GROWTH 5 DAYS Performed at [facility]    Report Status 02/06/2015 FINAL  Final  Culture, blood (routine x 2)     Status: None   Collection Time: 01/31/15  5:30 PM  Result Value Ref Range Status   Specimen Description BLOOD RIGHT HAND  Final   Special Requests BOTTLES DRAWN AEROBIC ONLY 3CC  Final   Culture   Final    NO GROWTH 5 DAYS Performed at [facility]    Report Status 02/06/2015 FINAL  Final  Culture, routine-abscess     Status: None   Collection  Time: 02/01/15 10:55 AM  Result Value Ref Range Status   Specimen Description ABSCESS SPLEEN  Final   Special Requests NONE  Final   Gram Stain   Final    FEW WBC PRESENT,BOTH PMN AND MONONUCLEAR NO SQUAMOUS EPITHELIAL CELLS SEEN MODERATE GRAM POSITIVE RODS RARE YEAST Performed at [facility]    Culture   Final    FEW DIPHTHEROIDS(CORYNEBACTERIUM SPECIES) Note: Standardized susceptibility testing for this organism is not available. Performed at [facility]    Report Status 02/04/2015 FINAL  Final  Anaerobic culture     Status: None   Collection Time: 02/01/15 10:55 AM  Result Value Ref Range Status   Specimen Description ABSCESS SPLEEN  Final   Special Requests NONE  Final   Gram Stain   Final    FEW WBC PRESENT,BOTH PMN AND MONONUCLEAR NO SQUAMOUS EPITHELIAL CELLS SEEN MODERATE GRAM POSITIVE RODS RARE YEAST Performed at [facility]    Culture   Final    NO ANAEROBES ISOLATED Performed at [facility]    Report Status 02/06/2015 FINAL  Final  Fungus culture w smear     Status: None (Preliminary result)   Collection Time: 02/01/15 10:55 AM  Result Value Ref Range Status   Specimen Description ABSCESS SPLEEN  Final   Special Requests NONE  Final   Fungal Smear   Final    NO YEAST OR FUNGAL ELEMENTS SEEN Performed at [facility]    Culture   Final    CULTURE IN PROGRESS FOR FOUR WEEKS Performed at [facility]    Report Status PENDING  Incomplete  Body fluid culture     Status: None   Collection Time: 02/04/15 10:20 AM  Result Value Ref Range Status   Specimen Description PLEURAL LEFT FLUID  Final   Special Requests NONE  Final   Gram Stain   Final    FEW WBC PRESENT, PREDOMINANTLY PMN NO ORGANISMS SEEN Performed at [facility]    Culture   Final    NO GROWTH 3 DAYS Performed at [facility]    Report Status 02/07/2015 FINAL  Final  Surgical pcr screen     Status: Abnormal   Collection  Time: 02/06/15 12:21 PM  Result Value Ref Range Status  MRSA, PCR NEGATIVE NEGATIVE Final   Staphylococcus aureus POSITIVE (A) NEGATIVE Final    Comment:        The Xpert SA Assay (FDA approved for NASAL specimens in patients over 49 years of age), is one component of a comprehensive surveillance program.  Test performance has been validated by [facility] for patients greater than or equal to 65 year old. It is not intended to diagnose infection nor to guide or monitor treatment.   Culture, respiratory (NON-Expectorated)     Status: None (Preliminary result)   Collection Time: 02/06/15  2:08 PM  Result Value Ref Range Status   Specimen Description BRONCHIAL WASHINGS  Final   Special Requests NONE  Final   Gram Stain   Final    RARE WBC PRESENT, PREDOMINANTLY PMN NO SQUAMOUS EPITHELIAL CELLS SEEN NO ORGANISMS SEEN Performed at [facility]    Culture NO GROWTH Performed at [facility]   Final   Report Status PENDING  Incomplete  Anaerobic culture     Status: None (Preliminary result)   Collection Time: 02/06/15  3:25 PM  Result Value Ref Range Status   Specimen Description TISSUE LEFT LUNG  Final   Special Requests PATIENT ON FOLLOWING ZOSYN  Final   Gram Stain   Final    RARE WBC PRESENT,BOTH PMN AND MONONUCLEAR NO SQUAMOUS EPITHELIAL CELLS SEEN NO ORGANISMS SEEN Performed at [facility]    Culture PENDING  Incomplete   Report Status PENDING  Incomplete  Tissue culture     Status: None (Preliminary result)   Collection Time: 02/06/15  3:25 PM  Result Value Ref Range Status   Specimen Description TISSUE LEFT LUNG  Final   Special Requests PATIENT ON FOLLOWING ZOSYN  Final   Gram Stain   Final    RARE WBC PRESENT,BOTH PMN AND MONONUCLEAR NO SQUAMOUS EPITHELIAL CELLS SEEN NO ORGANISMS SEEN Performed at [facility]    Culture PENDING  Incomplete   Report Status PENDING  Incomplete  Anaerobic culture     Status: None  (Preliminary result)   Collection Time: 02/06/15  3:28 PM  Result Value Ref Range Status   Specimen Description ABSCESS LEFT LUNG  Final   Special Requests PATIENT ON FOLLOWING ZOSYN  Final   Gram Stain   Final    NO WBC SEEN NO SQUAMOUS EPITHELIAL CELLS SEEN NO ORGANISMS SEEN Performed at [facility]    Culture PENDING  Incomplete   Report Status PENDING  Incomplete  Culture, routine-abscess     Status: None (Preliminary result)   Collection Time: 02/06/15  3:28 PM  Result Value Ref Range Status   Specimen Description ABSCESS LEFT LUNG  Final   Special Requests PATIENT ON FOLLOWING ZOSYN  Final   Gram Stain   Final    NO WBC SEEN NO SQUAMOUS EPITHELIAL CELLS SEEN NO ORGANISMS SEEN Performed at [facility]    Culture PENDING  Incomplete   Report Status PENDING  Incomplete    Assessment: She is improving slowly on therapy for polymicrobial splenic abscess and left pleural empyema.  Plan: 1. Continue current antimicrobial regimen  Michel Bickers, MD [facility] [place] [phone] pager   [phone] cell 02/07/2015, 11:26 AM

## 2015-02-07 NOTE — Progress Notes (Signed)
ANTIBIOTIC CONSULT NOTE Pharmacy Consult for Zosyn Indication: pneumonia  No Known Allergies  Labs:  Recent Labs  02/05/15 1849 02/06/15 0309 02/07/15 0531  WBC 21.5* 22.2* 14.5*  HGB 11.4* 10.8* 9.4*  PLT 220 243 223  CREATININE 1.10 1.17* 0.94     Medications:  Anti-infectives    Start     Dose/Rate Route Frequency Ordered Stop   02/06/15 1000  fluconazole (DIFLUCAN) tablet 200 mg     200 mg Oral Daily 02/05/15 1125     02/05/15 1130  fluconazole (DIFLUCAN) tablet 400 mg     400 mg Oral Daily 02/05/15 1125 02/05/15 1544   02/05/15 0200  vancomycin (VANCOCIN) IVPB 1000 mg/200 mL premix  Status:  Discontinued     1,000 mg 200 mL/hr over 60 Minutes Intravenous Every 12 hours 02/04/15 1254 02/05/15 1125   02/04/15 1400  piperacillin-tazobactam (ZOSYN) IVPB 3.375 g     3.375 g 12.5 mL/hr over 240 Minutes Intravenous 3 times per day 02/04/15 1254     02/04/15 1400  vancomycin (VANCOCIN) 1,500 mg in sodium chloride 0.9 % 500 mL IVPB     1,500 mg 250 mL/hr over 120 Minutes Intravenous  Once 02/04/15 1254 02/04/15 1802   02/04/15 1000  ciprofloxacin (CIPRO) tablet 500 mg  Status:  Discontinued     500 mg Oral 2 times daily 02/04/15 0836 02/04/15 1251   02/02/15 0000  amoxicillin-clavulanate (AUGMENTIN) 875-125 MG per tablet     1 tablet Oral 2 times daily 02/02/15 0804     02/01/15 1800  piperacillin-tazobactam (ZOSYN) IVPB 3.375 g  Status:  Discontinued     3.375 g 12.5 mL/hr over 240 Minutes Intravenous 3 times per day 02/01/15 1724 02/04/15 1238     Assessment: Pt was initially started on abx for possible intra-abdominal infection s/p coiling embolization of splenic aneurysm.  Pt then had pleural fluid seen on ultrasound, pt underwent thoracentesis and now will continue on abx for pneumonia/empyema.  Today is zosyn day #7, now s/p VATS Vancomycin stopped 3/28    3/37 pleural fluid - ngtd   3/23 blood x 2 - ngtd 3/24 splenic abscess - few corynebacterium 3/24  abscess: fungal - ngtd 3/24 abscess: anaerobic - ngtd   3/24 fungal cx: ngtd  Goal of Therapy:  Appropriate dosing  Plan:  Continue Zosyn 3.375 grams iv Q 8 hours Continue to follow cultures, WBC, fever trend, Scr trend  Thank you. Anette Guarneri, PharmD [phone]  02/07/2015 11:06 AM

## 2015-02-07 NOTE — Progress Notes (Signed)
Subjective: Underwent VATS decortication and cleanout empyema 3/29. Having some CP from the chest tube but states that her breathing is much improved. Up to chair.   Objective: Vital signs in last 24 hours: Blood pressure 96/60, pulse 84, temperature 97.3 F (36.3 C), temperature source Oral, resp. rate 17, height 5\' 2"  (1.575 m), weight 86.592 kg (190 lb 14.4 oz), last menstrual period 08/10/2010, SpO2 97 %.  Intake/Output from previous day: 03/29 0701 - 03/30 0700 In: 2863.3 [I.V.:2763.3; IV Piggyback:100] Out: 1115 [Urine:665; Blood:200; Chest Tube:250]  Thoracentesis Labs 3/27 >>> WBC 3100 (92% neuts), LDH 1822.  Cultures negative  Physical Exam:  Pleasant adult female in nad Neck without LN or TMG Chest with very diminished bs on left, + egophany on left with dullness to percussion throughout left. Cor with RRR, no M/R/G abd soft, bs+, nontender LE without significant edema, no cyanosis Alert and oriented, moves all 4    Lab Results:  Recent Labs  02/05/15 1849 02/06/15 0309 02/07/15 0531  WBC 21.5* 22.2* 14.5*  HGB 11.4* 10.8* 9.4*  HCT 34.8* 33.1* 29.2*  PLT 220 243 223   BMET  Recent Labs  02/05/15 1849 02/06/15 0309 02/07/15 0531  NA 130* 131* 133*  K 3.8 4.1 3.9  CL 95* 94* 97  CO2 31 27 33*  GLUCOSE 157* 117* 134*  BUN <5* 6 5*  CREATININE 1.10 1.17* 0.94  CALCIUM 8.5 8.8 8.0*    Studies/Results: Ct Chest Wo Contrast  02/05/2015   CLINICAL DATA:  Dyspnea and persistent fevers. Left pleural effusion/ empyema evaluation.  EXAM: CT CHEST WITHOUT CONTRAST  TECHNIQUE: Multidetector CT imaging of the chest was performed following the standard protocol without IV contrast.  COMPARISON:  Chest radiographs earlier today.  Chest CT 02/02/2015.  FINDINGS: Small prevascular lymph nodes measure up to 10 mm in short axis, slightly larger than on the prior study and most likely reactive. No enlarged hilar lymph nodes are identified, however evaluation is limited  by lack of IV contrast, particularly on the left. There is mild rightward mediastinal shift. The heart is normal in size. There is no pericardial effusion.  Subsegmental atelectasis is again seen in the medial right upper lobe and right middle lobe. There is a large left pleural effusion, increased in size from the prior CT. The effusion appears mildly loculated at the apex and anteriorly. There is compressive atelectasis resulting in near complete collapse of the left lower lobe, complete collapse of lingula, and partial collapse of the left upper lobe with a small amount of aerated lung at the apex.  The visualized portion of the upper abdomen demonstrate sequelae of prior cholecystectomy and splenic artery embolization. 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 Nonobstructing left renal calculi are noted. No acute osseous abnormality is identified.  IMPRESSION: Large, partially loculated left pleural effusion with left lung compressive atelectasis.   Electronically Signed   By: Logan Bores   On: 02/05/2015 15:33   Dg Chest Port 1 View  02/07/2015   CLINICAL DATA:  History of VATS for drainage of left empyema, followup  EXAM: PORTABLE CHEST - 1 VIEW  COMPARISON:  Portable chest x-ray of 02/06/2015  FINDINGS: Aeration of the left lung has improved slightly. A left chest tube remains with volume loss and pleural and parenchymal opacity at the left lung base laterally. The right lung is clear. The right IJ central venous line is unchanged in position, and cardiomegaly is  stable.  IMPRESSION: Improved aeration of the left lung.  Left chest tube remains.   Electronically Signed   By: Ivar Drape M.D.   On: 02/07/2015 08:02   Dg Chest Port 1 View  02/06/2015   CLINICAL DATA:  Status post video-assisted thoracic surgery and internal jugular line placement  EXAM: PORTABLE CHEST - 1 VIEW  COMPARISON:  Multiple prior studies  FINDINGS: Right internal jugular central line  identified with tip to the cavoatrial junction. Cardiac silhouette unchanged but largely obscured by extensive opacity in the left thorax. Two left-sided chest tubes identified. Right lung is clear.  IMPRESSION: Central line as described with extensive opacity over the left thorax as previously seen. Tiny aerated portion of left apex as previously noted, which now demonstrates some opacity postoperatively as well.   Electronically Signed   By: Skipper Cliche M.D.   On: 02/06/2015 17:14    Assessment/Plan:  Acute hypoxic respiratory failure - Continue supplemental O2 as needed to maintain SpO2 > 92%. - anticipate improvement now that L lung atx resolving  L empyema, possibly related to recent splenic bx S/p L VATS decortication - Vanc / Zosyn started 3/27. - f/u cultures on pleural fluid post VATS  Severe left lung atx due to pleural fluid +/- HCAP. Anticipate improvement post-procedure - Continue nebs, IS, OOB. - Pain control.  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

## 2015-02-07 NOTE — Telephone Encounter (Signed)
Patient was still admitted when I called to give information. She would like to call us back when she has been discharged.   Her CT is scheduled for 02/16/15 @ 10:30 (she will need to pick up oral contrast at least 2 days prior) Her appt with [facility] is 02/19/2015 @ 10:30   When patient calls, please confirm appointments.  Peggy Thomas

## 2015-02-07 NOTE — Progress Notes (Signed)
      [address]       [place] [address]             [phone]      POD # 1 drainage of empyema  BP 101/63 mmHg  Pulse 84  Temp(Src) 97.4 F (36.3 C) (Oral)  Resp 18  Ht 5\' 2"  (1.575 m)  Wt 190 lb 14.4 oz (86.592 kg)  BMI 34.91 kg/m2  SpO2 94%  LMP 08/10/2010   Intake/Output Summary (Last 24 hours) at 02/07/15 1800 Last data filed at 02/07/15 1700  Gross per 24 hour  Intake 2468.33 ml  Output   1815 ml  Net 653.33 ml    Stable POD # 1  Continue current care  Steven C. Roxan Hockey, MD [facility] [phone]

## 2015-02-07 NOTE — Progress Notes (Addendum)
Progress Note    02/07/2015 7:39 AM 1 Day Post-Op  Subjective:  States as far as her lungs, she feels better since surgery, but is sore.  States her belly still has a feeling of fullness.  No BM recently, but minimal food intake.  Tm 99.4 HR 90's-100's NSR 61'W-431'V systolic 40% [id number]  ABx:   Zosyn Fluconazole  Filed Vitals:   02/07/15 0737  BP:   Pulse:   Temp:   Resp: 16    Physical Exam: Cardiac:  regular Lungs:  Decreased BS left lung anteriorly; - air leak Abdomen:  Soft, NT/ND  CBC    Component Value Date/Time   WBC 14.5* 02/07/2015 0531   WBC 6.3 06/11/2012 1012   RBC 3.16* 02/07/2015 0531   RBC 4.88 06/11/2012 1012   RBC 4.54 09/18/2011 0919   HGB 9.4* 02/07/2015 0531   HGB 15.6 06/11/2012 1012   HCT 29.2* 02/07/2015 0531   HCT 44.0 06/11/2012 1012   PLT 223 02/07/2015 0531   PLT 108* 06/11/2012 1012   MCV 92.4 02/07/2015 0531   MCV 90 06/11/2012 1012   MCH 29.7 02/07/2015 0531   MCH 32.0 06/11/2012 1012   MCHC 32.2 02/07/2015 0531   MCHC 35.5 06/11/2012 1012   RDW 14.1 02/07/2015 0531   RDW 12.5 06/11/2012 1012   LYMPHSABS 2.0 12/29/2014 1641   LYMPHSABS 2.3 06/11/2012 1012   MONOABS 1.1* 12/29/2014 1641   EOSABS 0.0 12/29/2014 1641   EOSABS 0.1 06/11/2012 1012   BASOSABS 0.0 12/29/2014 1641   BASOSABS 0.0 06/11/2012 1012    BMET    Component Value Date/Time   NA 133* 02/07/2015 0531   K 3.9 02/07/2015 0531   CL 97 02/07/2015 0531   CO2 33* 02/07/2015 0531   GLUCOSE 134* 02/07/2015 0531   BUN 5* 02/07/2015 0531   CREATININE 0.94 02/07/2015 0531   CREATININE 0.83 03/31/2014 1044   CALCIUM 8.0* 02/07/2015 0531   GFRNONAA 71* 02/07/2015 0531   GFRAA 82* 02/07/2015 0531    INR    Component Value Date/Time   INR 1.31 02/06/2015 0309     Intake/Output Summary (Last 24 hours) at 02/07/15 0739 Last data filed at 02/07/15 0700  Gross per 24 hour  Intake 2863.33 ml  Output   1115 ml  Net 1748.33 ml   Specimen Description  ABSCESS LEFT LUNG   Special Requests PATIENT ON FOLLOWING ZOSYN   Gram Stain NO WBC SEEN  NO SQUAMOUS EPITHELIAL CELLS SEEN  NO ORGANISMS SEEN            TISSUE LEFT LUNG    Special Requests PATIENT ON FOLLOWING ZOSYN   Gram Stain RARE WBC PRESENT,BOTH PMN AND MONONUCLEAR  NO SQUAMOUS EPITHELIAL CELLS SEEN  NO ORGANISMS SEEN            Specimen Description ABSCESS SPLEEN   Special Requests NONE   Gram Stain FEW WBC PRESENT,BOTH PMN AND MONONUCLEAR  NO SQUAMOUS EPITHELIAL CELLS SEEN  MODERATE GRAM POSITIVE RODS  RARE YEAST             Blood Cx 01/31/15 No growth  Assessment:  49 y.o. female is s/p:  1. Ultrasound-guided access, right femoral artery 2. Abdominal aortogram 3. Coil embolization, splenic artery 4. Third order catheterization 12/26/14 and left VATS with drainage of empyema  1 Day Post-Op   Plan: -pt feeling better this am following surgery -pt on Zosyn/fluconazole and vanc discontinued per ID -on PCA for pain -chest tube output 140cc last 12  hours with no air leak-management per CT surgery -no BM in several days, but minimal intake-will order dulcolax and nutrition consult. -DVT prophylaxis:  None at this time-start per CT surgery    Peggy Locket, PA-C [facility] [phone] 02/07/2015 7:39 AM    Agree with the above Continues to improve No back pain Increase mobiliization  Peggy Thomas

## 2015-02-07 NOTE — Telephone Encounter (Signed)
Waiting for return call regarding CT (needs oral contrast)

## 2015-02-07 NOTE — Progress Notes (Signed)
UR Completed.  [phone]  

## 2015-02-07 NOTE — Telephone Encounter (Signed)
-----   Message from Mena Goes, RN sent at 02/07/2015  1:14 PM EDT ----- Regarding: RE: Schedule No contrast BUN and creatinine is elevated, so he changed his mind ----- Message -----    From: Gena Fray    Sent: 02/07/2015  11:33 AM      To: Mena Goes, RN Subject: FW: Schedule                                   Kay-this staff message was confusing. The message says CT of the abdomen with IV contrast- no oral contrast. The order is in for CT abdomen without contrast.   I think it needs to be CTA abdomen, right? Could you correct the order.  Thanks! Hinton Dyer ----- Message -----    From: Mena Goes, RN    Sent: 02/05/2015   2:07 PM      To: [facility] Admin Pool Subject: Schedule                                         ----- Message -----    From: Ulyses Amor, PA-C    Sent: 02/02/2015   8:05 AM      To: [facility] Charge Pool  Dr. Trula Slade spleenic aspiration s/p aneurysm repair she needs to f/u 2 weeks from mon. She needs a ct of the abdomin with IV contrast no oral contrast. Thanks [facility]

## 2015-02-07 NOTE — Progress Notes (Signed)
INITIAL NUTRITION ASSESSMENT  DOCUMENTATION CODES Per approved criteria  -Obesity Unspecified   INTERVENTION: Magic cup TID with meals, each supplement provides 290 kcal and 9 grams of protein  NUTRITION DIAGNOSIS: Inadequate oral intake related to decreased appetite and SOB as evidenced by meal completion < 50%.   Goal: Pt to meet >/= 90% of their estimated nutrition needs   Monitor:  PO intake, supplement acceptance, weight trends, labs  Reason for Assessment: Consult for Poor PO  ASSESSMENT: Pt s/p splenic artery embolization 12/26/14 then was admitted on 3/23 for fevers, nausea, and vomiting.  Pt s/p thoracentesis 3/27 for large left pleural effusion treatment failed. 3/29 had left VATS and pt feels she can breathe better.  Pt endorses poor appetite due to hospitalization and trouble breathing. Pt declines ensure/boost and wants to try and eat more. Did agree to try magic cups.  No signs of fat or muscle depletion noted on exam. Pt was at her usual weight of 190 lb on admission. No further weight available and pt currently in chair.   Chest tube:  3/29: 250 ml  Height: Ht Readings from Last 1 Encounters:  01/31/15 5\' 2"  (1.575 m)    Weight: Wt Readings from Last 1 Encounters:  01/31/15 190 lb 14.4 oz (86.592 kg)  No new weight  Ideal Body Weight: 50 kg   % Ideal Body Weight: 173%  Wt Readings from Last 10 Encounters:  01/31/15 190 lb 14.4 oz (86.592 kg)  01/29/15 194 lb 8 oz (88.225 kg)  12/26/14 194 lb (87.998 kg)  11/29/14 200 lb 6 oz (90.89 kg)  11/27/14 192 lb (87.091 kg)  11/15/14 197 lb (89.359 kg)  08/23/14 193 lb 3.2 oz (87.635 kg)  06/27/14 193 lb (87.544 kg)  03/31/14 194 lb (87.998 kg)  09/20/13 194 lb (87.998 kg)    Usual Body Weight: 190 lb  % Usual Body Weight: 100%  BMI:  Body mass index is 34.91 kg/(m^2).  Estimated Nutritional Needs: Kcal: 1700-1900 Protein: 85-100 grams Fluid: > 1.7 L/day  Skin: incision  Diet Order: Diet  heart healthy/carb modified Room service appropriate?: Yes; Fluid consistency:: Thin  EDUCATION NEEDS: -No education needs identified at this time   Intake/Output Summary (Last 24 hours) at 02/07/15 1446 Last data filed at 02/07/15 1400  Gross per 24 hour  Intake 2793.33 ml  Output   1325 ml  Net 1468.33 ml    Last BM: 3/25 - per pt she will get a suppository today   Labs:   Recent Labs Lab 02/05/15 1849 02/06/15 0309 02/07/15 0531 02/07/15 0536  NA 130* 131* 133*  --   K 3.8 4.1 3.9  --   CL 95* 94* 97  --   CO2 31 27 33*  --   BUN <5* 6 5*  --   CREATININE 1.10 1.17* 0.94  --   CALCIUM 8.5 8.8 8.0*  --   PHOS  --   --   --  2.7  GLUCOSE 157* 117* 134*  --     CBG (last 3)  No results for input(s): GLUCAP in the last 72 hours.  Scheduled Meds: . acetaminophen  1,000 mg Oral 4 times per day   Or  . acetaminophen (TYLENOL) oral liquid 160 mg/5 mL  1,000 mg Oral 4 times per day  . bisacodyl  10 mg Oral Daily  . enoxaparin (LOVENOX) injection  30 mg Subcutaneous Q24H  . fentaNYL   Intravenous 6 times per day  . fluconazole  200 mg Oral Daily  . levothyroxine  75 mcg Oral QAC breakfast  . pantoprazole  40 mg Oral Daily  . piperacillin-tazobactam (ZOSYN)  IV  3.375 g Intravenous 3 times per day  . senna-docusate  1 tablet Oral QHS    Continuous Infusions: . dextrose 5 % and 0.9% NaCl 50 mL/hr at 02/07/15 [place], [place], [place] Pager [phone] After Hours Pager

## 2015-02-07 NOTE — Progress Notes (Addendum)
Patient ID: Peggy Thomas, female   DOB: 28-Jan-1966, 49 y.o.   MRN: [id number] TCTS DAILY ICU PROGRESS NOTE                   [address]            [place], [address]          [phone]   1 Day Post-Op Procedure(s) (LRB): VIDEO BRONCHOSCOPY (N/A) LEFT VIDEO ASSISTED THORACOSCOPY (VATS)/DRAINAGE OF EMPYEMA (Left)  Total Length of Stay:  LOS: 7 days   Subjective: Feels better, breathing better  Objective: Vital signs in last 24 hours: Temp:  [97.3 F (36.3 C)-99.4 F (37.4 C)] 97.3 F (36.3 C) (03/30 0752) Pulse Rate:  [85-103] 88 (03/30 0700) Cardiac Rhythm:  [-] Normal sinus rhythm (03/30 0400) Resp:  [10-25] 16 (03/30 0737) BP: (83-114)/(38-67) 102/38 mmHg (03/30 0700) SpO2:  [35 %-98 %] 35 % (03/30 0737) Arterial Line BP: (86-151)/(55-94) 111/56 mmHg (03/30 0700)  Filed Weights   01/31/15 1616  Weight: 190 lb 14.4 oz (86.592 kg)    Weight change:    Hemodynamic parameters for last 24 hours:    Intake/Output from previous day: 03/29 0701 - 03/30 0700 In: 2863.3 [I.V.:2763.3; IV Piggyback:100] Out: 1115 [Urine:665; Blood:200; Chest Tube:250]  Intake/Output this shift:    Current Meds: Scheduled Meds: . acetaminophen  1,000 mg Oral 4 times per day   Or  . acetaminophen (TYLENOL) oral liquid 160 mg/5 mL  1,000 mg Oral 4 times per day  . bisacodyl  10 mg Oral Daily  . bisacodyl  10 mg Rectal Once  . fentaNYL   Intravenous 6 times per day  . fluconazole  200 mg Oral Daily  . levothyroxine  75 mcg Oral QAC breakfast  . pantoprazole  40 mg Oral Daily  . piperacillin-tazobactam (ZOSYN)  IV  3.375 g Intravenous 3 times per day  . senna-docusate  1 tablet Oral QHS   Continuous Infusions: . dextrose 5 % and 0.9% NaCl 100 mL/hr at 02/07/15 0518   PRN Meds:.albuterol, alum & mag hydroxide-simeth, diphenhydrAMINE **OR** diphenhydrAMINE, guaiFENesin-dextromethorphan, hydrALAZINE, naloxone **AND** sodium chloride, ondansetron (ZOFRAN) IV,  oxyCODONE, phenol, potassium chloride, traMADol  General appearance: alert, cooperative and no distress Neurologic: intact Heart: regular rate and rhythm, S1, S2 normal, no murmur, click, rub or gallop Lungs: diminished breath sounds LLL Abdomen: soft, non-tender; bowel sounds normal; no masses,  no organomegaly Extremities: extremities normal, atraumatic, no cyanosis or edema and Homans sign is negative, no sign of DVT Wound: no air leak from chest tubes  Lab Results: CBC: Recent Labs  02/06/15 0309 02/07/15 0531  WBC 22.2* 14.5*  HGB 10.8* 9.4*  HCT 33.1* 29.2*  PLT 243 223   BMET:  Recent Labs  02/06/15 0309 02/07/15 0531  NA 131* 133*  K 4.1 3.9  CL 94* 97  CO2 27 33*  GLUCOSE 117* 134*  BUN 6 5*  CREATININE 1.17* 0.94  CALCIUM 8.8 8.0*    PT/INR:  Recent Labs  02/06/15 0309  LABPROT 16.4*  INR 1.31   Radiology: Dg Chest Port 1 View  02/06/2015   CLINICAL DATA:  Status post video-assisted thoracic surgery and internal jugular line placement  EXAM: PORTABLE CHEST - 1 VIEW  COMPARISON:  Multiple prior studies  FINDINGS: Right internal jugular central line identified with tip to the cavoatrial junction. Cardiac silhouette unchanged but largely obscured by extensive opacity in the left thorax. Two left-sided chest tubes identified. Right lung is  clear.  IMPRESSION: Central line as described with extensive opacity over the left thorax as previously seen. Tiny aerated portion of left apex as previously noted, which now demonstrates some opacity postoperatively as well.   Electronically Signed   By: Skipper Cliche M.D.   On: 02/06/2015 17:14   Ct Chest Wo Contrast  02/05/2015   CLINICAL DATA:  Dyspnea and persistent fevers. Left pleural effusion/ empyema evaluation.  EXAM: CT CHEST WITHOUT CONTRAST  TECHNIQUE: Multidetector CT imaging of the chest was performed following the standard protocol without IV contrast.  COMPARISON:  Chest radiographs earlier today.  Chest CT  02/02/2015.  FINDINGS: Small prevascular lymph nodes measure up to 10 mm in short axis, slightly larger than on the prior study and most likely reactive. No enlarged hilar lymph nodes are identified, however evaluation is limited by lack of IV contrast, particularly on the left. There is mild rightward mediastinal shift. The heart is normal in size. There is no pericardial effusion.  Subsegmental atelectasis is again seen in the medial right upper lobe and right middle lobe. There is a large left pleural effusion, increased in size from the prior CT. The effusion appears mildly loculated at the apex and anteriorly. There is compressive atelectasis resulting in near complete collapse of the left lower lobe, complete collapse of lingula, and partial collapse of the left upper lobe with a small amount of aerated lung at the apex.  The visualized portion of the upper abdomen demonstrate sequelae of prior cholecystectomy and splenic artery embolization. 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 Nonobstructing left renal calculi are noted. No acute osseous abnormality is identified.  IMPRESSION: Large, partially loculated left pleural effusion with left lung compressive atelectasis.   Electronically Signed   By: Logan Bores   On: 02/05/2015 15:33   Dg Chest Port 1 View  02/07/2015   CLINICAL DATA:  History of VATS for drainage of left empyema, followup  EXAM: PORTABLE CHEST - 1 VIEW  COMPARISON:  Portable chest x-ray of 02/06/2015  FINDINGS: Aeration of the left lung has improved slightly. A left chest tube remains with volume loss and pleural and parenchymal opacity at the left lung base laterally. The right lung is clear. The right IJ central venous line is unchanged in position, and cardiomegaly is stable.  IMPRESSION: Improved aeration of the left lung.  Left chest tube remains.   Electronically Signed   By: Ivar Drape M.D.   On: 02/07/2015 08:02    Assessment/Plan: S/P  Procedure(s) (LRB): VIDEO BRONCHOSCOPY (N/A) LEFT VIDEO ASSISTED THORACOSCOPY (VATS)/DRAINAGE OF EMPYEMA (Left) Mobilize Diuresis Continue ABX therapy due to preop  infection D/c a line and foley Expected Acute  Blood - loss Anemia  Grace Isaac 02/07/2015 8:12 AM

## 2015-02-08 ENCOUNTER — Inpatient Hospital Stay (HOSPITAL_COMMUNITY): Payer: Managed Care, Other (non HMO)

## 2015-02-08 DIAGNOSIS — B9689 Other specified bacterial agents as the cause of diseases classified elsewhere: Secondary | ICD-10-CM

## 2015-02-08 LAB — CBC
HEMATOCRIT: 30.5 % — AB (ref 36.0–46.0)
Hemoglobin: 9.7 g/dL — ABNORMAL LOW (ref 12.0–15.0)
MCH: 29.2 pg (ref 26.0–34.0)
MCHC: 31.8 g/dL (ref 30.0–36.0)
MCV: 91.9 fL (ref 78.0–100.0)
Platelets: 260 10*3/uL (ref 150–400)
RBC: 3.32 MIL/uL — ABNORMAL LOW (ref 3.87–5.11)
RDW: 14.2 % (ref 11.5–15.5)
WBC: 10.9 10*3/uL — AB (ref 4.0–10.5)

## 2015-02-08 LAB — COMPREHENSIVE METABOLIC PANEL
ALBUMIN: 1.8 g/dL — AB (ref 3.5–5.2)
ALK PHOS: 69 U/L (ref 39–117)
ALT: 29 U/L (ref 0–35)
AST: 28 U/L (ref 0–37)
Anion gap: 8 (ref 5–15)
CO2: 29 mmol/L (ref 19–32)
Calcium: 8.2 mg/dL — ABNORMAL LOW (ref 8.4–10.5)
Chloride: 100 mmol/L (ref 96–112)
Creatinine, Ser: 0.89 mg/dL (ref 0.50–1.10)
GFR calc non Af Amer: 75 mL/min — ABNORMAL LOW (ref >90)
GFR, EST AFRICAN AMERICAN: 87 mL/min — AB (ref >90)
Glucose, Bld: 120 mg/dL — ABNORMAL HIGH (ref 70–99)
POTASSIUM: 3.7 mmol/L (ref 3.5–5.1)
SODIUM: 137 mmol/L (ref 135–145)
TOTAL PROTEIN: 5.9 g/dL — AB (ref 6.0–8.3)
Total Bilirubin: 0.6 mg/dL (ref 0.3–1.2)

## 2015-02-08 MED ORDER — SODIUM CHLORIDE 0.9 % IV SOLN
3.0000 g | Freq: Four times a day (QID) | INTRAVENOUS | Status: DC
  Filled 2015-02-08 (×2): qty 3

## 2015-02-08 MED ORDER — SODIUM CHLORIDE 0.9 % IV SOLN
3.0000 g | Freq: Four times a day (QID) | INTRAVENOUS | Status: DC
  Administered 2015-02-08 – 2015-02-11 (×10): 3 g via INTRAVENOUS
  Filled 2015-02-08 (×12): qty 3

## 2015-02-08 MED ORDER — MAGNESIUM HYDROXIDE 400 MG/5ML PO SUSP
5.0000 mL | Freq: Every day | ORAL | Status: AC
  Filled 2015-02-08: qty 30

## 2015-02-08 NOTE — Progress Notes (Signed)
Patient ID: Peggy Thomas, female   DOB: 1965/11/11, 49 y.o.   MRN: [id number]         [facility]    Date of Admission:  01/31/2015           Day 8 piperacillin tazobactam        Day 4 fluconazole Principal Problem:   Splenic abscess Active Problems:   Pleural empyema   Dyspnea   Pleural effusion   Pleural effusion on left   Empyema   . acetaminophen  1,000 mg Oral 4 times per day   Or  . acetaminophen (TYLENOL) oral liquid 160 mg/5 mL  1,000 mg Oral 4 times per day  . bisacodyl  10 mg Oral Daily  . enoxaparin (LOVENOX) injection  30 mg Subcutaneous Q24H  . fluconazole  200 mg Oral Daily  . levothyroxine  75 mcg Oral QAC breakfast  . magnesium hydroxide  5 mL Oral Daily  . pantoprazole  40 mg Oral Daily  . piperacillin-tazobactam (ZOSYN)  IV  3.375 g Intravenous 3 times per day  . senna-docusate  1 tablet Oral QHS    Subjective: She is feeling better and has been walking in the hall with physical therapy.  Review of Systems: Pertinent items are noted in HPI.  Past Medical History  Diagnosis Date  . Hypocalcemia   . Depression 04/30/2011  . Hypothyroidism 04/30/2011  . History of kidney stones   . Right ureteral stone   . Chronic idiopathic thrombocytopenia     MILD--   HEMATOLOGIST--  DR Marin Olp  . PONV (postoperative nausea and vomiting)   . 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 Smoking status: Current Every Day Smoker -- 1.00 packs/day for 30 years    Types: Cigarettes  . Smokeless tobacco: Never Used  . Alcohol Use: Yes     Comment: occasionally/socially    Family History  Problem Relation Age of Onset  . Diabetes Mother     type 2  . Asthma Mother   . Cancer Mother     skin cancer  . Cancer Father     bone  . Mental illness Sister     schizophrenia  . Alcohol abuse Maternal Grandmother   . Stroke Maternal Grandmother   . Aneurysm Maternal Grandmother     brain aneurysm  . Other  Maternal Grandfather     black lung  . Alzheimer's disease Paternal Grandmother   . Stroke Maternal Aunt    No Known Allergies  OBJECTIVE: Blood pressure 104/65, pulse 77, temperature 98.1 F (36.7 C), temperature source Oral, resp. rate 20, height 5\' 2"  (1.575 m), weight 199 lb 11.8 oz (90.6 kg), last menstrual period 08/10/2010, SpO2 94 %.   General: She is more comfortable Lungs: Clear but with some diminished breath sounds on the left Cor: Regular S1 and S2 with no murmurs Abdomen: Soft   Lab Results Lab Results  Component Value Date   WBC 10.9* 02/08/2015   HGB 9.7* 02/08/2015   HCT 30.5* 02/08/2015   MCV 91.9 02/08/2015   PLT 260 02/08/2015    Lab Results  Component Value Date   CREATININE 0.89 02/08/2015   BUN <5* 02/08/2015   NA 137 02/08/2015   K 3.7 02/08/2015   CL 100 02/08/2015   CO2 29 02/08/2015    Lab Results  Component Value Date   ALT 29 02/08/2015   AST 28 02/08/2015  ALKPHOS 69 02/08/2015   BILITOT 0.6 02/08/2015     Microbiology: Recent Results (from the past 240 hour(s))  Culture, blood (routine x 2)     Status: None   Collection Time: 01/31/15  5:25 PM  Result Value Ref Range Status   Specimen Description BLOOD LEFT HAND  Final   Special Requests BOTTLES DRAWN AEROBIC ONLY 8CC  Final   Culture   Final    NO GROWTH 5 DAYS Performed at [facility]    Report Status 02/06/2015 FINAL  Final  Culture, blood (routine x 2)     Status: None   Collection Time: 01/31/15  5:30 PM  Result Value Ref Range Status   Specimen Description BLOOD RIGHT HAND  Final   Special Requests BOTTLES DRAWN AEROBIC ONLY 3CC  Final   Culture   Final    NO GROWTH 5 DAYS Performed at [facility]    Report Status 02/06/2015 FINAL  Final  Culture, routine-abscess     Status: None   Collection Time: 02/01/15 10:55 AM  Result Value Ref Range Status   Specimen Description ABSCESS SPLEEN  Final   Special Requests NONE  Final   Gram Stain   Final      FEW WBC PRESENT,BOTH PMN AND MONONUCLEAR NO SQUAMOUS EPITHELIAL CELLS SEEN MODERATE GRAM POSITIVE RODS RARE YEAST Performed at [facility]    Culture   Final    FEW DIPHTHEROIDS(CORYNEBACTERIUM SPECIES) Note: Standardized susceptibility testing for this organism is not available. Performed at [facility]    Report Status 02/04/2015 FINAL  Final  Anaerobic culture     Status: None   Collection Time: 02/01/15 10:55 AM  Result Value Ref Range Status   Specimen Description ABSCESS SPLEEN  Final   Special Requests NONE  Final   Gram Stain   Final    FEW WBC PRESENT,BOTH PMN AND MONONUCLEAR NO SQUAMOUS EPITHELIAL CELLS SEEN MODERATE GRAM POSITIVE RODS RARE YEAST Performed at [facility]    Culture   Final    NO ANAEROBES ISOLATED Performed at [facility]    Report Status 02/06/2015 FINAL  Final  Fungus culture w smear     Status: None (Preliminary result)   Collection Time: 02/01/15 10:55 AM  Result Value Ref Range Status   Specimen Description ABSCESS SPLEEN  Final   Special Requests NONE  Final   Fungal Smear   Final    NO YEAST OR FUNGAL ELEMENTS SEEN Performed at [facility]    Culture   Final    CULTURE IN PROGRESS FOR FOUR WEEKS Performed at [facility]    Report Status PENDING  Incomplete  Body fluid culture     Status: None   Collection Time: 02/04/15 10:20 AM  Result Value Ref Range Status   Specimen Description PLEURAL LEFT FLUID  Final   Special Requests NONE  Final   Gram Stain   Final    FEW WBC PRESENT, PREDOMINANTLY PMN NO ORGANISMS SEEN Performed at [facility]    Culture   Final    NO GROWTH 3 DAYS Performed at [facility]    Report Status 02/07/2015 FINAL  Final  Surgical pcr screen     Status: Abnormal   Collection Time: 02/06/15 12:21 PM  Result Value Ref Range Status   MRSA, PCR NEGATIVE NEGATIVE Final   Staphylococcus aureus POSITIVE (A) NEGATIVE Final    Comment:  The Xpert SA Assay (FDA approved for NASAL specimens in patients over 42 years of age), is one component of a comprehensive surveillance program.  Test performance has been validated by [facility] for patients greater than or equal to 55 year old. It is not intended to diagnose infection nor to guide or monitor treatment.   Culture, respiratory (NON-Expectorated)     Status: None (Preliminary result)   Collection Time: 02/06/15  2:08 PM  Result Value Ref Range Status   Specimen Description BRONCHIAL WASHINGS  Final   Special Requests NONE  Final   Gram Stain   Final    RARE WBC PRESENT, PREDOMINANTLY PMN NO SQUAMOUS EPITHELIAL CELLS SEEN NO ORGANISMS SEEN Performed at [facility]    Culture   Final    NO GROWTH 1 DAY Performed at [facility]    Report Status PENDING  Incomplete  Anaerobic culture     Status: None (Preliminary result)   Collection Time: 02/06/15  3:25 PM  Result Value Ref Range Status   Specimen Description TISSUE LEFT LUNG  Final   Special Requests PATIENT ON FOLLOWING ZOSYN  Final   Gram Stain   Final    RARE WBC PRESENT,BOTH PMN AND MONONUCLEAR NO SQUAMOUS EPITHELIAL CELLS SEEN NO ORGANISMS SEEN Performed at [facility]    Culture   Final    NO ANAEROBES ISOLATED; CULTURE IN PROGRESS FOR 5 DAYS Performed at [facility]    Report Status PENDING  Incomplete  Tissue culture     Status: None (Preliminary result)   Collection Time: 02/06/15  3:25 PM  Result Value Ref Range Status   Specimen Description TISSUE LEFT LUNG  Final   Special Requests PATIENT ON FOLLOWING ZOSYN  Final   Gram Stain   Final    RARE WBC PRESENT,BOTH PMN AND MONONUCLEAR NO SQUAMOUS EPITHELIAL CELLS SEEN NO ORGANISMS SEEN Performed at [facility]    Culture   Final    NO GROWTH 1 DAY Performed at [facility]    Report Status PENDING  Incomplete  Anaerobic culture     Status: None (Preliminary result)    Collection Time: 02/06/15  3:28 PM  Result Value Ref Range Status   Specimen Description ABSCESS LEFT LUNG  Final   Special Requests PATIENT ON FOLLOWING ZOSYN  Final   Gram Stain   Final    NO WBC SEEN NO SQUAMOUS EPITHELIAL CELLS SEEN NO ORGANISMS SEEN Performed at [facility]    Culture   Final    NO ANAEROBES ISOLATED; CULTURE IN PROGRESS FOR 5 DAYS Performed at [facility]    Report Status PENDING  Incomplete  Culture, routine-abscess     Status: None (Preliminary result)   Collection Time: 02/06/15  3:28 PM  Result Value Ref Range Status   Specimen Description ABSCESS LEFT LUNG  Final   Special Requests PATIENT ON FOLLOWING ZOSYN  Final   Gram Stain   Final    NO WBC SEEN NO SQUAMOUS EPITHELIAL CELLS SEEN NO ORGANISMS SEEN Performed at [facility]    Culture   Final    NO GROWTH 1 DAY Performed at [facility]    Report Status PENDING  Incomplete    Assessment: She is improving on therapy for splenic abscess, left pleural empyema and possible underlying pneumonia. All recent sputum and pleural fluid cultures are negative. Splenic abscess cultures showed gram-positive rods and yeast on stain and grew only diphtheroids.  I will narrow piperacillin tazobactam to ampicillin sulbactam and continue fluconazole.  Plan: 1. Change piperacillin tazobactam to ampicillin sulbactam 2. Continue fluconazole  Michel Bickers, MD [facility] [facility] [phone] pager   [phone] cell 02/08/2015, 5:28 PM

## 2015-02-08 NOTE — Progress Notes (Signed)
Report called to Rn on 2west, pt transferring to 2w25. Pt stable at time of transport all belonging sent with patient at time of transport

## 2015-02-08 NOTE — Progress Notes (Signed)
  Progress Note    02/08/2015 9:19 AM 2 Days Post-Op  Subjective:  "feeling much better"; Nasal cannula making nose bleed"  Afebrile VSS  Filed Vitals:   02/08/15 0811  BP:   Pulse:   Temp: 97.7 F (36.5 C)  Resp:     Physical Exam: Lungs:  Non labored; -air leak   CBC    Component Value Date/Time   WBC 10.9* 02/08/2015 0500   WBC 6.3 06/11/2012 1012   RBC 3.32* 02/08/2015 0500   RBC 4.88 06/11/2012 1012   RBC 4.54 09/18/2011 0919   HGB 9.7* 02/08/2015 0500   HGB 15.6 06/11/2012 1012   HCT 30.5* 02/08/2015 0500   HCT 44.0 06/11/2012 1012   PLT 260 02/08/2015 0500   PLT 108* 06/11/2012 1012   MCV 91.9 02/08/2015 0500   MCV 90 06/11/2012 1012   MCH 29.2 02/08/2015 0500   MCH 32.0 06/11/2012 1012   MCHC 31.8 02/08/2015 0500   MCHC 35.5 06/11/2012 1012   RDW 14.2 02/08/2015 0500   RDW 12.5 06/11/2012 1012   LYMPHSABS 2.0 12/29/2014 1641   LYMPHSABS 2.3 06/11/2012 1012   MONOABS 1.1* 12/29/2014 1641   EOSABS 0.0 12/29/2014 1641   EOSABS 0.1 06/11/2012 1012   BASOSABS 0.0 12/29/2014 1641   BASOSABS 0.0 06/11/2012 1012    BMET    Component Value Date/Time   NA 137 02/08/2015 0500   K 3.7 02/08/2015 0500   CL 100 02/08/2015 0500   CO2 29 02/08/2015 0500   GLUCOSE 120* 02/08/2015 0500   BUN <5* 02/08/2015 0500   CREATININE 0.89 02/08/2015 0500   CREATININE 0.83 03/31/2014 1044   CALCIUM 8.2* 02/08/2015 0500   GFRNONAA 75* 02/08/2015 0500   GFRAA 87* 02/08/2015 0500    INR    Component Value Date/Time   INR 1.31 02/06/2015 0309     Intake/Output Summary (Last 24 hours) at 02/08/15 0919 Last data filed at 02/08/15 0800  Gross per 24 hour  Intake   1710 ml  Output   2290 ml  Net   -580 ml     Assessment:  49 y.o. female is s/p:  . Ultrasound-guided access, right femoral artery 2. Abdominal aortogram 3. Coil embolization, splenic artery 4. Third order catheterization 12/26/14 and left VATS with drainage of empyema   2 Days  Post-Op  Plan: -pt continues to feel better -per ID, continue current ABx regimen -CT to water seal today per CT surgery -will d/c PCA and start oral pain medication -[place] drying nares-may use neosporin on a cotton tip applicator to apply to nares -still no BM with dulcolax supp-will try Milk of Mag today-may need Mag Citrate, but pt wants to wait before we try this -DVT prophylaxis:  Lovenox   Leontine Locket, PA-C [facility] [phone] 02/08/2015 9:19 AM

## 2015-02-08 NOTE — Progress Notes (Signed)
Wasted 80ml pca Fent. Witnessed by second RN Lyla Son, flushed down sink

## 2015-02-08 NOTE — Progress Notes (Signed)
Pt ambulated in hall with rolling walker 350 ft without any complaints. She only required assistance with IV pole and chest tube system.

## 2015-02-08 NOTE — Progress Notes (Signed)
ANTIBIOTIC CONSULT NOTE Pharmacy Consult for unasyn Indication: left pleural empyema and possible underlying pneumonia, splenic abscess  No Known Allergies  Labs:  Recent Labs  02/06/15 0309 02/07/15 0531 02/08/15 0500  WBC 22.2* 14.5* 10.9*  HGB 10.8* 9.4* 9.7*  PLT 243 223 260  CREATININE 1.17* 0.94 0.89     Medications:  Anti-infectives    Start     Dose/Rate Route Frequency Ordered Stop   02/08/15 2200  Ampicillin-Sulbactam (UNASYN) 3 g in sodium chloride 0.9 % 100 mL IVPB     3 g 100 mL/hr over 60 Minutes Intravenous Every 6 hours 02/08/15 1738     02/06/15 1000  fluconazole (DIFLUCAN) tablet 200 mg     200 mg Oral Daily 02/05/15 1125     02/05/15 1130  fluconazole (DIFLUCAN) tablet 400 mg     400 mg Oral Daily 02/05/15 1125 02/05/15 1544   02/05/15 0200  vancomycin (VANCOCIN) IVPB 1000 mg/200 mL premix  Status:  Discontinued     1,000 mg 200 mL/hr over 60 Minutes Intravenous Every 12 hours 02/04/15 1254 02/05/15 1125   02/04/15 1400  piperacillin-tazobactam (ZOSYN) IVPB 3.375 g  Status:  Discontinued     3.375 g 12.5 mL/hr over 240 Minutes Intravenous 3 times per day 02/04/15 1254 02/08/15 1733   02/04/15 1400  vancomycin (VANCOCIN) 1,500 mg in sodium chloride 0.9 % 500 mL IVPB     1,500 mg 250 mL/hr over 120 Minutes Intravenous  Once 02/04/15 1254 02/04/15 1802   02/04/15 1000  ciprofloxacin (CIPRO) tablet 500 mg  Status:  Discontinued     500 mg Oral 2 times daily 02/04/15 0836 02/04/15 1251   02/02/15 0000  amoxicillin-clavulanate (AUGMENTIN) 875-125 MG per tablet     1 tablet Oral 2 times daily 02/02/15 0804     02/01/15 1800  piperacillin-tazobactam (ZOSYN) IVPB 3.375 g  Status:  Discontinued     3.375 g 12.5 mL/hr over 240 Minutes Intravenous 3 times per day 02/01/15 1724 02/04/15 1238     Assessment: Pt was initially started on abx for possible intra-abdominal infection s/p coiling embolization of splenic aneurysm.  Pt then had pleural fluid seen on  ultrasound, pt underwent thoracentesis and now will continue on abx for pneumonia/empyema.   Today is zosyn day #8, now s/p VATS, no fevers overnight, wbc down to 10.9. Zosyn now narrowed to unasyn by ID. Renal function normal.    3/27 pleural fluid - ngtd 3/23 blood x 2 - ngtd 3/24 splenic abscess - few dipheroids 3/24 abscess: fungal - ngtd 3/24 abscess: anaerobic - ngtd 3/24 fungal cx: ngtd  3/27 vanc >3/28 3/24 zosyn >3/31 3/27 cipro x1 3/31 unasyn>> Goal of Therapy:  Appropriate dosing  Plan:  D/c zosyn Unasyn 3g IV q6 hours Continue to follow cultures, WBC, fever trend, Scr trend  Thank you. Erin Hearing PharmD., BCPS Clinical Pharmacist Pager [phone] 02/08/2015 5:40 PM

## 2015-02-08 NOTE — Progress Notes (Signed)
Pt ambulated 3 times around PTCU circle with rolling walker and family. She tolerated very well.

## 2015-02-08 NOTE — Progress Notes (Signed)
Patient ID: Peggy Thomas, female   DOB: 02/23/1966, 49 y.o.   MRN: [id number] TCTS DAILY ICU PROGRESS NOTE                   [address]            [place] [address]          [phone]   2 Days Post-Op Procedure(s) (LRB): VIDEO BRONCHOSCOPY (N/A) LEFT VIDEO ASSISTED THORACOSCOPY (VATS)/DRAINAGE OF EMPYEMA (Left)  Total Length of Stay:  LOS: 8 days   Subjective: Feels better, breathing better  Objective: Vital signs in last 24 hours: Temp:  [97.4 F (36.3 C)-98.7 F (37.1 C)] 97.7 F (36.5 C) (03/31 0811) Pulse Rate:  [61-139] 61 (03/31 0700) Cardiac Rhythm:  [-] Normal sinus rhythm (03/31 0730) Resp:  [11-23] 15 (03/31 0726) BP: (87-111)/(50-87) 97/56 mmHg (03/31 0700) SpO2:  [84 %-98 %] 96 % (03/31 0726) Arterial Line BP: (93-103)/(58) 93/58 mmHg (03/30 1000) Weight:  [199 lb 11.8 oz (90.6 kg)] 199 lb 11.8 oz (90.6 kg) (03/31 0400)  Filed Weights   01/31/15 1616 02/08/15 0400  Weight: 190 lb 14.4 oz (86.592 kg) 199 lb 11.8 oz (90.6 kg)    Weight change:    Hemodynamic parameters for last 24 hours:    Intake/Output from previous day: 03/30 0701 - 03/31 0700 In: 1805 [P.O.:480; I.V.:1175; IV Piggyback:150] Out: 2400 [Urine:2270; Chest Tube:130]  Intake/Output this shift:    Current Meds: Scheduled Meds: . acetaminophen  1,000 mg Oral 4 times per day   Or  . acetaminophen (TYLENOL) oral liquid 160 mg/5 mL  1,000 mg Oral 4 times per day  . bisacodyl  10 mg Oral Daily  . enoxaparin (LOVENOX) injection  30 mg Subcutaneous Q24H  . fentaNYL   Intravenous 6 times per day  . fluconazole  200 mg Oral Daily  . levothyroxine  75 mcg Oral QAC breakfast  . pantoprazole  40 mg Oral Daily  . piperacillin-tazobactam (ZOSYN)  IV  3.375 g Intravenous 3 times per day  . senna-docusate  1 tablet Oral QHS   Continuous Infusions: . dextrose 5 % and 0.9% NaCl 50 mL/hr at 02/07/15 0830   PRN Meds:.albuterol, alum & mag hydroxide-simeth, diphenhydrAMINE  **OR** diphenhydrAMINE, guaiFENesin-dextromethorphan, hydrALAZINE, naloxone **AND** sodium chloride, ondansetron (ZOFRAN) IV, oxyCODONE, phenol, potassium chloride, traMADol  General appearance: alert and cooperative Neurologic: intact Heart: regular rate and rhythm, S1, S2 normal, no murmur, click, rub or gallop Lungs: diminished breath sounds LLL Abdomen: soft, non-tender; bowel sounds normal; no masses,  no organomegaly Extremities: extremities normal, atraumatic, no cyanosis or edema and Homans sign is negative, no sign of DVT Wound: intact, no air leak from chest tubes  Lab Results: CBC: Recent Labs  02/07/15 0531 02/08/15 0500  WBC 14.5* 10.9*  HGB 9.4* 9.7*  HCT 29.2* 30.5*  PLT 223 260   BMET:  Recent Labs  02/07/15 0531 02/08/15 0500  NA 133* 137  K 3.9 3.7  CL 97 100  CO2 33* 29  GLUCOSE 134* 120*  BUN 5* <5*  CREATININE 0.94 0.89  CALCIUM 8.0* 8.2*    PT/INR:  Recent Labs  02/06/15 0309  LABPROT 16.4*  INR 1.31   Radiology: Dg Chest Port 1 View  02/07/2015   CLINICAL DATA:  History of VATS for drainage of left empyema, followup  EXAM: PORTABLE CHEST - 1 VIEW  COMPARISON:  Portable chest x-ray of 02/06/2015  FINDINGS: Aeration of the left lung has improved  slightly. A left chest tube remains with volume loss and pleural and parenchymal opacity at the left lung base laterally. The right lung is clear. The right IJ central venous line is unchanged in position, and cardiomegaly is stable.  IMPRESSION: Improved aeration of the left lung.  Left chest tube remains.   Electronically Signed   By: Ivar Drape M.D.   On: 02/07/2015 08:02     Assessment/Plan: S/P Procedure(s) (LRB): VIDEO BRONCHOSCOPY (N/A) LEFT VIDEO ASSISTED THORACOSCOPY (VATS)/DRAINAGE OF EMPYEMA (Left) Mobilize Continue ABX therapy due to pre op infection infection Left chest drainage improved , but still does not ensure drainage of area below diaphram Ok from thoracic surgery view  to  transfer to 2w circle or 3s    Grace Isaac 02/08/2015 8:34 AM

## 2015-02-09 ENCOUNTER — Inpatient Hospital Stay (HOSPITAL_COMMUNITY): Payer: Managed Care, Other (non HMO)

## 2015-02-09 DIAGNOSIS — J869 Pyothorax without fistula: Secondary | ICD-10-CM | POA: Insufficient documentation

## 2015-02-09 DIAGNOSIS — Z09 Encounter for follow-up examination after completed treatment for conditions other than malignant neoplasm: Secondary | ICD-10-CM | POA: Insufficient documentation

## 2015-02-09 LAB — CULTURE, RESPIRATORY W GRAM STAIN

## 2015-02-09 LAB — CULTURE, RESPIRATORY: CULTURE: NO GROWTH

## 2015-02-09 MED ORDER — POTASSIUM CHLORIDE CRYS ER 20 MEQ PO TBCR
20.0000 meq | EXTENDED_RELEASE_TABLET | Freq: Once | ORAL | Status: AC
Start: 2015-02-09 — End: 2015-02-09
  Administered 2015-02-09: 20 meq via ORAL
  Filled 2015-02-09: qty 1

## 2015-02-09 MED ORDER — MAGNESIUM CITRATE PO SOLN
0.5000 | ORAL | Status: DC | PRN
  Administered 2015-02-09: 0.5 via ORAL
  Filled 2015-02-09 (×2): qty 296

## 2015-02-09 MED ORDER — FUROSEMIDE 10 MG/ML IJ SOLN
40.0000 mg | Freq: Once | INTRAMUSCULAR | Status: AC
  Administered 2015-02-09: 40 mg via INTRAVENOUS
  Filled 2015-02-09: qty 4

## 2015-02-09 NOTE — Progress Notes (Signed)
[facility]    Subjective: No new complaints, feeling better.   Antibiotics:  Anti-infectives    Start     Dose/Rate Route Frequency Ordered Stop   02/09/15 0000  Ampicillin-Sulbactam (UNASYN) 3 g in sodium chloride 0.9 % 100 mL IVPB     3 g 100 mL/hr over 60 Minutes Intravenous Every 6 hours 02/08/15 1743     02/08/15 2200  Ampicillin-Sulbactam (UNASYN) 3 g in sodium chloride 0.9 % 100 mL IVPB  Status:  Discontinued     3 g 100 mL/hr over 60 Minutes Intravenous Every 6 hours 02/08/15 1738 02/08/15 1743   02/06/15 1000  fluconazole (DIFLUCAN) tablet 200 mg     200 mg Oral Daily 02/05/15 1125     02/05/15 1130  fluconazole (DIFLUCAN) tablet 400 mg     400 mg Oral Daily 02/05/15 1125 02/05/15 1544   02/05/15 0200  vancomycin (VANCOCIN) IVPB 1000 mg/200 mL premix  Status:  Discontinued     1,000 mg 200 mL/hr over 60 Minutes Intravenous Every 12 hours 02/04/15 1254 02/05/15 1125   02/04/15 1400  piperacillin-tazobactam (ZOSYN) IVPB 3.375 g  Status:  Discontinued     3.375 g 12.5 mL/hr over 240 Minutes Intravenous 3 times per day 02/04/15 1254 02/08/15 1733   02/04/15 1400  vancomycin (VANCOCIN) 1,500 mg in sodium chloride 0.9 % 500 mL IVPB     1,500 mg 250 mL/hr over 120 Minutes Intravenous  Once 02/04/15 1254 02/04/15 1802   02/04/15 1000  ciprofloxacin (CIPRO) tablet 500 mg  Status:  Discontinued     500 mg Oral 2 times daily 02/04/15 0836 02/04/15 1251   02/02/15 0000  amoxicillin-clavulanate (AUGMENTIN) 875-125 MG per tablet     1 tablet Oral 2 times daily 02/02/15 0804     02/01/15 1800  piperacillin-tazobactam (ZOSYN) IVPB 3.375 g  Status:  Discontinued     3.375 g 12.5 mL/hr over 240 Minutes Intravenous 3 times per day 02/01/15 1724 02/04/15 1238      Medications: Scheduled Meds: . acetaminophen  1,000 mg Oral 4 times per day   Or  . acetaminophen (TYLENOL) oral liquid 160 mg/5 mL  1,000 mg Oral 4 times per day  . ampicillin-sulbactam  (UNASYN) IV  3 g Intravenous Q6H  . bisacodyl  10 mg Oral Daily  . enoxaparin (LOVENOX) injection  30 mg Subcutaneous Q24H  . fluconazole  200 mg Oral Daily  . levothyroxine  75 mcg Oral QAC breakfast  . magnesium hydroxide  5 mL Oral Daily  . pantoprazole  40 mg Oral Daily  . senna-docusate  1 tablet Oral QHS   Continuous Infusions: . dextrose 5 % and 0.9% NaCl 10 mL/hr at 02/09/15 0826   PRN Meds:.albuterol, alum & mag hydroxide-simeth, guaiFENesin-dextromethorphan, hydrALAZINE, magnesium citrate, oxyCODONE, phenol, potassium chloride, traMADol    Objective: Weight change:   Intake/Output Summary (Last 24 hours) at 02/09/15 1637 Last data filed at 02/09/15 1300  Gross per 24 hour  Intake    240 ml  Output   3635 ml  Net  -3395 ml   Blood pressure 116/69, pulse 79, temperature 97.8 F (36.6 C), temperature source Oral, resp. rate 18, height 5\' 2"  (1.575 m), weight 199 lb 11.8 oz (90.6 kg), last menstrual period 08/10/2010, SpO2 93 %. Temp:  [97.8 F (36.6 C)-98.7 F (37.1 C)] 97.8 F (36.6 C) (04/01 1319) Pulse Rate:  [70-90] 79 (04/01 1319) Resp:  [16-18] 18 (04/01 1319) BP: (101-116)/(56-69) 116/69  mmHg (04/01 1319) SpO2:  [93 %-95 %] 93 % (04/01 1319)  Physical Exam: General: Alert and awake, oriented x3, not in any acute distress. HEENT: EOMI CVS regular rate, normal r,  no murmur rubs or gallops Chest: no wheezing,  Abdomen: soft , nondistended,   Skin: chest tube in place  Neuro: nonfocal  CBC:  CBC Latest Ref Rng 02/08/2015 02/07/2015 02/06/2015  WBC 4.0 - 10.5 K/uL 10.9(H) 14.5(H) 22.2(H)  Hemoglobin 12.0 - 15.0 g/dL 9.7(L) 9.4(L) 10.8(L)  Hematocrit 36.0 - 46.0 % 30.5(L) 29.2(L) 33.1(L)  Platelets 150 - 400 K/uL 260 223 243       BMET  Recent Labs  02/07/15 0531 02/08/15 0500  NA 133* 137  K 3.9 3.7  CL 97 100  CO2 33* 29  GLUCOSE 134* 120*  BUN 5* <5*  CREATININE 0.94 0.89  CALCIUM 8.0* 8.2*     Liver Panel   Recent Labs   02/08/15 0500  PROT 5.9*  ALBUMIN 1.8*  AST 28  ALT 29  ALKPHOS 69  BILITOT 0.6       Sedimentation Rate No results for input(s): ESRSEDRATE in the last 72 hours. C-Reactive Protein No results for input(s): CRP in the last 72 hours.  Micro Results: Recent Results (from the past 720 hour(s))  Culture, blood (routine x 2)     Status: None   Collection Time: 01/31/15  5:25 PM  Result Value Ref Range Status   Specimen Description BLOOD LEFT HAND  Final   Special Requests BOTTLES DRAWN AEROBIC ONLY 8CC  Final   Culture   Final    NO GROWTH 5 DAYS Performed at [facility]    Report Status 02/06/2015 FINAL  Final  Culture, blood (routine x 2)     Status: None   Collection Time: 01/31/15  5:30 PM  Result Value Ref Range Status   Specimen Description BLOOD RIGHT HAND  Final   Special Requests BOTTLES DRAWN AEROBIC ONLY 3CC  Final   Culture   Final    NO GROWTH 5 DAYS Performed at [facility]    Report Status 02/06/2015 FINAL  Final  Culture, routine-abscess     Status: None   Collection Time: 02/01/15 10:55 AM  Result Value Ref Range Status   Specimen Description ABSCESS SPLEEN  Final   Special Requests NONE  Final   Gram Stain   Final    FEW WBC PRESENT,BOTH PMN AND MONONUCLEAR NO SQUAMOUS EPITHELIAL CELLS SEEN MODERATE GRAM POSITIVE RODS RARE YEAST Performed at [facility]    Culture   Final    FEW DIPHTHEROIDS(CORYNEBACTERIUM SPECIES) Note: Standardized susceptibility testing for this organism is not available. Performed at [facility]    Report Status 02/04/2015 FINAL  Final  Anaerobic culture     Status: None   Collection Time: 02/01/15 10:55 AM  Result Value Ref Range Status   Specimen Description ABSCESS SPLEEN  Final   Special Requests NONE  Final   Gram Stain   Final    FEW WBC PRESENT,BOTH PMN AND MONONUCLEAR NO SQUAMOUS EPITHELIAL CELLS SEEN MODERATE GRAM POSITIVE RODS RARE YEAST Performed at [facility]    Culture   Final    NO ANAEROBES ISOLATED Performed at [facility]    Report Status 02/06/2015 FINAL  Final  Fungus culture w smear     Status: None (Preliminary result)   Collection Time: 02/01/15 10:55 AM  Result Value Ref Range Status   Specimen  Description ABSCESS SPLEEN  Final   Special Requests NONE  Final   Fungal Smear   Final    NO YEAST OR FUNGAL ELEMENTS SEEN Performed at [facility]    Culture   Final    CULTURE IN PROGRESS FOR FOUR WEEKS Performed at [facility]    Report Status PENDING  Incomplete  Body fluid culture     Status: None   Collection Time: 02/04/15 10:20 AM  Result Value Ref Range Status   Specimen Description PLEURAL LEFT FLUID  Final   Special Requests NONE  Final   Gram Stain   Final    FEW WBC PRESENT, PREDOMINANTLY PMN NO ORGANISMS SEEN Performed at [facility]    Culture   Final    NO GROWTH 3 DAYS Performed at [facility]    Report Status 02/07/2015 FINAL  Final  Surgical pcr screen     Status: Abnormal   Collection Time: 02/06/15 12:21 PM  Result Value Ref Range Status   MRSA, PCR NEGATIVE NEGATIVE Final   Staphylococcus aureus POSITIVE (A) NEGATIVE Final    Comment:        The Xpert SA Assay (FDA approved for NASAL specimens in patients over 106 years of age), is one component of a comprehensive surveillance program.  Test performance has been validated by [facility] for patients greater than or equal to 93 year old. It is not intended to diagnose infection nor to guide or monitor treatment.   Culture, respiratory (NON-Expectorated)     Status: None   Collection Time: 02/06/15  2:08 PM  Result Value Ref Range Status   Specimen Description BRONCHIAL WASHINGS  Final   Special Requests NONE  Final   Gram Stain   Final    RARE WBC PRESENT, PREDOMINANTLY PMN NO SQUAMOUS EPITHELIAL CELLS SEEN NO ORGANISMS SEEN Performed at [facility]    Culture   Final     NO GROWTH 2 DAYS Performed at [facility]    Report Status 02/09/2015 FINAL  Final  Anaerobic culture     Status: None (Preliminary result)   Collection Time: 02/06/15  3:25 PM  Result Value Ref Range Status   Specimen Description TISSUE LEFT LUNG  Final   Special Requests PATIENT ON FOLLOWING ZOSYN  Final   Gram Stain   Final    RARE WBC PRESENT,BOTH PMN AND MONONUCLEAR NO SQUAMOUS EPITHELIAL CELLS SEEN NO ORGANISMS SEEN Performed at [facility]    Culture   Final    NO ANAEROBES ISOLATED; CULTURE IN PROGRESS FOR 5 DAYS Performed at [facility]    Report Status PENDING  Incomplete  Tissue culture     Status: None (Preliminary result)   Collection Time: 02/06/15  3:25 PM  Result Value Ref Range Status   Specimen Description TISSUE LEFT LUNG  Final   Special Requests PATIENT ON FOLLOWING ZOSYN  Final   Gram Stain   Final    RARE WBC PRESENT,BOTH PMN AND MONONUCLEAR NO SQUAMOUS EPITHELIAL CELLS SEEN NO ORGANISMS SEEN Performed at [facility]    Culture   Final    NO GROWTH 2 DAYS Performed at [facility]    Report Status PENDING  Incomplete  Anaerobic culture     Status: None (Preliminary result)   Collection Time: 02/06/15  3:28 PM  Result Value Ref Range Status   Specimen Description ABSCESS LEFT LUNG  Final   Special Requests PATIENT ON FOLLOWING  ZOSYN  Final   Gram Stain   Final    NO WBC SEEN NO SQUAMOUS EPITHELIAL CELLS SEEN NO ORGANISMS SEEN Performed at [facility]    Culture   Final    NO ANAEROBES ISOLATED; CULTURE IN PROGRESS FOR 5 DAYS Performed at [facility]    Report Status PENDING  Incomplete  Culture, routine-abscess     Status: None (Preliminary result)   Collection Time: 02/06/15  3:28 PM  Result Value Ref Range Status   Specimen Description ABSCESS LEFT LUNG  Final   Special Requests PATIENT ON FOLLOWING ZOSYN  Final   Gram Stain   Final    NO WBC SEEN NO SQUAMOUS EPITHELIAL  CELLS SEEN NO ORGANISMS SEEN Performed at [facility]    Culture   Final    NO GROWTH 2 DAYS Performed at [facility]    Report Status PENDING  Incomplete    Studies/Results: Dg Chest Port 1 View  02/09/2015   CLINICAL DATA:  Empyema.  EXAM: PORTABLE CHEST - 1 VIEW  COMPARISON:  02/08/2015 .  FINDINGS: Right IJ line in stable position. Left chest tube in stable position. Mediastinum hilar structures stable. Heart size stable. Persistent mild left pleural thickening. No prominent pleural fluid collection. Persistent left lung infiltrate. No pneumothorax. No acute osseus abnormality .  IMPRESSION: 1. Left chest tube is in stable position. No pneumothorax. No prominent pleural fluid collection. Mild thickening of the left pleura noted. Right IJ line in stable position. 2. Persistent left lung infiltrate noted, no change .   Electronically Signed   By: Marcello Moores  Register   On: 02/09/2015 08:52   Dg Chest Port 1 View  02/08/2015   CLINICAL DATA:  Postoperative from thoracoscopy for empyema drainage 2 days ago  EXAM: PORTABLE CHEST - 1 VIEW  COMPARISON:  Portable chest x-ray of February 07, 2015  FINDINGS: The lungs are hypoinflated. The right lung is clear. On the left there is persistent increased density in the mid and lower lung with partial obscuration of the hemidiaphragm. There is no pneumothorax. Two left-sided chest tubes are unchanged in position. The cardiac silhouette is mildly enlarged. The right internal jugular venous catheter tip projects at the level of the cavoatrial junction and is stable.  IMPRESSION: Slight improved aeration of the left lung with persistent parenchymal consolidation and small amount of pleural fluid. There is no pneumothorax. The left-sided chest tubes are stable. The right lung is clear. Mild cardiomegaly without evidence of CHF.   Electronically Signed   By: David  Martinique   On: 02/08/2015 08:04      Assessment/Plan:  Principal Problem:   Splenic  abscess Active Problems:   Pleural empyema   Dyspnea   Pleural effusion   Pleural effusion on left   Empyema    Peggy Thomas is a 49 y.o. 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 She been on very broad-spectrum antibiotics but cultures had been unrevealing with the one from her splenic abscess on Gram stain showing moderate gram-positive rods and rare yeast. She's been narrowed to Unasyn which she is tolerating well at this point  #1 Splenic abscess and empyema:  --continue unasyn for now --I would favor protracted therapy with repeat CT in at  least a month to ensure that this splenic abscess has resolved. --I will consider switching to IV ceftriaxone and oral metronidazole and oral fluconazole versus oral fluconazole and oral Augmentin\  Dr. Megan Salon available on the weekend for questions.     LOS: 9 days   Alcide Evener 02/09/2015, 4:37 PM

## 2015-02-09 NOTE — Progress Notes (Addendum)
  Progress Note    02/09/2015 8:12 AM 3 Days Post-Op  Subjective:  Ready to go home; still no bowel movement  Afebrile VSS 95% RA  Filed Vitals:   02/09/15 0354  BP: 101/61  Pulse: 79  Temp: 98.6 F (37 C)  Resp: 18    Physical Exam: Abdomen:  Soft, NT/ND  CBC    Component Value Date/Time   WBC 10.9* 02/08/2015 0500   WBC 6.3 06/11/2012 1012   RBC 3.32* 02/08/2015 0500   RBC 4.88 06/11/2012 1012   RBC 4.54 09/18/2011 0919   HGB 9.7* 02/08/2015 0500   HGB 15.6 06/11/2012 1012   HCT 30.5* 02/08/2015 0500   HCT 44.0 06/11/2012 1012   PLT 260 02/08/2015 0500   PLT 108* 06/11/2012 1012   MCV 91.9 02/08/2015 0500   MCV 90 06/11/2012 1012   MCH 29.2 02/08/2015 0500   MCH 32.0 06/11/2012 1012   MCHC 31.8 02/08/2015 0500   MCHC 35.5 06/11/2012 1012   RDW 14.2 02/08/2015 0500   RDW 12.5 06/11/2012 1012   LYMPHSABS 2.0 12/29/2014 1641   LYMPHSABS 2.3 06/11/2012 1012   MONOABS 1.1* 12/29/2014 1641   EOSABS 0.0 12/29/2014 1641   EOSABS 0.1 06/11/2012 1012   BASOSABS 0.0 12/29/2014 1641   BASOSABS 0.0 06/11/2012 1012    BMET    Component Value Date/Time   NA 137 02/08/2015 0500   K 3.7 02/08/2015 0500   CL 100 02/08/2015 0500   CO2 29 02/08/2015 0500   GLUCOSE 120* 02/08/2015 0500   BUN <5* 02/08/2015 0500   CREATININE 0.89 02/08/2015 0500   CREATININE 0.83 03/31/2014 1044   CALCIUM 8.2* 02/08/2015 0500   GFRNONAA 75* 02/08/2015 0500   GFRAA 87* 02/08/2015 0500    INR    Component Value Date/Time   INR 1.31 02/06/2015 0309     Intake/Output Summary (Last 24 hours) at 02/09/15 0812 Last data filed at 02/09/15 0412  Gross per 24 hour  Intake    390 ml  Output   1360 ml  Net   -970 ml     Assessment:  49 y.o. female is s/p:  1. Ultrasound-guided access, right femoral artery 2. Abdominal aortogram 3. Coil embolization, splenic artery 4. Third order catheterization 12/26/14 and left VATS with drainage of empyema    3 Days  Post-Op  Plan: -pt continues to feel better -one chest tube being removed today -still no BM-she is hesitant to take Mag Citrate.  I told her I would order it and she can take it if she wants. -DVT prophylaxis:  Lovenox -leukocytosis improved yesterday -continue to encourage nutrition -ABx per ID-piperacillin changed to ampicillin and continue fluconazole  -weight up 9 pounds-CT surgery giving dose of IV lasix today   Leontine Locket, PA-C [facility] [phone] 02/09/2015 8:12 AM  Probable DC on Sunday if chest tubes are removed Return to see Dr. Trula Slade in 4 weeks

## 2015-02-09 NOTE — Progress Notes (Signed)
Pt ambulated 500 feet in hall with rolling walker for safety. She only requires assistance with her chest tube and IV pole. She kept a steady pace and denied any shortness of breath.

## 2015-02-09 NOTE — Progress Notes (Signed)
Pt ambulated approx 600 feet in hall with daughter without any problems.

## 2015-02-09 NOTE — Progress Notes (Signed)
Anterior chest tube dc'd per protocol. Vital signs stable. No air leak before or after. Pt respiratory status unchanged post removal. She tolerated the procedure very well.

## 2015-02-09 NOTE — Progress Notes (Addendum)
3 Days Post-Op Procedure(s) (LRB): VIDEO BRONCHOSCOPY (N/A) LEFT VIDEO ASSISTED THORACOSCOPY (VATS)/DRAINAGE OF EMPYEMA (Left)   Subjective:  Peggy Thomas wants to go home.  She also complains of swelling in her hands and lower extremities and requests lasix.  She also asked if her central line can be removed.  I explained to her that as long as she is receiving IV ABX she will require a central line.  Objective: Vital signs in last 24 hours: Temp:  [97.7 F (36.5 C)-98.7 F (37.1 C)] 98.6 F (37 C) (04/01 0354) Pulse Rate:  [72-90] 79 (04/01 0354) Cardiac Rhythm:  [-] Normal sinus rhythm (04/01 0751) Resp:  [16-20] 18 (04/01 0354) BP: (101-106)/(56-65) 101/61 mmHg (04/01 0354) SpO2:  [94 %-96 %] 95 % (04/01 0354)  Intake/Output from previous day: 03/31 0701 - 04/01 0700 In: 590 [P.O.:440; I.V.:150] Out: 1360 [Urine:1300; Chest Tube:60]  General appearance: alert, cooperative and no distress Heart: regular rate and rhythm Lungs: diminished breath sounds on left Abdomen: soft, non-tender; bowel sounds normal; no masses,  no organomegaly Extremities: edema 1-2+ Wound: clean and dry  Lab Results:  Recent Labs  02/07/15 0531 02/08/15 0500  WBC 14.5* 10.9*  HGB 9.4* 9.7*  HCT 29.2* 30.5*  PLT 223 260   BMET:  Recent Labs  02/07/15 0531 02/08/15 0500  NA 133* 137  K 3.9 3.7  CL 97 100  CO2 33* 29  GLUCOSE 134* 120*  BUN 5* <5*  CREATININE 0.94 0.89  CALCIUM 8.0* 8.2*    PT/INR: No results for input(s): LABPROT, INR in the last 72 hours. ABG    Component Value Date/Time   PHART 7.400 02/07/2015 0510   HCO3 30.6* 02/07/2015 0510   TCO2 32.2 02/07/2015 0510   O2SAT 96.5 02/07/2015 0510   CBG (last 3)  No results for input(s): GLUCAP in the last 72 hours.  Assessment/Plan: S/P Procedure(s) (LRB): VIDEO BRONCHOSCOPY (N/A) LEFT VIDEO ASSISTED THORACOSCOPY (VATS)/DRAINAGE OF EMPYEMA (Left)  1. Splenic Abscess- per Vascular 2. Left Sided Empyema- 0cc output  since 7pm last night will d/c one chest tube, CXR ordered 3. D/C IV Fluids, patient tolerating oral intake will place to [facility] 4. ID-OR cultures remain negative, continue ABX  5. Renal- weight is up about 9 lbs since admission will give a dose of IV Lasix today, supplement potassium 6. Dispo- patient stable, continue ABX, diurese   LOS: 9 days    BARRETT, ERIN 02/09/2015  cultures are negative so far from material removed from left chest . Follow up chest xray remove remaining ct in 1-2 days I have seen and examined Peggy Thomas and agree with the above assessment  and plan.  Grace Isaac MD Beeper [phone] Office [phone] 02/09/2015 5:17 PM

## 2015-02-09 NOTE — Progress Notes (Signed)
Subjective: Underwent VATS decortication and cleanout empyema 3/29. Improving daily, prod cough.  Objective: Vital signs in last 24 hours: Blood pressure 101/61, pulse 79, temperature 98.6 F (37 C), temperature source Oral, resp. rate 18, height 5\' 2"  (1.575 m), weight 90.6 kg (199 lb 11.8 oz), last menstrual period 08/10/2010, SpO2 95 %.  Intake/Output from previous day: 03/31 0701 - 04/01 0700 In: 36 [P.O.:440; I.V.:150] Out: 1360 [Urine:1300; Chest Tube:60]  Thoracentesis Labs 3/27 >>> WBC 3100 (92% neuts), LDH 1822.  Cultures negative  Physical Exam:  Pleasant adult female in nad Neck without LN or TMG Chest improved BS on L  Cor with RRR, no M/R/G abd soft, bs+, nontender LE without significant edema, no cyanosis Alert and oriented, moves all 4    Lab Results:  Recent Labs  02/07/15 0531 02/08/15 0500  WBC 14.5* 10.9*  HGB 9.4* 9.7*  HCT 29.2* 30.5*  PLT 223 260   BMET  Recent Labs  02/07/15 0531 02/08/15 0500  NA 133* 137  K 3.9 3.7  CL 97 100  CO2 33* 29  GLUCOSE 134* 120*  BUN 5* <5*  CREATININE 0.94 0.89  CALCIUM 8.0* 8.2*    Studies/Results: Dg Chest Port 1 View  02/09/2015   CLINICAL DATA:  Empyema.  EXAM: PORTABLE CHEST - 1 VIEW  COMPARISON:  02/08/2015 .  FINDINGS: Right IJ line in stable position. Left chest tube in stable position. Mediastinum hilar structures stable. Heart size stable. Persistent mild left pleural thickening. No prominent pleural fluid collection. Persistent left lung infiltrate. No pneumothorax. No acute osseus abnormality .  IMPRESSION: 1. Left chest tube is in stable position. No pneumothorax. No prominent pleural fluid collection. Mild thickening of the left pleura noted. Right IJ line in stable position. 2. Persistent left lung infiltrate noted, no change .   Electronically Signed   By: Marcello Moores  Register   On: 02/09/2015 08:52   Dg Chest Port 1 View  02/08/2015   CLINICAL DATA:  Postoperative from thoracoscopy for empyema  drainage 2 days ago  EXAM: PORTABLE CHEST - 1 VIEW  COMPARISON:  Portable chest x-ray of February 07, 2015  FINDINGS: The lungs are hypoinflated. The right lung is clear. On the left there is persistent increased density in the mid and lower lung with partial obscuration of the hemidiaphragm. There is no pneumothorax. Two left-sided chest tubes are unchanged in position. The cardiac silhouette is mildly enlarged. The right internal jugular venous catheter tip projects at the level of the cavoatrial junction and is stable.  IMPRESSION: Slight improved aeration of the left lung with persistent parenchymal consolidation and small amount of pleural fluid. There is no pneumothorax. The left-sided chest tubes are stable. The right lung is clear. Mild cardiomegaly without evidence of CHF.   Electronically Signed   By: David  Martinique   On: 02/08/2015 08:04    Assessment/Plan:  Acute hypoxic respiratory failure - Continue supplemental O2 as needed to maintain SpO2 > 92%. - anticipate improvement now that L lung atx resolving  L empyema, possibly related to recent splenic bx S/p L VATS decortication ABX per ID - Vanc / Zosyn started 3/27. - f/u cultures on pleural fluid post VATS  Severe left lung atx due to pleural fluid +/- HCAP. Anticipate improvement post-procedure - Continue nebs, IS, OOB. - Pain control.  Splenic infarct vs abscess s/p needle bx of same - will follow clinical status and likely will have a repeat CT abd at some point to help determine whether  abscess exists, next steps in rx  PCCM will chk again 02/12/15.  See prn this weekend.   Mariel Sleet Beeper  [phone]  Cell  [phone]  If no response or cell goes to voicemail, call beeper [phone]   02/09/2015, 9:52 AM [facility]

## 2015-02-10 ENCOUNTER — Inpatient Hospital Stay (HOSPITAL_COMMUNITY): Payer: Managed Care, Other (non HMO)

## 2015-02-10 LAB — TISSUE CULTURE: Culture: NO GROWTH

## 2015-02-10 LAB — CULTURE, ROUTINE-ABSCESS
Culture: NO GROWTH
Gram Stain: NONE SEEN

## 2015-02-10 NOTE — Progress Notes (Signed)
Patient ID: Peggy Thomas, female   DOB: September 09, 1966, 49 y.o.   MRN: [id number] Up in chair. Comfortable. Chest tube management per Dr.Owen Anxious for discharge.

## 2015-02-10 NOTE — Progress Notes (Signed)
dc'ed chest tube pt. tolerated well  

## 2015-02-10 NOTE — Progress Notes (Addendum)
      Peggy Thomas [address]       [place], [address]             [phone]      4 Days Post-Op Procedure(s) (LRB): VIDEO BRONCHOSCOPY (N/A) LEFT VIDEO ASSISTED THORACOSCOPY (VATS)/DRAINAGE OF EMPYEMA (Left)   Subjective:  Peggy Thomas is feeling better after chest tube removal yesterday.  She is hoping her final chest tube will be removed today.  She continues to complain of her central line stating its really bothersome and starting to itch.  She is ambulating independently.  Objective: Vital signs in last 24 hours: Temp:  [97.8 F (36.6 C)-99 F (37.2 C)] 99 F (37.2 C) (04/02 0512) Pulse Rate:  [70-90] 90 (04/02 0512) Cardiac Rhythm:  [-] Normal sinus rhythm (04/01 2015) Resp:  [16-18] 18 (04/02 0512) BP: (105-124)/(62-69) 124/67 mmHg (04/02 0512) SpO2:  [93 %-94 %] 94 % (04/02 0512)  Intake/Output from previous day: 04/01 0701 - 04/02 0700 In: 600 [P.O.:600] Out: 2805 [Urine:2775; Chest Tube:30]  General appearance: alert, cooperative and no distress Heart: regular rate and rhythm Lungs: diminished breath sounds on left Abdomen: soft, non-tender; bowel sounds normal; no masses,  no organomegaly Extremities: edema improved Wound: clean and dry  Lab Results:  Recent Labs  02/08/15 0500  WBC 10.9*  HGB 9.7*  HCT 30.5*  PLT 260   BMET:  Recent Labs  02/08/15 0500  NA 137  K 3.7  CL 100  CO2 29  GLUCOSE 120*  BUN <5*  CREATININE 0.89  CALCIUM 8.2*    PT/INR: No results for input(s): LABPROT, INR in the last 72 hours. ABG    Component Value Date/Time   PHART 7.400 02/07/2015 0510   HCO3 30.6* 02/07/2015 0510   TCO2 32.2 02/07/2015 0510   O2SAT 96.5 02/07/2015 0510   CBG (last 3)  No results for input(s): GLUCAP in the last 72 hours.  Assessment/Plan: S/P Procedure(s) (LRB): VIDEO BRONCHOSCOPY (N/A) LEFT VIDEO ASSISTED THORACOSCOPY (VATS)/DRAINAGE OF EMPYEMA (Left)  1. Splenic Abscess-ID following, + yeast, GPR on Unasyn, ID to make  decision regarding discharge medications 2. Left sided Empyema- 40 cc output in last 24 hours, CXR remains stable, OR cultures remain negative 3. Dispo- patient stable, minimal output from chest tube will discuss possible removal today with Dr. Roxy Manns, awaiting ID recs for discharge ABX   LOS: 10 days    Peggy Thomas, Peggy Thomas 02/10/2015  I have seen and examined the patient and agree with the assessment and plan as outlined.  Rexene Alberts 02/10/2015 12:00 PM

## 2015-02-11 ENCOUNTER — Inpatient Hospital Stay (HOSPITAL_COMMUNITY): Payer: Managed Care, Other (non HMO)

## 2015-02-11 LAB — ANAEROBIC CULTURE: Gram Stain: NONE SEEN

## 2015-02-11 LAB — HIV ANTIBODY (ROUTINE TESTING W REFLEX): HIV SCREEN 4TH GENERATION: NONREACTIVE

## 2015-02-11 MED ORDER — OXYCODONE HCL 5 MG PO TABS: 5.0000 mg | ORAL_TABLET | ORAL | Status: DC | PRN

## 2015-02-11 MED ORDER — FLUCONAZOLE 100 MG PO TABS: 100.0000 mg | ORAL_TABLET | Freq: Every day | ORAL | Status: DC

## 2015-02-11 MED ORDER — AMOXICILLIN-POT CLAVULANATE 875-125 MG PO TABS
1.0000 | ORAL_TABLET | Freq: Two times a day (BID) | ORAL | Status: DC
Start: 2015-02-11 — End: 2015-02-28

## 2015-02-11 MED ORDER — TRAMADOL HCL 50 MG PO TABS: 50.0000 mg | ORAL_TABLET | Freq: Four times a day (QID) | ORAL | Status: DC | PRN

## 2015-02-11 NOTE — Discharge Summary (Signed)
Physician Discharge Summary  Patient ID: Peggy Thomas MRN: [id number] DOB/AGE: [birth year] 49 y.o.  Admit date: 01/31/2015 Discharge date: 02/11/2015  Admission Diagnoses:  Patient Active Problem List   Diagnosis Date Noted  . Empyema lung   . S/P lung surgery, follow-up exam   . Empyema 02/06/2015  . Pleural empyema 02/05/2015  . Dyspnea   . Pleural effusion   . Pleural effusion on left   . Splenic abscess 01/31/2015  . Vaginal pain 12/03/2014  . Toe pain, right 04/03/2014  . Edema 04/03/2014  . Renal insufficiency 04/03/2014  . Elevated BP 04/03/2014  . Unspecified hypothyroidism 04/03/2014  . Splenic artery aneurysm 09/20/2013  . Acute bronchitis 07/21/2013  . Thrombocytopenia 05/29/2011  . Depression 04/30/2011  . Hypothyroidism 04/30/2011  . Tobacco abuse 04/30/2011  . Uterus, adenomyosis 04/30/2011  . Tendonitis   . Fatigue   . Hypocalcemia   . Overweight    Discharge Diagnoses:   Patient Active Problem List   Diagnosis Date Noted  . Empyema lung   . S/P lung surgery, follow-up exam   . Empyema 02/06/2015  . Pleural empyema 02/05/2015  . Dyspnea   . Pleural effusion   . Pleural effusion on left   . Splenic abscess 01/31/2015  . Vaginal pain 12/03/2014  . Toe pain, right 04/03/2014  . Edema 04/03/2014  . Renal insufficiency 04/03/2014  . Elevated BP 04/03/2014  . Unspecified hypothyroidism 04/03/2014  . Splenic artery aneurysm 09/20/2013  . Acute bronchitis 07/21/2013  . Thrombocytopenia 05/29/2011  . Depression 04/30/2011  . Hypothyroidism 04/30/2011  . Tobacco abuse 04/30/2011  . Uterus, adenomyosis 04/30/2011  . Tendonitis   . Fatigue   . Hypocalcemia   . Overweight    Discharged Condition: good  History of Present Illness:  Peggy Thomas is a 49 year white female who is S/P S/P Splenic artery embolization done on 12/26/2014. She initially did well however at her 1 month follow up visit, the patient admitted to not feeling well. She had been  suffering from low grade fevers with episodes of nausea, vomiting, and overall feeling of being ill. CT scan was obtained and showed a large fluid collection in the midportion of the spleen. It was felt she would require admission for further care. She was placed on Zosyn and IR consult was placed for possible drainage. This was performed and 20 cc of dark fluid was removed. The patient developed severe abdominal pain post procedure and CT of the Abdomen was obtained. This did not show evidence of significant abscess or acute bleeding from drainage site. Follow up CXR was obtained and showed a large left sided pleural effusion. Pulmonary consult was requested who recommended thoracentesis be performed. This was performed by IR and 100 cc of cloudy yellow fluid was removed. Pulmonology continued to follow the patient and recommended repeat CT scan to look for parapneumonic effusion vs. Empyema. This was done and confirmed the presence of a large left sided loculated pleural effusion.  TCTS consult was obtained and she was evaluated by Dr. Servando Snare.  He recommended patient undergo VATS drainage of the loculated left pleural effusion.   Hospital Course:   The patient did well during her hospitalization.  Fluid culture results from her splenic abscess revealed diphtheroids and yeast.  ID consult was obtained and there antibiotic regimen has been followed.  She was taken to the operating room on 02/06/2015.  She underwent Video Bronchoscopy and Left Vats with drainage of empyema.  She tolerated the procedure well  was extubated and taken to the SICU in stable condition.  The patient progressed nicely from surgery.  Her OR cultures have remained negative.  She has remained afebrile.  Infectious disease has transitioned patient to an oral regimen of Augmentin and Fluconazole.  Her chest tubes never exhibited an air leak.  They were weaned from suction and later removed without difficulty.  Her follow up CXR  showed improvement of empyema and no pneumothorax post chest tube removal.  Her pain is well controlled.  She is ambulating without difficulty.  She was given specific instructions that should she feel ill and develop fever to contact ID office immediately so we can ensure her splenic abscess is not redeveloping.  She is medically stable at this time and will be discharged 02/11/2015.  Consults: ID  Treatments: surgery:   1. IR Drainage of Splenic Abscess 2. Thoracentesis 3. Video Bronchoscopy 4. Left Video Assisted Thoracoscopy with Drainage of Empyema  Disposition: 01-Home or Self Care      Discharge Instructions    Activity as tolerated - No restrictions    Complete by:  As directed      Call MD for:  redness, tenderness, or signs of infection (pain, swelling, bleeding, redness, odor or green/yellow discharge around incision site)    Complete by:  As directed      Call MD for:  severe or increased pain, loss or decreased feeling  in affected limb(s)    Complete by:  As directed      Call MD for:  temperature >100.5    Complete by:  As directed      Discharge instructions    Complete by:  As directed   You may shower     Resume previous diet    Complete by:  As directed             Medication List    STOP taking these medications        HYDROcodone-acetaminophen 5-325 MG per tablet  Commonly known as:  NORCO/VICODIN     ondansetron 4 MG tablet  Commonly known as:  ZOFRAN      TAKE these medications        acetaminophen 500 MG tablet  Commonly known as:  TYLENOL  Take 1,000 mg by mouth every 6 (six) hours as needed for moderate pain.     amoxicillin-clavulanate 875-125 MG per tablet  Commonly known as:  AUGMENTIN  Take 1 tablet by mouth 2 (two) times daily.     b complex vitamins tablet  Take 1 tablet by mouth as needed (for energy).     fluconazole 100 MG tablet  Commonly known as:  DIFLUCAN  Take 1 tablet (100 mg total) by mouth daily.     furosemide 20  MG tablet  Commonly known as:  LASIX  Take 1 tablet (20 mg total) by mouth daily.     ibuprofen 600 MG tablet  Commonly known as:  ADVIL,MOTRIN  Take 1 tablet (600 mg total) by mouth every 8 (eight) hours as needed for headache, mild pain or moderate pain.     levothyroxine 75 MCG tablet  Commonly known as:  SYNTHROID, LEVOTHROID  TAKE 1 TABLET (75 MCG TOTAL) BY MOUTH DAILY BEFORE BREAKFAST.     oxyCODONE 5 MG immediate release tablet  Commonly known as:  Oxy IR/ROXICODONE  Take 1-2 tablets (5-10 mg total) by mouth every 4 (four) hours as needed for severe pain.     traMADol 50 MG tablet  Commonly known as:  ULTRAM  Take 1-2 tablets (50-100 mg total) by mouth every 6 (six) hours as needed (mild pain).       Follow-up Information    Follow up with Eldridge Abrahams, MD On 02/19/2015.   Specialty:  Vascular Surgery   Why:  appointment is at 10:30   Contact information:   [place] [address] [phone]       Follow up with Grace Isaac, MD In 2 weeks.   Specialty:  Cardiothoracic Surgery   Why:  Office will contact you with appointment   Contact information:   [address] [place] [address] [phone]       Follow up with [facility] In 2 weeks.   Why:  Please get CXR 30 min prior to appointment with Dr. Evaristo Bury information:   [facility]       Follow up with Michel Bickers, MD In 2 weeks.   Specialty:  Infectious Diseases   Why:  Office will contact you with appointment   Contact information:   [address] [place] [address] [phone]       Signed: Ellwood Handler 02/11/2015, 1:01 PM

## 2015-02-11 NOTE — Progress Notes (Addendum)
      Peggy Thomas       [place] [address]             [phone]      5 Days Post-Op Procedure(s) (LRB): VIDEO BRONCHOSCOPY (N/A) LEFT VIDEO ASSISTED THORACOSCOPY (VATS)/DRAINAGE OF EMPYEMA (Left)   Subjective:  Peggy Thomas has no complaints this morning.  She is again very anxious to be discharged.  She is ambulating without difficulty.  She has moved her bowels.  Objective: Vital signs in last 24 hours: Temp:  [98.3 F (36.8 C)-98.5 F (36.9 C)] 98.3 F (36.8 C) (04/03 0612) Pulse Rate:  [77-81] 81 (04/03 0612) Cardiac Rhythm:  [-] Normal sinus rhythm (04/02 1957) Resp:  [18] 18 (04/03 0612) BP: (118-121)/(70-73) 121/70 mmHg (04/03 0612) SpO2:  [95 %-96 %] 96 % (04/03 0612)  Intake/Output from previous day: 04/02 0701 - 04/03 0700 In: 720 [P.O.:720] Out: -  Intake/Output this shift: Total I/O In: 120 [P.O.:120] Out: -   General appearance: alert, cooperative and no distress Heart: regular rate and rhythm Lungs: diminished breath sounds left base Abdomen: soft, non-tender; bowel sounds normal; no masses,  no organomegaly Wound: clean and dry  Lab Results: No results for input(s): WBC, HGB, HCT, PLT in the last 72 hours. BMET: No results for input(s): NA, K, CL, CO2, GLUCOSE, BUN, CREATININE, CALCIUM in the last 72 hours.  PT/INR: No results for input(s): LABPROT, INR in the last 72 hours. ABG    Component Value Date/Time   PHART 7.400 02/07/2015 0510   HCO3 30.6* 02/07/2015 0510   TCO2 32.2 02/07/2015 0510   O2SAT 96.5 02/07/2015 0510   CBG (last 3)  No results for input(s): GLUCAP in the last 72 hours.  Assessment/Plan: S/P Procedure(s) (LRB): VIDEO BRONCHOSCOPY (N/A) LEFT VIDEO ASSISTED THORACOSCOPY (VATS)/DRAINAGE OF EMPYEMA (Left)  1. Splenic Abscess- ID following, spoke with ID who recommended Oral Fluconazole and Augmentin for discharge.  They will follow up with her in 2 weeks in the clinic 2. Left sided Empyema- OR cultures  remain negative, all chest drains out, CXR somewhat improved, no pneumothorax, ?air fluid level 3. Dispo- patient stable, oral ABX for discharge per ID, hopefully d/c home today   LOS: 11 days    BARRETT, ERIN 02/11/2015  I have seen and examined the patient and agree with the assessment and plan as outlined.  CXR looks okay.  Rexene Alberts 02/11/2015 11:14 AM

## 2015-02-11 NOTE — Discharge Instructions (Signed)
1. No Driving While using Narcotic Pain Medication 2. May shower, wash incisions with soap and water daily, pat dry 3. Watch for fevers >100, contact office immediately 4. Avoid Strenuous Physical Activity for 2 weeks

## 2015-02-11 NOTE — Progress Notes (Signed)
Discharged to home with family office visits in place teaching done  

## 2015-02-12 NOTE — Op Note (Signed)
NAMESHELA, [name] NO.:  [id number]  MEDICAL RECORD NO.:  [id number]  LOCATION:                                FACILITY:  [facility]  PHYSICIAN:  Lanelle Bal, MD    DATE OF BIRTH:  16-Jun-1966  DATE OF PROCEDURE:  02/06/2015 DATE OF DISCHARGE:  02/11/2015                              OPERATIVE REPORT   PREOPERATIVE DIAGNOSIS:  Left chest empyema.  POSTOPERATIVE DIAGNOSIS:  Left chest empyema.  SURGICAL PROCEDURE:  Bronchoscopy, left video-assisted thoracoscopy, mini thoracotomy, drainage of empyema, and decortication.  SURGEON:  Lanelle Bal, MD.  FIRST ASSISTANT:  John Giovanni, PA.  BRIEF HISTORY:  The patient is a 49 year old female, who in February underwent embolization and thrombosis of a splenic artery aneurysm, subsequently she was readmitted the week prior to surgery because of increasing pain and discomfort in her abdomen.  A CT scan showed various small left effusion and a complex infarction of the spleen with potential  abscess.  The patient then underwent a CT-guided needle aspiration of the spleen but likely this was performed through the pleural space.  Subsequent to this, patient grew diphtheroids and yeast from the office, but following drainage, she became increasingly short of breath.  A followup chest x-ray and subsequent CT confirmed now a very large left pleural effusion, complex in nature with loculations, suggestive of an empyema.  Thoracic Surgery was then consulted and recommended drainage and decortication.  The patient agreed and signed informed consent.  DESCRIPTION OF PROCEDURE:  The patient was brought to the operating room with the left side previously marked.  She underwent general endotracheal anesthesia without incident and initially through the single-lumen endotracheal tube her fiberoptic scope was passed after appropriate time-out was performed.  There was no evidence of endobronchial lesions.  The scope was  removed, and the patient turned in lateral decubitus position with left side up.  A small  incision port site was then created but this left [place] entering into a very complex and empyema space with  significant loculations.  We enlarged this incision to a mini thoracotomy and worked directly removing a  significant amount of congealed complex fusion.  Aerobic and anaerobic cultures were obtained.  Additional port sites were created superiorly both anteriorly and posteriorly and through the small incision in the port sites we were able to clean out the left chest and debride early the empyema cavity. With this, we drained as much as possible.  We then left 2 Blake drains in place, one anterior and one posteriorly.  The lung did reinflate reasonably well and the incision was then closed with  interrupted 0 pericostal suture around the rib with the lower rib drill was used.  The muscle layers were closed with interrupted 0 Vicryl, running 2-0 Vicryl in subcutaneous tissue, running 3-0 subcuticular stitch in skin edges.  Dermabond was applied to the wound. The patient was awakened and extubated in the operating room and transferred to the recovery room for postoperative  care.  Blood loss was approximately 200-300 mL.  The patient tolerated the procedure without obvious  complication.     Lanelle Bal, MD     EG/MEDQ  D:  02/11/2015  T:  02/12/2015  Job:  [id number]

## 2015-02-12 NOTE — Telephone Encounter (Signed)
I spoke with Peggy Thomas- she was under the impression that she did not have to drink contrast. Looking back, her order was changed to without IV contrast due to her elevated BUN/Creat- per Zigmund Daniel, pt can not have IV. I explained this to Peggy Thomas- she will pick up contrast by Wednesday. dpm

## 2015-02-15 ENCOUNTER — Other Ambulatory Visit: Payer: Self-pay | Admitting: Surgery

## 2015-02-15 DIAGNOSIS — Z48812 Encounter for surgical aftercare following surgery on the circulatory system: Secondary | ICD-10-CM

## 2015-02-15 DIAGNOSIS — L0291 Cutaneous abscess, unspecified: Secondary | ICD-10-CM

## 2015-02-16 ENCOUNTER — Ambulatory Visit
Admit: 2015-02-16 | Discharge: 2015-02-16 | Disposition: A | Discharge status: Home / Self Care | Payer: Managed Care, Other (non HMO) | Attending: Surgery | Admitting: Surgery

## 2015-02-16 ENCOUNTER — Encounter: Payer: Self-pay | Admitting: Surgery

## 2015-02-16 DIAGNOSIS — Z48812 Encounter for surgical aftercare following surgery on the circulatory system: Secondary | ICD-10-CM

## 2015-02-16 DIAGNOSIS — L0291 Cutaneous abscess, unspecified: Secondary | ICD-10-CM

## 2015-02-16 MED ORDER — IOPAMIDOL (ISOVUE-300) INJECTION 61%
100.0000 mL | Freq: Once | INTRAVENOUS | Status: AC | PRN
  Administered 2015-02-16: 100 mL via INTRAVENOUS

## 2015-02-19 ENCOUNTER — Encounter: Payer: Self-pay | Admitting: Surgery

## 2015-02-19 ENCOUNTER — Other Ambulatory Visit: Payer: Self-pay | Admitting: *Deleted

## 2015-02-19 ENCOUNTER — Ambulatory Visit (INDEPENDENT_AMBULATORY_CARE_PROVIDER_SITE_OTHER): Payer: Managed Care, Other (non HMO) | Admitting: Surgery

## 2015-02-19 VITALS — BP 112/76 | HR 82 | Ht 62.0 in | Wt 190.3 lb

## 2015-02-19 DIAGNOSIS — I728 Aneurysm of other specified arteries: Secondary | ICD-10-CM

## 2015-02-19 NOTE — Progress Notes (Signed)
Patient name: Peggy Thomas MRN: [id number] DOB: 02-21-1966 Sex: female     Chief Complaint  Patient presents with  . Routine Post Op    spleenic aspiration    HISTORY OF PRESENT ILLNESS: Patient is back today for follow-up. 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  She was vaccinated prior to her procedure.  She continued to have night sweats and low-grade fevers.  CT scan revealed a splenic collection.  This ultimately was aspirated.  Cultures from the aspirate grew out rare yeast and few diphtheroids (Corynebacterium species).  The patient developed a lung abscess following this which required a VATS.  Pulmonary cultures were negative.  She was ultimately discharged.  She was consulted on by infectious disease.  She was discharged home on ampicillin and Fluconazole.  She reports occasional night sweats but no fevers.  Her breathing has improved.  Past Medical History  Diagnosis Date  . Hypocalcemia   . Depression 04/30/2011  . Hypothyroidism 04/30/2011  . History of kidney stones   . Right ureteral stone   . Chronic idiopathic thrombocytopenia     MILD--   HEMATOLOGIST--  DR Marin Olp  . PONV (postoperative nausea and vomiting)   . 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 Cholecystectomy  2000  . Cesarean section  1996 and 1998  . Tubal ligation  1999  . Knee arthroscopy    . Robotic assisted total hysterectomy  10-15-2010    LAPAROSCOPIC /  LYSIS ADHESIONS  . Extracorporeal shock wave lithotripsy  2003  . Cystoscopy with retrograde pyelogram, ureteroscopy and stent placement Right 07/25/2013    Procedure: CYSTOSCOPY WITH RETROGRADE PYELOGRAM, URETEROSCOPY, STONE EXTRACTION AND POSSIBLE STENT PLACEMENT;  Surgeon: Ailene Rud, MD;  Location: [facility];  Service: Urology;  Laterality: Right;  . Abdominal hysterectomy    . Embolization N/A 12/26/2014    Procedure:  EMBOLIZATION;  Surgeon: Serafina Mitchell, MD;  Location: [facility] CATH LAB;  Service: Cardiovascular;  Laterality: N/A;  . Peripheral vascular catheterization  12/26/2014    Procedure: VISCERAL ANGIOGRAPHY;  Surgeon: Serafina Mitchell, MD;  Location[facility] CATH LAB;  Service: Cardiovascular;;  . Video bronchoscopy N/A 02/06/2015    Procedure: VIDEO BRONCHOSCOPY;  Surgeon: Grace Isaac, MD;  Location: [facility] OR;  Service: Thoracic;  Laterality: N/A;  . Video assisted thoracoscopy (vats)/empyema Left 02/06/2015    Procedure: LEFT VIDEO ASSISTED THORACOSCOPY (VATS)/DRAINAGE OF EMPYEMA;  Surgeon: Grace Isaac, MD;  Location: [facility];  Service: Thoracic;  Laterality: Left;  (L)VATS, DRAINAGE OF EMPYEMA    History   Social History  . Marital Status: Married    Spouse Name: N/A  . Number of Children: N/A  . Years of Education: N/A   Occupational History  . Not on file.   Social History Main Topics  . Smoking status: Current Every Day Smoker -- 1.00 packs/day for 30 years    Types: Cigarettes  . Smokeless tobacco: Never Used  . Alcohol Use: Yes     Comment: occasionally/socially  . Drug Use: No  . Sexual Activity:    Partners: Male    Birth Control/ Protection: Surgical     Comment: TLH/LOA   Other Topics Concern  . Not on file   Social History Narrative    Family History  Problem Relation Age of Onset  . Diabetes Mother     type 2  .  Asthma Mother   . Cancer Mother     skin cancer  . Cancer Father     bone  . Mental illness Sister     schizophrenia  . Alcohol abuse Maternal Grandmother   . Stroke Maternal Grandmother   . Aneurysm Maternal Grandmother     brain aneurysm  . Other Maternal Grandfather     black lung  . Alzheimer's disease Paternal Grandmother   . Stroke Maternal Aunt     Allergies as of 02/19/2015  . (No Known Allergies)    Current Outpatient Prescriptions on File Prior to Visit  Medication Sig Dispense Refill  . acetaminophen (TYLENOL) 500 MG tablet Take  1,000 mg by mouth every 6 (six) hours as needed for moderate pain.    Marland Kitchen amoxicillin-clavulanate (AUGMENTIN) 875-125 MG per tablet Take 1 tablet by mouth 2 (two) times daily. 60 tablet 0  . b complex vitamins tablet Take 1 tablet by mouth as needed (for energy).     . fluconazole (DIFLUCAN) 100 MG tablet Take 1 tablet (100 mg total) by mouth daily. 30 tablet 0  . furosemide (LASIX) 20 MG tablet Take 1 tablet (20 mg total) by mouth daily. (Patient taking differently: Take 20 mg by mouth daily as needed for fluid. ) 30 tablet 3  . ibuprofen (ADVIL,MOTRIN) 600 MG tablet Take 1 tablet (600 mg total) by mouth every 8 (eight) hours as needed for headache, mild pain or moderate pain. 40 tablet 2  . levothyroxine (SYNTHROID, LEVOTHROID) 75 MCG tablet TAKE 1 TABLET (75 MCG TOTAL) BY MOUTH DAILY BEFORE BREAKFAST. 30 tablet 2  . oxyCODONE (OXY IR/ROXICODONE) 5 MG immediate release tablet Take 1-2 tablets (5-10 mg total) by mouth every 4 (four) hours as needed for severe pain. 30 tablet 0  . traMADol (ULTRAM) 50 MG tablet Take 1-2 tablets (50-100 mg total) by mouth every 6 (six) hours as needed (mild pain). 30 tablet 0   No current facility-administered medications on file prior to visit.     REVIEW OF SYSTEMS: Please see history of present illness, otherwise all systems negative  PHYSICAL EXAMINATION:   Vital signs are  Filed Vitals:   02/19/15 1056  BP: 112/76  Pulse: 82  Height: 5\' 2"  (1.575 m)  Weight: 190 lb 4.8 oz (86.32 kg)  SpO2: 98%   Body mass index is 34.8 kg/(m^2). General: The patient appears their stated age. HEENT:  No gross abnormalities Pulmonary:  Non labored breathing Abdomen: Soft and non-tender Musculoskeletal: There are no major deformities. Neurologic: No focal weakness or paresthesias are detected, Skin: There are no ulcer or rashes noted. Psychiatric: The patient has normal affect.    Diagnostic Studies CT angiogram has been reviewed and shows the following: 7.3 x  4.2 x 5.2 cm rim enhancing fluid collection in the spleen, suspicious for splenic abscess or seroma, mildly decreased from most recent post drainage CT.  Small left hydropneumothorax, decreased from prior CT chest, with removal of interval chest tube/drain.  Assessment: Status post splenic artery aneurysm coiling Plan: The patient is improving following her hospital stay.  She continues to be on antibiotics.  She has an appointment to see Dr. Megan Salon with infectious disease in approximately 2 weeks.  She is also going to follow with Dr. Servando Snare in 2 weeks.  From my perspective she remained stable.  She still has a hypodense area within her spleen that will need continued follow-up.  I have scheduled her for CT scan in 3 months.  I did  discuss the possibility that she could still require a splenectomy, however I think that she is improving and hopefully with the area of concern getting smaller, this will resolve on its own.  Eldridge Abrahams, M.D. [facility] Office: [phone] Pager:  [phone]

## 2015-02-28 ENCOUNTER — Ambulatory Visit (INDEPENDENT_AMBULATORY_CARE_PROVIDER_SITE_OTHER): Payer: Managed Care, Other (non HMO) | Admitting: Infectious Disease

## 2015-02-28 ENCOUNTER — Encounter: Payer: Self-pay | Admitting: Infectious Disease

## 2015-02-28 VITALS — BP 111/78 | HR 80 | Temp 98.8°F | Wt 187.0 lb

## 2015-02-28 DIAGNOSIS — I728 Aneurysm of other specified arteries: Secondary | ICD-10-CM | POA: Diagnosis not present

## 2015-02-28 DIAGNOSIS — J869 Pyothorax without fistula: Secondary | ICD-10-CM

## 2015-02-28 DIAGNOSIS — D733 Abscess of spleen: Secondary | ICD-10-CM | POA: Diagnosis not present

## 2015-02-28 MED ORDER — FLUCONAZOLE 100 MG PO TABS
100.0000 mg | ORAL_TABLET | Freq: Every day | ORAL | Status: DC
Start: 2015-02-28 — End: 2015-03-07

## 2015-02-28 MED ORDER — AMOXICILLIN-POT CLAVULANATE 875-125 MG PO TABS: 1.0000 | ORAL_TABLET | Freq: Two times a day (BID) | ORAL | Status: DC

## 2015-02-28 NOTE — Progress Notes (Signed)
Subjective:    Patient ID: Peggy Thomas, female    DOB: 1965/12/24, 49 y.o.   MRN: [id number]  HPI  49 y.o. 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 She been on very broad-spectrum antibiotics but cultures had been unrevealing with the one from her splenic abscess on Gram stain showing moderate gram-positive rods and rare yeast. She's been narrowed to Unasyn + fluconazole. Since I last saw her she underwent surgery Dr. Servando Snare Bronchoscopy, left video-assisted thoracoscopy,mini thoracotomy, drainage of empyema, and decortication, he was also only discharged on oral Augmentin has had a repeat CT of the abdomen and pelvis which shows that her splenic abscess is only slightly diminished in size.  He is still having night sweats every night though they have diminished in intensity compared to prior to her hospitalization when she was found to have her splenic abscess and empyema.   Review of Systems  Constitutional: Positive for diaphoresis and fatigue. Negative for fever, chills, activity change, appetite change and unexpected weight change.  HENT: Negative for congestion, rhinorrhea, sinus pressure, sneezing, sore throat and trouble swallowing.   Eyes: Negative for photophobia and visual disturbance.  Respiratory: Negative for cough, chest tightness, shortness of breath, wheezing and stridor.   Cardiovascular: Negative for chest pain, palpitations and leg swelling.  Gastrointestinal: Negative for nausea, vomiting, abdominal pain, diarrhea, constipation, blood in stool, abdominal distention and anal bleeding.  Genitourinary: Positive for flank pain. Negative for dysuria, hematuria and difficulty urinating.  Musculoskeletal: Negative for  myalgias, back pain, joint swelling, arthralgias and gait problem.  Skin: Negative for color change, pallor, rash and wound.  Neurological: Negative for dizziness, tremors, weakness and light-headedness.  Hematological: Negative for adenopathy. Does not bruise/bleed easily.  Psychiatric/Behavioral: Negative for behavioral problems, confusion, sleep disturbance, dysphoric mood, decreased concentration and agitation.       Objective:   Physical Exam  Constitutional: She is oriented to person, place, and time. She appears well-developed and well-nourished. No distress.  HENT:  Head: Normocephalic and atraumatic.  Mouth/Throat: No oropharyngeal exudate.  Eyes: Conjunctivae and EOM are normal. No scleral icterus.  Neck: Normal range of motion. Neck supple.  Cardiovascular: Normal rate, regular rhythm and normal heart sounds.  Exam reveals no gallop and no friction rub.   No murmur heard. Pulmonary/Chest: Effort normal. No respiratory distress. She has decreased breath sounds in the left middle field and the left lower field. She has no wheezes.  Abdominal: She exhibits no distension.  Musculoskeletal: She exhibits no edema or tenderness.  Neurological: She is alert and oriented to person, place, and time. She exhibits normal muscle tone. Coordination normal.  Skin: Skin is warm and dry. No rash noted. She is not diaphoretic. No erythema. No pallor.  Psychiatric: She has a normal mood and affect. Her behavior is normal. Judgment and thought content normal.   Scars from her CT surgery are well-healed        Assessment & Plan:   Splenic abscess: continue Augmentin and fluconazole until she has had repeat imaging in July. Is my understanding at that time that vascular surgery will consider whether or not she should have a splenectomy to clean up this infection. She has had important vaccines pre-potential splenectomy.  I spent greater than 25 minutes with the  patient including greater than  50% of time in face to face counsel of the patient Regarding the nature of her infection and management going forward including protracted oral antibiotics and repeat imaging.and in coordination of their care.  Empyema: status post cardiothoracic surgery CT abdomen pelvis does so hydrate hydro pneumothorax with some loculation on scan she see Dr. Servando Snare shortly

## 2015-03-01 ENCOUNTER — Other Ambulatory Visit: Payer: Self-pay | Admitting: Cardiothoracic Surgery

## 2015-03-01 DIAGNOSIS — J869 Pyothorax without fistula: Secondary | ICD-10-CM

## 2015-03-02 ENCOUNTER — Other Ambulatory Visit: Payer: Self-pay | Admitting: Physician Assistant

## 2015-03-02 ENCOUNTER — Ambulatory Visit
Admission: RE | Admit: 2015-03-02 | Discharge: 2015-03-02 | Disposition: A | Discharge status: Home / Self Care | Payer: Managed Care, Other (non HMO) | Source: Ambulatory Visit | Attending: Cardiothoracic Surgery | Admitting: Cardiothoracic Surgery

## 2015-03-02 ENCOUNTER — Ambulatory Visit: Payer: Self-pay | Admitting: Cardiothoracic Surgery

## 2015-03-02 DIAGNOSIS — G8918 Other acute postprocedural pain: Secondary | ICD-10-CM

## 2015-03-02 DIAGNOSIS — J869 Pyothorax without fistula: Secondary | ICD-10-CM

## 2015-03-02 MED ORDER — TRAMADOL HCL 50 MG PO TABS: 50.0000 mg | ORAL_TABLET | Freq: Four times a day (QID) | ORAL | Status: DC | PRN

## 2015-03-03 ENCOUNTER — Emergency Department (HOSPITAL_COMMUNITY): Payer: Managed Care, Other (non HMO)

## 2015-03-03 ENCOUNTER — Inpatient Hospital Stay (HOSPITAL_COMMUNITY)
Admission: EM | Admit: 2015-03-03 | Discharge: 2015-03-07 | DRG: 872 | Disposition: A | Discharge status: Home / Self Care | Payer: Managed Care, Other (non HMO) | Attending: Internal Medicine | Admitting: Internal Medicine

## 2015-03-03 ENCOUNTER — Encounter (HOSPITAL_COMMUNITY): Payer: Self-pay | Admitting: Nurse Practitioner

## 2015-03-03 DIAGNOSIS — Z87442 Personal history of urinary calculi: Secondary | ICD-10-CM

## 2015-03-03 DIAGNOSIS — D693 Immune thrombocytopenic purpura: Secondary | ICD-10-CM | POA: Diagnosis not present

## 2015-03-03 DIAGNOSIS — F329 Major depressive disorder, single episode, unspecified: Secondary | ICD-10-CM | POA: Diagnosis present

## 2015-03-03 DIAGNOSIS — Z90710 Acquired absence of both cervix and uterus: Secondary | ICD-10-CM | POA: Diagnosis not present

## 2015-03-03 DIAGNOSIS — Z6834 Body mass index (BMI) 34.0-34.9, adult: Secondary | ICD-10-CM | POA: Diagnosis not present

## 2015-03-03 DIAGNOSIS — E039 Hypothyroidism, unspecified: Secondary | ICD-10-CM | POA: Diagnosis present

## 2015-03-03 DIAGNOSIS — E440 Moderate protein-calorie malnutrition: Secondary | ICD-10-CM | POA: Diagnosis present

## 2015-03-03 DIAGNOSIS — E038 Other specified hypothyroidism: Secondary | ICD-10-CM | POA: Diagnosis not present

## 2015-03-03 DIAGNOSIS — R6881 Early satiety: Secondary | ICD-10-CM | POA: Diagnosis present

## 2015-03-03 DIAGNOSIS — F17210 Nicotine dependence, cigarettes, uncomplicated: Secondary | ICD-10-CM | POA: Diagnosis present

## 2015-03-03 DIAGNOSIS — A419 Sepsis, unspecified organism: Secondary | ICD-10-CM | POA: Diagnosis not present

## 2015-03-03 DIAGNOSIS — Z9049 Acquired absence of other specified parts of digestive tract: Secondary | ICD-10-CM | POA: Diagnosis present

## 2015-03-03 DIAGNOSIS — Z9851 Tubal ligation status: Secondary | ICD-10-CM

## 2015-03-03 DIAGNOSIS — D733 Abscess of spleen: Secondary | ICD-10-CM | POA: Diagnosis present

## 2015-03-03 DIAGNOSIS — IMO0001 Reserved for inherently not codable concepts without codable children: Secondary | ICD-10-CM | POA: Insufficient documentation

## 2015-03-03 HISTORY — DX: Disorder of kidney and ureter, unspecified: N28.9

## 2015-03-03 LAB — COMPREHENSIVE METABOLIC PANEL
ALT: 32 U/L (ref 0–35)
AST: 43 U/L — ABNORMAL HIGH (ref 0–37)
Albumin: 2.9 g/dL — ABNORMAL LOW (ref 3.5–5.2)
Alkaline Phosphatase: 102 U/L (ref 39–117)
Anion gap: 10 (ref 5–15)
BILIRUBIN TOTAL: 0.5 mg/dL (ref 0.3–1.2)
BUN: 9 mg/dL (ref 6–23)
CALCIUM: 8.6 mg/dL (ref 8.4–10.5)
CHLORIDE: 103 mmol/L (ref 96–112)
CO2: 23 mmol/L (ref 19–32)
CREATININE: 0.91 mg/dL (ref 0.50–1.10)
GFR, EST AFRICAN AMERICAN: 85 mL/min — AB (ref >90)
GFR, EST NON AFRICAN AMERICAN: 73 mL/min — AB (ref >90)
GLUCOSE: 115 mg/dL — AB (ref 70–99)
Potassium: 4.3 mmol/L (ref 3.5–5.1)
Sodium: 136 mmol/L (ref 135–145)
Total Protein: 7.1 g/dL (ref 6.0–8.3)

## 2015-03-03 LAB — CBC WITH DIFFERENTIAL/PLATELET
Basophils Absolute: 0.1 10*3/uL (ref 0.0–0.1)
Basophils Absolute: 0 10*3/uL (ref 0.0–0.1)
Basophils Relative: 1 % (ref 0–1)
Basophils Relative: 0 % (ref 0–1)
EOS PCT: 1 % (ref 0–5)
EOS PCT: 1 % (ref 0–5)
Eosinophils Absolute: 0.1 10*3/uL (ref 0.0–0.7)
Eosinophils Absolute: 0.1 10*3/uL (ref 0.0–0.7)
HCT: 38.6 % (ref 36.0–46.0)
HEMATOCRIT: 38.3 % (ref 36.0–46.0)
Hemoglobin: 12.5 g/dL (ref 12.0–15.0)
Hemoglobin: 12.3 g/dL (ref 12.0–15.0)
LYMPHS ABS: 0.6 10*3/uL — AB (ref 0.7–4.0)
LYMPHS PCT: 5 % — AB (ref 12–46)
LYMPHS PCT: 6 % — AB (ref 12–46)
Lymphs Abs: 0.5 10*3/uL — ABNORMAL LOW (ref 0.7–4.0)
MCH: 28.3 pg (ref 26.0–34.0)
MCH: 28.1 pg (ref 26.0–34.0)
MCHC: 32.1 g/dL (ref 30.0–36.0)
MCHC: 32.4 g/dL (ref 30.0–36.0)
MCV: 87.5 fL (ref 78.0–100.0)
MCV: 87.6 fL (ref 78.0–100.0)
MONO ABS: 0.5 10*3/uL (ref 0.1–1.0)
MONO ABS: 0.6 10*3/uL (ref 0.1–1.0)
MONOS PCT: 6 % (ref 3–12)
Monocytes Relative: 5 % (ref 3–12)
NEUTROS ABS: 8.8 10*3/uL — AB (ref 1.7–7.7)
NEUTROS PCT: 86 % — AB (ref 43–77)
Neutro Abs: 9.2 10*3/uL — ABNORMAL HIGH (ref 1.7–7.7)
Neutrophils Relative %: 89 % — ABNORMAL HIGH (ref 43–77)
Platelets: 165 10*3/uL (ref 150–400)
Platelets: 189 10*3/uL (ref 150–400)
RBC: 4.41 MIL/uL (ref 3.87–5.11)
RBC: 4.37 MIL/uL (ref 3.87–5.11)
RDW: 13.6 % (ref 11.5–15.5)
RDW: 13.6 % (ref 11.5–15.5)
WBC: 10.3 10*3/uL (ref 4.0–10.5)
WBC: 10.1 10*3/uL (ref 4.0–10.5)

## 2015-03-03 LAB — URINALYSIS, ROUTINE W REFLEX MICROSCOPIC
BILIRUBIN URINE: NEGATIVE
Glucose, UA: NEGATIVE mg/dL
Hgb urine dipstick: NEGATIVE
Ketones, ur: NEGATIVE mg/dL
Leukocytes, UA: NEGATIVE
Nitrite: NEGATIVE
Protein, ur: NEGATIVE mg/dL
SPECIFIC GRAVITY, URINE: 1.013 (ref 1.005–1.030)
Urobilinogen, UA: 0.2 mg/dL (ref 0.0–1.0)
pH: 7 (ref 5.0–8.0)

## 2015-03-03 LAB — I-STAT BETA HCG BLOOD, ED (MC, WL, AP ONLY)

## 2015-03-03 LAB — PROCALCITONIN: Procalcitonin: 4.58 ng/mL

## 2015-03-03 LAB — I-STAT CG4 LACTIC ACID, ED
Lactic Acid, Venous: 1.48 mmol/L (ref 0.5–2.0)
Lactic Acid, Venous: 1.38 mmol/L (ref 0.5–2.0)

## 2015-03-03 LAB — APTT: aPTT: 33 seconds (ref 24–37)

## 2015-03-03 LAB — PROTIME-INR
INR: 1.23 (ref 0.00–1.49)
PROTHROMBIN TIME: 15.6 s — AB (ref 11.6–15.2)

## 2015-03-03 MED ORDER — SODIUM CHLORIDE 0.9 % IJ SOLN: 3.0000 mL | INTRAMUSCULAR | Status: DC | PRN

## 2015-03-03 MED ORDER — TRAMADOL HCL 50 MG PO TABS
50.0000 mg | ORAL_TABLET | Freq: Four times a day (QID) | ORAL | Status: DC | PRN
  Administered 2015-03-03 – 2015-03-06 (×4): 100 mg via ORAL
  Filled 2015-03-03 (×4): qty 2

## 2015-03-03 MED ORDER — PIPERACILLIN-TAZOBACTAM 3.375 G IVPB
3.3750 g | Freq: Three times a day (TID) | INTRAVENOUS | Status: DC
  Administered 2015-03-03 – 2015-03-06 (×8): 3.375 g via INTRAVENOUS
  Filled 2015-03-03 (×11): qty 50

## 2015-03-03 MED ORDER — ACETAMINOPHEN 325 MG PO TABS
650.0000 mg | ORAL_TABLET | Freq: Four times a day (QID) | ORAL | Status: DC | PRN
  Administered 2015-03-03: 650 mg via ORAL
  Filled 2015-03-03: qty 2

## 2015-03-03 MED ORDER — ONDANSETRON HCL 4 MG PO TABS: 4.0000 mg | ORAL_TABLET | Freq: Four times a day (QID) | ORAL | Status: DC | PRN

## 2015-03-03 MED ORDER — SODIUM CHLORIDE 0.9 % IV SOLN: 250.0000 mL | INTRAVENOUS | Status: DC | PRN

## 2015-03-03 MED ORDER — ONDANSETRON HCL 4 MG/2ML IJ SOLN
4.0000 mg | Freq: Four times a day (QID) | INTRAMUSCULAR | Status: DC | PRN
  Administered 2015-03-06: 4 mg via INTRAVENOUS
  Filled 2015-03-03: qty 2

## 2015-03-03 MED ORDER — VANCOMYCIN HCL IN DEXTROSE 1-5 GM/200ML-% IV SOLN
1000.0000 mg | Freq: Once | INTRAVENOUS | Status: AC
  Administered 2015-03-03: 1000 mg via INTRAVENOUS
  Filled 2015-03-03: qty 200

## 2015-03-03 MED ORDER — IOHEXOL 350 MG/ML SOLN
100.0000 mL | Freq: Once | INTRAVENOUS | Status: AC | PRN
  Administered 2015-03-03: 100 mL via INTRAVENOUS

## 2015-03-03 MED ORDER — ACETAMINOPHEN 500 MG PO TABS
1000.0000 mg | ORAL_TABLET | Freq: Once | ORAL | Status: AC
  Administered 2015-03-03: 1000 mg via ORAL
  Filled 2015-03-03: qty 2

## 2015-03-03 MED ORDER — FLUCONAZOLE IN SODIUM CHLORIDE 400-0.9 MG/200ML-% IV SOLN
400.0000 mg | INTRAVENOUS | Status: DC
  Administered 2015-03-04 – 2015-03-05 (×2): 400 mg via INTRAVENOUS
  Filled 2015-03-03 (×3): qty 200

## 2015-03-03 MED ORDER — ONDANSETRON HCL 4 MG/2ML IJ SOLN
INTRAMUSCULAR | Status: AC
  Administered 2015-03-03: 4 mg
  Filled 2015-03-03: qty 2

## 2015-03-03 MED ORDER — ENOXAPARIN SODIUM 40 MG/0.4ML ~~LOC~~ SOLN
40.0000 mg | SUBCUTANEOUS | Status: DC
  Administered 2015-03-03 – 2015-03-05 (×3): 40 mg via SUBCUTANEOUS
  Filled 2015-03-03 (×5): qty 0.4

## 2015-03-03 MED ORDER — SODIUM CHLORIDE 0.9 % IJ SOLN
3.0000 mL | Freq: Two times a day (BID) | INTRAMUSCULAR | Status: DC
  Administered 2015-03-03 – 2015-03-07 (×3): 3 mL via INTRAVENOUS

## 2015-03-03 MED ORDER — LEVOTHYROXINE SODIUM 75 MCG PO TABS
75.0000 ug | ORAL_TABLET | Freq: Every day | ORAL | Status: DC
  Administered 2015-03-04 – 2015-03-07 (×4): 75 ug via ORAL
  Filled 2015-03-03 (×6): qty 1

## 2015-03-03 MED ORDER — SODIUM CHLORIDE 0.9 % IV BOLUS (SEPSIS)
1000.0000 mL | INTRAVENOUS | Status: AC
  Administered 2015-03-03 (×3): 1000 mL via INTRAVENOUS

## 2015-03-03 MED ORDER — ACETAMINOPHEN 650 MG RE SUPP: 650.0000 mg | Freq: Four times a day (QID) | RECTAL | Status: DC | PRN

## 2015-03-03 MED ORDER — SODIUM CHLORIDE 0.9 % IV SOLN
INTRAVENOUS | Status: DC
  Administered 2015-03-03 – 2015-03-06 (×5): via INTRAVENOUS

## 2015-03-03 MED ORDER — DOCUSATE SODIUM 100 MG PO CAPS
100.0000 mg | ORAL_CAPSULE | Freq: Two times a day (BID) | ORAL | Status: DC
  Administered 2015-03-03 – 2015-03-07 (×9): 100 mg via ORAL
  Filled 2015-03-03 (×9): qty 1

## 2015-03-03 MED ORDER — OXYCODONE HCL 5 MG PO TABS: 5.0000 mg | ORAL_TABLET | ORAL | Status: DC | PRN

## 2015-03-03 MED ORDER — FLUCONAZOLE IN SODIUM CHLORIDE 400-0.9 MG/200ML-% IV SOLN
800.0000 mg | Freq: Once | INTRAVENOUS | Status: AC
  Administered 2015-03-03: 800 mg via INTRAVENOUS
  Filled 2015-03-03: qty 400

## 2015-03-03 MED ORDER — PIPERACILLIN-TAZOBACTAM 3.375 G IVPB 30 MIN
3.3750 g | Freq: Once | INTRAVENOUS | Status: AC
  Administered 2015-03-03: 3.375 g via INTRAVENOUS
  Filled 2015-03-03: qty 50

## 2015-03-03 MED ORDER — SODIUM CHLORIDE 0.9 % IJ SOLN
3.0000 mL | Freq: Two times a day (BID) | INTRAMUSCULAR | Status: DC
  Administered 2015-03-03 – 2015-03-04 (×2): 3 mL via INTRAVENOUS

## 2015-03-03 MED ORDER — MORPHINE SULFATE 2 MG/ML IJ SOLN: 1.0000 mg | INTRAMUSCULAR | Status: DC | PRN

## 2015-03-03 MED ORDER — ZOLPIDEM TARTRATE 5 MG PO TABS: 5.0000 mg | ORAL_TABLET | Freq: Every evening | ORAL | Status: DC | PRN

## 2015-03-03 NOTE — Progress Notes (Signed)
Attending  Patient was seen, examined,treatment plan was discussed with the Physician extender. I have directly reviewed the clinical findings, lab, imaging studies and management of this patient in detail. I have made the necessary changes to the above noted documentation, and agree with the documentation, as recorded by the Physician extender.  49 year old female with a history of hypothyroidism, depression was diagnosed with a splenic artery aneurysm. She had embolization on 12/26/2014 by vascular surgery. Postoperatively, the patient developed splenic abscesses and subsequently an empyema. She underwent a VATS procedure on 02/06/2015. She had needle drainage of her splenic abscesses on 02/01/2015. Cultures grew diphtheroids. Gram stain showed yeast. Cultures from her VATS procedure were unremarkable. The patient was treated with Zosyn and fluconazole. Her Zosyn was narrowed to Unasyn. She was discharged home with Augmentin and fluconazole po.  She returns on 03/03/2015 with abdominal discomfort and fevers and chills. CT of the abdomen and pelvis revealed an increase and splenic fluid collections while there was a decrease in her larger splenic fluid collection. There is concern for antibiotic failure and recurrent splenic abscesses. Infectious disease, general surgery, and vascular surgery have all been consulted on 03/03/2015. The patient was started on intravenous Zosyn and fluconazole. Blood cultures have been obtained. Lactic acid was 1.48 at the time of admission. She will be fluid resuscitated. She received 3 L in the emergency department, and maintenance fluids increased to 100 mL per hour.  Orson Eva, DO [phone]

## 2015-03-03 NOTE — Progress Notes (Signed)
   Daily Progress Note  Assessment/Planning: s/p SAA embolization (4/66/59) complicated by splenic abscess and L empyema , s/p L VATS (02/12/15), s/p splenic abscess aspiration (02/01/15)   Pt readmitted with presumed infectious complication from SAA embolization  ID feels pt has already failed conservative measures  Would get General Surgery consultation for splenectomy  Pt was previously vaccinated  Abx per ID  Subjective    Fever, chill, nausea  Objective Filed Vitals:   03/03/15 1400 03/03/15 1415 03/03/15 1439 03/03/15 1510  BP: 101/58 112/72  137/68  Pulse: 74 73  103  Temp:   99.8 F (37.7 C) 102.5 F (39.2 C)  TempSrc:   Oral Oral  Resp: 18 18  18   Height:    5\' 2"  (1.575 m)  Weight:    186 lb (84.369 kg)  SpO2: 95% 97%  98%    Intake/Output Summary (Last 24 hours) at 03/03/15 1542 Last data filed at 03/03/15 1044  Gross per 24 hour  Intake   1000 ml  Output      1 ml  Net    999 ml    PULM  CTAB CV  RRR GI  soft, minimal LUQ TTP, -G/R  Laboratory CBC    Component Value Date/Time   WBC 10.3 03/03/2015 1019   WBC 6.3 06/11/2012 1012   HGB 12.3 03/03/2015 1019   HGB 15.6 06/11/2012 1012   HCT 38.3 03/03/2015 1019   HCT 44.0 06/11/2012 1012   PLT 189 03/03/2015 1019   PLT 108* 06/11/2012 1012    BMET    Component Value Date/Time   NA 136 03/03/2015 1019   K 4.3 03/03/2015 1019   CL 103 03/03/2015 1019   CO2 23 03/03/2015 1019   GLUCOSE 115* 03/03/2015 1019   BUN 9 03/03/2015 1019   CREATININE 0.91 03/03/2015 1019   CREATININE 0.83 03/31/2014 1044   CALCIUM 8.6 03/03/2015 1019   GFRNONAA 73* 03/03/2015 1019   GFRAA 85* 03/03/2015 [place], MD [facility]: [phone] Pager: [phone]  03/03/2015, 3:42 PM

## 2015-03-03 NOTE — Consult Note (Signed)
Reason for Consult:Splenic abscess Referring Physician: Tat  DELORESE Thomas is an 49 y.o. female.  HPI: Patient with multiple complication after splenic artery aneurysm embolization including empyema requiring VATS procedure, and fungal splenic abscess treated with prolonged IV and oral antibiotic.  Patient was feeling okay, eating well without abdominal pain, developed recurrent fevers and chills.  CT demonstrates what appears to be splenic abscesses.  She is considered to be a failure of medical management of her recalcitrant splenic abscesses, and splenectomy should be considered.  Past Medical History  Diagnosis Date  . Hypocalcemia   . Depression 04/30/2011  . Hypothyroidism 04/30/2011  . History of kidney stones   . Right ureteral stone   . Chronic idiopathic thrombocytopenia     MILD--   HEMATOLOGIST--  DR Marin Olp  . PONV (postoperative nausea and vomiting)   . 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 Renal insufficiency     Past Surgical History  Procedure Laterality Date  . Cholecystectomy  2000  . Cesarean section  1996 and 1998  . Tubal ligation  1999  . Knee arthroscopy    . Robotic assisted total hysterectomy  10-15-2010    LAPAROSCOPIC /  LYSIS ADHESIONS  . Extracorporeal shock wave lithotripsy  2003  . Cystoscopy with retrograde pyelogram, ureteroscopy and stent placement Right 07/25/2013    Procedure: CYSTOSCOPY WITH RETROGRADE PYELOGRAM, URETEROSCOPY, STONE EXTRACTION AND POSSIBLE STENT PLACEMENT;  Surgeon: Ailene Rud, MD;  [facility];  Service: Urology;  Laterality: Right;  . Abdominal hysterectomy    . Embolization N/A 12/26/2014    Procedure: EMBOLIZATION;  Surgeon: Serafina Mitchell, MD;  Location: [facility] CATH LAB;  Service: Cardiovascular;  Laterality: N/A;  . Peripheral vascular catheterization  12/26/2014    Procedure: VISCERAL ANGIOGRAPHY;  Surgeon: Serafina Mitchell, MD;  Location: [facility] CATH LAB;  Service: Cardiovascular;;  . Video  bronchoscopy N/A 02/06/2015    Procedure: VIDEO BRONCHOSCOPY;  Surgeon: Grace Isaac, MD;  Location: [facility] OR;  Service: Thoracic;  Laterality: N/A;  . Video assisted thoracoscopy (vats)/empyema Left 02/06/2015    Procedure: LEFT VIDEO ASSISTED THORACOSCOPY (VATS)/DRAINAGE OF EMPYEMA;  Surgeon: Grace Isaac, MD;  Location: [facility] OR;  Service: Thoracic;  Laterality: Left;  (L)VATS, DRAINAGE OF EMPYEMA    Family History  Problem Relation Age of Onset  . Diabetes Mother     type 2  . Asthma Mother   . Cancer Mother     skin cancer  . Cancer Father     bone  . Mental illness Sister     schizophrenia  . Alcohol abuse Maternal Grandmother   . Stroke Maternal Grandmother   . Aneurysm Maternal Grandmother     brain aneurysm  . Other Maternal Grandfather     black lung  . Alzheimer's disease Paternal Grandmother   . Stroke Maternal Aunt     Social History:  reports that she has been smoking Cigarettes.  She has a 30 pack-year smoking history. She has never used smokeless tobacco. She reports that she drinks alcohol. She reports that she does not use illicit drugs.  Allergies: No Known Allergies  Medications: I have reviewed the patient's current medications.  Results for orders placed or performed during the hospital encounter of 03/03/15 (from the past 48 hour(s))  Urinalysis, Routine w reflex microscopic     Status: None   Collection Time: 03/03/15  9:57 AM  Result Value Ref Range   Color, Urine YELLOW YELLOW  APPearance CLEAR CLEAR   Specific Gravity, Urine 1.013 1.005 - 1.030   pH 7.0 5.0 - 8.0   Glucose, UA NEGATIVE NEGATIVE mg/dL   Hgb urine dipstick NEGATIVE NEGATIVE   Bilirubin Urine NEGATIVE NEGATIVE   Ketones, ur NEGATIVE NEGATIVE mg/dL   Protein, ur NEGATIVE NEGATIVE mg/dL   Urobilinogen, UA 0.2 0.0 - 1.0 mg/dL   Nitrite NEGATIVE NEGATIVE   Leukocytes, UA NEGATIVE NEGATIVE    Comment: MICROSCOPIC NOT DONE ON URINES WITH NEGATIVE PROTEIN, BLOOD, LEUKOCYTES,  NITRITE, OR GLUCOSE <1000 mg/dL.  CBC with Differential/Platelet     Status: Abnormal   Collection Time: 03/03/15 10:15 AM  Result Value Ref Range   WBC 10.1 4.0 - 10.5 K/uL   RBC 4.41 3.87 - 5.11 MIL/uL   Hemoglobin 12.5 12.0 - 15.0 g/dL   HCT 38.6 36.0 - 46.0 %   MCV 87.5 78.0 - 100.0 fL   MCH 28.3 26.0 - 34.0 pg   MCHC 32.4 30.0 - 36.0 g/dL   RDW 13.6 11.5 - 15.5 %   Platelets 165 150 - 400 K/uL   Neutrophils Relative % 86 (H) 43 - 77 %   Neutro Abs 8.8 (H) 1.7 - 7.7 K/uL   Lymphocytes Relative 6 (L) 12 - 46 %   Lymphs Abs 0.6 (L) 0.7 - 4.0 K/uL   Monocytes Relative 6 3 - 12 %   Monocytes Absolute 0.6 0.1 - 1.0 K/uL   Eosinophils Relative 1 0 - 5 %   Eosinophils Absolute 0.1 0.0 - 0.7 K/uL   Basophils Relative 1 0 - 1 %   Basophils Absolute 0.1 0.0 - 0.1 K/uL  CBC WITH DIFFERENTIAL     Status: Abnormal   Collection Time: 03/03/15 10:19 AM  Result Value Ref Range   WBC 10.3 4.0 - 10.5 K/uL   RBC 4.37 3.87 - 5.11 MIL/uL   Hemoglobin 12.3 12.0 - 15.0 g/dL   HCT 38.3 36.0 - 46.0 %   MCV 87.6 78.0 - 100.0 fL   MCH 28.1 26.0 - 34.0 pg   MCHC 32.1 30.0 - 36.0 g/dL   RDW 13.6 11.5 - 15.5 %   Platelets 189 150 - 400 K/uL   Neutrophils Relative % 89 (H) 43 - 77 %   Neutro Abs 9.2 (H) 1.7 - 7.7 K/uL   Lymphocytes Relative 5 (L) 12 - 46 %   Lymphs Abs 0.5 (L) 0.7 - 4.0 K/uL   Monocytes Relative 5 3 - 12 %   Monocytes Absolute 0.5 0.1 - 1.0 K/uL   Eosinophils Relative 1 0 - 5 %   Eosinophils Absolute 0.1 0.0 - 0.7 K/uL   Basophils Relative 0 0 - 1 %   Basophils Absolute 0.0 0.0 - 0.1 K/uL  Comprehensive metabolic panel     Status: Abnormal   Collection Time: 03/03/15 10:19 AM  Result Value Ref Range   Sodium 136 135 - 145 mmol/L   Potassium 4.3 3.5 - 5.1 mmol/L   Chloride 103 96 - 112 mmol/L   CO2 23 19 - 32 mmol/L   Glucose, Bld 115 (H) 70 - 99 mg/dL   BUN 9 6 - 23 mg/dL   Creatinine, Ser 0.91 0.50 - 1.10 mg/dL   Calcium 8.6 8.4 - 10.5 mg/dL   Total Protein 7.1 6.0 -  8.3 g/dL   Albumin 2.9 (L) 3.5 - 5.2 g/dL   AST 43 (H) 0 - 37 U/L   ALT 32 0 - 35 U/L   Alkaline Phosphatase  102 39 - 117 U/L   Total Bilirubin 0.5 0.3 - 1.2 mg/dL   GFR calc non Af Amer 73 (L) >90 mL/min   GFR calc Af Amer 85 (L) >90 mL/min    Comment: (NOTE) The eGFR has been calculated using the CKD EPI equation. This calculation has not been validated in all clinical situations. eGFR's persistently <90 mL/min signify possible Chronic Kidney Disease.    Anion gap 10 5 - 15  I-Stat Beta hCG blood, ED ([facility], [facility], [facility] only)     Status: None   Collection Time: 03/03/15 10:24 AM  Result Value Ref Range   I-stat hCG, quantitative <5.0 <5 mIU/mL   Comment 3            Comment:   GEST. AGE      CONC.  (mIU/mL)   <=1 WEEK        5 - 50     2 WEEKS       50 - 500     3 WEEKS       100 - 10,000     4 WEEKS     1,000 - 30,000        FEMALE AND NON-PREGNANT FEMALE:     LESS THAN 5 mIU/mL   I-Stat CG4 Lactic Acid, ED (not at [facility])     Status: None   Collection Time: 03/03/15 10:47 AM  Result Value Ref Range   Lactic Acid, Venous 1.48 0.5 - 2.0 mmol/L  I-Stat CG4 Lactic Acid, ED (not at [facility])     Status: None   Collection Time: 03/03/15  1:23 PM  Result Value Ref Range   Lactic Acid, Venous 1.38 0.5 - 2.0 mmol/L  Procalcitonin     Status: None   Collection Time: 03/03/15  4:11 PM  Result Value Ref Range   Procalcitonin 4.58 ng/mL    Comment:        Interpretation: PCT > 2 ng/mL: Systemic infection (sepsis) is likely, unless other causes are known. (NOTE)         ICU PCT Algorithm               Non ICU PCT Algorithm    ----------------------------     ------------------------------         PCT < 0.25 ng/mL                 PCT < 0.1 ng/mL     Stopping of antibiotics            Stopping of antibiotics       strongly encouraged.               strongly encouraged.    ----------------------------     ------------------------------       PCT level decrease by               PCT < 0.25  ng/mL       >= 80% from peak PCT       OR PCT 0.25 - 0.5 ng/mL          Stopping of antibiotics                                             encouraged.     Stopping of antibiotics           encouraged.    ----------------------------     ------------------------------  PCT level decrease by              PCT >= 0.25 ng/mL       < 80% from peak PCT        AND PCT >= 0.5 ng/mL            Continuing antibiotics                                               encouraged.       Continuing antibiotics            encouraged.    ----------------------------     ------------------------------     PCT level increase compared          PCT > 0.5 ng/mL         with peak PCT AND          PCT >= 0.5 ng/mL             Escalation of antibiotics                                          strongly encouraged.      Escalation of antibiotics        strongly encouraged.   Protime-INR     Status: Abnormal   Collection Time: 03/03/15  4:11 PM  Result Value Ref Range   Prothrombin Time 15.6 (H) 11.6 - 15.2 seconds   INR 1.23 0.00 - 1.49  APTT     Status: None   Collection Time: 03/03/15  4:11 PM  Result Value Ref Range   aPTT 33 24 - 37 seconds    Dg Chest 2 View  03/02/2015   CLINICAL DATA:  Empyema of the pleura. Status post VATS left lung 02/06/2015. Left-sided chest discomfort since procedure.  EXAM: CHEST  2 VIEW  COMPARISON:  02/11/2015  FINDINGS: Heart size is normal. There is left-sided pleural thickening and elevation of the left hemidiaphragm. However overall there has been improvement in the appearance of left-sided pleural change and aeration within the left lower lobe. There are areas of atelectasis or scarring which persist.  The right lung is clear.  There is no pulmonary edema.  Vascular coil is identified in the left upper quadrant. There are surgical clips in the right upper quadrant.  IMPRESSION: 1. Improved appearance of pleural thickening and lower lobe opacity on the left. 2.  Persistent elevation of left hemidiaphragm and mild left lower lobe scarring or atelectasis.   Electronically Signed   By: Nolon Nations M.D.   On: 03/02/2015 08:41   Dg Chest Port 1 View  03/03/2015   CLINICAL DATA:  49 year old with current history of splenic fungal abscess for which she is undergoing antifungal therapy. Patient woke up this morning with tachycardia, fever, chills, nausea and vomiting. Recent chest tube drainage of a large left pleural effusion 02/06/2015.  EXAM: PORTABLE CHEST - 1 VIEW  COMPARISON:  Two-view chest x-ray yesterday, 02/11/2015 and earlier. Visualized lung bases on CT abdomen 02/16/2015.  FINDINGS: Cardiac silhouette upper normal in size, unchanged. Mild elevation of the left hemidiaphragm and small left pleural effusion/pleural scarring at the left base, unchanged. Pleuroparenchymal scarring involving the left mid lung, unchanged. Right lung remains clear and there  are no new pulmonary parenchymal abnormalities. 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  IMPRESSION: 1. No new/acute cardiopulmonary disease. 2. Stable elevation of the left hemidiaphragm and small left pleural effusion/pleural scarring involving the left lung base. 3. Stable pleuroparenchymal scarring involving the left mid lung.   Electronically Signed   By: Evangeline Dakin M.D.   On: 03/03/2015 11:42   Ct Cta Abd/pel W/cm &/or W/o Cm  03/03/2015   CLINICAL DATA:  Abdominal pain, tachycardia  EXAM: CTA ABDOMEN AND PELVIS WITH CONTRAST  TECHNIQUE: Multidetector CT imaging of the abdomen and pelvis was performed using the standard protocol during bolus administration of intravenous contrast. Multiplanar reconstructed images and MIPs were obtained and reviewed to evaluate the vascular anatomy.  CONTRAST:  177m OMNIPAQUE IOHEXOL 350 MG/ML SOLN  COMPARISON:  [facility]  FINDINGS: Lower chest: Small loculated left hydro pneumothorax is decreased since previously. Curvilinear left lower lobe scarring or atelectasis is reidentified.  Hepatobiliary: Inhomogeneous hepatic enhancement is identified with geographical hypodensity suggesting steatosis but no focal mass. Cholecystectomy clips are noted.  Pancreas: Normal  Spleen: Extensive streak artifact is noted from metallic artifact at the splenic hilum. The previously seen mid splenic fluid collection now measures 6.0 x 3.1 cm, decreased from previously. Trace pericolonic fluid is identified, slightly increased from previously. Other areas of irregular hypo enhancement within the spleen are reidentified, obscured by streak artifact. One of these at the dome of the spleen image 31 series 41 is subjectively and objectively larger, now 2.1 x 1.6 cm.  Adrenals/Urinary Tract: Adrenal glands are normal. Areas of renal cortical scarring are present bilaterally. Nonobstructing left renal calculi reidentified. No new hydroureteronephrosis.  Stomach/Bowel: No bowel wall thickening or focal segmental dilatation is identified. Normal appendix.  Vascular/Lymphatic: Small retroperitoneal nodes are stable. No new lymphadenopathy. No aortic aneurysm or dissection. No periaortic fluid.  Reproductive: Uterus surgically absent.  Ovaries are unremarkable.  Other: No free air.  Musculoskeletal: No acute osseous abnormality. Bilateral L5 pars interarticularis defects. Sacroiliac periarticular sclerosis reidentified.  Review of the MIP images confirms the above findings.  IMPRESSION: Mixed response with decrease in largest splenic fluid collection but interval enlargement of other intra splenic collections. This could reflect bland necrosis after embolization although infection could appear similar.  Decreased now small left hydro pneumothorax.   Electronically Signed   By: GConchita ParisM.D.   On: 03/03/2015 13:18    ROS Blood pressure 125/66, pulse 101, temperature 101.7 F (38.7 C), temperature source Oral, resp. rate 17, height _0  (1.575 m), weight 84.369 kg (186 lb), last menstrual period 08/10/2010, SpO2 96 %. Physical Exam  Constitutional: She is oriented to person, place, and time. She appears well-developed and well-nourished.  Moderately obese  HENT:  Head: Normocephalic and atraumatic.  Very pleasant  Eyes: Conjunctivae and EOM are normal. Pupils are equal, round, and reactive to light.  Neck: Normal range of motion. Neck supple.  Cardiovascular: Normal rate, regular rhythm, normal heart sounds and intact distal pulses.   Respiratory: Effort normal and breath sounds normal.  GI: Soft. Bowel sounds are normal. She exhibits no distension and no mass. There is no tenderness. There is no rebound and no guarding.  Musculoskeletal: Normal range of motion.  Neurological: She is alert and oriented to person, place, and time.  Skin: Skin is warm and dry.  Psychiatric: She has a normal mood and affect.    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 This will likely not be an easy endeavor considering that she has already had a VATS procedure, the diaphragm is likely attached to the spleen, and it may be an inflammatory mess. Currently she does not require any procedure, and we must plan carefully if this is to be done .  Probably would not be done laparoscopically, but would not rule that out as a possibility.  Continue IV antibiotics.  Slate Debroux 03/03/2015, 9:42 PM

## 2015-03-03 NOTE — ED Provider Notes (Signed)
CSN: [id number]     Arrival date & time 03/03/15  0160 History   First MD Initiated Contact with Patient 03/03/15 [phone]     Chief Complaint  Patient presents with  . Code Sepsis     (Consider location/radiation/quality/duration/timing/severity/associated sxs/prior Treatment) HPI  Peggy Thomas is a 49 y.o. 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 She denies any abdominal pain. Pt is tachycardic. He denies any chest pain, shortness of breath, difficulty breathing, cough. Patient has been taking tramadol for postop pain. No urinary symptoms.  Patient underwent splenic cardiac embolization on 12/26/2014 and developed fevers and was admitted March 24 for splenic abscess that was aspirated. While hospitalized patient developed shortness of breath and was diagnosed with empyema and had VATS performed. Patient was discharged from the hospital 02/12/2015 on Augmentin and fluconazole which she is currently been taking.   Past Medical History  Diagnosis Date  . Hypocalcemia   . Depression 04/30/2011  . Hypothyroidism 04/30/2011  . History of kidney stones   . Right ureteral stone   . Chronic idiopathic thrombocytopenia     MILD--   HEMATOLOGIST--  DR Marin Olp  . PONV (postoperative nausea and vomiting)   . 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 Renal insufficiency    Past Surgical History  Procedure Laterality Date  . Cholecystectomy  2000  . Cesarean section  1996 and 1998  . Tubal ligation  1999  . Knee arthroscopy    . Robotic assisted total hysterectomy  10-15-2010    LAPAROSCOPIC /  LYSIS ADHESIONS  . Extracorporeal shock wave lithotripsy  2003  . Cystoscopy with retrograde pyelogram, ureteroscopy and stent placement Right 07/25/2013    Procedure: CYSTOSCOPY WITH  RETROGRADE PYELOGRAM, URETEROSCOPY, STONE EXTRACTION AND POSSIBLE STENT PLACEMENT;  Surgeon: Ailene Rud, MD;  Location: [facility];  Service: Urology;  Laterality: Right;  . Abdominal hysterectomy    . Embolization N/A 12/26/2014    Procedure: EMBOLIZATION;  Surgeon: Serafina Mitchell, MD;  Location: [facility] CATH LAB;  Service: Cardiovascular;  Laterality: N/A;  . Peripheral vascular catheterization  12/26/2014    Procedure: VISCERAL ANGIOGRAPHY;  Surgeon: Serafina Mitchell, MD;  Location[facility] CATH LAB;  Service: Cardiovascular;;  . Video bronchoscopy N/A 02/06/2015    Procedure: VIDEO BRONCHOSCOPY;  Surgeon: Grace Isaac, MD;  Location: [facility] OR;  Service: Thoracic;  Laterality: N/A;  . Video assisted thoracoscopy (vats)/empyema Left 02/06/2015    Procedure: LEFT VIDEO ASSISTED THORACOSCOPY (VATS)/DRAINAGE OF EMPYEMA;  Surgeon: Grace Isaac, MD;  Location: [facility] OR;  Service: Thoracic;  Laterality: Left;  (L)VATS, DRAINAGE OF EMPYEMA   Family History  Problem Relation Age of Onset  . Diabetes Mother     type 2  . Asthma Mother   . Cancer Mother     skin cancer  . Cancer Father     bone  . Mental illness Sister     schizophrenia  . Alcohol abuse Maternal Grandmother   . Stroke Maternal Grandmother   . Aneurysm Maternal Grandmother     brain aneurysm  . Other Maternal Grandfather     black lung  . Alzheimer's disease Paternal Grandmother   . Stroke Maternal Aunt    History  Substance Use Topics  . Smoking status:  Current Every Day Smoker -- 1.00 packs/day for 30 years    Types: Cigarettes  . Smokeless tobacco: Never Used  . Alcohol Use: Yes     Comment: occasionally/socially   OB History    Gravida Para Term Preterm AB TAB SAB Ectopic Multiple Living   2 2        2      Review of Systems 10 Systems reviewed and are negative for acute change except as noted in the HPI.    Allergies  Review of patient's allergies indicates no known allergies.  Home  Medications   Prior to Admission medications   Medication Sig Start Date End Date Taking? Authorizing Provider  acetaminophen (TYLENOL) 500 MG tablet Take 1,000 mg by mouth every 6 (six) hours as needed for moderate pain.   Yes Historical Provider, MD  amoxicillin-clavulanate (AUGMENTIN) 875-125 MG per tablet Take 1 tablet by mouth 2 (two) times daily. 02/28/15  Yes Truman Hayward, MD  b complex vitamins tablet Take 1 tablet by mouth as needed (for energy).    Yes Historical Provider, MD  fluconazole (DIFLUCAN) 100 MG tablet Take 1 tablet (100 mg total) by mouth daily. 02/28/15  Yes Truman Hayward, MD  levothyroxine (SYNTHROID, LEVOTHROID) 75 MCG tablet TAKE 1 TABLET (75 MCG TOTAL) BY MOUTH DAILY BEFORE BREAKFAST. 12/11/14  Yes Mosie Lukes, MD  traMADol (ULTRAM) 50 MG tablet Take 1-2 tablets (50-100 mg total) by mouth every 6 (six) hours as needed (mild pain). 03/02/15  Yes Ivin Poot, MD  furosemide (LASIX) 20 MG tablet Take 1 tablet (20 mg total) by mouth daily. Patient not taking: Reported on 02/28/2015 03/31/14   Mosie Lukes, MD  ibuprofen (ADVIL,MOTRIN) 600 MG tablet Take 1 tablet (600 mg total) by mouth every 8 (eight) hours as needed for headache, mild pain or moderate pain. Patient not taking: Reported on 02/28/2015 03/31/14   Mosie Lukes, MD  oxyCODONE (OXY IR/ROXICODONE) 5 MG immediate release tablet Take 1-2 tablets (5-10 mg total) by mouth every 4 (four) hours as needed for severe pain. Patient not taking: Reported on 02/28/2015 02/11/15   Junie Panning R Barrett, PA-C   BP 112/72 mmHg  Pulse 73  Temp(Src) 99.4 F (37.4 C) (Oral)  Resp 18  Ht 5\' 2"  (1.575 m)  Wt 187 lb 6 oz (84.993 kg)  BMI 34.26 kg/m2  SpO2 97%  LMP 08/10/2010 Physical Exam  Constitutional: She appears well-developed and well-nourished. No distress.  HENT:  Head: Normocephalic and atraumatic.  Eyes: Conjunctivae and EOM are normal. Right eye exhibits no discharge. Left eye exhibits no discharge.   Cardiovascular: Normal rate and regular rhythm.   Pulmonary/Chest: Effort normal and breath sounds normal. No respiratory distress. She has no wheezes.  Abdominal: Soft. Bowel sounds are normal. She exhibits no distension. There is no tenderness.  Neurological: She is alert. She exhibits normal muscle tone. Coordination normal.  Skin: Skin is warm and dry. She is not diaphoretic.  Nursing note and vitals reviewed.   ED Course  Procedures (including critical care time) Labs Review Labs Reviewed  CBC WITH DIFFERENTIAL/PLATELET - Abnormal; Notable for the following:    Neutrophils Relative % 89 (*)    Neutro Abs 9.2 (*)    Lymphocytes Relative 5 (*)    Lymphs Abs 0.5 (*)    All other components within normal limits  COMPREHENSIVE METABOLIC PANEL - Abnormal; Notable for the following:    Glucose, Bld 115 (*)    Albumin  2.9 (*)    AST 43 (*)    GFR calc non Af Amer 73 (*)    GFR calc Af Amer 85 (*)    All other components within normal limits  CBC WITH DIFFERENTIAL/PLATELET - Abnormal; Notable for the following:    Neutrophils Relative % 86 (*)    Neutro Abs 8.8 (*)    Lymphocytes Relative 6 (*)    Lymphs Abs 0.6 (*)    All other components within normal limits  CULTURE, BLOOD (ROUTINE X 2)  CULTURE, BLOOD (ROUTINE X 2)  URINE CULTURE  URINALYSIS, ROUTINE W REFLEX MICROSCOPIC  I-STAT CG4 LACTIC ACID, ED  I-STAT BETA HCG BLOOD, ED ([facility], [facility], [facility] ONLY)  I-STAT CG4 LACTIC ACID, ED    Imaging Review Dg Chest 2 View  03/02/2015   CLINICAL DATA:  Empyema of the pleura. Status post VATS left lung 02/06/2015. Left-sided chest discomfort since procedure.  EXAM: CHEST  2 VIEW  COMPARISON:  02/11/2015  FINDINGS: Heart size is normal. There is left-sided pleural thickening and elevation of the left hemidiaphragm. However overall there has been improvement in the appearance of left-sided pleural change and aeration within the left lower lobe. There are areas of atelectasis or scarring which  persist.  The right lung is clear.  There is no pulmonary edema.  Vascular coil is identified in the left upper quadrant. There are surgical clips in the right upper quadrant.  IMPRESSION: 1. Improved appearance of pleural thickening and lower lobe opacity on the left. 2. Persistent elevation of left hemidiaphragm and mild left lower lobe scarring or atelectasis.   Electronically Signed   By: Nolon Nations M.D.   On: 03/02/2015 08:41   Dg Chest Port 1 View  03/03/2015   CLINICAL DATA:  49 year old with current history of splenic fungal abscess for which she is undergoing antifungal therapy. Patient woke up this morning with tachycardia, fever, chills, nausea and vomiting. Recent chest tube drainage of a large left pleural effusion 02/06/2015.  EXAM: PORTABLE CHEST - 1 VIEW  COMPARISON:  Two-view chest x-ray yesterday, 02/11/2015 and earlier. Visualized lung bases on CT abdomen 02/16/2015.  FINDINGS: Cardiac silhouette upper normal in size, unchanged. Mild elevation of the left hemidiaphragm and small left pleural effusion/pleural scarring at the left base, unchanged. Pleuroparenchymal scarring involving the left mid lung, unchanged. Right lung remains clear and there are no new pulmonary parenchymal abnormalities. 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  IMPRESSION: 1. No new/acute cardiopulmonary disease. 2. Stable elevation of the left hemidiaphragm and small left pleural effusion/pleural scarring involving the left lung base. 3. Stable pleuroparenchymal scarring involving the left mid lung.   Electronically Signed   By: Evangeline Dakin M.D.   On: 03/03/2015 11:42   Ct Cta Abd/pel W/cm &/or W/o Cm  03/03/2015   CLINICAL DATA:  Abdominal pain, tachycardia  EXAM: CTA ABDOMEN AND PELVIS WITH CONTRAST  TECHNIQUE: Multidetector CT imaging of the abdomen and pelvis was performed using the standard  protocol during bolus administration of intravenous contrast. Multiplanar reconstructed images and MIPs were obtained and reviewed to evaluate the vascular anatomy.  CONTRAST:  178mL OMNIPAQUE IOHEXOL 350 MG/ML SOLN  COMPARISON:  The most recent available comparison exam is performed at [place] at [facility] on 02/16/2015.  FINDINGS: Lower chest: Small loculated left hydro pneumothorax is decreased since previously. Curvilinear left lower lobe scarring or atelectasis  is reidentified.  Hepatobiliary: Inhomogeneous hepatic enhancement is identified with geographical hypodensity suggesting steatosis but no focal mass. Cholecystectomy clips are noted.  Pancreas: Normal  Spleen: Extensive streak artifact is noted from metallic artifact at the splenic hilum. The previously seen mid splenic fluid collection now measures 6.0 x 3.1 cm, decreased from previously. Trace pericolonic fluid is identified, slightly increased from previously. Other areas of irregular hypo enhancement within the spleen are reidentified, obscured by streak artifact. One of these at the dome of the spleen image 31 series 41 is subjectively and objectively larger, now 2.1 x 1.6 cm.  Adrenals/Urinary Tract: Adrenal glands are normal. Areas of renal cortical scarring are present bilaterally. Nonobstructing left renal calculi reidentified. No new hydroureteronephrosis.  Stomach/Bowel: No bowel wall thickening or focal segmental dilatation is identified. Normal appendix.  Vascular/Lymphatic: Small retroperitoneal nodes are stable. No new lymphadenopathy. No aortic aneurysm or dissection. No periaortic fluid.  Reproductive: Uterus surgically absent.  Ovaries are unremarkable.  Other: No free air.  Musculoskeletal: No acute osseous abnormality. Bilateral L5 pars interarticularis defects. Sacroiliac periarticular sclerosis reidentified.  Review of the MIP images confirms the above findings.  IMPRESSION: Mixed response with decrease in  largest splenic fluid collection but interval enlargement of other intra splenic collections. This could reflect bland necrosis after embolization although infection could appear similar.  Decreased now small left hydro pneumothorax.   Electronically Signed   By: Conchita Paris M.D.   On: 03/03/2015 13:18     EKG Interpretation None      CRITICAL CARE Performed by: Makeyla Govan   Total critical care time: 35  Critical care time was exclusive of separately billable procedures and treating other patients.  Critical care was necessary to treat or prevent imminent or life-threatening deterioration.  Critical care was time spent personally by me on the following activities: development of treatment plan with patient and/or surrogate as well as nursing, discussions with consultants, evaluation of patient's response to treatment, examination of patient, obtaining history from patient or surrogate, ordering and performing treatments and interventions, ordering and review of laboratory studies, ordering and review of radiographic studies, pulse oximetry and re-evaluation of patient's condition.  Meds given in ED:  Medications  acetaminophen (TYLENOL) tablet 1,000 mg (1,000 mg Oral Given 03/03/15 1021)  sodium chloride 0.9 % bolus 1,000 mL (0 mLs Intravenous Stopped 03/03/15 1338)  piperacillin-tazobactam (ZOSYN) IVPB 3.375 g (0 g Intravenous Stopped 03/03/15 1102)  vancomycin (VANCOCIN) IVPB 1000 mg/200 mL premix (0 mg Intravenous Stopped 03/03/15 1150)  iohexol (OMNIPAQUE) 350 MG/ML injection 100 mL (100 mLs Intravenous Contrast Given 03/03/15 1231)    New Prescriptions   No medications on file      MDM   Final diagnoses:  Sepsis, due to unspecified organism  Splenic abscess   Patient with history of left renal artery embolism with resulting splenic abscess and then empyema hospitalized presenting with persistent fevers and nausea vomiting for 1 day while on augmenting and  fluconazole. Patient denies any abdominal pain, chest pain, shortness of breath. Patient is initially tachycardic with fever of 103.2. Sepsis pathways initiated. The lactic acidosis or white count. Patient with elevated neutrophils. Blood cultures ordered. UA without evidence of infection. Chest x-ray with residual scarring/pleural effusion of left lower lung and no new infiltrate. CT angiogram of abd/pelvis angio ordered and pending. Anticipate admission for persistent fevers, bp not responding to 3 L NS. CT with evidence of improvement of largest splenic fluid flexion with evidence of new collections. Consult to infectious  medicine. Spoke with Dr. Linus Salmons who recommended surgical removal of spleen due to outpatient antibiotic failure. Recommendation to continue IV Zosyn. Spoke with Haynes Dage of hospitalist who evaluated the patient with plan for admission to telemetry bed.  Discussed all results and patient verbalizes understanding and agrees with plan.  This is a shared patient. This patient was discussed with the physician who saw and evaluated the patient and agrees with the plan.  Filed Vitals:   03/03/15 1345 03/03/15 1400 03/03/15 1415 03/03/15 1439  BP: 105/61 101/58 112/72   Pulse: 78 74 73   Temp:    99.8 F (37.7 C)  TempSrc:    Oral  Resp: 17 18 18    Height:      Weight:      SpO2: 95% 95% 97%      Al Corpus, PA-C 03/03/15 [place], PA-C 03/03/15 1440

## 2015-03-03 NOTE — ED Notes (Signed)
Called CT and patient updated on wait time.

## 2015-03-03 NOTE — H&P (Signed)
[facility] History and Physical                                                                                    Peggy Thomas, is a 49 y.o. female  MRN: [id number]   DOB - 09/04/1966  Admit Date - 03/03/2015  Outpatient Primary MD for the patient is Penni Homans, MD  Referring Physician:    Chief Complaint:   Chief Complaint  Patient presents with  . 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  She presented to the emergency dept today with fever of 103.2 and vomiting.  Ms. 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  She was subsequently found to have splenic abscesses. She was treated conservatively with antibiotics, but developed empyema. She underwent VATS on 02/06/2015.  The fluid grew dipthroids and gram stain was positive for yeast.  She was treated with IV zosyn and fluconazole inpatient then discharged on augmentin and fluconazole on April 3.  She has been followed closely by Dr. Tommy Medal, and CTS.  Today she presents with fever and vomiting.  She states she has been having night sweats and rigors since February.  She reports losing 10 lbs unintentionally due to early satiety.  She complains that she remains fatigued - going to bed early every evening (5-7 pm).   He husband notes the surgical site had some drainage and "popped" open.  It appears to be healing now.  In the ER she demonstrates a fever of 103.2, WBC is 10.3, pulse rate 103 - 113.  She appears toxic with chills and rigors.  CT abdomen/pelvis indicates multiple splenic abscesses, some enlarged, and some have diminished since previous CT.    ID and CCS have been consulted.    Review of Systems   In addition to the HPI above,  +mild sore throat that started today. No Headache, No changes with Vision or hearing, No problems swallowing food or Liquids, No Chest pain,  Cough or Shortness of Breath, ++ Abdominal pain, Nausea or Vomiting, but Bowel movements are regular, No Blood in stool or Urine, No dysuria, No new skin rashes or bruises, No new joints pains-aches,  No new weakness, tingling, numbness in any extremity, No recent weight gain or loss, A full 10 point Review of Systems was done, except as stated above, all other Review of Systems were negative.  Past Medical History  Past Medical History  Diagnosis Date  . Hypocalcemia   . Depression 04/30/2011  . Hypothyroidism 04/30/2011  . History of kidney stones   . Right ureteral stone   . Chronic idiopathic thrombocytopenia     MILD--   HEMATOLOGIST--  DR Marin Olp  . PONV (postoperative nausea and vomiting)   . 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 Renal insufficiency     Past Surgical History  Procedure Laterality Date  . Cholecystectomy  2000  . Cesarean section  1996 and 1998  . Tubal ligation  1999  . Knee arthroscopy    .  Robotic assisted total hysterectomy  10-15-2010    LAPAROSCOPIC /  LYSIS ADHESIONS  . Extracorporeal shock wave lithotripsy  2003  . Cystoscopy with retrograde pyelogram, ureteroscopy and stent placement Right 07/25/2013    Procedure: CYSTOSCOPY WITH RETROGRADE PYELOGRAM, URETEROSCOPY, STONE EXTRACTION AND POSSIBLE STENT PLACEMENT;  Surgeon: Ailene Rud, MD;  Location: [facility];  Service: Urology;  Laterality: Right;  . Abdominal hysterectomy    . Embolization N/A 12/26/2014    Procedure: EMBOLIZATION;  Surgeon: Serafina Mitchell, MD;  Location: [facility] CATH LAB;  Service: Cardiovascular;  Laterality: N/A;  . Peripheral vascular catheterization  12/26/2014    Procedure: VISCERAL ANGIOGRAPHY;  Surgeon: Serafina Mitchell, MD;  Location: [facility] CATH LAB;  Service: Cardiovascular;;  . Video bronchoscopy N/A 02/06/2015    Procedure: VIDEO BRONCHOSCOPY;  Surgeon: Grace Isaac, MD;  Location: [facility] OR;  Service: Thoracic;  Laterality: N/A;  . Video assisted  thoracoscopy (vats)/empyema Left 02/06/2015    Procedure: LEFT VIDEO ASSISTED THORACOSCOPY (VATS)/DRAINAGE OF EMPYEMA;  Surgeon: Grace Isaac, MD;  Location: [place];  Service: Thoracic;  Laterality: Left;  (L)VATS, DRAINAGE OF EMPYEMA      Social History History  Substance Use Topics  . Smoking status: Current Every Day Smoker -- 1.00 packs/day for 30 years    Types: Cigarettes  . Smokeless tobacco: Never Used  . Alcohol Use: Yes     Comment: occasionally/socially  lives at home with husband.  Independent with ADLs.  Family History Family History  Problem Relation Age of Onset  . Diabetes Mother     type 2  . Asthma Mother   . Cancer Mother     skin cancer  . Cancer Father     bone  . Mental illness Sister     schizophrenia  . Alcohol abuse Maternal Grandmother   . Stroke Maternal Grandmother   . Aneurysm Maternal Grandmother     brain aneurysm  . Other Maternal Grandfather     black lung  . Alzheimer's disease Paternal Grandmother   . Stroke Maternal Aunt     Prior to Admission medications   Medication Sig Start Date End Date Taking? Authorizing Provider  acetaminophen (TYLENOL) 500 MG tablet Take 1,000 mg by mouth every 6 (six) hours as needed for moderate pain.   Yes Historical Provider, MD  amoxicillin-clavulanate (AUGMENTIN) 875-125 MG per tablet Take 1 tablet by mouth 2 (two) times daily. 02/28/15  Yes Truman Hayward, MD  b complex vitamins tablet Take 1 tablet by mouth as needed (for energy).    Yes Historical Provider, MD  fluconazole (DIFLUCAN) 100 MG tablet Take 1 tablet (100 mg total) by mouth daily. 02/28/15  Yes Truman Hayward, MD  levothyroxine (SYNTHROID, LEVOTHROID) 75 MCG tablet TAKE 1 TABLET (75 MCG TOTAL) BY MOUTH DAILY BEFORE BREAKFAST. 12/11/14  Yes Mosie Lukes, MD  traMADol (ULTRAM) 50 MG tablet Take 1-2 tablets (50-100 mg total) by mouth every 6 (six) hours as needed (mild pain). 03/02/15  Yes Ivin Poot, MD    No Known  Allergies  Physical Exam  Vitals  Blood pressure 137/68, pulse 103, temperature 102.5 F (39.2 C), temperature source Oral, resp. rate 18, height 5\' 2"  (1.575 m), weight 84.369 kg (186 lb), last menstrual period 08/10/2010, SpO2 98 %.   General: Wd, pleasant, overweight female,  lying in bed, shaking with rigors, husband at bedside.  Psych:  Normal affect and insight, Not Suicidal or Homicidal, Awake Alert, Oriented  X 3.  Neuro:   No F.N deficits, ALL C.Nerves Intact, Strength 5/5 all 4 extremities, Sensation intact all 4 extremities.  ENT:  Ears and Eyes appear Normal, Conjunctivae clear, PER. Moist oral mucosa without erythema or exudates.  Neck:  Supple, No lymphadenopathy appreciated  Respiratory:  Symmetrical chest wall movement, Good air movement bilaterally, CTAB.  Cardiac:  RRR, No Murmurs, no LE edema noted, no JVD.    Abdomen:  Positive bowel sounds, Soft, Non tender, Non distended,  No masses appreciated, Surgical incisions appear to be healing well.  Skin:  No Cyanosis, Normal Skin Turgor, No Skin Rash or Bruise.  Extremities:  Able to move all 4. 5/5 strength in each,  no effusions.  Data Review  CBC  Recent Labs Lab 03/03/15 1015 03/03/15 1019  WBC 10.1 10.3  HGB 12.5 12.3  HCT 38.6 38.3  PLT 165 189  MCV 87.5 87.6  MCH 28.3 28.1  MCHC 32.4 32.1  RDW 13.6 13.6  LYMPHSABS 0.6* 0.5*  MONOABS 0.6 0.5  EOSABS 0.1 0.1  BASOSABS 0.1 0.0    Chemistries   Recent Labs Lab 03/03/15 1019  NA 136  K 4.3  CL 103  CO2 23  GLUCOSE 115*  BUN 9  CREATININE 0.91  CALCIUM 8.6  AST 43*  ALT 32  ALKPHOS 102  BILITOT 0.5     Urinalysis    Component Value Date/Time   COLORURINE YELLOW 03/03/2015 0957   APPEARANCEUR CLEAR 03/03/2015 0957   LABSPEC 1.013 03/03/2015 0957   PHURINE 7.0 03/03/2015 0957   GLUCOSEU NEGATIVE 03/03/2015 0957   HGBUR NEGATIVE 03/03/2015 0957   BILIRUBINUR NEGATIVE 03/03/2015 0957   BILIRUBINUR neg 11/29/2014 1809    KETONESUR NEGATIVE 03/03/2015 0957   PROTEINUR NEGATIVE 03/03/2015 0957   PROTEINUR neg 11/29/2014 1809   UROBILINOGEN 0.2 03/03/2015 0957   UROBILINOGEN 0.2 11/29/2014 1809   NITRITE NEGATIVE 03/03/2015 0957   NITRITE neg 11/29/2014 1809   LEUKOCYTESUR NEGATIVE 03/03/2015 0957    Imaging results:   Dg Chest 2 View  03/02/2015   CLINICAL DATA:  Empyema of the pleura. Status post VATS left lung 02/06/2015. Left-sided chest discomfort since procedure.  EXAM: CHEST  2 VIEW  COMPARISON:  02/11/2015  FINDINGS: Heart size is normal. There is left-sided pleural thickening and elevation of the left hemidiaphragm. However overall there has been improvement in the appearance of left-sided pleural change and aeration within the left lower lobe. There are areas of atelectasis or scarring which persist.  The right lung is clear.  There is no pulmonary edema.  Vascular coil is identified in the left upper quadrant. There are surgical clips in the right upper quadrant.  IMPRESSION: 1. Improved appearance of pleural thickening and lower lobe opacity on the left. 2. Persistent elevation of left hemidiaphragm and mild left lower lobe scarring or atelectasis.   Electronically Signed   By: Nolon Nations M.D.   On: 03/02/2015 08:41   Dg Chest Port 1 View  03/03/2015   CLINICAL DATA:  49 year old with current history of splenic fungal abscess for which she is undergoing antifungal therapy. Patient woke up this morning with tachycardia, fever, chills, nausea and vomiting. Recent chest tube drainage of a large left pleural effusion 02/06/2015.  EXAM: PORTABLE CHEST - 1 VIEW  COMPARISON:  Two-view chest x-ray yesterday, 02/11/2015 and earlier. Visualized lung bases on CT abdomen 02/16/2015.  FINDINGS: Cardiac silhouette upper normal in size, unchanged. Mild elevation of the left hemidiaphragm and small left pleural effusion/pleural  scarring at the left base, unchanged. Pleuroparenchymal scarring involving the left mid  lung, unchanged. Right lung remains clear and there are no new pulmonary parenchymal abnormalities. 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  IMPRESSION: 1. No new/acute cardiopulmonary disease. 2. Stable elevation of the left hemidiaphragm and small left pleural effusion/pleural scarring involving the left lung base. 3. Stable pleuroparenchymal scarring involving the left mid lung.   Electronically Signed   By: Evangeline Dakin M.D.   On: 03/03/2015 11:42    Ct Cta Abd/pel W/cm &/or W/o Cm  03/03/2015   CLINICAL DATA:  Abdominal pain, tachycardia  EXAM: CTA ABDOMEN AND PELVIS WITH CONTRAST  TECHNIQUE: Multidetector CT imaging of the abdomen and pelvis was performed using the standard protocol during bolus administration of intravenous contrast. Multiplanar reconstructed images and MIPs were obtained and reviewed to evaluate the vascular anatomy.  CONTRAST:  149mL OMNIPAQUE IOHEXOL 350 MG/ML SOLN  COMPARISON:  The most recent available comparison exam is performed at [facility] on 02/16/2015.  FINDINGS: Lower chest: Small loculated left hydro pneumothorax is decreased since previously. Curvilinear left lower lobe scarring or atelectasis is reidentified.  Hepatobiliary: Inhomogeneous hepatic enhancement is identified with geographical hypodensity suggesting steatosis but no focal mass. Cholecystectomy clips are noted.  Pancreas: Normal  Spleen: Extensive streak artifact is noted from metallic artifact at the splenic hilum. The previously seen mid splenic fluid collection now measures 6.0 x 3.1 cm, decreased from previously. Trace pericolonic fluid is identified, slightly increased from previously. Other areas of irregular hypo enhancement within the spleen are reidentified, obscured by streak artifact. One of these at the dome of the spleen image 31 series 41 is  subjectively and objectively larger, now 2.1 x 1.6 cm.  Adrenals/Urinary Tract: Adrenal glands are normal. Areas of renal cortical scarring are present bilaterally. Nonobstructing left renal calculi reidentified. No new hydroureteronephrosis.  Stomach/Bowel: No bowel wall thickening or focal segmental dilatation is identified. Normal appendix.  Vascular/Lymphatic: Small retroperitoneal nodes are stable. No new lymphadenopathy. No aortic aneurysm or dissection. No periaortic fluid.  Reproductive: Uterus surgically absent.  Ovaries are unremarkable.  Other: No free air.  Musculoskeletal: No acute osseous abnormality. Bilateral L5 pars interarticularis defects. Sacroiliac periarticular sclerosis reidentified.  Review of the MIP images confirms the above findings.  IMPRESSION: Mixed response with decrease in largest splenic fluid collection but interval enlargement of other intra splenic collections. This could reflect bland necrosis after embolization although infection could appear similar.  Decreased now small left hydro pneumothorax.   Electronically Signed   By: Conchita Paris M.D.   On: 03/03/2015 13:18     My personal review of EKG: Ordered.   Assessment & Plan  Principal Problem:   Sepsis due to undetermined organism Active Problems:   Splenic abscess   Hypothyroidism   Chronic ITP (idiopathic thrombocytopenia)   Sepsis  Sepsis Fever 103.2, pulse rate of 113, relative hypotension on admission. Most likely from Splenic abscesses.  Bld and urine cultures pending. Lactic acid not elevated. Consulted ID.  Started Zosyn and Fluconazole.  Splenic Abscesses Per CT - multiple abscesses some has enlarged, some have decreased. Per ID patient has failed antibiotic therapy and will require definitive treatment. General Surgery Consulted.  Will continue Zosyn and Fluconazole.  Hypothyroidism Continue 75 mcg synthroid daily.    Chronic ITP. Platelets at 189.  Unintentional weight loss with  low albumin.  Patient complains of early satiety. Likely due to on-going illness. Nutrition consult.   Consultants Called:  Infectious Disease (Dr. Linus Salmons), CCS (Dr. Hulen Skains)  Family Communication:   Husband at bedside  Code Status:  Full code  Condition:  Guarded.  Potential Disposition: to home when appropriate.  Unable to determine estimated length of stay at this time.  Time spent in minutes : [address],  PA-C on 03/03/2015 at 3:51 PM Between 7am to 7pm - Pager - [phone] After 7pm go to [email or web address] - password [email or web address] And look for the night coverage person covering me after hours  [facility]

## 2015-03-03 NOTE — ED Notes (Signed)
Per EMS pt is being treated for fungal splenic abscess and woke up this morning with chills and vomiting. Patient tachycardic and febrile and nauseated. EMS administered 4mg  IV zofran with nausea relief. Patient given approximately 735mL of NS IV and HR went from 120 to 102bpm.

## 2015-03-03 NOTE — ED Notes (Signed)
Admitting at bedside 

## 2015-03-03 NOTE — ED Provider Notes (Signed)
Medical screening examination/treatment/procedure(s) were conducted as a shared visit with non-physician practitioner(s) and myself.  I personally evaluated the patient during the encounter.   EKG Interpretation None      Pt is a 49 y.o. F  Female with history of splenic artery embolization and subsequent abscess requiring aspiration with IR and then left sided pleural effusion and empyema requiring thoracentesis and VATS  Who presents emergency department with fevers, vomiting. Denies any chest pain or shortness of breath currently.  Denies abdominal pain. No diarrhea.   Labs show no leukocytosis but she does have predominant neutrophils. Lactate normal. Chest x-ray shows no new acute cardiopulmonary disease. CT of her abdomen shows decrease in the largest splenic fluid collection that there are other fluid collections in the spleen that are now larger that could represent necrosis after embolization but also infection. Infectious disease has been consult and recommends admission for IV antibiotics given she is feeling outpatient treatment. Admitted to medicine.  [name], DO 03/03/15 1531

## 2015-03-03 NOTE — Progress Notes (Signed)
ANTIBIOTIC CONSULT NOTE - INITIAL  Pharmacy Consult for zosyn Indication: rule out sepsis/intra-abd infxn/splenic abscesses  No Known Allergies  Patient Measurements: Height: 5\' 2"  (157.5 cm) Weight: 187 lb 6 oz (84.993 kg) IBW/kg (Calculated) : 50.1  Vital Signs: Temp: 99.8 F (37.7 C) (04/23 1439) Temp Source: Oral (04/23 1439) BP: 112/72 mmHg (04/23 1415) Pulse Rate: 73 (04/23 1415) Intake/Output from previous day:   Intake/Output from this shift: Total I/O In: 1000 [I.V.:1000] Out: 1 [Urine:1]  Labs:  Recent Labs  03/03/15 1015 03/03/15 1019  WBC 10.1 10.3  HGB 12.5 12.3  PLT 165 189  CREATININE  --  0.91   Estimated Creatinine Clearance: 76.5 mL/min (by C-G formula based on Cr of 0.91). No results for input(s): VANCOTROUGH, VANCOPEAK, VANCORANDOM, GENTTROUGH, GENTPEAK, GENTRANDOM, TOBRATROUGH, TOBRAPEAK, TOBRARND, AMIKACINPEAK, AMIKACINTROU, AMIKACIN in the last 72 hours.   Microbiology: Recent Results (from the past 720 hour(s))  Body fluid culture     Status: None   Collection Time: 02/04/15 10:20 AM  Result Value Ref Range Status   Specimen Description PLEURAL LEFT FLUID  Final   Special Requests NONE  Final   Gram Stain   Final    FEW WBC PRESENT, PREDOMINANTLY PMN NO ORGANISMS SEEN Performed at [facility]    Culture   Final    NO GROWTH 3 DAYS Performed at [facility]    Report Status 02/07/2015 FINAL  Final  Surgical pcr screen     Status: Abnormal   Collection Time: 02/06/15 12:21 PM  Result Value Ref Range Status   MRSA, PCR NEGATIVE NEGATIVE Final   Staphylococcus aureus POSITIVE (A) NEGATIVE Final    Comment:        The Xpert SA Assay (FDA approved for NASAL specimens in patients over 49 years of age), is one component of a comprehensive surveillance program.  Test performance has been validated by [facility] for patients greater than or equal to 16 year old. It is not intended to diagnose infection nor  to guide or monitor treatment.   Culture, respiratory (NON-Expectorated)     Status: None   Collection Time: 02/06/15  2:08 PM  Result Value Ref Range Status   Specimen Description BRONCHIAL WASHINGS  Final   Special Requests NONE  Final   Gram Stain   Final    RARE WBC PRESENT, PREDOMINANTLY PMN NO SQUAMOUS EPITHELIAL CELLS SEEN NO ORGANISMS SEEN Performed at [facility]    Culture   Final    NO GROWTH 2 DAYS Performed at [facility]    Report Status 02/09/2015 FINAL  Final  Anaerobic culture     Status: None   Collection Time: 02/06/15  3:25 PM  Result Value Ref Range Status   Specimen Description TISSUE LEFT LUNG  Final   Special Requests PATIENT ON FOLLOWING ZOSYN  Final   Gram Stain   Final    RARE WBC PRESENT,BOTH PMN AND MONONUCLEAR NO SQUAMOUS EPITHELIAL CELLS SEEN NO ORGANISMS SEEN Performed at [facility]    Culture   Final    NO ANAEROBES ISOLATED Performed at [facility]    Report Status 02/11/2015 FINAL  Final  Tissue culture     Status: None   Collection Time: 02/06/15  3:25 PM  Result Value Ref Range Status   Specimen Description TISSUE LEFT LUNG  Final   Special Requests PATIENT ON FOLLOWING ZOSYN  Final   Gram Stain   Final    RARE WBC  PRESENT,BOTH PMN AND MONONUCLEAR NO SQUAMOUS EPITHELIAL CELLS SEEN NO ORGANISMS SEEN Performed at [facility]    Culture   Final    NO GROWTH 3 DAYS Performed at [facility]    Report Status 02/10/2015 FINAL  Final  Anaerobic culture     Status: None   Collection Time: 02/06/15  3:28 PM  Result Value Ref Range Status   Specimen Description ABSCESS LEFT LUNG  Final   Special Requests PATIENT ON FOLLOWING ZOSYN  Final   Gram Stain   Final    NO WBC SEEN NO SQUAMOUS EPITHELIAL CELLS SEEN NO ORGANISMS SEEN Performed at [facility]    Culture   Final    NO ANAEROBES ISOLATED Performed at [facility]    Report Status 02/11/2015 FINAL   Final  Culture, routine-abscess     Status: None   Collection Time: 02/06/15  3:28 PM  Result Value Ref Range Status   Specimen Description ABSCESS LEFT LUNG  Final   Special Requests PATIENT ON FOLLOWING ZOSYN  Final   Gram Stain   Final    NO WBC SEEN NO SQUAMOUS EPITHELIAL CELLS SEEN NO ORGANISMS SEEN Performed at [facility]    Culture   Final    NO GROWTH 3 DAYS Performed at [facility]    Report Status 02/10/2015 FINAL  Final    Medical History: Past Medical History  Diagnosis Date  . Hypocalcemia   . Depression 04/30/2011  . Hypothyroidism 04/30/2011  . History of kidney stones   . Right ureteral stone   . Chronic idiopathic thrombocytopenia     MILD--   HEMATOLOGIST--  DR Marin Olp  . PONV (postoperative nausea and vomiting)   . 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 Renal insufficiency    Assessment: 49 yof admitted with general fatigue/chills, fever to 103.2, vomiting. 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 Given zosyn/vanc x1 in ED. Pharmacy consulted to continue zosyn for intra-abd infxn/splenic abscesses. Afeb currently, LA 1.3, wbc wnl.  4/23 zosyn>> 4/23 vanc x1 4/23 Fluconazole IV (on pta)>>  Goal of Therapy:  Eradication of infection  Plan:  Zosyn 3.375 IV q8h - 4h inf F/u clinical progress, c/s, abx plan, renal funct  Elicia Lamp, PharmD Clinical Pharmacist - Resident Pager [phone] 03/03/2015 3:28 PM

## 2015-03-03 NOTE — ED Notes (Signed)
Attempted report 

## 2015-03-03 NOTE — Consult Note (Signed)
[facility]     Reason for Consult:splenic abscess    Referring Physician: Dr. Carles Collet  Principal Problem:   Sepsis due to undetermined organism Active Problems:   Hypothyroidism   Splenic abscess   Chronic ITP (idiopathic thrombocytopenia)   Sepsis   . docusate sodium  100 mg Oral BID  . enoxaparin (LOVENOX) injection  40 mg Subcutaneous Q24H  . [START ON 03/04/2015] fluconazole (DIFLUCAN) IV  400 mg Intravenous Q24H  . fluconazole (DIFLUCAN) IV  800 mg Intravenous Once  . [START ON 03/04/2015] levothyroxine  75 mcg Oral QAC breakfast  . piperacillin-tazobactam (ZOSYN)  IV  3.375 g Intravenous 3 times per day  . sodium chloride  3 mL Intravenous Q12H  . sodium chloride  3 mL Intravenous Q12H    Recommendations: Continue with zosyn and fluconazole Consider surgery input for splenectomy   Assessment: She has  Splenic abscess that is improving in one area but worsening in other areas concerning for infection, though could be bland necrosis.  She though continues to have a fever, chills, poor po, nv concerning for true abscess and therefore may need splenectomy.    Antibiotics: augmentin and fluconazole at home  HPI: Peggy Thomas is a 49 y.o. female with history of splenic artery aneurysm who underwent coiling in February and then developed abscess and complicated by empyema requiring decortication.  She did have yeast and Diphtheroids on grams stain and culture and treated with zosyn and fluconazole and sent out on fluconazole with Augmentin.  She has continued to have malaise, fever, n,v, chills and came to ED and noted worsening fluid collections in some areas, though improvement in large area.  She did get pneumococcal and meningicoccal vaccine in Feb.     Review of Systems: A comprehensive review of systems was negative.  Past Medical History  Diagnosis Date  . Hypocalcemia   . Depression 04/30/2011  . Hypothyroidism 04/30/2011  . History of  kidney stones   . Right ureteral stone   . Chronic idiopathic thrombocytopenia     MILD--   HEMATOLOGIST--  DR Marin Olp  . PONV (postoperative nausea and vomiting)   . 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 Renal insufficiency     History  Substance Use Topics  . Smoking status: Current Every Day Smoker -- 1.00 packs/day for 30 years    Types: Cigarettes  . Smokeless tobacco: Never Used  . Alcohol Use: Yes     Comment: occasionally/socially    Family History  Problem Relation Age of Onset  . Diabetes Mother     type 2  . Asthma Mother   . Cancer Mother     skin cancer  . Cancer Father     bone  . Mental illness Sister     schizophrenia  . Alcohol abuse Maternal Grandmother   . Stroke Maternal Grandmother   . Aneurysm Maternal Grandmother     brain aneurysm  . Other Maternal Grandfather     black lung  . Alzheimer's disease Paternal Grandmother   . Stroke Maternal Aunt    No Known Allergies  OBJECTIVE: Blood pressure 137/68, pulse 103, temperature 102.5 F (39.2 C), temperature source Oral, resp. rate 18, height 5\' 2"  (1.575 m), weight 186 lb (84.369 kg), last menstrual period 08/10/2010, SpO2 98 %. General: awake, alert, nad Skin: no rashes Lungs: CTA B Cor: RRR Abdomen: soft, nt   Microbiology: No results found for this or any previous  visit (from the past 240 hour(s)).  Scharlene Gloss, [facility] [place] [email or web address] [phone] pager  [phone] cell 03/03/2015, 4:33 PM

## 2015-03-04 DIAGNOSIS — E039 Hypothyroidism, unspecified: Secondary | ICD-10-CM

## 2015-03-04 LAB — BASIC METABOLIC PANEL
Anion gap: 7 (ref 5–15)
BUN: 6 mg/dL (ref 6–23)
CALCIUM: 8.2 mg/dL — AB (ref 8.4–10.5)
CHLORIDE: 106 mmol/L (ref 96–112)
CO2: 26 mmol/L (ref 19–32)
CREATININE: 0.88 mg/dL (ref 0.50–1.10)
GFR calc Af Amer: 89 mL/min — ABNORMAL LOW (ref >90)
GFR calc non Af Amer: 76 mL/min — ABNORMAL LOW (ref >90)
GLUCOSE: 100 mg/dL — AB (ref 70–99)
Potassium: 3.9 mmol/L (ref 3.5–5.1)
Sodium: 139 mmol/L (ref 135–145)

## 2015-03-04 LAB — CBC
HCT: 30.1 % — ABNORMAL LOW (ref 36.0–46.0)
Hemoglobin: 9.6 g/dL — ABNORMAL LOW (ref 12.0–15.0)
MCH: 28.4 pg (ref 26.0–34.0)
MCHC: 31.9 g/dL (ref 30.0–36.0)
MCV: 89.1 fL (ref 78.0–100.0)
Platelets: 162 10*3/uL (ref 150–400)
RBC: 3.38 MIL/uL — AB (ref 3.87–5.11)
RDW: 13.9 % (ref 11.5–15.5)
WBC: 6.3 10*3/uL (ref 4.0–10.5)

## 2015-03-04 LAB — FUNGUS CULTURE W SMEAR: Fungal Smear: NONE SEEN

## 2015-03-04 LAB — HEPATIC FUNCTION PANEL
ALBUMIN: 2.2 g/dL — AB (ref 3.5–5.2)
ALT: 45 U/L — AB (ref 0–35)
AST: 52 U/L — AB (ref 0–37)
Alkaline Phosphatase: 84 U/L (ref 39–117)
BILIRUBIN INDIRECT: 0.2 mg/dL — AB (ref 0.3–0.9)
Bilirubin, Direct: 0.2 mg/dL (ref 0.0–0.5)
TOTAL PROTEIN: 5.8 g/dL — AB (ref 6.0–8.3)
Total Bilirubin: 0.4 mg/dL (ref 0.3–1.2)

## 2015-03-04 NOTE — Progress Notes (Addendum)
   Daily Progress Note  Assessment/Planning: S/p SAA embolization complicated with splenic abscesses, s/p L VATS for empyema   Sx pt feels better with IV abx  Agree that her splenectomy is likely to be challenging  ? percut. drainage followed interval splenectomy: defer to Gen Surgery  Dr. Trula Slade will resume care of patient on Tuesday  Abx per ID   Subjective    Feels better  Objective Filed Vitals:   03/03/15 1700 03/03/15 2100 03/04/15 0457 03/04/15 0801  BP: 125/66 111/60 115/52 127/65  Pulse: 101 61 66 64  Temp: 101.7 F (38.7 C) 98.8 F (37.1 C) 98.7 F (37.1 C) 98.6 F (37 C)  TempSrc: Oral Oral  Oral  Resp: 17 16 17 18   Height:      Weight:      SpO2: 96% 96% 97% 97%    Intake/Output Summary (Last 24 hours) at 03/04/15 0855 Last data filed at 03/04/15 0612  Gross per 24 hour  Intake   1120 ml  Output      1 ml  Net   1119 ml    PULM  CTAB CV  RRR GI  soft, NTND  Laboratory CBC    Component Value Date/Time   WBC 6.3 03/04/2015 0528   WBC 6.3 06/11/2012 1012   HGB 9.6* 03/04/2015 0528   HGB 15.6 06/11/2012 1012   HCT 30.1* 03/04/2015 0528   HCT 44.0 06/11/2012 1012   PLT 162 03/04/2015 0528   PLT 108* 06/11/2012 1012    BMET    Component Value Date/Time   NA 139 03/04/2015 0528   K 3.9 03/04/2015 0528   CL 106 03/04/2015 0528   CO2 26 03/04/2015 0528   GLUCOSE 100* 03/04/2015 0528   BUN 6 03/04/2015 0528   CREATININE 0.88 03/04/2015 0528   CREATININE 0.83 03/31/2014 1044   CALCIUM 8.2* 03/04/2015 0528   GFRNONAA 76* 03/04/2015 0528   GFRAA 89* 03/04/2015 0528    Adele Barthel, MD [facility] Office: [phone] Pager: [phone]  03/04/2015, 8:55 AM

## 2015-03-04 NOTE — Progress Notes (Signed)
[facility] PROGRESS NOTE  Peggy Thomas JULY NLG:[id number] DOB: 1966-06-01 DOA: 03/03/2015 PCP: Penni Homans, MD  Assessment/Plan: Sepsis -Most likely source appears to be splenic abscess -Per ID recommendations patient has been started on Zosyn and fluconazole; will follow their recommendations -Patient currently afebrile and with regular rate after fluid resuscitation. (On admission has a temperature of 103.2 and a heart rate of 113) -Lactic acid within normal limits -Will continue as needed antipyretics and supportive care  Splenic abscess -recurrent -Patient has failed antibiotic therapy and will require definitive treatment involving surgical procedure -[facility] has been consulted and is evaluating patient's condition and future approach (splenectomy) -Will continue current antibiotics as recommended by infectious disease service  Hypothyroidism: -Continue Synthroid  Chronic ITP -No signs of overt bleeding -Platelet count within normal limits -Will monitor platelets trend  Unintentional weight loss with low albumin: Moderate protein calorie malnutrition -Patient complains of early satiety and decreased by mouth intake associated with acute ongoing illness -dietitian has been consulted -will follow their rec's regarding feeding supplements   Code Status: Full code Family Communication: No family at bedside Disposition Plan: To be determine. Remains inpatient for IV antibiotics and further decision/intervention of her splenic abscess   Consultants:  CCS (Dr. Hulen Skains)  Infectious disease (Dr. Linus Salmons)  Procedures:  See below for x-ray reports  Antibiotics/antifungals:  Zosyn 4/23  Fluconazole 4/23  HPI/Subjective: Afebrile; reports some night sweats. No nausea, no vomiting, just mild abdominal discomfort in her left upper quadrant with deep palpation.  Objective: Filed Vitals:   03/04/15 0801  BP: 127/65  Pulse: 64  Temp: 98.6 F (37 C)  Resp: 18     Intake/Output Summary (Last 24 hours) at 03/04/15 1340 Last data filed at 03/04/15 1000  Gross per 24 hour  Intake    760 ml  Output      0 ml  Net    760 ml   Filed Weights   03/03/15 1040 03/03/15 1510  Weight: 84.993 kg (187 lb 6 oz) 84.369 kg (186 lb)    Exam:   General:  Currently afebrile, denies chest pain, denies shortness of breath. Patient endorses some night sweats  Cardiovascular: S1 and S2, no rubs, no gallops, no murmur  Respiratory: Good air movement, no crackles, no wheezing  Abdomen: Soft, no distention, positive bowel sounds; very minimal left upper quadrant discomfort  Musculoskeletal: No edema, no cyanosis or clubbing.  Data Reviewed: Basic Metabolic Panel:  Recent Labs Lab 03/03/15 1019 03/04/15 0528  NA 136 139  K 4.3 3.9  CL 103 106  CO2 23 26  GLUCOSE 115* 100*  BUN 9 6  CREATININE 0.91 0.88  CALCIUM 8.6 8.2*   Liver Function Tests:  Recent Labs Lab 03/03/15 1019 03/04/15 0528  AST 43* 52*  ALT 32 45*  ALKPHOS 102 84  BILITOT 0.5 0.4  PROT 7.1 5.8*  ALBUMIN 2.9* 2.2*   CBC:  Recent Labs Lab 03/03/15 1015 03/03/15 1019 03/04/15 0528  WBC 10.1 10.3 6.3  NEUTROABS 8.8* 9.2*  --   HGB 12.5 12.3 9.6*  HCT 38.6 38.3 30.1*  MCV 87.5 87.6 89.1  PLT 165 189 162    Recent Results (from the past 240 hour(s))  Blood Culture (routine x 2)     Status: None (Preliminary result)   Collection Time: 03/03/15 10:19 AM  Result Value Ref Range Status   Specimen Description BLOOD RIGHT ARM  Final   Special Requests BOTTLES DRAWN AEROBIC AND ANAEROBIC 5CC  Final  Culture   Final           BLOOD CULTURE RECEIVED NO GROWTH TO DATE CULTURE WILL BE HELD FOR 5 DAYS BEFORE ISSUING A FINAL NEGATIVE REPORT Performed at [facility]    Report Status PENDING  Incomplete  Blood Culture (routine x 2)     Status: None (Preliminary result)   Collection Time: 03/03/15 10:26 AM  Result Value Ref Range Status   Specimen Description  BLOOD RIGHT FOREARM  Final   Special Requests BOTTLES DRAWN AEROBIC AND ANAEROBIC 5CC  Final   Culture   Final           BLOOD CULTURE RECEIVED NO GROWTH TO DATE CULTURE WILL BE HELD FOR 5 DAYS BEFORE ISSUING A FINAL NEGATIVE REPORT Performed at [facility]    Report Status PENDING  Incomplete     Studies: Dg Chest Port 1 View  03/03/2015   CLINICAL DATA:  49 year old with current history of splenic fungal abscess for which she is undergoing antifungal therapy. Patient woke up this morning with tachycardia, fever, chills, nausea and vomiting. Recent chest tube drainage of a large left pleural effusion 02/06/2015.  EXAM: PORTABLE CHEST - 1 VIEW  COMPARISON:  Two-view chest x-ray yesterday, 02/11/2015 and earlier. Visualized lung bases on CT abdomen 02/16/2015.  FINDINGS: Cardiac silhouette upper normal in size, unchanged. Mild elevation of the left hemidiaphragm and small left pleural effusion/pleural scarring at the left base, unchanged. Pleuroparenchymal scarring involving the left mid lung, unchanged. Right lung remains clear and there are no new pulmonary parenchymal abnormalities. 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  IMPRESSION: 1. No new/acute cardiopulmonary disease. 2. Stable elevation of the left hemidiaphragm and small left pleural effusion/pleural scarring involving the left lung base. 3. Stable pleuroparenchymal scarring involving the left mid lung.   Electronically Signed   By: Evangeline Dakin M.D.   On: 03/03/2015 11:42   Ct Cta Abd/pel W/cm &/or W/o Cm  03/03/2015   CLINICAL DATA:  Abdominal pain, tachycardia  EXAM: CTA ABDOMEN AND PELVIS WITH CONTRAST  TECHNIQUE: Multidetector CT imaging of the abdomen and pelvis was performed using the standard protocol during bolus administration of intravenous contrast. Multiplanar reconstructed images and MIPs were obtained  and reviewed to evaluate the vascular anatomy.  CONTRAST:  115mL OMNIPAQUE IOHEXOL 350 MG/ML SOLN  COMPARISON:  The most recent available comparison exam is performed at [place] at [facility] on 02/16/2015.  FINDINGS: Lower chest: Small loculated left hydro pneumothorax is decreased since previously. Curvilinear left lower lobe scarring or atelectasis is reidentified.  Hepatobiliary: Inhomogeneous hepatic enhancement is identified with geographical hypodensity suggesting steatosis but no focal mass. Cholecystectomy clips are noted.  Pancreas: Normal  Spleen: Extensive streak artifact is noted from metallic artifact at the splenic hilum. The previously seen mid splenic fluid collection now measures 6.0 x 3.1 cm, decreased from previously. Trace pericolonic fluid is identified, slightly increased from previously. Other areas of irregular hypo enhancement within the spleen are reidentified, obscured by streak artifact. One of these at the dome of the spleen image 31 series 41 is subjectively and objectively larger, now 2.1 x 1.6 cm.  Adrenals/Urinary Tract: Adrenal glands are normal. Areas of renal cortical scarring are present bilaterally. Nonobstructing left renal calculi reidentified. No new hydroureteronephrosis.  Stomach/Bowel: No bowel wall thickening or focal segmental dilatation is identified. Normal appendix.  Vascular/Lymphatic: Small retroperitoneal  nodes are stable. No new lymphadenopathy. No aortic aneurysm or dissection. No periaortic fluid.  Reproductive: Uterus surgically absent.  Ovaries are unremarkable.  Other: No free air.  Musculoskeletal: No acute osseous abnormality. Bilateral L5 pars interarticularis defects. Sacroiliac periarticular sclerosis reidentified.  Review of the MIP images confirms the above findings.  IMPRESSION: Mixed response with decrease in largest splenic fluid collection but interval enlargement of other intra splenic collections. This could reflect bland  necrosis after embolization although infection could appear similar.  Decreased now small left hydro pneumothorax.   Electronically Signed   By: Conchita Paris M.D.   On: 03/03/2015 13:18    Scheduled Meds: . docusate sodium  100 mg Oral BID  . enoxaparin (LOVENOX) injection  40 mg Subcutaneous Q24H  . fluconazole (DIFLUCAN) IV  400 mg Intravenous Q24H  . levothyroxine  75 mcg Oral QAC breakfast  . piperacillin-tazobactam (ZOSYN)  IV  3.375 g Intravenous 3 times per day  . sodium chloride  3 mL Intravenous Q12H  . sodium chloride  3 mL Intravenous Q12H   Continuous Infusions: . sodium chloride 100 mL/hr at 03/03/15 1530    Principal Problem:   Sepsis due to undetermined organism Active Problems:   Hypothyroidism   Splenic abscess   Chronic ITP (idiopathic thrombocytopenia)   Sepsis   Time spent: 30 minutes   Barton Dubois  [facility] Pager [phone]. If 7PM-7AM, please contact night-coverage at [email or web address], password [facility] 03/04/2015, 1:40 PM  LOS: 1 day

## 2015-03-04 NOTE — Progress Notes (Signed)
Subjective: No new complaints  Objective: Vital signs in last 24 hours: Temp:  [98.6 F (37 C)-103.2 F (39.6 C)] 98.6 F (37 C) (04/24 0801) Pulse Rate:  [61-113] 64 (04/24 0801) Resp:  [16-24] 18 (04/24 0801) BP: (93-137)/(52-81) 127/65 mmHg (04/24 0801) SpO2:  [93 %-98 %] 97 % (04/24 0801) Weight:  [84.369 kg (186 lb)-84.993 kg (187 lb 6 oz)] 84.369 kg (186 lb) (04/23 1510)    Intake/Output from previous day: 04/23 0701 - 04/24 0700 In: 1120 [P.O.:120; I.V.:1000] Out: 1 [Urine:1] Intake/Output this shift: Total I/O In: 240 [P.O.:240] Out: -   Abdomen soft with minimal tenderness LUQ  Lab Results:   Recent Labs  03/03/15 1019 03/04/15 0528  WBC 10.3 6.3  HGB 12.3 9.6*  HCT 38.3 30.1*  PLT 189 162   BMET  Recent Labs  03/03/15 1019 03/04/15 0528  NA 136 139  K 4.3 3.9  CL 103 106  CO2 23 26  GLUCOSE 115* 100*  BUN 9 6  CREATININE 0.91 0.88  CALCIUM 8.6 8.2*   PT/INR  Recent Labs  03/03/15 1611  LABPROT 15.6*  INR 1.23   ABG No results for input(s): PHART, HCO3 in the last 72 hours.  Invalid input(s): PCO2, PO2  Studies/Results: Dg Chest Port 1 View  03/03/2015   CLINICAL DATA:  49 year old with current history of splenic fungal abscess for which she is undergoing antifungal therapy. Patient woke up this morning with tachycardia, fever, chills, nausea and vomiting. Recent chest tube drainage of a large left pleural effusion 02/06/2015.  EXAM: PORTABLE CHEST - 1 VIEW  COMPARISON:  Two-view chest x-ray yesterday, 02/11/2015 and earlier. Visualized lung bases on CT abdomen 02/16/2015.  FINDINGS: Cardiac silhouette upper normal in size, unchanged. Mild elevation of the left hemidiaphragm and small left pleural effusion/pleural scarring at the left base, unchanged. Pleuroparenchymal scarring involving the left mid lung, unchanged. Right lung remains clear and there are no new pulmonary parenchymal abnormalities. Splenic artery occlusion coils are  noted in the left upper quadrant of the abdomen related to prior endovascular treatment of a splenic artery aneurysm.  IMPRESSION: 1. No new/acute cardiopulmonary disease. 2. Stable elevation of the left hemidiaphragm and small left pleural effusion/pleural scarring involving the left lung base. 3. Stable pleuroparenchymal scarring involving the left mid lung.   Electronically Signed   By: Evangeline Dakin M.D.   On: 03/03/2015 11:42   Ct Cta Abd/pel W/cm &/or W/o Cm  03/03/2015   CLINICAL DATA:  Abdominal pain, tachycardia  EXAM: CTA ABDOMEN AND PELVIS WITH CONTRAST  TECHNIQUE: Multidetector CT imaging of the abdomen and pelvis was performed using the standard protocol during bolus administration of intravenous contrast. Multiplanar reconstructed images and MIPs were obtained and reviewed to evaluate the vascular anatomy.  CONTRAST:  133mL OMNIPAQUE IOHEXOL 350 MG/ML SOLN  COMPARISON:  The most recent available comparison exam is performed at [place] at [facility] on 02/16/2015.  FINDINGS: Lower chest: Small loculated left hydro pneumothorax is decreased since previously. Curvilinear left lower lobe scarring or atelectasis is reidentified.  Hepatobiliary: Inhomogeneous hepatic enhancement is identified with geographical hypodensity suggesting steatosis but no focal mass. Cholecystectomy clips are noted.  Pancreas: Normal  Spleen: Extensive streak artifact is noted from metallic artifact at the splenic hilum. The previously seen mid splenic fluid collection now measures 6.0 x 3.1 cm, decreased from previously. Trace pericolonic fluid is identified, slightly increased from previously. Other areas of irregular hypo enhancement within the spleen are reidentified, obscured by  streak artifact. One of these at the dome of the spleen image 31 series 41 is subjectively and objectively larger, now 2.1 x 1.6 cm.  Adrenals/Urinary Tract: Adrenal glands are normal. Areas of renal cortical scarring are  present bilaterally. Nonobstructing left renal calculi reidentified. No new hydroureteronephrosis.  Stomach/Bowel: No bowel wall thickening or focal segmental dilatation is identified. Normal appendix.  Vascular/Lymphatic: Small retroperitoneal nodes are stable. No new lymphadenopathy. No aortic aneurysm or dissection. No periaortic fluid.  Reproductive: Uterus surgically absent.  Ovaries are unremarkable.  Other: No free air.  Musculoskeletal: No acute osseous abnormality. Bilateral L5 pars interarticularis defects. Sacroiliac periarticular sclerosis reidentified.  Review of the MIP images confirms the above findings.  IMPRESSION: Mixed response with decrease in largest splenic fluid collection but interval enlargement of other intra splenic collections. This could reflect bland necrosis after embolization although infection could appear similar.  Decreased now small left hydro pneumothorax.   Electronically Signed   By: Conchita Paris M.D.   On: 03/03/2015 13:18    Anti-infectives: Anti-infectives    Start     Dose/Rate Route Frequency Ordered Stop   03/04/15 1530  fluconazole (DIFLUCAN) IVPB 400 mg     400 mg 100 mL/hr over 120 Minutes Intravenous Every 24 hours 03/03/15 1515     03/03/15 1830  piperacillin-tazobactam (ZOSYN) IVPB 3.375 g     3.375 g 12.5 mL/hr over 240 Minutes Intravenous 3 times per day 03/03/15 1530     03/03/15 1530  fluconazole (DIFLUCAN) IVPB 800 mg     800 mg 200 mL/hr over 120 Minutes Intravenous  Once 03/03/15 1515 03/03/15 2009   03/03/15 1015  piperacillin-tazobactam (ZOSYN) IVPB 3.375 g     3.375 g 100 mL/hr over 30 Minutes Intravenous  Once 03/03/15 1001 03/03/15 1102   03/03/15 1015  vancomycin (VANCOCIN) IVPB 1000 mg/200 mL premix     1,000 mg 200 mL/hr over 60 Minutes Intravenous  Once 03/03/15 1001 03/03/15 1150      Assessment/Plan:   Splenic abscess  Suspect she will need splenectomy which may be difficult given prior history.  Will discuss with our  surgeon coming on tomorrow whether or not to have IR place a drain preop  LOS: 1 day    Marianna Cid A 03/04/2015

## 2015-03-05 LAB — URINE CULTURE
COLONY COUNT: NO GROWTH
Culture: NO GROWTH

## 2015-03-05 LAB — HCV INTERPRETATION

## 2015-03-05 MED ORDER — WHITE PETROLATUM GEL
Status: AC
  Administered 2015-03-05: 0.2
  Filled 2015-03-05: qty 1

## 2015-03-05 MED ORDER — LORAZEPAM 2 MG/ML IJ SOLN
0.5000 mg | Freq: Three times a day (TID) | INTRAMUSCULAR | Status: DC | PRN
  Administered 2015-03-05 – 2015-03-06 (×2): 0.5 mg via INTRAVENOUS
  Filled 2015-03-05 (×2): qty 1

## 2015-03-05 NOTE — Progress Notes (Signed)
Patient ID: Peggy Thomas, female   DOB: [birth year], 49 y.o.   MRN: [id number]    Subjective: Pt feels well with no pain right now except pain from her VATS  Objective: Vital signs in last 24 hours: Temp:  [97.9 F (36.6 C)-99.5 F (37.5 C)] 97.9 F (36.6 C) (04/25 0928) Pulse Rate:  [53-68] 60 (04/25 0928) Resp:  [16-18] 16 (04/25 0928) BP: (125-136)/(74-78) 125/74 mmHg (04/25 0928) SpO2:  [96 %-98 %] 98 % (04/25 0928) Weight:  [83.8 kg (184 lb 11.9 oz)] 83.8 kg (184 lb 11.9 oz) (04/24 1826) Last BM Date: 03/04/15 (Stated it was loose; will monitor )  Intake/Output from previous day: 04/24 0701 - 04/25 0700 In: 3130 [P.O.:480; I.V.:2200; IV Piggyback:450] Out: -  Intake/Output this shift: Total I/O In: 120 [P.O.:120] Out: 600 [Urine:600]  PE: Abd: soft, NT, ND, +BS Heart: regular Lungs: CTAB  Lab Results:   Recent Labs  03/03/15 1019 03/04/15 0528  WBC 10.3 6.3  HGB 12.3 9.6*  HCT 38.3 30.1*  PLT 189 162   BMET  Recent Labs  03/03/15 1019 03/04/15 0528  NA 136 139  K 4.3 3.9  CL 103 106  CO2 23 26  GLUCOSE 115* 100*  BUN 9 6  CREATININE 0.91 0.88  CALCIUM 8.6 8.2*   PT/INR  Recent Labs  03/03/15 1611  LABPROT 15.6*  INR 1.23   CMP     Component Value Date/Time   NA 139 03/04/2015 0528   K 3.9 03/04/2015 0528   CL 106 03/04/2015 0528   CO2 26 03/04/2015 0528   GLUCOSE 100* 03/04/2015 0528   BUN 6 03/04/2015 0528   CREATININE 0.88 03/04/2015 0528   CREATININE 0.83 03/31/2014 1044   CALCIUM 8.2* 03/04/2015 0528   PROT 5.8* 03/04/2015 0528   ALBUMIN 2.2* 03/04/2015 0528   AST 52* 03/04/2015 0528   ALT 45* 03/04/2015 0528   ALKPHOS 84 03/04/2015 0528   BILITOT 0.4 03/04/2015 0528   GFRNONAA 76* 03/04/2015 0528   GFRAA 89* 03/04/2015 0528   Lipase     Component Value Date/Time   LIPASE 21 12/29/2014 1641       Studies/Results: Ct Cta Abd/pel W/cm &/or W/o Cm  03/03/2015   CLINICAL DATA:  Abdominal pain, tachycardia  EXAM:  CTA ABDOMEN AND PELVIS WITH CONTRAST  TECHNIQUE: Multidetector CT imaging of the abdomen and pelvis was performed using the standard protocol during bolus administration of intravenous contrast. Multiplanar reconstructed images and MIPs were obtained and reviewed to evaluate the vascular anatomy.  CONTRAST:  151mL OMNIPAQUE IOHEXOL 350 MG/ML SOLN  COMPARISON:  The most recent available comparison exam is performed at [place] at [facility] on 02/16/2015.  FINDINGS: Lower chest: Small loculated left hydro pneumothorax is decreased since previously. Curvilinear left lower lobe scarring or atelectasis is reidentified.  Hepatobiliary: Inhomogeneous hepatic enhancement is identified with geographical hypodensity suggesting steatosis but no focal mass. Cholecystectomy clips are noted.  Pancreas: Normal  Spleen: Extensive streak artifact is noted from metallic artifact at the splenic hilum. The previously seen mid splenic fluid collection now measures 6.0 x 3.1 cm, decreased from previously. Trace pericolonic fluid is identified, slightly increased from previously. Other areas of irregular hypo enhancement within the spleen are reidentified, obscured by streak artifact. One of these at the dome of the spleen image 31 series 41 is subjectively and objectively larger, now 2.1 x 1.6 cm.  Adrenals/Urinary Tract: Adrenal glands are normal. Areas of renal cortical scarring are present  bilaterally. Nonobstructing left renal calculi reidentified. No new hydroureteronephrosis.  Stomach/Bowel: No bowel wall thickening or focal segmental dilatation is identified. Normal appendix.  Vascular/Lymphatic: Small retroperitoneal nodes are stable. No new lymphadenopathy. No aortic aneurysm or dissection. No periaortic fluid.  Reproductive: Uterus surgically absent.  Ovaries are unremarkable.  Other: No free air.  Musculoskeletal: No acute osseous abnormality. Bilateral L5 pars interarticularis defects. Sacroiliac  periarticular sclerosis reidentified.  Review of the MIP images confirms the above findings.  IMPRESSION: Mixed response with decrease in largest splenic fluid collection but interval enlargement of other intra splenic collections. This could reflect bland necrosis after embolization although infection could appear similar.  Decreased now small left hydro pneumothorax.   Electronically Signed   By: Conchita Paris M.D.   On: 03/03/2015 13:18    Anti-infectives: Anti-infectives    Start     Dose/Rate Route Frequency Ordered Stop   03/04/15 1530  fluconazole (DIFLUCAN) IVPB 400 mg     400 mg 100 mL/hr over 120 Minutes Intravenous Every 24 hours 03/03/15 1515     03/03/15 1830  piperacillin-tazobactam (ZOSYN) IVPB 3.375 g     3.375 g 12.5 mL/hr over 240 Minutes Intravenous 3 times per day 03/03/15 1530     03/03/15 1530  fluconazole (DIFLUCAN) IVPB 800 mg     800 mg 200 mL/hr over 120 Minutes Intravenous  Once 03/03/15 1515 03/03/15 2009   03/03/15 1015  piperacillin-tazobactam (ZOSYN) IVPB 3.375 g     3.375 g 100 mL/hr over 30 Minutes Intravenous  Once 03/03/15 1001 03/03/15 1102   03/03/15 1015  vancomycin (VANCOCIN) IVPB 1000 mg/200 mL premix     1,000 mg 200 mL/hr over 60 Minutes Intravenous  Once 03/03/15 1001 03/03/15 1150       Assessment/Plan   1. Splenic abscess, complicated course and history -patient has 2 intra-splenic abscesses.  Fever curve is down and patient currently without pain. -will review CT scan and d/w Dr. Georgette [place] regarding plans for drain vs surgery, etc.  Further recommendations after his evaluation.  LOS: 2 days    Tnia Anglada E 03/05/2015, 11:52 AM Pager: [phone]

## 2015-03-05 NOTE — Progress Notes (Signed)
INITIAL NUTRITION ASSESSMENT  DOCUMENTATION CODES Per approved criteria  -Obesity Unspecified   INTERVENTION: Provide Magic cup TID between meals, each supplement provides 290 kcal and 9 grams of .  Encouraged adequate PO intake.  NUTRITION DIAGNOSIS: Inadequate oral intake related to decreased appetite as evidenced by meal completion of 30-75%.   Goal: Pt to meet >/= 90% of their estimated nutrition needs   Monitor:  PO intake, weight trends, labs, I/O's  Reason for Assessment: MD consult for assessment of nutrition requirements/status  49 y.o. female  Admitting Dx: Sepsis due to undetermined organism  ASSESSMENT: Pt with a pmh of hypothyroidism, kidney stones and chronic ITP. Pt found to have splenic abscesses. Underwent VATS  3/29. Presents with fever and vomiting. She reports losing 10 lbs unintentionally due to early satiety. CT abdomen/pelvis indicates multiple splenic abscesses, some enlarged, and some have diminished since previous CT.  Pt reports having a decreased appetite over the past 1 month since her surgery. Pt reports she has still been eating 3 meals a day, however portions have been smaller. Diet recall: eggs and toast for breakfast, sandwich for lunch, spaghetti or chicken for dinner. Current meal completion has been 30-75%. Pt reports weight loss with usual body weight of 200 lbs. Noted pt with a 8% weight loss in 3 months. Pt was offered Ensure/Boost, however pt refused. Pt is agreeable to Magic cup. RD to order. Pt was encouraged to eat her food at meals. Pt educated to continue supplementation at home to prevent further weight loss.  Pt with no observed significant fat or muscle mass loss.  Labs: Low calcium and GFR. High ALT, AST.  Height: Ht Readings from Last 1 Encounters:  03/03/15 5\' 2"  (1.575 m)    Weight: Wt Readings from Last 1 Encounters:  03/04/15 184 lb 11.9 oz (83.8 kg)    Ideal Body Weight: 110 lbs  % Ideal Body Weight: 167%  Wt  Readings from Last 10 Encounters:  03/04/15 184 lb 11.9 oz (83.8 kg)  02/28/15 187 lb (84.823 kg)  02/19/15 190 lb 4.8 oz (86.32 kg)  02/08/15 199 lb 11.8 oz (90.6 kg)  01/29/15 194 lb 8 oz (88.225 kg)  12/26/14 194 lb (87.998 kg)  11/29/14 200 lb 6 oz (90.89 kg)  11/27/14 192 lb (87.091 kg)  11/15/14 197 lb (89.359 kg)  08/23/14 193 lb 3.2 oz (87.635 kg)    Usual Body Weight: 200 lbs  % Usual Body Weight: 92%  BMI:  Body mass index is 33.78 kg/(m^2). Class I obesity  Estimated Nutritional Needs: Kcal: 1800-2000 Protein: 90-105 grams Fluid: 1.8 - 2 L/day  Skin: Incision on L chest  Diet Order: Diet regular Room service appropriate?: Yes; Fluid consistency:: Thin  EDUCATION NEEDS: -Education needs addressed   Intake/Output Summary (Last 24 hours) at 03/05/15 1208 Last data filed at 03/05/15 1000  Gross per 24 hour  Intake   3010 ml  Output    600 ml  Net   2410 ml    Last BM: 4/24  Labs:   Recent Labs Lab 03/03/15 1019 03/04/15 0528  NA 136 139  K 4.3 3.9  CL 103 106  CO2 23 26  BUN 9 6  CREATININE 0.91 0.88  CALCIUM 8.6 8.2*  GLUCOSE 115* 100*    CBG (last 3)  No results for input(s): GLUCAP in the last 72 hours.  Scheduled Meds: . docusate sodium  100 mg Oral BID  . enoxaparin (LOVENOX) injection  40 mg Subcutaneous Q24H  .  fluconazole (DIFLUCAN) IV  400 mg Intravenous Q24H  . levothyroxine  75 mcg Oral QAC breakfast  . piperacillin-tazobactam (ZOSYN)  IV  3.375 g Intravenous 3 times per day  . sodium chloride  3 mL Intravenous Q12H    Continuous Infusions: . sodium chloride 100 mL/hr at 03/05/15 1121    Past Medical History  Diagnosis Date  . Hypocalcemia   . Depression 04/30/2011  . Hypothyroidism 04/30/2011  . History of kidney stones   . Right ureteral stone   . Chronic idiopathic thrombocytopenia     MILD--   HEMATOLOGIST--  DR Marin Olp  . PONV (postoperative nausea and vomiting)   . 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 Renal  insufficiency     Past Surgical History  Procedure Laterality Date  . Cholecystectomy  2000  . Cesarean section  1996 and 1998  . Tubal ligation  1999  . Knee arthroscopy    . Robotic assisted total hysterectomy  10-15-2010    LAPAROSCOPIC /  LYSIS ADHESIONS  . Extracorporeal shock wave lithotripsy  2003  . Cystoscopy with retrograde pyelogram, ureteroscopy and stent placement Right 07/25/2013    Procedure: CYSTOSCOPY WITH RETROGRADE PYELOGRAM, URETEROSCOPY, STONE EXTRACTION AND POSSIBLE STENT PLACEMENT;  Surgeon: Ailene Rud, MD;  Location: [facility];  Service: Urology;  Laterality: Right;  . Abdominal hysterectomy    . Embolization N/A 12/26/2014    Procedure: EMBOLIZATION;  Surgeon: Serafina Mitchell, MD;  Location: [facility] CATH LAB;  Service: Cardiovascular;  Laterality: N/A;  . Peripheral vascular catheterization  12/26/2014    Procedure: VISCERAL ANGIOGRAPHY;  Surgeon: Serafina Mitchell, MD;  Location: [facility] CATH LAB;  Service: Cardiovascular;;  . Video bronchoscopy N/A 02/06/2015    Procedure: VIDEO BRONCHOSCOPY;  Surgeon: Grace Isaac, MD;  Location: [facility] OR;  Service: Thoracic;  Laterality: N/A;  . Video assisted thoracoscopy (vats)/empyema Left 02/06/2015    Procedure: LEFT VIDEO ASSISTED THORACOSCOPY (VATS)/DRAINAGE OF EMPYEMA;  Surgeon: Grace Isaac, MD;  Location: [facility];  Service: Thoracic;  Laterality: Left;  (L)VATS, DRAINAGE OF EMPYEMA    Kallie Locks, MS, RD, LDN Pager # [phone] After hours/ weekend pager # [phone]

## 2015-03-05 NOTE — Progress Notes (Signed)
[facility] PROGRESS NOTE  Peggy Thomas QJJ:[id number] DOB: December 14, 1965 DOA: 03/03/2015 PCP: Penni Homans, MD  Assessment/Plan: Sepsis -Most likely source appears to be splenic abscess -sepsis features resolved now. -Patient currently afebrile and with regular heart rate after fluid resuscitation. (On admission has a temperature of 103.2 and a heart rate of 113) -Lactic acid within normal limits -Will continue as needed antipyretics and supportive care -per ID rec's will continue zosyn and fluconazole IV  Splenic abscess -recurrent -Patient has failed antibiotic therapy and will require definitive treatment involving surgical procedure -[facility] has been consulted and is evaluating patient's condition and future approach (splenectomy) -Will continue current antibiotics as recommended by infectious disease service  Hypothyroidism: -Continue Synthroid  Chronic ITP -No signs of overt bleeding -Platelet count within normal limits -Will monitor platelets trend  Unintentional weight loss with low albumin: Moderate protein calorie malnutrition -Patient complains of early satiety and decreased by mouth intake associated with acute ongoing illness -dietitian has been consulted -will follow their rec's regarding feeding supplements   Code Status: Full code Family Communication: No family at bedside Disposition Plan: To be determine. Remains inpatient for IV antibiotics and further decision/intervention of her splenic abscess   Consultants:  CCS (Dr. Hulen Skains)  Infectious disease (Dr. Linus Salmons)  Procedures:  See below for x-ray reports  Antibiotics/antifungals:  Zosyn 4/23  Fluconazole 4/23  HPI/Subjective: Afebrile; denies CP, SOB, nausea and vomiting. Patient is anxious and with crying spells when thinking about need for surgery.  Objective: Filed Vitals:   03/05/15 2119  BP: 144/82  Pulse: 48  Temp: 98.9 F (37.2 C)  Resp: 16    Intake/Output Summary (Last 24  hours) at 03/05/15 2316 Last data filed at 03/05/15 2016  Gross per 24 hour  Intake 2276.67 ml  Output   2300 ml  Net -23.33 ml   Filed Weights   03/03/15 1040 03/03/15 1510 03/04/15 1826  Weight: 84.993 kg (187 lb 6 oz) 84.369 kg (186 lb) 83.8 kg (184 lb 11.9 oz)    Exam:   General:  Remains afebrile, denies chest pain, denies shortness of breath. Patient w/o nausea or vomiting. Anxious and overwhelmed with situation and potential need of open surgery  Cardiovascular: S1 and S2, no rubs, no gallops, no murmur  Respiratory: Good air movement, no crackles, no wheezing  Abdomen: Soft, no distention, positive bowel sounds; no pain in her abd  Musculoskeletal: No edema, no cyanosis or clubbing.  Data Reviewed: Basic Metabolic Panel:  Recent Labs Lab 03/03/15 1019 03/04/15 0528  NA 136 139  K 4.3 3.9  CL 103 106  CO2 23 26  GLUCOSE 115* 100*  BUN 9 6  CREATININE 0.91 0.88  CALCIUM 8.6 8.2*   Liver Function Tests:  Recent Labs Lab 03/03/15 1019 03/04/15 0528  AST 43* 52*  ALT 32 45*  ALKPHOS 102 84  BILITOT 0.5 0.4  PROT 7.1 5.8*  ALBUMIN 2.9* 2.2*   CBC:  Recent Labs Lab 03/03/15 1015 03/03/15 1019 03/04/15 0528  WBC 10.1 10.3 6.3  NEUTROABS 8.8* 9.2*  --   HGB 12.5 12.3 9.6*  HCT 38.6 38.3 30.1*  MCV 87.5 87.6 89.1  PLT 165 189 162    Recent Results (from the past 240 hour(s))  Urine culture     Status: None   Collection Time: 03/03/15  9:57 AM  Result Value Ref Range Status   Specimen Description URINE, CLEAN CATCH  Final   Special Requests NONE  Final   Colony Count  NO GROWTH Performed at [facility]   Final   Culture NO GROWTH Performed at [facility]   Final   Report Status 03/05/2015 FINAL  Final  Blood Culture (routine x 2)     Status: None (Preliminary result)   Collection Time: 03/03/15 10:19 AM  Result Value Ref Range Status   Specimen Description BLOOD RIGHT ARM  Final   Special Requests BOTTLES DRAWN  AEROBIC AND ANAEROBIC 5CC  Final   Culture   Final           BLOOD CULTURE RECEIVED NO GROWTH TO DATE CULTURE WILL BE HELD FOR 5 DAYS BEFORE ISSUING A FINAL NEGATIVE REPORT Performed at [facility]    Report Status PENDING  Incomplete  Blood Culture (routine x 2)     Status: None (Preliminary result)   Collection Time: 03/03/15 10:26 AM  Result Value Ref Range Status   Specimen Description BLOOD RIGHT FOREARM  Final   Special Requests BOTTLES DRAWN AEROBIC AND ANAEROBIC 5CC  Final   Culture   Final           BLOOD CULTURE RECEIVED NO GROWTH TO DATE CULTURE WILL BE HELD FOR 5 DAYS BEFORE ISSUING A FINAL NEGATIVE REPORT Performed at [facility]    Report Status PENDING  Incomplete     Studies: No results found.  Scheduled Meds: . docusate sodium  100 mg Oral BID  . enoxaparin (LOVENOX) injection  40 mg Subcutaneous Q24H  . fluconazole (DIFLUCAN) IV  400 mg Intravenous Q24H  . levothyroxine  75 mcg Oral QAC breakfast  . piperacillin-tazobactam (ZOSYN)  IV  3.375 g Intravenous 3 times per day  . sodium chloride  3 mL Intravenous Q12H   Continuous Infusions: . sodium chloride 100 mL/hr at 03/05/15 1623    Principal Problem:   Sepsis due to undetermined organism Active Problems:   Hypothyroidism   Splenic abscess   Chronic ITP (idiopathic thrombocytopenia)   Sepsis   Time spent: 30 minutes   Barton Dubois  [facility] Pager [phone]. If 7PM-7AM, please contact night-coverage at [email or web address], password [facility] 03/05/2015, 11:16 PM  LOS: 2 days

## 2015-03-05 NOTE — Progress Notes (Signed)
ANTIBIOTIC CONSULT NOTE - Follow-up  Pharmacy Consult for zosyn Indication: rule out sepsis/intra-abd infxn/splenic abscesses  No Known Allergies  Patient Measurements: Height: 5\' 2"  (157.5 cm) Weight: 184 lb 11.9 oz (83.8 kg) IBW/kg (Calculated) : 50.1  Vital Signs: Temp: 98.1 F (36.7 C) (04/25 0500) Temp Source: Oral (04/25 0500) BP: 127/75 mmHg (04/25 0500) Pulse Rate: 53 (04/25 0500) Intake/Output from previous day: 04/24 0701 - 04/25 0700 In: 3130 [P.O.:480; I.V.:2200; IV Piggyback:450] Out: -  Intake/Output from this shift:    Labs:  Recent Labs  03/03/15 1015 03/03/15 1019 03/04/15 0528  WBC 10.1 10.3 6.3  HGB 12.5 12.3 9.6*  PLT 165 189 162  CREATININE  --  0.91 0.88   Estimated Creatinine Clearance: 78.5 mL/min (by C-G formula based on Cr of 0.88). No results for input(s): VANCOTROUGH, VANCOPEAK, VANCORANDOM, GENTTROUGH, GENTPEAK, GENTRANDOM, TOBRATROUGH, TOBRAPEAK, TOBRARND, AMIKACINPEAK, AMIKACINTROU, AMIKACIN in the last 72 hours.   Microbiology: Recent Results (from the past 720 hour(s))  Body fluid culture     Status: None   Collection Time: 02/04/15 10:20 AM  Result Value Ref Range Status   Specimen Description PLEURAL LEFT FLUID  Final   Special Requests NONE  Final   Gram Stain   Final    FEW WBC PRESENT, PREDOMINANTLY PMN NO ORGANISMS SEEN Performed at [facility]    Culture   Final    NO GROWTH 3 DAYS Performed at [facility]    Report Status 02/07/2015 FINAL  Final  Surgical pcr screen     Status: Abnormal   Collection Time: 02/06/15 12:21 PM  Result Value Ref Range Status   MRSA, PCR NEGATIVE NEGATIVE Final   Staphylococcus aureus POSITIVE (A) NEGATIVE Final    Comment:        The Xpert SA Assay (FDA approved for NASAL specimens in patients over 60 years of age), is one component of a comprehensive surveillance program.  Test performance has been validated by [facility] for patients greater than or equal  to 73 year old. It is not intended to diagnose infection nor to guide or monitor treatment.   Culture, respiratory (NON-Expectorated)     Status: None   Collection Time: 02/06/15  2:08 PM  Result Value Ref Range Status   Specimen Description BRONCHIAL WASHINGS  Final   Special Requests NONE  Final   Gram Stain   Final    RARE WBC PRESENT, PREDOMINANTLY PMN NO SQUAMOUS EPITHELIAL CELLS SEEN NO ORGANISMS SEEN Performed at [facility]    Culture   Final    NO GROWTH 2 DAYS Performed at [facility]    Report Status 02/09/2015 FINAL  Final  Anaerobic culture     Status: None   Collection Time: 02/06/15  3:25 PM  Result Value Ref Range Status   Specimen Description TISSUE LEFT LUNG  Final   Special Requests PATIENT ON FOLLOWING ZOSYN  Final   Gram Stain   Final    RARE WBC PRESENT,BOTH PMN AND MONONUCLEAR NO SQUAMOUS EPITHELIAL CELLS SEEN NO ORGANISMS SEEN Performed at [facility]    Culture   Final    NO ANAEROBES ISOLATED Performed at [facility]    Report Status 02/11/2015 FINAL  Final  Tissue culture     Status: None   Collection Time: 02/06/15  3:25 PM  Result Value Ref Range Status   Specimen Description TISSUE LEFT LUNG  Final   Special Requests PATIENT ON FOLLOWING ZOSYN  Final  Gram Stain   Final    RARE WBC PRESENT,BOTH PMN AND MONONUCLEAR NO SQUAMOUS EPITHELIAL CELLS SEEN NO ORGANISMS SEEN Performed at [facility]    Culture   Final    NO GROWTH 3 DAYS Performed at [facility]    Report Status 02/10/2015 FINAL  Final  Anaerobic culture     Status: None   Collection Time: 02/06/15  3:28 PM  Result Value Ref Range Status   Specimen Description ABSCESS LEFT LUNG  Final   Special Requests PATIENT ON FOLLOWING ZOSYN  Final   Gram Stain   Final    NO WBC SEEN NO SQUAMOUS EPITHELIAL CELLS SEEN NO ORGANISMS SEEN Performed at [facility]    Culture   Final    NO ANAEROBES ISOLATED Performed at  [facility]    Report Status 02/11/2015 FINAL  Final  Culture, routine-abscess     Status: None   Collection Time: 02/06/15  3:28 PM  Result Value Ref Range Status   Specimen Description ABSCESS LEFT LUNG  Final   Special Requests PATIENT ON FOLLOWING ZOSYN  Final   Gram Stain   Final    NO WBC SEEN NO SQUAMOUS EPITHELIAL CELLS SEEN NO ORGANISMS SEEN Performed at [facility]    Culture   Final    NO GROWTH 3 DAYS Performed at [facility]    Report Status 02/10/2015 FINAL  Final  Urine culture     Status: None   Collection Time: 03/03/15  9:57 AM  Result Value Ref Range Status   Specimen Description URINE, CLEAN CATCH  Final   Special Requests NONE  Final   Colony Count NO GROWTH Performed at [facility]   Final   Culture NO GROWTH Performed at [facility]   Final   Report Status 03/05/2015 FINAL  Final  Blood Culture (routine x 2)     Status: None (Preliminary result)   Collection Time: 03/03/15 10:19 AM  Result Value Ref Range Status   Specimen Description BLOOD RIGHT ARM  Final   Special Requests BOTTLES DRAWN AEROBIC AND ANAEROBIC 5CC  Final   Culture   Final           BLOOD CULTURE RECEIVED NO GROWTH TO DATE CULTURE WILL BE HELD FOR 5 DAYS BEFORE ISSUING A FINAL NEGATIVE REPORT Performed at [facility]    Report Status PENDING  Incomplete  Blood Culture (routine x 2)     Status: None (Preliminary result)   Collection Time: 03/03/15 10:26 AM  Result Value Ref Range Status   Specimen Description BLOOD RIGHT FOREARM  Final   Special Requests BOTTLES DRAWN AEROBIC AND ANAEROBIC 5CC  Final   Culture   Final           BLOOD CULTURE RECEIVED NO GROWTH TO DATE CULTURE WILL BE HELD FOR 5 DAYS BEFORE ISSUING A FINAL NEGATIVE REPORT Performed at [facility]    Report Status PENDING  Incomplete    Medical History: Past Medical History  Diagnosis Date  . Hypocalcemia   . Depression 04/30/2011  .  Hypothyroidism 04/30/2011  . History of kidney stones   . Right ureteral stone   . Chronic idiopathic thrombocytopenia     MILD--   HEMATOLOGIST--  DR Marin Olp  . PONV (postoperative nausea and vomiting)   . 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 Renal insufficiency    Assessment: 31 yof admitted with general fatigue/chills,  fever to 103.2, vomiting. 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 Given zosyn/vanc x1 in ED. Pharmacy consulted to continue zosyn for intra-abd infxn/splenic abscesses. Currently afebrile and last WBC WNL. Renal function remains stable.   4/23 zosyn>> 4/23 vanc x1 4/23 Fluconazole IV (on pta)>>  Goal of Therapy:  Eradication of infection  Plan:  - Continue zosyn 3.375gm IV Q8H (4 hr inf) - F/u renal fxn, C&S, clinical status  *Pharmacy will sign off as no further dose adjustments are anticipated. Thank you for the consult!  Salome Arnt, PharmD, BCPS Pager # [phone] 03/05/2015 8:18 AM

## 2015-03-05 NOTE — Progress Notes (Signed)
Patient ID: Peggy Thomas, female   DOB: [birth year], 49 y.o.   MRN: [id number]      [address]       [place] [address]             [phone]                      LOS: 2 days   Subjective: Patient called office this am to cancel her appointment in thoracic surgery office because was in th hospital, readmitted with fever 103  Objective: Vital signs in last 24 hours: Patient Vitals for the past 24 hrs:  BP Temp Temp src Pulse Resp SpO2 Weight  03/05/15 0928 125/74 mmHg 97.9 F (36.6 C) Oral 60 16 98 % -  03/05/15 0500 127/75 mmHg 98.1 F (36.7 C) Oral (!) 53 16 96 % -  03/04/15 1826 136/78 mmHg 99.5 F (37.5 C) Oral 68 18 98 % 184 lb 11.9 oz (83.8 kg)    Filed Weights   03/03/15 1040 03/03/15 1510 03/04/15 1826  Weight: 187 lb 6 oz (84.993 kg) 186 lb (84.369 kg) 184 lb 11.9 oz (83.8 kg)    Hemodynamic parameters for last 24 hours:    Intake/Output from previous day: 04/24 0701 - 04/25 0700 In: 3130 [P.O.:480; I.V.:2200; IV Piggyback:450] Out: -  Intake/Output this shift: Total I/O In: 120 [P.O.:120] Out: 600 [Urine:600]  Scheduled Meds: . docusate sodium  100 mg Oral BID  . enoxaparin (LOVENOX) injection  40 mg Subcutaneous Q24H  . fluconazole (DIFLUCAN) IV  400 mg Intravenous Q24H  . levothyroxine  75 mcg Oral QAC breakfast  . piperacillin-tazobactam (ZOSYN)  IV  3.375 g Intravenous 3 times per day  . sodium chloride  3 mL Intravenous Q12H   Continuous Infusions: . sodium chloride 100 mL/hr at 03/05/15 1121   PRN Meds:.acetaminophen **OR** acetaminophen, LORazepam, ondansetron **OR** ondansetron (ZOFRAN) IV, traMADol, zolpidem  General appearance: alert, cooperative and appears older than stated age Neurologic: intact Heart: regular rate and rhythm, S1, S2 normal, no murmur, click, rub or gallop Lungs: diminished breath sounds bibasilar Abdomen: soft, non-tender; bowel sounds normal; no masses,  no organomegaly Extremities:  extremities normal, atraumatic, no cyanosis or edema and Homans sign is negative, no sign of DVT Wound: left chest incision well healed with evidence of wound infection  Lab Results: CBC: Recent Labs  03/03/15 1019 03/04/15 0528  WBC 10.3 6.3  HGB 12.3 9.6*  HCT 38.3 30.1*  PLT 189 162   BMET:  Recent Labs  03/03/15 1019 03/04/15 0528  NA 136 139  K 4.3 3.9  CL 103 106  CO2 23 26  GLUCOSE 115* 100*  BUN 9 6  CREATININE 0.91 0.88  CALCIUM 8.6 8.2*    PT/INR:  Recent Labs  03/03/15 1611  LABPROT 15.6*  INR 1.23     Radiology Ct Cta Abd/pel W/cm &/or W/o Cm  03/03/2015   CLINICAL DATA:  Abdominal pain, tachycardia  EXAM: CTA ABDOMEN AND PELVIS WITH CONTRAST  TECHNIQUE: Multidetector CT imaging of the abdomen and pelvis was performed using the standard protocol during bolus administration of intravenous contrast. Multiplanar reconstructed images and MIPs were obtained and reviewed to evaluate the vascular anatomy.  CONTRAST:  114mL OMNIPAQUE IOHEXOL 350 MG/ML SOLN  COMPARISON:  The most recent available comparison exam is performed at [place] at [facility] on 02/16/2015.  FINDINGS: Lower chest: Small loculated left hydro pneumothorax is decreased since previously. Curvilinear left  lower lobe scarring or atelectasis is reidentified.  Hepatobiliary: Inhomogeneous hepatic enhancement is identified with geographical hypodensity suggesting steatosis but no focal mass. Cholecystectomy clips are noted.  Pancreas: Normal  Spleen: Extensive streak artifact is noted from metallic artifact at the splenic hilum. The previously seen mid splenic fluid collection now measures 6.0 x 3.1 cm, decreased from previously. Trace pericolonic fluid is identified, slightly increased from previously. Other areas of irregular hypo enhancement within the spleen are reidentified, obscured by streak artifact. One of these at the dome of the spleen image 31 series 41 is subjectively and  objectively larger, now 2.1 x 1.6 cm.  Adrenals/Urinary Tract: Adrenal glands are normal. Areas of renal cortical scarring are present bilaterally. Nonobstructing left renal calculi reidentified. No new hydroureteronephrosis.  Stomach/Bowel: No bowel wall thickening or focal segmental dilatation is identified. Normal appendix.  Vascular/Lymphatic: Small retroperitoneal nodes are stable. No new lymphadenopathy. No aortic aneurysm or dissection. No periaortic fluid.  Reproductive: Uterus surgically absent.  Ovaries are unremarkable.  Other: No free air.  Musculoskeletal: No acute osseous abnormality. Bilateral L5 pars interarticularis defects. Sacroiliac periarticular sclerosis reidentified.  Review of the MIP images confirms the above findings.  IMPRESSION: Mixed response with decrease in largest splenic fluid collection but interval enlargement of other intra splenic collections. This could reflect bland necrosis after embolization although infection could appear similar.  Decreased now small left hydro pneumothorax.   Electronically Signed   By: Conchita Paris M.D.   On: 03/03/2015 13:18     Assessment/Plan: S/P   left vats for empyema several weeks ago, now recurrent fever ct does not indicated recurrent left empyema, splenectomy is being considered    Grace Isaac MD 03/05/2015 11:46 AM

## 2015-03-06 ENCOUNTER — Ambulatory Visit: Payer: Self-pay | Admitting: Cardiothoracic Surgery

## 2015-03-06 DIAGNOSIS — IMO0001 Reserved for inherently not codable concepts without codable children: Secondary | ICD-10-CM | POA: Insufficient documentation

## 2015-03-06 LAB — CBC
HCT: 33.9 % — ABNORMAL LOW (ref 36.0–46.0)
Hemoglobin: 10.8 g/dL — ABNORMAL LOW (ref 12.0–15.0)
MCH: 27.8 pg (ref 26.0–34.0)
MCHC: 31.9 g/dL (ref 30.0–36.0)
MCV: 87.1 fL (ref 78.0–100.0)
Platelets: 169 10*3/uL (ref 150–400)
RBC: 3.89 MIL/uL (ref 3.87–5.11)
RDW: 13.9 % (ref 11.5–15.5)
WBC: 6.4 10*3/uL (ref 4.0–10.5)

## 2015-03-06 LAB — BASIC METABOLIC PANEL
Anion gap: 8 (ref 5–15)
BUN: <5 mg/dL — ABNORMAL LOW (ref 6–23)
CO2: 26 mmol/L (ref 19–32)
Calcium: 8.8 mg/dL (ref 8.4–10.5)
Chloride: 104 mmol/L (ref 96–112)
Creatinine, Ser: 0.84 mg/dL (ref 0.50–1.10)
GFR calc Af Amer: >90 mL/min (ref >90)
GFR, EST NON AFRICAN AMERICAN: 81 mL/min — AB (ref >90)
Glucose, Bld: 96 mg/dL (ref 70–99)
Potassium: 3.9 mmol/L (ref 3.5–5.1)
Sodium: 138 mmol/L (ref 135–145)

## 2015-03-06 MED ORDER — METRONIDAZOLE 500 MG PO TABS
500.0000 mg | ORAL_TABLET | Freq: Three times a day (TID) | ORAL | Status: DC
  Administered 2015-03-06 – 2015-03-07 (×4): 500 mg via ORAL
  Filled 2015-03-06 (×6): qty 1

## 2015-03-06 MED ORDER — LEVOFLOXACIN 500 MG PO TABS
500.0000 mg | ORAL_TABLET | Freq: Every day | ORAL | Status: DC
  Administered 2015-03-06 – 2015-03-07 (×2): 500 mg via ORAL
  Filled 2015-03-06 (×2): qty 1

## 2015-03-06 NOTE — Progress Notes (Signed)
    [facility] for Infectious Disease  Date of Admission:  03/03/2015  Antibiotics: Zosyn fluconazole  Subjective: Feels better, eating  Objective: Temp:  [97.6 F (36.4 C)-99.6 F (37.6 C)] 99.6 F (37.6 C) (04/26 0934) Pulse Rate:  [48-81] 79 (04/26 0934) Resp:  [16-17] 16 (04/26 0934) BP: (136-144)/(71-82) 136/76 mmHg (04/26 0934) SpO2:  [94 %-100 %] 98 % (04/26 0934) Weight:  [193 lb 9 oz (87.8 kg)] 193 lb 9 oz (87.8 kg) (04/26 0453)  General: awake, alert, nad Skin: no rashes Lungs: CTA B Cor: RRR Abdomen: soft, nt, nd   Lab Results Lab Results  Component Value Date   WBC 6.4 03/06/2015   HGB 10.8* 03/06/2015   HCT 33.9* 03/06/2015   MCV 87.1 03/06/2015   PLT 169 03/06/2015    Lab Results  Component Value Date   CREATININE 0.84 03/06/2015   BUN <5* 03/06/2015   NA 138 03/06/2015   K 3.9 03/06/2015   CL 104 03/06/2015   CO2 26 03/06/2015    Lab Results  Component Value Date   ALT 45* 03/04/2015   AST 52* 03/04/2015   ALKPHOS 84 03/04/2015   BILITOT 0.4 03/04/2015      Microbiology: Recent Results (from the past 240 hour(s))  Urine culture     Status: None   Collection Time: 03/03/15  9:57 AM  Result Value Ref Range Status   Specimen Description URINE, CLEAN CATCH  Final   Special Requests NONE  Final   Colony Count NO GROWTH Performed at [facility]   Final   Culture NO GROWTH Performed at [facility]   Final   Report Status 03/05/2015 FINAL  Final  Blood Culture (routine x 2)     Status: None (Preliminary result)   Collection Time: 03/03/15 10:19 AM  Result Value Ref Range Status   Specimen Description BLOOD RIGHT ARM  Final   Special Requests BOTTLES DRAWN AEROBIC AND ANAEROBIC 5CC  Final   Culture   Final           BLOOD CULTURE RECEIVED NO GROWTH TO DATE CULTURE WILL BE HELD FOR 5 DAYS BEFORE ISSUING A FINAL NEGATIVE REPORT Performed at [facility]    Report Status PENDING  Incomplete  Blood Culture  (routine x 2)     Status: None (Preliminary result)   Collection Time: 03/03/15 10:26 AM  Result Value Ref Range Status   Specimen Description BLOOD RIGHT FOREARM  Final   Special Requests BOTTLES DRAWN AEROBIC AND ANAEROBIC 5CC  Final   Culture   Final           BLOOD CULTURE RECEIVED NO GROWTH TO DATE CULTURE WILL BE HELD FOR 5 DAYS BEFORE ISSUING A FINAL NEGATIVE REPORT Performed at [facility]    Report Status PENDING  Incomplete    Studies/Results: No results found.  Assessment/Plan:  1) splenic abscess - seems to be responding well to IV therapy and ideally could avoid splenectomy.  Did not respond to Augmentin outpatient with fluconazole.  I will change to levaquin and flagyl to take for 1-2 months and reevaluate, watch closely.   Scharlene Gloss, [facility] for Infectious Disease [place] [email or web address] [phone] pager   [phone] cell 03/06/2015, 2:10 PM

## 2015-03-06 NOTE — Progress Notes (Signed)
[facility] PROGRESS NOTE  Peggy Thomas NFA:[id number] DOB: 1966/02/25 DOA: 03/03/2015 PCP: Penni Homans, MD  Assessment/Plan: Sepsis -Most likely source appears to be splenic abscess -sepsis features resolved now. -Patient currently afebrile and with regular heart rate after fluid resuscitation. (On admission has a temperature of 103.2 and a heart rate of 113) -Lactic acid within normal limits -Will continue as needed antipyretics and supportive care -per ID rec's, will change antibiotics to PO (levaquin 500mg  daily and flagyl 500mg  TID)  Splenic abscess -recurrent -after discussing with Surgery and ID, plan of care recommended is to change antibiotics to PO and watch for 1-2 days if no fever, will discharge home on antibiotics for 1-2 months and reevaluate -oral agents to be use are levaquin 500mg  daily and flagyl 500mg  TID -will follow rec's  Hypothyroidism: -Continue Synthroid  Chronic ITP -No signs of overt bleeding -Platelet count within normal limits -Will monitor platelets trend  Unintentional weight loss with low albumin: Moderate protein calorie malnutrition -Patient complains of early satiety and decreased by mouth intake associated with acute ongoing illness -dietitian has been consulted -will follow their rec's regarding feeding supplements   Code Status: Full code Family Communication: No family at bedside Disposition Plan: To be determine. Remains inpatient for IV antibiotics and further decision/intervention of her splenic abscess   Consultants:  CCS (Dr. Hulen Skains)  Infectious disease (Dr. Linus Salmons)  Procedures:  See below for x-ray reports  Antibiotics/antifungals:  Zosyn 4/23  Fluconazole 4/23  HPI/Subjective: Afebrile; denies CP, SOB, nausea and vomiting.patient without acute complaints currently.  Objective: Filed Vitals:   03/06/15 2057  BP: 135/81  Pulse: 58  Temp: 98.7 F (37.1 C)  Resp: 16    Intake/Output Summary (Last  24 hours) at 03/06/15 2249 Last data filed at 03/06/15 1741  Gross per 24 hour  Intake   1537 ml  Output   3100 ml  Net  -1563 ml   Filed Weights   03/04/15 1826 03/06/15 0453 03/06/15 2057  Weight: 83.8 kg (184 lb 11.9 oz) 87.8 kg (193 lb 9 oz) 92.216 kg (203 lb 4.8 oz)    Exam:   General:  Remains afebrile, denies chest pain, denies shortness of breath. Patient is feeling well and feeling rested. No N/V  Cardiovascular: S1 and S2, no rubs, no gallops, no murmur  Respiratory: Good air movement, no crackles, no wheezing  Abdomen: Soft, no distention, positive bowel sounds; no pain in her abd  Musculoskeletal: No edema, no cyanosis or clubbing.  Data Reviewed: Basic Metabolic Panel:  Recent Labs Lab 03/03/15 1019 03/04/15 0528 03/06/15 0758  NA 136 139 138  K 4.3 3.9 3.9  CL 103 106 104  CO2 23 26 26   GLUCOSE 115* 100* 96  BUN 9 6 <5*  CREATININE 0.91 0.88 0.84  CALCIUM 8.6 8.2* 8.8   Liver Function Tests:  Recent Labs Lab 03/03/15 1019 03/04/15 0528  AST 43* 52*  ALT 32 45*  ALKPHOS 102 84  BILITOT 0.5 0.4  PROT 7.1 5.8*  ALBUMIN 2.9* 2.2*   CBC:  Recent Labs Lab 03/03/15 1015 03/03/15 1019 03/04/15 0528 03/06/15 0758  WBC 10.1 10.3 6.3 6.4  NEUTROABS 8.8* 9.2*  --   --   HGB 12.5 12.3 9.6* 10.8*  HCT 38.6 38.3 30.1* 33.9*  MCV 87.5 87.6 89.1 87.1  PLT 165 189 162 169    Recent Results (from the past 240 hour(s))  Urine culture     Status: None   Collection Time: 03/03/15  9:7 AM  Result Value Ref Range Status   Specimen Description URINE, CLEAN CATCH  Final   Special Requests NONE  Final   Colony Count NO GROWTH Performed at [facility]   Final   Culture NO GROWTH Performed at [facility]   Final   Report Status 03/05/2015 FINAL  Final  Blood Culture (routine x 2)     Status: None (Preliminary result)   Collection Time: 03/03/15 10:19 AM  Result Value Ref Range Status   Specimen Description BLOOD RIGHT ARM   Final   Special Requests BOTTLES DRAWN AEROBIC AND ANAEROBIC 5CC  Final   Culture   Final           BLOOD CULTURE RECEIVED NO GROWTH TO DATE CULTURE WILL BE HELD FOR 5 DAYS BEFORE ISSUING A FINAL NEGATIVE REPORT Performed at [facility]    Report Status PENDING  Incomplete  Blood Culture (routine x 2)     Status: None (Preliminary result)   Collection Time: 03/03/15 10:26 AM  Result Value Ref Range Status   Specimen Description BLOOD RIGHT FOREARM  Final   Special Requests BOTTLES DRAWN AEROBIC AND ANAEROBIC 5CC  Final   Culture   Final           BLOOD CULTURE RECEIVED NO GROWTH TO DATE CULTURE WILL BE HELD FOR 5 DAYS BEFORE ISSUING A FINAL NEGATIVE REPORT Performed at [facility]    Report Status PENDING  Incomplete     Studies: No results found.  Scheduled Meds: . docusate sodium  100 mg Oral BID  . enoxaparin (LOVENOX) injection  40 mg Subcutaneous Q24H  . levofloxacin  500 mg Oral Daily  . levothyroxine  75 mcg Oral QAC breakfast  . metroNIDAZOLE  500 mg Oral 3 times per day  . sodium chloride  3 mL Intravenous Q12H   Continuous Infusions: . sodium chloride Stopped (03/06/15 1501)    Principal Problem:   Sepsis due to undetermined organism Active Problems:   Hypothyroidism   Splenic abscess   Chronic ITP (idiopathic thrombocytopenia)   Sepsis   Time spent: 30 minutes   Barton Dubois  [facility] Pager [phone]. If 7PM-7AM, please contact night-coverage at [email or web address], password [facility] 03/06/2015, 10:49 PM  LOS: 3 days

## 2015-03-06 NOTE — Progress Notes (Signed)
Patient ID: Peggy Thomas, female   DOB: [birth year], 49 y.o.   MRN: [id number]    Subjective: Pt feels well today.  Had a little nausea last night, but no pain.  No fevers  Objective: Vital signs in last 24 hours: Temp:  [97.6 F (36.4 C)-99.6 F (37.6 C)] 99.6 F (37.6 C) (04/26 0934) Pulse Rate:  [48-81] 79 (04/26 0934) Resp:  [16-17] 16 (04/26 0934) BP: (136-144)/(71-82) 136/76 mmHg (04/26 0934) SpO2:  [94 %-100 %] 98 % (04/26 0934) Weight:  [87.8 kg (193 lb 9 oz)] 87.8 kg (193 lb 9 oz) (04/26 0453) Last BM Date: 03/04/15  Intake/Output from previous day: 04/25 0701 - 04/26 0700 In: 3558.7 [P.O.:942; I.V.:2266.7; IV Piggyback:350] Out: 4000 [Urine:4000] Intake/Output this shift:    PE: Abd: soft, NT, ND, +BS Heart: regular  Lab Results:   Recent Labs  03/04/15 0528 03/06/15 0758  WBC 6.3 6.4  HGB 9.6* 10.8*  HCT 30.1* 33.9*  PLT 162 169   BMET  Recent Labs  03/04/15 0528 03/06/15 0758  NA 139 138  K 3.9 3.9  CL 106 104  CO2 26 26  GLUCOSE 100* 96  BUN 6 <5*  CREATININE 0.88 0.84  CALCIUM 8.2* 8.8   PT/INR  Recent Labs  03/03/15 1611  LABPROT 15.6*  INR 1.23   CMP     Component Value Date/Time   NA 138 03/06/2015 0758   K 3.9 03/06/2015 0758   CL 104 03/06/2015 0758   CO2 26 03/06/2015 0758   GLUCOSE 96 03/06/2015 0758   BUN <5* 03/06/2015 0758   CREATININE 0.84 03/06/2015 0758   CREATININE 0.83 03/31/2014 1044   CALCIUM 8.8 03/06/2015 0758   PROT 5.8* 03/04/2015 0528   ALBUMIN 2.2* 03/04/2015 0528   AST 52* 03/04/2015 0528   ALT 45* 03/04/2015 0528   ALKPHOS 84 03/04/2015 0528   BILITOT 0.4 03/04/2015 0528   GFRNONAA 81* 03/06/2015 0758   GFRAA >90 03/06/2015 0758   Lipase     Component Value Date/Time   LIPASE 21 12/29/2014 1641       Studies/Results: No results found.  Anti-infectives: Anti-infectives    Start     Dose/Rate Route Frequency Ordered Stop   03/04/15 1530  fluconazole (DIFLUCAN) IVPB 400 mg     400  mg 100 mL/hr over 120 Minutes Intravenous Every 24 hours 03/03/15 1515     03/03/15 1830  piperacillin-tazobactam (ZOSYN) IVPB 3.375 g     3.375 g 12.5 mL/hr over 240 Minutes Intravenous 3 times per day 03/03/15 1530     03/03/15 1530  fluconazole (DIFLUCAN) IVPB 800 mg     800 mg 200 mL/hr over 120 Minutes Intravenous  Once 03/03/15 1515 03/03/15 2009   03/03/15 1015  piperacillin-tazobactam (ZOSYN) IVPB 3.375 g     3.375 g 100 mL/hr over 30 Minutes Intravenous  Once 03/03/15 1001 03/03/15 1102   03/03/15 1015  vancomycin (VANCOCIN) IVPB 1000 mg/200 mL premix     1,000 mg 200 mL/hr over 60 Minutes Intravenous  Once 03/03/15 1001 03/03/15 1150       Assessment/Plan   1. Splenic abscess, complicated course and history -patient continues to improve on IV abx therapy.  Will continue to treat nonoperatively for now.  Would recommend transitioning her to oral abx therapy and make sure she does not have a resurgence of her fever or WBC.  She is on Zosyn and generally we would switch to oral augmentin; however, she was on this  as an outpatient.  I would consider this a failure of augmentin given she spiked fevers and worsened while on it.  She is followed by ID as an outpatient.  May want to discuss with them about an appropriate oral abx to place the patient on.   LOS: 3 days    Ilina Xu E 03/06/2015, 11:37 AM Pager: [phone]

## 2015-03-07 LAB — CBC
HCT: 34.3 % — ABNORMAL LOW (ref 36.0–46.0)
HEMOGLOBIN: 11.1 g/dL — AB (ref 12.0–15.0)
MCH: 28.1 pg (ref 26.0–34.0)
MCHC: 32.4 g/dL (ref 30.0–36.0)
MCV: 86.8 fL (ref 78.0–100.0)
Platelets: 184 10*3/uL (ref 150–400)
RBC: 3.95 MIL/uL (ref 3.87–5.11)
RDW: 13.8 % (ref 11.5–15.5)
WBC: 5.6 10*3/uL (ref 4.0–10.5)

## 2015-03-07 MED ORDER — LEVOFLOXACIN 500 MG PO TABS: 500.0000 mg | ORAL_TABLET | Freq: Every day | ORAL | Status: DC

## 2015-03-07 MED ORDER — ACETAMINOPHEN 500 MG PO TABS: 500.0000 mg | ORAL_TABLET | Freq: Four times a day (QID) | ORAL | Status: DC | PRN

## 2015-03-07 MED ORDER — METRONIDAZOLE 500 MG PO TABS: 500.0000 mg | ORAL_TABLET | Freq: Three times a day (TID) | ORAL | Status: DC

## 2015-03-07 NOTE — Progress Notes (Signed)
Discharge instructions and medications discussed with patient.  Prescriptions given to patient.  All questions answered.  

## 2015-03-07 NOTE — Discharge Summary (Signed)
Physician Discharge Summary  Peggy Thomas CLE:[id number] DOB: 1965-11-17 DOA: 03/03/2015  PCP: Penni Homans, MD  Admit date: 03/03/2015 Discharge date: 03/07/2015  Time spent: 35 minutes  Recommendations for Outpatient Follow-up:  1. Needs to follow up with Dr Hulen Skains in 3 weeks for further evaluation of splenic abscess. She will need repeat Ct scan.  2. Follow up with Dr Tommy Medal for splenic abscess.   Discharge Diagnoses:    Sepsis due to undetermined organism   Splenic abscess   Hypothyroidism   Chronic ITP (idiopathic thrombocytopenia)   Sepsis   Blood poisoning   Discharge Condition: Stable.   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 She presented to the emergency dept today with fever of 103.2 and vomiting. 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 She was subsequently found to have splenic abscesses. She was treated conservatively with antibiotics, but developed empyema. She underwent VATS on 02/06/2015. The fluid grew dipthroids and gram stain was positive for yeast. She was treated with IV zosyn and fluconazole inpatient then discharged on augmentin and fluconazole on April 3. She has been followed closely by Dr. Tommy Medal, and CTS. Today she presents with fever and vomiting. She states she has been having night sweats and rigors since February. She reports losing 10 lbs unintentionally due to early satiety. She complains that she remains fatigued - going to bed early every evening (5-7 pm). He husband notes the surgical site had some drainage and "popped" open. It appears to be healing now.  In the ER she demonstrates a fever of  103.2, WBC is 10.3, pulse rate 103 - 113. She appears toxic with chills and rigors. CT abdomen/pelvis indicates multiple splenic abscesses, some enlarged, and some have diminished since previous CT. ID and CCS have been consulted.  Sepsis -Most likely source appears to be splenic abscess -sepsis features resolved now. -Patient currently afebrile and with regular heart rate after fluid resuscitation. (On admission has a temperature of 103.2 and a heart rate of 113) -Lactic acid within normal limits -per ID rec's, will change antibiotics to PO (levaquin 500mg  daily and flagyl 500mg  TID)  Splenic abscess -recurrent -after discussing with Surgery and ID, plan of care recommended is to change antibiotics to PO and watch for 1-2 days if no fever, will discharge home on antibiotics for 1-2 months and reevaluate -oral agents to be use are levaquin 500mg  daily and flagyl 500mg  TID -patient has remain afebrile, WBC normal. Ok to discharge home today per surgical team.  -needs to follow up with Dr Hulen Skains in 3 weeks for repeat CT scans.   Hypothyroidism: -Continue Synthroid  Chronic ITP -No signs of overt bleeding -Platelet count within normal limits -Will monitor platelets trend  Unintentional weight loss with low albumin: Moderate protein calorie malnutrition -Patient complains of early satiety and decreased by mouth intake associated with acute ongoing illness -dietitian has been consulted -will follow their rec's regarding feeding supplements   Procedures:  none  Consultations:  ID, Dr Linus Salmons  Surgery.   Discharge Exam: Filed Vitals:   03/07/15 1330  BP: 148/70  Pulse: 50  Temp: 98.3  F (36.8 C)  Resp: 18    General: Alert in no distress.  Cardiovascular: S 1, S 2 RRR Respiratory: CTA Abdomen; NT, ND  Discharge Instructions   Discharge Instructions    Diet - low sodium heart healthy    Complete by:  As directed      Increase activity slowly    Complete by:  As  directed           Current Discharge Medication List    START taking these medications   Details  levofloxacin (LEVAQUIN) 500 MG tablet Take 1 tablet (500 mg total) by mouth daily. Qty: 30 tablet, Refills: 1    metroNIDAZOLE (FLAGYL) 500 MG tablet Take 1 tablet (500 mg total) by mouth every 8 (eight) hours. Qty: 84 tablet, Refills: 0      CONTINUE these medications which have CHANGED   Details  acetaminophen (TYLENOL) 500 MG tablet Take 1 tablet (500 mg total) by mouth every 6 (six) hours as needed for moderate pain. Qty: 30 tablet, Refills: 0      CONTINUE these medications which have NOT CHANGED   Details  b complex vitamins tablet Take 1 tablet by mouth as needed (for energy).     levothyroxine (SYNTHROID, LEVOTHROID) 75 MCG tablet TAKE 1 TABLET (75 MCG TOTAL) BY MOUTH DAILY BEFORE BREAKFAST. Qty: 30 tablet, Refills: 2    traMADol (ULTRAM) 50 MG tablet Take 1-2 tablets (50-100 mg total) by mouth every 6 (six) hours as needed (mild pain). Qty: 30 tablet, Refills: 0   Associated Diagnoses: Post-op pain      STOP taking these medications     amoxicillin-clavulanate (AUGMENTIN) 875-125 MG per tablet      fluconazole (DIFLUCAN) 100 MG tablet        No Known Allergies Follow-up Information    Follow up with Judeth Horn, MD In 2 weeks.   Specialty:  General Surgery   Contact information:   [address] [place] [address] [phone]       Follow up with Alcide Evener, MD In 4 weeks.   Specialty:  Infectious Diseases   Contact information:   [address] [place] [address] [phone]        The results of significant diagnostics from this hospitalization (including imaging, microbiology, ancillary and laboratory) are listed below for reference.    Significant Diagnostic Studies: Dg Chest 2 View  03/02/2015   CLINICAL DATA:  Empyema of the pleura. Status post VATS left lung 02/06/2015. Left-sided chest  discomfort since procedure.  EXAM: CHEST  2 VIEW  COMPARISON:  02/11/2015  FINDINGS: Heart size is normal. There is left-sided pleural thickening and elevation of the left hemidiaphragm. However overall there has been improvement in the appearance of left-sided pleural change and aeration within the left lower lobe. There are areas of atelectasis or scarring which persist.  The right lung is clear.  There is no pulmonary edema.  Vascular coil is identified in the left upper quadrant. There are surgical clips in the right upper quadrant.  IMPRESSION: 1. Improved appearance of pleural thickening and lower lobe opacity on the left. 2. Persistent elevation of left hemidiaphragm and mild left lower lobe scarring or atelectasis.   Electronically Signed   By: Nolon Nations M.D.   On: 03/02/2015 08:41   Dg Chest 2 View  02/11/2015   CLINICAL DATA:  Postop from empyema drainage. Left chest tube removal.  EXAM: CHEST  2  VIEW  COMPARISON:  02/10/2015  FINDINGS: Left chest tube has been removed. Small left hydro pneumothorax extending along the left lateral and posterior chest wall remains stable. Left mid and lower lung compressive atelectasis shows no significant change. Right lung remains clear. Heart size is stable. There is also been removal of right jugular central venous catheter since previous study.  IMPRESSION: Stable small loculated left hydropneumothorax following left chest tube removal. Stable left mid and lower lung atelectasis.   Electronically Signed   By: Earle Gell M.D.   On: 02/11/2015 10:35   Dg Chest 2 View  02/10/2015   CLINICAL DATA:  Pleural effusion  EXAM: CHEST  2 VIEW  COMPARISON:  02/09/2015  FINDINGS: Cardiac shadow is at the upper limits of normal in size. A left-sided chest tube is again seen and stable. A second chest tube has been removed in the interval. No pneumothorax is seen. Chronic pleural thickening is noted on the left. A right jugular central line is noted in satisfactory  position. The right lung remains clear.  IMPRESSION: Stable changes in the left lung. Interval removal of 1 chest tube without recurrent pneumothorax.   Electronically Signed   By: Inez Catalina M.D.   On: 02/10/2015 09:21   Ct Chest Wo Contrast  02/05/2015   CLINICAL DATA:  Dyspnea and persistent fevers. Left pleural effusion/ empyema evaluation.  EXAM: CT CHEST WITHOUT CONTRAST  TECHNIQUE: Multidetector CT imaging of the chest was performed following the standard protocol without IV contrast.  COMPARISON:  Chest radiographs earlier today.  Chest CT 02/02/2015.  FINDINGS: Small prevascular lymph nodes measure up to 10 mm in short axis, slightly larger than on the prior study and most likely reactive. No enlarged hilar lymph nodes are identified, however evaluation is limited by lack of IV contrast, particularly on the left. There is mild rightward mediastinal shift. The heart is normal in size. There is no pericardial effusion.  Subsegmental atelectasis is again seen in the medial right upper lobe and right middle lobe. There is a large left pleural effusion, increased in size from the prior CT. The effusion appears mildly loculated at the apex and anteriorly. There is compressive atelectasis resulting in near complete collapse of the left lower lobe, complete collapse of lingula, and partial collapse of the left upper lobe with a small amount of aerated lung at the apex.  The visualized portion of the upper abdomen demonstrate sequelae of prior cholecystectomy and splenic artery embolization. 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 Nonobstructing left renal calculi are noted. No acute osseous abnormality is identified.  IMPRESSION: Large, partially loculated left pleural effusion with left lung compressive atelectasis.   Electronically Signed   By: Logan Bores   On: 02/05/2015 15:33   Ct Abdomen W Contrast  02/16/2015   CLINICAL DATA:  Status post splenic artery  coiling, left chest/abdominal pain, fever. Evaluate for abscess.  EXAM: CT ABDOMEN WITH CONTRAST  TECHNIQUE: Multidetector CT imaging of the abdomen was performed using the standard protocol following bolus administration of intravenous contrast.  CONTRAST:  100 mL Isovue 300 IV  COMPARISON:  CT chest dated 02/05/2015. CTA abdomen dated 02/02/2015 and 01/31/2015.  FINDINGS: Lower chest: Small left hydropneumothorax, partially loculated with mildly thick wall/rim, decreased from prior CT chest. Chest tube/drain present on interval chest radiographs has been removed.  Hepatobiliary: Liver is within normal limits.  Status post cholecystectomy. No intrahepatic or extrahepatic ductal dilatation.  Pancreas: Within normal limits.  Spleen: 7.3 x 4.2 x 5.2 cm rim enhancing fluid collection with mildly thickened rim. A few tiny foci of gas are present (for example, series 3/image 24), possibly related to prior intervention. The collection previously measured 7.7 x 4.1 x 5.7 cm. Overall, this remains of most suggestive of a splenic abscess or seroma.  Adrenals/Urinary Tract: Adrenal glands are within normal limits.  8 mm nonobstructing left lower pole renal calculus. 7 mm nonobstructing left upper pole renal calculus. No hydronephrosis.  Right kidney is within normal limits.  Stomach/Bowel: Stomach and visualized bowel are unremarkable.  Vascular/Lymphatic: No evidence of abdominal aortic aneurysm.  Status post splenic artery aneurysm embolization with associated streak artifact in the left upper abdomen.  Small retroperitoneal lymph nodes which do not meet pathologic CT size criteria.  Other: No abdominal ascites.  Musculoskeletal: Very mild degenerative changes the lower thoracic spine.  IMPRESSION: 7.3 x 4.2 x 5.2 cm rim enhancing fluid collection in the spleen, suspicious for splenic abscess or seroma, mildly decreased from most recent post drainage CT.  Small left hydropneumothorax, decreased from prior CT chest, with  removal of interval chest tube/drain.  Additional stable ancillary findings as above.   Electronically Signed   By: Julian Hy M.D.   On: 02/16/2015 12:00   Dg Chest Port 1 View  03/03/2015   CLINICAL DATA:  49 year old with current history of splenic fungal abscess for which she is undergoing antifungal therapy. Patient woke up this morning with tachycardia, fever, chills, nausea and vomiting. Recent chest tube drainage of a large left pleural effusion 02/06/2015.  EXAM: PORTABLE CHEST - 1 VIEW  COMPARISON:  Two-view chest x-ray yesterday, 02/11/2015 and earlier. Visualized lung bases on CT abdomen 02/16/2015.  FINDINGS: Cardiac silhouette upper normal in size, unchanged. Mild elevation of the left hemidiaphragm and small left pleural effusion/pleural scarring at the left base, unchanged. Pleuroparenchymal scarring involving the left mid lung, unchanged. Right lung remains clear and there are no new pulmonary parenchymal abnormalities. 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  IMPRESSION: 1. No new/acute cardiopulmonary disease. 2. Stable elevation of the left hemidiaphragm and small left pleural effusion/pleural scarring involving the left lung base. 3. Stable pleuroparenchymal scarring involving the left mid lung.   Electronically Signed   By: Evangeline Dakin M.D.   On: 03/03/2015 11:42   Dg Chest Port 1 View  02/09/2015   CLINICAL DATA:  Empyema.  EXAM: PORTABLE CHEST - 1 VIEW  COMPARISON:  02/08/2015 .  FINDINGS: Right IJ line in stable position. Left chest tube in stable position. Mediastinum hilar structures stable. Heart size stable. Persistent mild left pleural thickening. No prominent pleural fluid collection. Persistent left lung infiltrate. No pneumothorax. No acute osseus abnormality .  IMPRESSION: 1. Left chest tube is in stable position. No pneumothorax. No prominent pleural  fluid collection. Mild thickening of the left pleura noted. Right IJ line in stable position. 2. Persistent left lung infiltrate noted, no change .   Electronically Signed   By: Marcello Moores  Register   On: 02/09/2015 08:52   Dg Chest Port 1 View  02/08/2015   CLINICAL DATA:  Postoperative from thoracoscopy for empyema drainage 2 days ago  EXAM: PORTABLE CHEST - 1 VIEW  COMPARISON:  Portable chest x-ray of February 07, 2015  FINDINGS: The lungs are hypoinflated. The right lung is clear. On the left there is persistent  increased density in the mid and lower lung with partial obscuration of the hemidiaphragm. There is no pneumothorax. Two left-sided chest tubes are unchanged in position. The cardiac silhouette is mildly enlarged. The right internal jugular venous catheter tip projects at the level of the cavoatrial junction and is stable.  IMPRESSION: Slight improved aeration of the left lung with persistent parenchymal consolidation and small amount of pleural fluid. There is no pneumothorax. The left-sided chest tubes are stable. The right lung is clear. Mild cardiomegaly without evidence of CHF.   Electronically Signed   By: David  Martinique   On: 02/08/2015 08:04   Dg Chest Port 1 View  02/07/2015   CLINICAL DATA:  History of VATS for drainage of left empyema, followup  EXAM: PORTABLE CHEST - 1 VIEW  COMPARISON:  Portable chest x-ray of 02/06/2015  FINDINGS: Aeration of the left lung has improved slightly. A left chest tube remains with volume loss and pleural and parenchymal opacity at the left lung base laterally. The right lung is clear. The right IJ central venous line is unchanged in position, and cardiomegaly is stable.  IMPRESSION: Improved aeration of the left lung.  Left chest tube remains.   Electronically Signed   By: Ivar Drape M.D.   On: 02/07/2015 08:02   Dg Chest Port 1 View  02/06/2015   CLINICAL DATA:  Status post video-assisted thoracic surgery and internal jugular line placement  EXAM: PORTABLE  CHEST - 1 VIEW  COMPARISON:  Multiple prior studies  FINDINGS: Right internal jugular central line identified with tip to the cavoatrial junction. Cardiac silhouette unchanged but largely obscured by extensive opacity in the left thorax. Two left-sided chest tubes identified. Right lung is clear.  IMPRESSION: Central line as described with extensive opacity over the left thorax as previously seen. Tiny aerated portion of left apex as previously noted, which now demonstrates some opacity postoperatively as well.   Electronically Signed   By: Skipper Cliche M.D.   On: 02/06/2015 17:14   Ct Cta Abd/pel W/cm &/or W/o Cm  03/03/2015   CLINICAL DATA:  Abdominal pain, tachycardia  EXAM: CTA ABDOMEN AND PELVIS WITH CONTRAST  TECHNIQUE: Multidetector CT imaging of the abdomen and pelvis was performed using the standard protocol during bolus administration of intravenous contrast. Multiplanar reconstructed images and MIPs were obtained and reviewed to evaluate the vascular anatomy.  CONTRAST:  162mL OMNIPAQUE IOHEXOL 350 MG/ML SOLN  COMPARISON:  The most recent available comparison exam is performed at [facility] [place] [facility] on 02/16/2015.  FINDINGS: Lower chest: Small loculated left hydro pneumothorax is decreased since previously. Curvilinear left lower lobe scarring or atelectasis is reidentified.  Hepatobiliary: Inhomogeneous hepatic enhancement is identified with geographical hypodensity suggesting steatosis but no focal mass. Cholecystectomy clips are noted.  Pancreas: Normal  Spleen: Extensive streak artifact is noted from metallic artifact at the splenic hilum. The previously seen mid splenic fluid collection now measures 6.0 x 3.1 cm, decreased from previously. Trace pericolonic fluid is identified, slightly increased from previously. Other areas of irregular hypo enhancement within the spleen are reidentified, obscured by streak artifact. One of these at the dome of the spleen image 31  series 41 is subjectively and objectively larger, now 2.1 x 1.6 cm.  Adrenals/Urinary Tract: Adrenal glands are normal. Areas of renal cortical scarring are present bilaterally. Nonobstructing left renal calculi reidentified. No new hydroureteronephrosis.  Stomach/Bowel: No bowel wall thickening or focal segmental dilatation is identified. Normal appendix.  Vascular/Lymphatic: Small retroperitoneal nodes  are stable. No new lymphadenopathy. No aortic aneurysm or dissection. No periaortic fluid.  Reproductive: Uterus surgically absent.  Ovaries are unremarkable.  Other: No free air.  Musculoskeletal: No acute osseous abnormality. Bilateral L5 pars interarticularis defects. Sacroiliac periarticular sclerosis reidentified.  Review of the MIP images confirms the above findings.  IMPRESSION: Mixed response with decrease in largest splenic fluid collection but interval enlargement of other intra splenic collections. This could reflect bland necrosis after embolization although infection could appear similar.  Decreased now small left hydro pneumothorax.   Electronically Signed   By: Conchita Paris M.D.   On: 03/03/2015 13:18    Microbiology: Recent Results (from the past 240 hour(s))  Urine culture     Status: None   Collection Time: 03/03/15  9:57 AM  Result Value Ref Range Status   Specimen Description URINE, CLEAN CATCH  Final   Special Requests NONE  Final   Colony Count NO GROWTH Performed at [facility]   Final   Culture NO GROWTH Performed at [facility]   Final   Report Status 03/05/2015 FINAL  Final  Blood Culture (routine x 2)     Status: None (Preliminary result)   Collection Time: 03/03/15 10:19 AM  Result Value Ref Range Status   Specimen Description BLOOD RIGHT ARM  Final   Special Requests BOTTLES DRAWN AEROBIC AND ANAEROBIC 5CC  Final   Culture   Final           BLOOD CULTURE RECEIVED NO GROWTH TO DATE CULTURE WILL BE HELD FOR 5 DAYS BEFORE ISSUING A FINAL NEGATIVE  REPORT Performed at [facility]    Report Status PENDING  Incomplete  Blood Culture (routine x 2)     Status: None (Preliminary result)   Collection Time: 03/03/15 10:26 AM  Result Value Ref Range Status   Specimen Description BLOOD RIGHT FOREARM  Final   Special Requests BOTTLES DRAWN AEROBIC AND ANAEROBIC 5CC  Final   Culture   Final           BLOOD CULTURE RECEIVED NO GROWTH TO DATE CULTURE WILL BE HELD FOR 5 DAYS BEFORE ISSUING A FINAL NEGATIVE REPORT Performed at [facility]    Report Status PENDING  Incomplete     Labs: Basic Metabolic Panel:  Recent Labs Lab 03/03/15 1019 03/04/15 0528 03/06/15 0758  NA 136 139 138  K 4.3 3.9 3.9  CL 103 106 104  CO2 23 26 26   GLUCOSE 115* 100* 96  BUN 9 6 <5*  CREATININE 0.91 0.88 0.84  CALCIUM 8.6 8.2* 8.8   Liver Function Tests:  Recent Labs Lab 03/03/15 1019 03/04/15 0528  AST 43* 52*  ALT 32 45*  ALKPHOS 102 84  BILITOT 0.5 0.4  PROT 7.1 5.8*  ALBUMIN 2.9* 2.2*   No results for input(s): LIPASE, AMYLASE in the last 168 hours. No results for input(s): AMMONIA in the last 168 hours. CBC:  Recent Labs Lab 03/03/15 1015 03/03/15 1019 03/04/15 0528 03/06/15 0758 03/07/15 1225  WBC 10.1 10.3 6.3 6.4 5.6  NEUTROABS 8.8* 9.2*  --   --   --   HGB 12.5 12.3 9.6* 10.8* 11.1*  HCT 38.6 38.3 30.1* 33.9* 34.3*  MCV 87.5 87.6 89.1 87.1 86.8  PLT 165 189 162 169 184   Cardiac Enzymes: No results for input(s): CKTOTAL, CKMB, CKMBINDEX, TROPONINI in the last 168 hours. BNP: BNP (last 3 results) No results for input(s): BNP in the last 8760 hours.  ProBNP (last 3 results) No results for input(s): PROBNP in the last 8760 hours.  CBG: No results for input(s): GLUCAP in the last 168 hours.     SignedNiel Hummer A  [facility] 03/07/2015, 2:02 PM

## 2015-03-07 NOTE — Progress Notes (Signed)
[facility] Progress Note     Subjective: Pt doing well, no pain or N/V, tolerating solid food.  Ambulating well through the halls.  No SOB.  No other complaints  Objective: Vital signs in last 24 hours: Temp:  [98 F (36.7 C)-98.7 F (37.1 C)] 98.2 F (36.8 C) (04/27 0900) Pulse Rate:  [50-80] 51 (04/27 0900) Resp:  [16-18] 17 (04/27 0900) BP: (127-150)/(70-90) 127/70 mmHg (04/27 0900) SpO2:  [96 %-100 %] 97 % (04/27 0900) Weight:  [92.216 kg (203 lb 4.8 oz)] 92.216 kg (203 lb 4.8 oz) (04/26 2057) Last BM Date: 03/04/15  Intake/Output from previous day: 04/26 0701 - 04/27 0700 In: 775 [P.O.:775] Out: 1400 [Urine:1400] Intake/Output this shift: Total I/O In: -  Out: 2100 [Urine:2100]  PE: Gen:  Alert, NAD, pleasant Card:  RRR, no M/G/R heard Pulm:  CTA, no W/R/R Abd: Soft, NT/ND, +BS, no HSM, incisions C/D/I, drain with minimal sanguinous drainage, no abdominal scars noted   Lab Results:   Recent Labs  03/06/15 0758  WBC 6.4  HGB 10.8*  HCT 33.9*  PLT 169   BMET  Recent Labs  03/06/15 0758  NA 138  K 3.9  CL 104  CO2 26  GLUCOSE 96  BUN <5*  CREATININE 0.84  CALCIUM 8.8   PT/INR No results for input(s): LABPROT, INR in the last 72 hours. CMP     Component Value Date/Time   NA 138 03/06/2015 0758   K 3.9 03/06/2015 0758   CL 104 03/06/2015 0758   CO2 26 03/06/2015 0758   GLUCOSE 96 03/06/2015 0758   BUN <5* 03/06/2015 0758   CREATININE 0.84 03/06/2015 0758   CREATININE 0.83 03/31/2014 1044   CALCIUM 8.8 03/06/2015 0758   PROT 5.8* 03/04/2015 0528   ALBUMIN 2.2* 03/04/2015 0528   AST 52* 03/04/2015 0528   ALT 45* 03/04/2015 0528   ALKPHOS 84 03/04/2015 0528   BILITOT 0.4 03/04/2015 0528   GFRNONAA 81* 03/06/2015 0758   GFRAA >90 03/06/2015 0758   Lipase     Component Value Date/Time   LIPASE 21 12/29/2014 1641       Studies/Results: No results found.  Anti-infectives: Anti-infectives    Start     Dose/Rate  Route Frequency Ordered Stop   03/06/15 1500  levofloxacin (LEVAQUIN) tablet 500 mg     500 mg Oral Daily 03/06/15 1415     03/06/15 1500  metroNIDAZOLE (FLAGYL) tablet 500 mg     500 mg Oral 3 times per day 03/06/15 1415     03/04/15 1530  fluconazole (DIFLUCAN) IVPB 400 mg  Status:  Discontinued     400 mg 100 mL/hr over 120 Minutes Intravenous Every 24 hours 03/03/15 1515 03/06/15 1415   03/03/15 1830  piperacillin-tazobactam (ZOSYN) IVPB 3.375 g  Status:  Discontinued     3.375 g 12.5 mL/hr over 240 Minutes Intravenous 3 times per day 03/03/15 1530 03/06/15 1415   03/03/15 1530  fluconazole (DIFLUCAN) IVPB 800 mg     800 mg 200 mL/hr over 120 Minutes Intravenous  Once 03/03/15 1515 03/03/15 2009   03/03/15 1015  piperacillin-tazobactam (ZOSYN) IVPB 3.375 g     3.375 g 100 mL/hr over 30 Minutes Intravenous  Once 03/03/15 1001 03/03/15 1102   03/03/15 1015  vancomycin (VANCOCIN) IVPB 1000 mg/200 mL premix     1,000 mg 200 mL/hr over 60 Minutes Intravenous  Once 03/03/15 1001 03/03/15 1150       Assessment/Plan HD #5 Splenic abscess,  complicated course and history -Patient continues to improve on IV abx therapy. Will continue to treat nonoperatively for now, hopeful we can avoid splenectomy -ID following and switched her to levaquin and flagyl for 1-2 months then revaluate -Patient has been afebrile, CBC pending -Ambulate and IS -SCD's and lovenox -On regular diet -D/c home if WBC remains normal after switching to orals (?d/c today) -F/u with Dr. Hulen Skains in 2-3 weeks for a recheck, will likely need repeat imaging set up    LOS: 4 days    DORT, [facility] 03/07/2015, 10:25 AM Pager: [phone]

## 2015-03-09 LAB — CULTURE, BLOOD (ROUTINE X 2)
Culture: NO GROWTH
Culture: NO GROWTH

## 2015-03-13 ENCOUNTER — Other Ambulatory Visit: Payer: Self-pay | Admitting: Licensed Clinical Social Worker

## 2015-03-13 ENCOUNTER — Telehealth: Payer: Self-pay | Admitting: Licensed Clinical Social Worker

## 2015-03-13 MED ORDER — PROMETHAZINE HCL 25 MG PO TABS: 25.0000 mg | ORAL_TABLET | Freq: Four times a day (QID) | ORAL | Status: DC | PRN

## 2015-03-13 NOTE — Telephone Encounter (Signed)
zofran 4mg  qid, #120 is fine

## 2015-03-13 NOTE — Telephone Encounter (Signed)
Patient having nausea on antibiotics would like Zofran if possible. Please advise

## 2015-03-15 ENCOUNTER — Ambulatory Visit (INDEPENDENT_AMBULATORY_CARE_PROVIDER_SITE_OTHER): Payer: Managed Care, Other (non HMO) | Admitting: Family Medicine

## 2015-03-15 ENCOUNTER — Encounter: Payer: Self-pay | Admitting: Family Medicine

## 2015-03-15 VITALS — BP 112/78 | HR 81 | Temp 98.2°F | Ht 62.0 in | Wt 184.4 lb

## 2015-03-15 DIAGNOSIS — M25511 Pain in right shoulder: Secondary | ICD-10-CM | POA: Diagnosis not present

## 2015-03-15 DIAGNOSIS — D649 Anemia, unspecified: Secondary | ICD-10-CM

## 2015-03-15 DIAGNOSIS — Z87891 Personal history of nicotine dependence: Secondary | ICD-10-CM | POA: Diagnosis not present

## 2015-03-15 DIAGNOSIS — IMO0001 Reserved for inherently not codable concepts without codable children: Secondary | ICD-10-CM

## 2015-03-15 DIAGNOSIS — I728 Aneurysm of other specified arteries: Secondary | ICD-10-CM

## 2015-03-15 DIAGNOSIS — G8918 Other acute postprocedural pain: Secondary | ICD-10-CM

## 2015-03-15 DIAGNOSIS — R030 Elevated blood-pressure reading, without diagnosis of hypertension: Secondary | ICD-10-CM

## 2015-03-15 MED ORDER — LORAZEPAM 0.5 MG PO TABS
0.2500 mg | ORAL_TABLET | Freq: Two times a day (BID) | ORAL | Status: DC | PRN
Start: 2015-03-15 — End: 2015-06-18

## 2015-03-15 MED ORDER — TRAMADOL HCL 50 MG PO TABS: 50.0000 mg | ORAL_TABLET | Freq: Four times a day (QID) | ORAL | Status: DC | PRN

## 2015-03-15 MED ORDER — LEVOTHYROXINE SODIUM 50 MCG PO TABS: 50.0000 ug | ORAL_TABLET | Freq: Every day | ORAL | Status: DC

## 2015-03-15 MED ORDER — CITALOPRAM HYDROBROMIDE 20 MG PO TABS: 20.0000 mg | ORAL_TABLET | Freq: Every day | ORAL | Status: DC

## 2015-03-15 NOTE — Patient Instructions (Signed)
PROBIOTIC daily like [employer] or [facility] or order online form [place] 10 multistrain probiotic at [email or web address]   Salon pas patches twice daily  Bicipital Tendonitis Bicipital tendonitis refers to redness, soreness, and swelling (inflammation) or irritation of the bicep tendon. The biceps muscle is located between the elbow and shoulder of the inner arm. The tendon heads, similar to pieces of rope, connect the bicep muscle to the shoulder socket. They are called short head and long head tendons. When tendonitis occurs, the long head tendon is inflamed and swollen, and may be thickened or partially torn.  Bicipital tendonitis can occur with other problems as well, such as arthritis in the shoulder or acromioclavicular joints, tears in the tendons, or other rotator cuff problems.  CAUSES  Overuse of of the arms for overhead activities is the major cause of tendonitis. Many athletes, such as swimmers, baseball players, and tennis players are prone to bicipital tendonitis. Jobs that require manual labor or routine chores, especially chores involving overhead activities can result in overuse and tendonitis. SYMPTOMS Symptoms may include:  Pain in and around the front of the shoulder. Pain may be worse with overhead motion.  Pain or aching that radiates down the arm.  Clicking or shifting sensations in the shoulder. DIAGNOSIS Your caregiver may perform the following:  Physical exam and tests of the biceps and shoulder to observe range of motion, strength, and stability.  X-rays or magnetic resonance imaging (MRI) to confirm the diagnosis. In most common cases, these tests are not necessary. Since other problems may exist in the shoulder or rotator cuff, additional tests may be recommended. TREATMENT Treatment may include the following:  Medications  Your caregiver may prescribe over-the-counter pain relievers.  Steroid injections, such as cortisone,  may be recommended. These may help to reduce inflammation and pain.  Physical Therapy - Your caregiver may recommend gentle exercises with the arm. These can help restore strength and range of motion. They may be done at home or with a physical therapist's supervision and input.  Surgery - Arthroscopic or open surgery sometimes is necessary. Surgery may include:  Reattachment or repair of the tendon at the shoulder socket.  Removal of the damaged section of the tendon.  Anchoring the tendon to a different area of the shoulder (tenodesis). HOME CARE INSTRUCTIONS   Avoid overhead motion of the affected arm or any other motion that causes pain.  Take medication for pain as directed. Do not take these for more than 3 weeks, unless directed to do so by your caregiver.  Ice the affected area for 20 minutes at a time, 3-4 times per day. Place a towel on the skin over the painful area and the ice or cold pack over the towel. Do not place ice directly on the skin.  Perform gentle exercises at home as directed. These will increase strength and flexibility. PREVENTION  Modify your activities as much as possible to protect your arm. A physical therapist or sports medicine physician can help you understand options for safe motion.  Avoid repetitive overhead pulling, lifting, reaching, and throwing until your caregiver tells you it is ok to resume these activities. SEEK MEDICAL CARE IF:  Your pain worsens.  You have difficulty moving the affected arm.  You have trouble performing any of the self-care instructions. MAKE SURE YOU:   Understand these instructions.  Will watch your condition.  Will get help right away if you are not doing well or get worse. Document Released:  11/29/2010 Document Revised: 01/19/2012 Document Reviewed: 11/29/2010 ExitCare Patient Information 2015 [place], [place]. This information is not intended to replace advice given to you by your health care provider. Make  sure you discuss any questions you have with your health care provider. com

## 2015-03-15 NOTE — Progress Notes (Signed)
Pre visit review using our clinic review tool, if applicable. No additional management support is needed unless otherwise documented below in the visit note. 

## 2015-03-17 ENCOUNTER — Other Ambulatory Visit: Payer: Self-pay | Admitting: Family Medicine

## 2015-03-19 ENCOUNTER — Telehealth: Payer: Self-pay | Admitting: *Deleted

## 2015-03-19 NOTE — Telephone Encounter (Signed)
Issue taking the Levaquin and Flagyl? Dr. Linus Salmons saw pt in house and made recommendation make that switch. Hopefully she is not drinking alcohol that would be the a good reason she might be more ill now Flagyl cannot be taken with alcohol

## 2015-03-19 NOTE — Telephone Encounter (Signed)
Spoke with pt.  Explained to her Dr. Lucianne Lei Dam's message.  She is planning to take the phenergan with the antibiotics and if needed every 6 hours.  Pt is still planning on coming to the appt on 03/26/15 to see Dr. Tommy Medal.  Mentioned to the pt that she should not drink ETOH.  Also, mentioned that if she is unable to tolerate that a PICC line and IV antibiotics may be needed.

## 2015-03-19 NOTE — Telephone Encounter (Signed)
She needs to take both. Next step would be IV ertapenem.  Zofran, phenergan or something.  May be the splenic abscess itself.  She needs to take these both for about 2 months and will get rescanned.

## 2015-03-19 NOTE — Telephone Encounter (Signed)
Pt wondering about change in antibiotic.  Pt complaining of continuing nausea, vomiting and stomach cramps.  Nausea medication not working.  MD please advise.  Next appt 03/26/15 at 10:45 with Dr. Tommy Medal.

## 2015-03-19 NOTE — Telephone Encounter (Signed)
Langley Gauss lets make sure the patient is taking some  Phenergan 25mg  every six hours as needed for nausea WITH her antibiotics (she should not drive while on these anti nausea meds) Make sure no alcohol being taken. If she cannot tolerate these we will set her up with PICC and try IV invanz

## 2015-03-20 ENCOUNTER — Ambulatory Visit: Payer: Managed Care, Other (non HMO) | Admitting: Family Medicine

## 2015-03-26 ENCOUNTER — Ambulatory Visit (INDEPENDENT_AMBULATORY_CARE_PROVIDER_SITE_OTHER): Payer: Managed Care, Other (non HMO) | Admitting: Infectious Disease

## 2015-03-26 ENCOUNTER — Encounter: Payer: Self-pay | Admitting: Infectious Disease

## 2015-03-26 VITALS — BP 119/83 | HR 65 | Temp 98.7°F | Ht 62.0 in | Wt 182.0 lb

## 2015-03-26 DIAGNOSIS — J869 Pyothorax without fistula: Secondary | ICD-10-CM | POA: Diagnosis not present

## 2015-03-26 DIAGNOSIS — D693 Immune thrombocytopenic purpura: Secondary | ICD-10-CM | POA: Diagnosis not present

## 2015-03-26 DIAGNOSIS — D733 Abscess of spleen: Secondary | ICD-10-CM

## 2015-03-26 DIAGNOSIS — R110 Nausea: Secondary | ICD-10-CM | POA: Insufficient documentation

## 2015-03-26 DIAGNOSIS — A419 Sepsis, unspecified organism: Secondary | ICD-10-CM

## 2015-03-26 DIAGNOSIS — I728 Aneurysm of other specified arteries: Secondary | ICD-10-CM

## 2015-03-26 HISTORY — DX: Nausea: R11.0

## 2015-03-26 MED ORDER — LEVOFLOXACIN 500 MG PO TABS: 500.0000 mg | ORAL_TABLET | Freq: Every day | ORAL | Status: DC

## 2015-03-26 MED ORDER — PROMETHAZINE HCL 25 MG PO TABS: 25.0000 mg | ORAL_TABLET | Freq: Four times a day (QID) | ORAL | Status: DC | PRN

## 2015-03-26 MED ORDER — METRONIDAZOLE 500 MG PO TABS: 500.0000 mg | ORAL_TABLET | Freq: Three times a day (TID) | ORAL | Status: DC

## 2015-03-26 NOTE — Progress Notes (Signed)
Subjective:    Patient ID: Peggy Thomas, female    DOB: 1966/01/13, 49 y.o.   MRN: [id number]  HPI   49 y.o. 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 She been on very broad-spectrum antibiotics but cultures had been unrevealing with the one from her splenic abscess on Gram stain showing moderate gram-positive rods and rare yeast. She's been narrowed to Unasyn + fluconazole. Since I last saw her she underwent surgery Dr. Servando Snare Bronchoscopy, left video-assisted thoracoscopy,mini thoracotomy, drainage of empyema, and decortication, he was also only discharged on oral Augmentin has had a repeat CT of the abdomen and pelvis showed  that her splenic abscess is only slightly diminished in size.  When I last saw her she was still having night sweats every night, though they were less bad than when she was first hospitalized.  Approximately 3 days after I saw her the patient on 03/03/15  With fevers and abdominal pain  Repeat CT scan showed:  The previously seen mid splenic fluid collection now measures 6.0 x 3.1 cm, decreased from previously. Trace pericolonic fluid is identified, slightly increased from previously. Other areas of irregular hypo enhancement within the spleen are reidentified, obscured by streak artifact. One of these at the dome of the spleen image 31 series 41 is subjectively and objectively larger, now 2.1 x 1.6 cm.  She was treated with IV zosyn and fluconazole and then eventually transitioned to oral levaquin and flagyl orally.  She has had a great deal of difficutly tolerating the oral metronidazole in particular.  Only now that she is taking phenergan TID has she been better though now she is noticing light  headedness with standing too rapidly.  No further fevers, still abdominal pain in left side.     Review of Systems  Constitutional: Positive for fatigue. Negative for fever, chills, diaphoresis, activity change, appetite change and unexpected weight change.  HENT: Negative for congestion, rhinorrhea, sinus pressure, sneezing, sore throat and trouble swallowing.   Eyes: Negative for photophobia and visual disturbance.  Respiratory: Negative for cough, chest tightness, shortness of breath, wheezing and stridor.   Cardiovascular: Negative for chest pain, palpitations and leg swelling.  Gastrointestinal: Positive for nausea and abdominal pain. Negative for vomiting, diarrhea, constipation, blood in stool, abdominal distention and anal bleeding.  Genitourinary: Negative for dysuria, hematuria and difficulty urinating.  Musculoskeletal: Negative for myalgias, back pain, joint swelling, arthralgias and gait problem.  Skin: Negative for color change, pallor, rash and wound.  Neurological: Negative for dizziness, tremors, weakness and light-headedness.  Hematological: Negative for adenopathy. Does not bruise/bleed easily.  Psychiatric/Behavioral: Negative for behavioral problems, confusion, sleep disturbance, dysphoric mood, decreased concentration and agitation.       Objective:   Physical Exam  Constitutional: She is oriented to person, place, and time. She appears well-developed and well-nourished. No distress.  HENT:  Head: Normocephalic and atraumatic.  Mouth/Throat: No oropharyngeal exudate.  Eyes: Conjunctivae and EOM are normal. No scleral icterus.  Neck: Normal range of motion. Neck supple.  Cardiovascular: Normal rate, regular rhythm and normal heart sounds.  Exam reveals no gallop and no friction rub.   No murmur heard. Pulmonary/Chest: Effort normal. No respiratory distress. She has decreased breath sounds in the left middle field and the left lower  field. She has no wheezes.    Abdominal: Soft. Bowel sounds are normal. She exhibits no distension. There is tenderness.  Musculoskeletal: She exhibits no edema or tenderness.  Neurological: She is alert and oriented to person, place, and time. She exhibits normal muscle tone. Coordination normal.  Skin: Skin is warm and dry. No rash noted. She is not diaphoretic. No erythema. No pallor.  Psychiatric: She has a normal mood and affect. Her behavior is normal. Judgment and thought content normal.   Scars from her CT surgery are well-healed        Assessment & Plan:   Splenic abscess:   Continue oral levaquin and flagyl for now  She is to have repeat CT of her abdomen in July but may require one before then , certainly IF she has any worsening of symptoms  I still worry that she is going to need a splenectomy and surgical clean up of this space  She has had important vaccines pre-potential splenectomy.  I spent greater than 25 minutes with the patient including greater than 50% of time in face to face counsel of the patient Regarding the nature of her infection and management going forward including protracted oral antibiotics and repeat imaging.and in coordination of their care.  Empyema: status post cardiothoracic surgery: she follows with  Dr. Servando Snare   Nausea due to flagyl: if this becomes a bigger problem consider change her to IV invanz vs merrem

## 2015-03-29 ENCOUNTER — Ambulatory Visit (INDEPENDENT_AMBULATORY_CARE_PROVIDER_SITE_OTHER): Payer: Managed Care, Other (non HMO) | Admitting: Family Medicine

## 2015-03-29 DIAGNOSIS — M25511 Pain in right shoulder: Secondary | ICD-10-CM

## 2015-03-29 MED ORDER — DICLOFENAC SODIUM 75 MG PO TBEC: 75.0000 mg | DELAYED_RELEASE_TABLET | Freq: Two times a day (BID) | ORAL | Status: DC

## 2015-03-29 NOTE — Patient Instructions (Signed)
You have rotator cuff impingement Try to avoid painful activities (overhead activities, lifting with extended arm) as much as possible. Diclofenac 75mg  twice a day with food for pain and inflammation. Can take tylenol in addition to this. Subacromial injection may be beneficial to help with pain and to decrease inflammation. Consider physical therapy with transition to home exercise program. Do home exercise program with theraband and scapular stabilization exercises daily - these are very important for long term relief even if an injection was given.  3 sets of 10 once a day. If not improving at follow-up we will consider further imaging, injection, physical therapy, and/or nitro patches. Follow up with me in 6 weeks.

## 2015-03-30 DIAGNOSIS — M25511 Pain in right shoulder: Secondary | ICD-10-CM | POA: Insufficient documentation

## 2015-03-30 NOTE — Progress Notes (Signed)
PCP and referred by: Penni Homans, MD  Subjective:   HPI: Patient is a 49 y.o. female here for right shoulder pain.  Patient reports shoulder pain started back in October 2015. No known injury or trauma. 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 + night pain. Worse lying on right side. Worse reaching and going overhead. Has tried icy hot, deep blue, heat, aleve. Is right handed.  Past Medical History  Diagnosis Date  . Hypocalcemia   . Depression 04/30/2011  . Hypothyroidism 04/30/2011  . History of kidney stones   . Right ureteral stone   . Chronic idiopathic thrombocytopenia     MILD--   HEMATOLOGIST--  DR Marin Olp  . PONV (postoperative nausea and vomiting)   . 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 Renal insufficiency   . Nausea without vomiting 03/26/2015    Current Outpatient Prescriptions on File Prior to Visit  Medication Sig Dispense Refill  . acetaminophen (TYLENOL) 500 MG tablet Take 1 tablet (500 mg total) by mouth every 6 (six) hours as needed for moderate pain. 30 tablet 0  . b complex vitamins tablet Take 1 tablet by mouth as needed (for energy).     . citalopram (CELEXA) 20 MG tablet Take 1 tablet (20 mg total) by mouth daily. 30 tablet 3  . levofloxacin (LEVAQUIN) 500 MG tablet Take 1 tablet (500 mg total) by mouth daily. 30 tablet 6  . levothyroxine (SYNTHROID, LEVOTHROID) 75 MCG tablet TAKE 1 TABLET (75 MCG TOTAL) BY MOUTH DAILY BEFORE BREAKFAST. 30 tablet 6  . LORazepam (ATIVAN) 0.5 MG tablet Take 0.5-1 tablets (0.25-0.5 mg total) by mouth 2 (two) times daily as needed for anxiety. 30 tablet 1  . metroNIDAZOLE (FLAGYL) 500 MG tablet Take 1 tablet (500 mg total) by mouth every 8 (eight) hours. 90 tablet 6  . ondansetron (ZOFRAN) 4 MG tablet     . promethazine (PHENERGAN) 25 MG tablet Take 1 tablet (25 mg total) by mouth every 6 (six) hours as needed for nausea or vomiting. 120 tablet 6  . traMADol (ULTRAM) 50 MG tablet  Take 1-2 tablets (50-100 mg total) by mouth every 6 (six) hours as needed (mild pain). 30 tablet 0   No current facility-administered medications on file prior to visit.    Past Surgical History  Procedure Laterality Date  . Cholecystectomy  2000  . Cesarean section  1996 and 1998  . Tubal ligation  1999  . Knee arthroscopy    . Robotic assisted total hysterectomy  10-15-2010    LAPAROSCOPIC /  LYSIS ADHESIONS  . Extracorporeal shock wave lithotripsy  2003  . Cystoscopy with retrograde pyelogram, ureteroscopy and stent placement Right 07/25/2013    Procedure: CYSTOSCOPY WITH RETROGRADE PYELOGRAM, URETEROSCOPY, STONE EXTRACTION AND POSSIBLE STENT PLACEMENT;  Surgeon: Ailene Rud, MD;  Location: [facility];  Service: Urology;  Laterality: Right;  . Abdominal hysterectomy    . Embolization N/A 12/26/2014    Procedure: EMBOLIZATION;  Surgeon: Serafina Mitchell, MD;  Location: [facility] CATH LAB;  Service: Cardiovascular;  Laterality: N/A;  . Peripheral vascular catheterization  12/26/2014    Procedure: VISCERAL ANGIOGRAPHY;  Surgeon: Serafina Mitchell, MD;  Location[facility] CATH LAB;  Service: Cardiovascular;;  . Video bronchoscopy N/A 02/06/2015    Procedure: VIDEO BRONCHOSCOPY;  Surgeon: Grace Isaac, MD;  Location: [facility] OR;  Service: Thoracic;  Laterality: N/A;  . Video assisted thoracoscopy (vats)/empyema Left  02/06/2015    Procedure: LEFT VIDEO ASSISTED THORACOSCOPY (VATS)/DRAINAGE OF EMPYEMA;  Surgeon: Grace Isaac, MD;  Location: [facility] OR;  Service: Thoracic;  Laterality: Left;  (L)VATS, DRAINAGE OF EMPYEMA    No Known Allergies  History   Social History  . Marital Status: Married    Spouse Name: N/A  . Number of Children: N/A  . Years of Education: N/A   Occupational History  . Not on file.   Social History Main Topics  . Smoking status: Former Smoker -- 1.00 packs/day for 30 years    Types: Cigarettes  . Smokeless tobacco: Never Used  . Alcohol Use: 0.0  oz/week    0 Standard drinks or equivalent per week     Comment: occasionally/socially  . Drug Use: No  . Sexual Activity:    Partners: Male    Birth Control/ Protection: Surgical     Comment: TLH/LOA   Other Topics Concern  . Not on file   Social History Narrative    Family History  Problem Relation Age of Onset  . Diabetes Mother     type 2  . Asthma Mother   . Cancer Mother     skin cancer  . Cancer Father     bone  . Mental illness Sister     schizophrenia  . Alcohol abuse Maternal Grandmother   . Stroke Maternal Grandmother   . Aneurysm Maternal Grandmother     brain aneurysm  . Other Maternal Grandfather     black lung  . Alzheimer's disease Paternal Grandmother   . Stroke Maternal Aunt     LMP 08/10/2010  Review of Systems: See HPI above.    Objective:  Physical Exam:  Gen: NAD  Right shoulder: No swelling, ecchymoses.  No gross deformity. No TTP. FROM with painful arc. Positive Hawkins, Neers. Negative Speeds, Yergasons. Strength 5/5 with empty can and resisted internal/external rotation.  Pain empty can and ER. Negative apprehension. NV intact distally.    Assessment & Plan:  1. Right shoulder pain - 2/2 rotator cuff impingement.  Start diclofenac twice a day for pain and inflammation.  Shown home exercises to do daily.  Consider injection, PT, nitro patches, imaging if not improving.  F/u in 6 weeks.

## 2015-03-30 NOTE — Assessment & Plan Note (Signed)
2/2 rotator cuff impingement.  Start diclofenac twice a day for pain and inflammation.  Shown home exercises to do daily.  Consider injection, PT, nitro patches, imaging if not improving.  F/u in 6 weeks.

## 2015-03-31 ENCOUNTER — Encounter: Payer: Self-pay | Admitting: Family Medicine

## 2015-03-31 DIAGNOSIS — D649 Anemia, unspecified: Secondary | ICD-10-CM

## 2015-03-31 HISTORY — DX: Anemia, unspecified: D64.9

## 2015-03-31 NOTE — Assessment & Plan Note (Signed)
Had surgery to repair and a blood clot developed with abscess, has been hospitalized and now has follow up with ID and vascular

## 2015-03-31 NOTE — Assessment & Plan Note (Signed)
Well controlled. Encouraged heart healthy diet such as the DASH diet and exercise as tolerated.  

## 2015-03-31 NOTE — Assessment & Plan Note (Signed)
On Levothyroxine, continue to monitor 

## 2015-03-31 NOTE — Assessment & Plan Note (Signed)
Has been struggling with shoulder pain intermittently since October. Will refer to sports medicine fr further consideration.

## 2015-03-31 NOTE — Assessment & Plan Note (Signed)
Improving s/p hospitalization, Increase leafy greens, consider increased lean red meat and using cast iron cookware. Continue to monitor, report any concerns

## 2015-03-31 NOTE — Progress Notes (Signed)
Peggy Thomas  [id number] 1966/03/27 03/31/2015      Progress Note-Follow Up  Subjective  Chief Complaint  Chief Complaint  Patient presents with  . Follow-up    HPI  Patient is a 49 y.o. female in today for routine medical care. Patient is in today for follow-up after serious illness and hospitalization. She was having splenic artery aneurysm work done and unfortunately developed an abscess and sepsis. She's been very ill and getting out of the hospital. At present she says for the last 2 days she's felt better than she has in quite some time. She's had no fevers, nausea, vomiting for the last several days. Continues to struggle with malaise and some anorexia. Her other complaint is of right shoulder pain. This been going on since October but is recently become more steady. Worse with movement. Denies CP/palp/SOB/HA/congestion/fevers/GI or GU c/o. Taking meds as prescribed  Past Medical History  Diagnosis Date  . Hypocalcemia   . Depression 04/30/2011  . Hypothyroidism 04/30/2011  . History of kidney stones   . Right ureteral stone   . Chronic idiopathic thrombocytopenia     MILD--   HEMATOLOGIST--  DR Marin Olp  . PONV (postoperative nausea and vomiting)   . 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 Renal insufficiency   . Nausea without vomiting 03/26/2015  . History of tobacco abuse 04/30/2011  . Anemia 03/31/2015    Past Surgical History  Procedure Laterality Date  . Cholecystectomy  2000  . Cesarean section  1996 and 1998  . Tubal ligation  1999  . Knee arthroscopy    . Robotic assisted total hysterectomy  10-15-2010    LAPAROSCOPIC /  LYSIS ADHESIONS  . Extracorporeal shock wave lithotripsy  2003  . Cystoscopy with retrograde pyelogram, ureteroscopy and stent placement Right 07/25/2013    Procedure: CYSTOSCOPY WITH RETROGRADE PYELOGRAM, URETEROSCOPY, STONE EXTRACTION AND POSSIBLE STENT PLACEMENT;  Surgeon: Ailene Rud, MD;  Location: [facility];   Service: Urology;  Laterality: Right;  . Abdominal hysterectomy    . Embolization N/A 12/26/2014    Procedure: EMBOLIZATION;  Surgeon: Serafina Mitchell, MD;  Location: [facility] CATH LAB;  Service: Cardiovascular;  Laterality: N/A;  . Peripheral vascular catheterization  12/26/2014    Procedure: VISCERAL ANGIOGRAPHY;  Surgeon: Serafina Mitchell, MD;  Location: [facility] CATH LAB;  Service: Cardiovascular;;  . Video bronchoscopy N/A 02/06/2015    Procedure: VIDEO BRONCHOSCOPY;  Surgeon: Grace Isaac, MD;  Location: [facility] OR;  Service: Thoracic;  Laterality: N/A;  . Video assisted thoracoscopy (vats)/empyema Left 02/06/2015    Procedure: LEFT VIDEO ASSISTED THORACOSCOPY (VATS)/DRAINAGE OF EMPYEMA;  Surgeon: Grace Isaac, MD;  Location: [facility] OR;  Service: Thoracic;  Laterality: Left;  (L)VATS, DRAINAGE OF EMPYEMA    Family History  Problem Relation Age of Onset  . Diabetes Mother     type 2  . Asthma Mother   . Cancer Mother     skin cancer  . Cancer Father     bone  . Mental illness Sister     schizophrenia  . Alcohol abuse Maternal Grandmother   . Stroke Maternal Grandmother   . Aneurysm Maternal Grandmother     brain aneurysm  . Other Maternal Grandfather     black lung  . Alzheimer's disease Paternal Grandmother   . Stroke Maternal Aunt     History   Social History  . Marital Status: Married    Spouse Name: N/A  . Number  of Children: N/A  . Years of Education: N/A   Occupational History  . Not on file.   Social History Main Topics  . Smoking status: Former Smoker -- 1.00 packs/day for 30 years    Types: Cigarettes  . Smokeless tobacco: Never Used  . Alcohol Use: 0.0 oz/week    0 Standard drinks or equivalent per week     Comment: occasionally/socially  . Drug Use: No  . Sexual Activity:    Partners: Male    Birth Control/ Protection: Surgical     Comment: TLH/LOA   Other Topics Concern  . Not on file   Social History Narrative    Current Outpatient Prescriptions on  File Prior to Visit  Medication Sig Dispense Refill  . acetaminophen (TYLENOL) 500 MG tablet Take 1 tablet (500 mg total) by mouth every 6 (six) hours as needed for moderate pain. 30 tablet 0  . b complex vitamins tablet Take 1 tablet by mouth as needed (for energy).      No current facility-administered medications on file prior to visit.    No Known Allergies  Review of Systems  Review of Systems  Constitutional: Positive for malaise/fatigue. Negative for fever.  HENT: Negative for congestion.   Eyes: Negative for discharge.  Respiratory: Negative for shortness of breath.   Cardiovascular: Negative for chest pain, palpitations and leg swelling.  Gastrointestinal: Positive for nausea, vomiting and abdominal pain. Negative for diarrhea.  Genitourinary: Negative for dysuria.  Musculoskeletal: Positive for joint pain. Negative for falls.  Skin: Negative for rash.  Neurological: Negative for loss of consciousness and headaches.  Endo/Heme/Allergies: Negative for polydipsia.  Psychiatric/Behavioral: Negative for depression and suicidal ideas. The patient is not nervous/anxious and does not have insomnia.     Objective  BP 112/78 mmHg  Pulse 81  Temp(Src) 98.2 F (36.8 C) (Oral)  Ht 5\' 2"  (1.575 m)  Wt 184 lb 6 oz (83.632 kg)  BMI 33.71 kg/m2  SpO2 96%  LMP 08/10/2010  Physical Exam  Physical Exam  Constitutional: She is oriented to person, place, and time and well-developed, well-nourished, and in no distress. No distress.  HENT:  Head: Normocephalic and atraumatic.  Eyes: Conjunctivae are normal.  Neck: Neck supple. No thyromegaly present.  Cardiovascular: Normal rate, regular rhythm and normal heart sounds.   No murmur heard. Pulmonary/Chest: Effort normal and breath sounds normal. She has no wheezes.  Abdominal: She exhibits no distension and no mass.  Musculoskeletal: She exhibits no edema.  Lymphadenopathy:    She has no cervical adenopathy.  Neurological: She  is alert and oriented to person, place, and time.  Skin: Skin is warm and dry. No rash noted. She is not diaphoretic.  Psychiatric: Memory, affect and judgment normal.    Lab Results  Component Value Date   TSH 3.840 08/23/2014   Lab Results  Component Value Date   WBC 5.6 03/07/2015   HGB 11.1* 03/07/2015   HCT 34.3* 03/07/2015   MCV 86.8 03/07/2015   PLT 184 03/07/2015   Lab Results  Component Value Date   CREATININE 0.84 03/06/2015   BUN <5* 03/06/2015   NA 138 03/06/2015   K 3.9 03/06/2015   CL 104 03/06/2015   CO2 26 03/06/2015   Lab Results  Component Value Date   ALT 45* 03/04/2015   AST 52* 03/04/2015   ALKPHOS 84 03/04/2015   BILITOT 0.4 03/04/2015   Lab Results  Component Value Date   CHOL 134 03/31/2014   Lab Results  Component Value Date   HDL 39* 03/31/2014   Lab Results  Component Value Date   LDLCALC 78 03/31/2014   Lab Results  Component Value Date   TRIG 85 03/31/2014   Lab Results  Component Value Date   CHOLHDL 3.4 03/31/2014     Assessment & Plan  Unspecified hypothyroidism On Levothyroxine, continue to monitor   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 Encouraged heart healthy diet such as the DASH diet and exercise as tolerated.    Right shoulder pain Has been struggling with shoulder pain intermittently since October. Will refer to sports medicine fr further consideration.   Anemia Improving s/p hospitalization, Increase leafy greens, consider increased lean red meat and using cast iron cookware. Continue to monitor, report any concerns

## 2015-03-31 NOTE — Assessment & Plan Note (Signed)
Has not smoked since becoming ill encouraged to continue her efforts

## 2015-04-02 ENCOUNTER — Ambulatory Visit (INDEPENDENT_AMBULATORY_CARE_PROVIDER_SITE_OTHER): Payer: Managed Care, Other (non HMO) | Admitting: Infectious Disease

## 2015-04-02 ENCOUNTER — Encounter: Payer: Self-pay | Admitting: Infectious Disease

## 2015-04-02 ENCOUNTER — Telehealth: Payer: Self-pay | Admitting: *Deleted

## 2015-04-02 VITALS — BP 131/87 | HR 80 | Temp 98.8°F | Wt 184.0 lb

## 2015-04-02 DIAGNOSIS — I728 Aneurysm of other specified arteries: Secondary | ICD-10-CM

## 2015-04-02 DIAGNOSIS — Z09 Encounter for follow-up examination after completed treatment for conditions other than malignant neoplasm: Secondary | ICD-10-CM

## 2015-04-02 DIAGNOSIS — A419 Sepsis, unspecified organism: Secondary | ICD-10-CM | POA: Diagnosis not present

## 2015-04-02 DIAGNOSIS — D733 Abscess of spleen: Secondary | ICD-10-CM

## 2015-04-02 DIAGNOSIS — J869 Pyothorax without fistula: Secondary | ICD-10-CM | POA: Diagnosis not present

## 2015-04-02 LAB — COMPLETE METABOLIC PANEL WITH GFR
ALT: 10 U/L (ref 0–35)
AST: 18 U/L (ref 0–37)
Albumin: 3.8 g/dL (ref 3.5–5.2)
Alkaline Phosphatase: 54 U/L (ref 39–117)
BUN: 10 mg/dL (ref 6–23)
CO2: 29 mEq/L (ref 19–32)
CREATININE: 0.82 mg/dL (ref 0.50–1.10)
Calcium: 9.2 mg/dL (ref 8.4–10.5)
Chloride: 102 mEq/L (ref 96–112)
GFR, Est African American: >89 mL/min
GFR, Est Non African American: 85 mL/min
Glucose, Bld: 107 mg/dL — ABNORMAL HIGH (ref 70–99)
Potassium: 5.1 mEq/L (ref 3.5–5.3)
SODIUM: 138 meq/L (ref 135–145)
TOTAL PROTEIN: 7.2 g/dL (ref 6.0–8.3)
Total Bilirubin: 0.4 mg/dL (ref 0.2–1.2)

## 2015-04-02 LAB — CBC WITH DIFFERENTIAL/PLATELET
BASOS PCT: 0 % (ref 0–1)
Basophils Absolute: 0 10*3/uL (ref 0.0–0.1)
EOS ABS: 0.1 10*3/uL (ref 0.0–0.7)
EOS PCT: 3 % (ref 0–5)
HCT: 44.6 % (ref 36.0–46.0)
HEMOGLOBIN: 14.8 g/dL (ref 12.0–15.0)
LYMPHS PCT: 25 % (ref 12–46)
Lymphs Abs: 1.2 10*3/uL (ref 0.7–4.0)
MCH: 28.4 pg (ref 26.0–34.0)
MCHC: 33.2 g/dL (ref 30.0–36.0)
MCV: 85.6 fL (ref 78.0–100.0)
MPV: 10 fL (ref 8.6–12.4)
Monocytes Absolute: 0.5 10*3/uL (ref 0.1–1.0)
Monocytes Relative: 10 % (ref 3–12)
NEUTROS ABS: 3 10*3/uL (ref 1.7–7.7)
NEUTROS PCT: 62 % (ref 43–77)
PLATELETS: 147 10*3/uL — AB (ref 150–400)
RBC: 5.21 MIL/uL — AB (ref 3.87–5.11)
RDW: 16.4 % — ABNORMAL HIGH (ref 11.5–15.5)
WBC: 4.9 10*3/uL (ref 4.0–10.5)

## 2015-04-02 LAB — C-REACTIVE PROTEIN: CRP: 4.9 mg/dL — ABNORMAL HIGH (ref <0.60)

## 2015-04-02 NOTE — Telephone Encounter (Signed)
Patient called stating she woke up today with a fever of 101. She is being treated for a sinal abscess and is on oral antibiotics. Scheduled appointment today with Dr. Tommy Medal at 4:15 pm. If she needs to be seen sooner, please advise.

## 2015-04-02 NOTE — Progress Notes (Signed)
Subjective:    Patient ID: Peggy Thomas, female    DOB: 1966/05/29, 49 y.o.   MRN: [id number]  Fever  The current episode started in the past 7 days. The problem occurs rarely. The problem has been resolved. The maximum temperature noted was 101 to 101.9 F. Associated symptoms include abdominal pain and nausea. Pertinent negatives include no chest pain, congestion, coughing, diarrhea, rash, sore throat, urinary pain, vomiting or wheezing. She has tried acetaminophen for the symptoms. The treatment provided significant relief.    49 y.o. 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 She been on very broad-spectrum antibiotics but cultures had been unrevealing with the one from her splenic abscess on Gram stain showing moderate gram-positive rods and rare yeast. She's been narrowed to Unasyn + fluconazole. Since I last saw her she underwent surgery Dr. Servando Snare Bronchoscopy, left video-assisted thoracoscopy,mini thoracotomy, drainage of empyema, and decortication, he was also only discharged on oral Augmentin has had a repeat CT of the abdomen and pelvis showed  that her splenic abscess is only slightly diminished in size.  When I last saw her she was still having night sweats every night, though they were less bad than when she was first hospitalized.  Approximately 3 days after I saw her the patient on 03/03/15  With fevers and abdominal pain  Repeat CT scan showed:  The previously seen mid splenic fluid collection now measures 6.0 x 3.1 cm, decreased from previously. Trace pericolonic fluid is identified, slightly increased from previously. Other areas of irregular hypo enhancement within the spleen are reidentified, obscured by streak  artifact. One of these at the dome of the spleen image 31 series 41 is subjectively and objectively larger, now 2.1 x 1.6 cm.  She was treated with IV zosyn and fluconazole and then eventually transitioned to oral levaquin and flagyl orally.  She has had a great deal of difficutly tolerating the oral metronidazole in particular.   I saw her within the past 10 days but now she presents to clinic urgently after having had significant malaise over weekend and end of last week with temperature to over 101. It did resolve with tylenol and she is feeling better and non-toxic appearing. She had associated nausea, abdominal pain and pleuritic pain .     Review of Systems  Constitutional: Positive for fever and fatigue. Negative for chills, diaphoresis, activity change, appetite change and unexpected weight change.  HENT: Negative for congestion, rhinorrhea, sinus pressure, sneezing, sore throat and trouble swallowing.   Eyes: Negative for photophobia and visual disturbance.  Respiratory: Negative for cough, chest tightness, shortness of breath, wheezing and stridor.   Cardiovascular: Negative for chest pain, palpitations and leg swelling.  Gastrointestinal: Positive for nausea and abdominal pain. Negative for vomiting, diarrhea, constipation, blood in stool, abdominal distention and anal bleeding.  Genitourinary: Negative for dysuria, hematuria and difficulty urinating.  Musculoskeletal: Negative for myalgias, back pain, joint swelling, arthralgias and gait problem.  Skin: Negative for color change, pallor, rash and wound.  Neurological: Negative for dizziness, tremors, weakness and light-headedness.  Hematological: Negative for adenopathy. Does not bruise/bleed easily.  Psychiatric/Behavioral: Negative for behavioral problems, confusion, sleep disturbance, dysphoric mood, decreased concentration and agitation.       Objective:   Physical Exam  Constitutional:  She is oriented to person,  place, and time. She appears well-developed and well-nourished. No distress.  HENT:  Head: Normocephalic and atraumatic.  Mouth/Throat: No oropharyngeal exudate.  Eyes: Conjunctivae and EOM are normal. No scleral icterus.  Neck: Normal range of motion. Neck supple.  Cardiovascular: Normal rate, regular rhythm and normal heart sounds.  Exam reveals no gallop and no friction rub.   No murmur heard. Pulmonary/Chest: Effort normal. No respiratory distress. She has decreased breath sounds in the left middle field and the left lower field. She has no wheezes.  Abdominal: Soft. Bowel sounds are normal. She exhibits no distension. There is tenderness.  Musculoskeletal: She exhibits no edema or tenderness.  Neurological: She is alert and oriented to person, place, and time. She exhibits normal muscle tone. Coordination normal.  Skin: Skin is warm and dry. No rash noted. She is not diaphoretic. No erythema. No pallor.  Psychiatric: She has a normal mood and affect. Her behavior is normal. Judgment and thought content normal.   Scars from her CT surgery are well-healed        Assessment & Plan:   Splenic abscess:   Obtain labs today and check CT of the abdomen and pelvis with contrast  She was asking should she get a 2nd opinion re whether she should have surgery.  After my visit I ran into Dr. Hulen Skains with [facility] who is scheduled to see her in early June and he reiterated that he is certainly willing to operate and he actually had been under the impression that surgery had been planned during last hospital stay. He did re-iterate that while the surgery would be challenging and require CT surgery assistance he is quite willing to operate. He is also OK if patient wishes to get 2nd opinion.  I am also willing to give her protracted IV antibiotics such as invanz but more likelly IV merrem + antifungal though I do no think this will make tremendous difference. I think splenectomy is imminent.  She has  had important vaccines pre-potential splenectomy.  I spent greater than 25 minutes with the patient including greater than 50% of time in face to face counsel of the patient Regarding the nature of her infection and management going forward including protracted oral antibiotics and repeat imaging.and in coordination of their care.  Empyema: status post cardiothoracic surgery: she follows with  Dr. Servando Snare   Nausea due to flagyl: if this becomes a bigger problem consider change her to IV invanz vs merrem

## 2015-04-03 ENCOUNTER — Other Ambulatory Visit: Payer: Self-pay | Admitting: Infectious Disease

## 2015-04-03 DIAGNOSIS — R110 Nausea: Secondary | ICD-10-CM

## 2015-04-03 LAB — SEDIMENTATION RATE: Sed Rate: 38 mm/hr — ABNORMAL HIGH (ref 0–20)

## 2015-04-03 NOTE — Telephone Encounter (Signed)
RN spoke with pt, still taking phenergan 3 times a day

## 2015-04-05 ENCOUNTER — Ambulatory Visit
Admission: RE | Admit: 2015-04-05 | Discharge: 2015-04-05 | Disposition: A | Discharge status: Home / Self Care | Payer: Managed Care, Other (non HMO) | Source: Ambulatory Visit | Attending: Infectious Disease | Admitting: Infectious Disease

## 2015-04-05 DIAGNOSIS — J869 Pyothorax without fistula: Secondary | ICD-10-CM

## 2015-04-05 MED ORDER — IOHEXOL 300 MG/ML  SOLN
100.0000 mL | Freq: Once | INTRAMUSCULAR | Status: AC | PRN
  Administered 2015-04-05: 100 mL via INTRAVENOUS

## 2015-04-10 ENCOUNTER — Telehealth: Payer: Self-pay | Admitting: Licensed Clinical Social Worker

## 2015-04-10 NOTE — Telephone Encounter (Signed)
Patient would like to go back to work on June 7, and would like it to say she can work light duty with light or no lifting(whichever you recommend) for 2 weeks and then return to regular full time with no restrictions if feeling better.

## 2015-04-10 NOTE — Telephone Encounter (Signed)
Work restrictions should be filled out by her Education officer, environmental not by me

## 2015-04-11 NOTE — Telephone Encounter (Signed)
I spoke to the patient and she will contact general surgery

## 2015-04-11 NOTE — Telephone Encounter (Signed)
Thank you :)

## 2015-04-13 ENCOUNTER — Other Ambulatory Visit: Payer: Self-pay | Admitting: *Deleted

## 2015-04-13 ENCOUNTER — Other Ambulatory Visit: Payer: Self-pay | Admitting: Cardiothoracic Surgery

## 2015-04-13 DIAGNOSIS — J90 Pleural effusion, not elsewhere classified: Secondary | ICD-10-CM

## 2015-04-13 DIAGNOSIS — J869 Pyothorax without fistula: Secondary | ICD-10-CM

## 2015-04-16 ENCOUNTER — Encounter: Payer: Self-pay | Admitting: *Deleted

## 2015-04-16 ENCOUNTER — Ambulatory Visit (INDEPENDENT_AMBULATORY_CARE_PROVIDER_SITE_OTHER): Payer: Managed Care, Other (non HMO) | Admitting: Physician Assistant

## 2015-04-16 ENCOUNTER — Ambulatory Visit
Admission: RE | Admit: 2015-04-16 | Discharge: 2015-04-16 | Disposition: A | Discharge status: Home / Self Care | Payer: Managed Care, Other (non HMO) | Source: Ambulatory Visit | Attending: Cardiothoracic Surgery | Admitting: Cardiothoracic Surgery

## 2015-04-16 VITALS — BP 130/88 | HR 90 | Resp 20 | Ht 62.0 in | Wt 185.0 lb

## 2015-04-16 DIAGNOSIS — J869 Pyothorax without fistula: Secondary | ICD-10-CM

## 2015-04-16 DIAGNOSIS — Z9889 Other specified postprocedural states: Secondary | ICD-10-CM

## 2015-04-16 NOTE — Progress Notes (Signed)
  HPI: Patient returns for routine postoperative follow-up having undergone Left VATs with Drainage of Empyema on 02/06/2015. The patient's early postoperative recovery while in the hospital was notable for Splenic abscess.  Since hospital discharge the patient reports she is doing much better.  She was readmitted to the hospital about 1 month after surgery with sepsis from her Splenic abscess.  She was treated with IV ABX and has been routinely followed by ID since discharge.  The patient states she is slowly getting better.  She states her stamina is slowly improving.  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     Current Outpatient Prescriptions  Medication Sig Dispense Refill  . acetaminophen (TYLENOL) 500 MG tablet Take 1 tablet (500 mg total) by mouth every 6 (six) hours as needed for moderate pain. 30 tablet 0  . b complex vitamins tablet Take 1 tablet by mouth as needed (for energy).     Marland Kitchen diclofenac (VOLTAREN) 75 MG EC tablet Take 1 tablet (75 mg total) by mouth 2 (two) times daily. 60 tablet 1  . levofloxacin (LEVAQUIN) 500 MG tablet Take 1 tablet (500 mg total) by mouth daily. 30 tablet 6  . levothyroxine (SYNTHROID, LEVOTHROID) 75 MCG tablet TAKE 1 TABLET (75 MCG TOTAL) BY MOUTH DAILY BEFORE BREAKFAST. 30 tablet 6  . LORazepam (ATIVAN) 0.5 MG tablet Take 0.5-1 tablets (0.25-0.5 mg total) by mouth 2 (two) times daily as needed for anxiety. 30 tablet 1  . metroNIDAZOLE (FLAGYL) 500 MG tablet Take 1 tablet (500 mg total) by mouth every 8 (eight) hours. 90 tablet 6  . promethazine (PHENERGAN) 25 MG tablet TAKE 1 TABLET (25 MG TOTAL) BY MOUTH EVERY 6 (SIX) HOURS AS NEEDED FOR NAUSEA OR VOMITING. 90 tablet 0  . traMADol (ULTRAM) 50 MG tablet Take 1-2 tablets (50-100 mg total) by mouth every 6 (six) hours as needed (mild pain). 30 tablet 0   No current facility-administered medications for this visit.    Physical Exam:  BP 130/88 mmHg  Pulse 90  Resp 20  Ht 5\' 2"  (1.575 m)  Wt 185 lb  (83.915 kg)  BMI 33.83 kg/m2  SpO2 98%  LMP 08/10/2010  Gen: no apparent distress Heart: RRR Lungs: CTA bilaterally Skin: incisions well healed  Diagnostic Tests:  CXR: post surgical changes, no significant pleural effusion  A/P:  1. S/P VATS for empyema from splenic abscess- doing very well 2. Splenic abscess- readmitted 4/26, on ABX, ID following, doing well since hospital discharge 3. RTW- gave patient slip to RTW with lifting restrictions 4. RTC prn, patient educated to contact ID should fevers, chills, or problems redevelop.   Ellwood Handler, PA-C [facility] [phone]

## 2015-05-02 ENCOUNTER — Ambulatory Visit (INDEPENDENT_AMBULATORY_CARE_PROVIDER_SITE_OTHER): Payer: Managed Care, Other (non HMO) | Admitting: Family Medicine

## 2015-05-02 ENCOUNTER — Encounter: Payer: Self-pay | Admitting: Family Medicine

## 2015-05-02 VITALS — BP 122/86 | HR 70 | Ht 62.0 in | Wt 184.0 lb

## 2015-05-02 DIAGNOSIS — M25511 Pain in right shoulder: Secondary | ICD-10-CM

## 2015-05-02 MED ORDER — METHYLPREDNISOLONE ACETATE 40 MG/ML IJ SUSP
40.0000 mg | Freq: Once | INTRAMUSCULAR | Status: AC
  Administered 2015-05-02: 40 mg via INTRA_ARTICULAR

## 2015-05-02 NOTE — Patient Instructions (Signed)
Continue with home exercises, diclofenac. You were given a cortisone shot today. Call us in 1-2 weeks if not improving as much as you would like to consider physical therapy and/or nitro patches. Follow up with me in 5-6 weeks otherwise.

## 2015-05-03 ENCOUNTER — Other Ambulatory Visit: Payer: Self-pay | Admitting: Family Medicine

## 2015-05-03 NOTE — Progress Notes (Signed)
PCP and referred by: Penni Homans, MD  Subjective:   HPI: Patient is a 49 y.o. female here for right shoulder pain.  5/19: Patient reports shoulder pain started back in October 2015. No known injury or trauma. 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 + night pain. Worse lying on right side. Worse reaching and going overhead. Has tried icy hot, deep blue, heat, aleve. Is right handed.  6/22: Patient reports she is still having difficulties with the right shoulder. Sleeping is difficult. Taking tramadol. Back at work for 2 weeks now. Taking diclofenac. Not doing home exercises regularly.  Past Medical History  Diagnosis Date  . Hypocalcemia   . Depression 04/30/2011  . Hypothyroidism 04/30/2011  . History of kidney stones   . Right ureteral stone   . Chronic idiopathic thrombocytopenia     MILD--   HEMATOLOGIST--  DR Marin Olp  . PONV (postoperative nausea and vomiting)   . 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 Renal insufficiency   . Nausea without vomiting 03/26/2015  . History of tobacco abuse 04/30/2011  . Anemia 03/31/2015    Current Outpatient Prescriptions on File Prior to Visit  Medication Sig Dispense Refill  . acetaminophen (TYLENOL) 500 MG tablet Take 1 tablet (500 mg total) by mouth every 6 (six) hours as needed for moderate pain. 30 tablet 0  . b complex vitamins tablet Take 1 tablet by mouth as needed (for energy).     Marland Kitchen diclofenac (VOLTAREN) 75 MG EC tablet Take 1 tablet (75 mg total) by mouth 2 (two) times daily. 60 tablet 1  . levofloxacin (LEVAQUIN) 500 MG tablet Take 1 tablet (500 mg total) by mouth daily. 30 tablet 6  . levothyroxine (SYNTHROID, LEVOTHROID) 75 MCG tablet TAKE 1 TABLET (75 MCG TOTAL) BY MOUTH DAILY BEFORE BREAKFAST. 30 tablet 6  . LORazepam (ATIVAN) 0.5 MG tablet Take 0.5-1 tablets (0.25-0.5 mg total) by mouth 2 (two) times daily as needed for anxiety. 30 tablet 1  . metroNIDAZOLE (FLAGYL) 500 MG  tablet Take 1 tablet (500 mg total) by mouth every 8 (eight) hours. 90 tablet 6  . promethazine (PHENERGAN) 25 MG tablet TAKE 1 TABLET (25 MG TOTAL) BY MOUTH EVERY 6 (SIX) HOURS AS NEEDED FOR NAUSEA OR VOMITING. 90 tablet 0  . traMADol (ULTRAM) 50 MG tablet Take 1-2 tablets (50-100 mg total) by mouth every 6 (six) hours as needed (mild pain). 30 tablet 0   No current facility-administered medications on file prior to visit.    Past Surgical History  Procedure Laterality Date  . Cholecystectomy  2000  . Cesarean section  1996 and 1998  . Tubal ligation  1999  . Knee arthroscopy    . Robotic assisted total hysterectomy  10-15-2010    LAPAROSCOPIC /  LYSIS ADHESIONS  . Extracorporeal shock wave lithotripsy  2003  . Cystoscopy with retrograde pyelogram, ureteroscopy and stent placement Right 07/25/2013    Procedure: CYSTOSCOPY WITH RETROGRADE PYELOGRAM, URETEROSCOPY, STONE EXTRACTION AND POSSIBLE STENT PLACEMENT;  Surgeon: Ailene Rud, MD;  Location: [facility];  Service: Urology;  Laterality: Right;  . Abdominal hysterectomy    . Embolization N/A 12/26/2014    Procedure: EMBOLIZATION;  Surgeon: Serafina Mitchell, MD;  Location: [facility] CATH LAB;  Service: Cardiovascular;  Laterality: N/A;  . Peripheral vascular catheterization  12/26/2014    Procedure: VISCERAL ANGIOGRAPHY;  Surgeon: Serafina Mitchell, MD;  Location: [facility] CATH LAB;  Service: Cardiovascular;;  . Video bronchoscopy N/A 02/06/2015    Procedure: VIDEO BRONCHOSCOPY;  Surgeon: Grace Isaac, MD;  Location: [facility] OR;  Service: Thoracic;  Laterality: N/A;  . Video assisted thoracoscopy (vats)/empyema Left 02/06/2015    Procedure: LEFT VIDEO ASSISTED THORACOSCOPY (VATS)/DRAINAGE OF EMPYEMA;  Surgeon: Grace Isaac, MD;  Location: [place];  Service: Thoracic;  Laterality: Left;  (L)VATS, DRAINAGE OF EMPYEMA    No Known Allergies  History   Social History  . Marital Status: Married    Spouse Name: N/A  . Number of  Children: N/A  . Years of Education: N/A   Occupational History  . Not on file.   Social History Main Topics  . Smoking status: Former Smoker -- 1.00 packs/day for 30 years    Types: Cigarettes  . Smokeless tobacco: Never Used  . Alcohol Use: 0.0 oz/week    0 Standard drinks or equivalent per week     Comment: occasionally/socially  . Drug Use: No  . Sexual Activity:    Partners: Male    Birth Control/ Protection: Surgical     Comment: TLH/LOA   Other Topics Concern  . Not on file   Social History Narrative    Family History  Problem Relation Age of Onset  . Diabetes Mother     type 2  . Asthma Mother   . Cancer Mother     skin cancer  . Cancer Father     bone  . Mental illness Sister     schizophrenia  . Alcohol abuse Maternal Grandmother   . Stroke Maternal Grandmother   . Aneurysm Maternal Grandmother     brain aneurysm  . Other Maternal Grandfather     black lung  . Alzheimer's disease Paternal Grandmother   . Stroke Maternal Aunt     BP 122/86 mmHg  Pulse 70  Ht 5\' 2"  (1.575 m)  Wt 184 lb (83.462 kg)  BMI 33.65 kg/m2  LMP 08/10/2010  Review of Systems: See HPI above.    Objective:  Physical Exam:  Gen: NAD  Right shoulder: No swelling, ecchymoses.  No gross deformity. No TTP. FROM with painful arc. Positive Hawkins, Neers. Negative Speeds, Yergasons. Strength 5/5 with empty can and resisted internal/external rotation.  Pain empty can and ER. Negative apprehension. NV intact distally.    Assessment & Plan:  1. Right shoulder pain - 2/2 rotator cuff impingement.  Continue diclofenac.  Encouraged to do home exercises regularly.  Injection given today.  Consider PT, nitro patches if not improving.  F/u in 5-6 weeks.  After informed written consent, patient was seated on exam table. Right shoulder was prepped with alcohol swab and utilizing posterior approach, patient's right subacromial space was injected with 3:1 marcaine: depomedrol.  Patient tolerated the procedure well without immediate complications.

## 2015-05-03 NOTE — Assessment & Plan Note (Signed)
2/2 rotator cuff impingement.  Continue diclofenac.  Encouraged to do home exercises regularly.  Injection given today.  Consider PT, nitro patches if not improving.  F/u in 5-6 weeks.  After informed written consent, patient was seated on exam table. Right shoulder was prepped with alcohol swab and utilizing posterior approach, patient's right subacromial space was injected with 3:1 marcaine: depomedrol. Patient tolerated the procedure well without immediate complications.

## 2015-05-10 ENCOUNTER — Ambulatory Visit: Payer: Managed Care, Other (non HMO) | Admitting: Family Medicine

## 2015-05-21 ENCOUNTER — Ambulatory Visit
Admission: RE | Admit: 2015-05-21 | Discharge: 2015-05-21 | Disposition: A | Discharge status: Home / Self Care | Payer: Managed Care, Other (non HMO) | Source: Ambulatory Visit | Attending: Surgery | Admitting: Surgery

## 2015-05-21 ENCOUNTER — Inpatient Hospital Stay: Admission: RE | Admit: 2015-05-21 | Payer: Managed Care, Other (non HMO) | Source: Ambulatory Visit

## 2015-05-21 DIAGNOSIS — I728 Aneurysm of other specified arteries: Secondary | ICD-10-CM

## 2015-05-21 MED ORDER — IOPAMIDOL (ISOVUE-370) INJECTION 76%
75.0000 mL | Freq: Once | INTRAVENOUS | Status: AC | PRN
  Administered 2015-05-21: 75 mL via INTRAVENOUS

## 2015-05-24 ENCOUNTER — Encounter: Payer: Self-pay | Admitting: Surgery

## 2015-05-28 ENCOUNTER — Ambulatory Visit (INDEPENDENT_AMBULATORY_CARE_PROVIDER_SITE_OTHER): Payer: Managed Care, Other (non HMO) | Admitting: Surgery

## 2015-05-28 ENCOUNTER — Encounter: Payer: Self-pay | Admitting: Surgery

## 2015-05-28 VITALS — BP 123/89 | HR 93 | Temp 98.3°F | Ht 62.0 in | Wt 193.4 lb

## 2015-05-28 DIAGNOSIS — I728 Aneurysm of other specified arteries: Secondary | ICD-10-CM | POA: Diagnosis not present

## 2015-05-28 NOTE — Progress Notes (Signed)
Patient name: Peggy Thomas MRN: [id number] DOB: 07-10-1966 Sex: female     Chief Complaint  Patient presents with  . Re-evaluation    3 month f/u CTA abd/pelvis prior    HISTORY OF PRESENT ILLNESS: Patient is back today for follow-up. 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 She was vaccinated prior to her procedure. She continued to have night sweats and low-grade fevers. CT scan revealed a splenic collection. This ultimately was aspirated. Cultures from the aspirate grew out rare yeast and few diphtheroids (Corynebacterium species). The patient developed a lung abscess following this which required a VATS.She had fevers and sweats for a protracted time.  She has been on suppression endobiotic therapy, which is managed by infectious disease.  She reports no fevers or chills for approximately 2 months.  She is back to work full-time.  The anti-biotics do make her nauseated and the Phenergan X retired.  However, she is tolerating this regimen.  Past Medical History  Diagnosis Date  . Hypocalcemia   . Depression 04/30/2011  . Hypothyroidism 04/30/2011  . History of kidney stones   . Right ureteral stone   . Chronic idiopathic thrombocytopenia     MILD--   HEMATOLOGIST--  DR Marin Olp  . PONV (postoperative nausea and vomiting)   . 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 Renal insufficiency   . Nausea without vomiting 03/26/2015  . History of tobacco abuse 04/30/2011  . Anemia 03/31/2015    Past Surgical History  Procedure Laterality Date  . Cholecystectomy  2000  . Cesarean section  1996 and 1998  . Tubal ligation  1999  . Knee arthroscopy    . Robotic assisted total hysterectomy  10-15-2010    LAPAROSCOPIC /  LYSIS ADHESIONS  . Extracorporeal shock wave lithotripsy  2003  . Cystoscopy with retrograde pyelogram, ureteroscopy and stent placement Right 07/25/2013    Procedure: CYSTOSCOPY WITH RETROGRADE PYELOGRAM, URETEROSCOPY, STONE  EXTRACTION AND POSSIBLE STENT PLACEMENT;  Surgeon: Ailene Rud, MD;  Location: [facility];  Service: Urology;  Laterality: Right;  . Abdominal hysterectomy    . Embolization N/A 12/26/2014    Procedure: EMBOLIZATION;  Surgeon: Serafina Mitchell, MD;  Location: [facility] CATH LAB;  Service: Cardiovascular;  Laterality: N/A;  . Peripheral vascular catheterization  12/26/2014    Procedure: VISCERAL ANGIOGRAPHY;  Surgeon: Serafina Mitchell, MD;  Location: [facility] CATH LAB;  Service: Cardiovascular;;  . Video bronchoscopy N/A 02/06/2015    Procedure: VIDEO BRONCHOSCOPY;  Surgeon: Grace Isaac, MD;  Location: [facility] OR;  Service: Thoracic;  Laterality: N/A;  . Video assisted thoracoscopy (vats)/empyema Left 02/06/2015    Procedure: LEFT VIDEO ASSISTED THORACOSCOPY (VATS)/DRAINAGE OF EMPYEMA;  Surgeon: Grace Isaac, MD;  Location: [place];  Service: Thoracic;  Laterality: Left;  (L)VATS, DRAINAGE OF EMPYEMA    History   Social History  . Marital Status: Married    Spouse Name: N/A  . Number of Children: N/A  . Years of Education: N/A   Occupational History  . Not on file.   Social History Main Topics  . Smoking status: Former Smoker -- 1.00 packs/day for 30 years    Types: Cigarettes  . Smokeless tobacco: Never Used  . Alcohol Use: 0.0 oz/week    0 Standard drinks or equivalent per week     Comment: occasionally/socially  . Drug Use: No  . Sexual Activity:    Partners: Male    Birth  Control/ Protection: Surgical     Comment: TLH/LOA   Other Topics Concern  . Not on file   Social History Narrative    Family History  Problem Relation Age of Onset  . Diabetes Mother     type 2  . Asthma Mother   . Cancer Mother     skin cancer  . Cancer Father     bone  . Mental illness Sister     schizophrenia  . Alcohol abuse Maternal Grandmother   . Stroke Maternal Grandmother   . Aneurysm Maternal Grandmother     brain aneurysm  . Other Maternal Grandfather     black lung    . Alzheimer's disease Paternal Grandmother   . Stroke Maternal Aunt     Allergies as of 05/28/2015  . (No Known Allergies)    Current Outpatient Prescriptions on File Prior to Visit  Medication Sig Dispense Refill  . acetaminophen (TYLENOL) 500 MG tablet Take 1 tablet (500 mg total) by mouth every 6 (six) hours as needed for moderate pain. 30 tablet 0  . b complex vitamins tablet Take 1 tablet by mouth as needed (for energy).     Marland Kitchen ibuprofen (ADVIL,MOTRIN) 600 MG tablet TAKE 1 TABLET BY MOUTH EVERY 8 HOURS AS NEEDED 40 tablet 0  . levofloxacin (LEVAQUIN) 500 MG tablet Take 1 tablet (500 mg total) by mouth daily. 30 tablet 6  . levothyroxine (SYNTHROID, LEVOTHROID) 75 MCG tablet TAKE 1 TABLET (75 MCG TOTAL) BY MOUTH DAILY BEFORE BREAKFAST. 30 tablet 6  . LORazepam (ATIVAN) 0.5 MG tablet Take 0.5-1 tablets (0.25-0.5 mg total) by mouth 2 (two) times daily as needed for anxiety. 30 tablet 1  . metroNIDAZOLE (FLAGYL) 500 MG tablet Take 1 tablet (500 mg total) by mouth every 8 (eight) hours. 90 tablet 6  . promethazine (PHENERGAN) 25 MG tablet TAKE 1 TABLET (25 MG TOTAL) BY MOUTH EVERY 6 (SIX) HOURS AS NEEDED FOR NAUSEA OR VOMITING. 90 tablet 0  . traMADol (ULTRAM) 50 MG tablet Take 1-2 tablets (50-100 mg total) by mouth every 6 (six) hours as needed (mild pain). 30 tablet 0  . diclofenac (VOLTAREN) 75 MG EC tablet Take 1 tablet (75 mg total) by mouth 2 (two) times daily. (Patient not taking: Reported on 05/28/2015) 60 tablet 1   No current facility-administered medications on file prior to visit.     REVIEW OF SYSTEMS: See history of present illness.  Otherwise negative  PHYSICAL EXAMINATION:   Vital signs are  Filed Vitals:   05/28/15 1039  BP: 123/89  Pulse: 93  Temp: 98.3 F (36.8 C)  TempSrc: Oral  Height: 5\' 2"  (1.575 m)  Weight: 193 lb 6.4 oz (87.726 kg)  SpO2: 98%   Body mass index is 35.36 kg/(m^2). General: The patient appears their stated age. HEENT:  No gross  abnormalities Pulmonary:  Non labored breathing Abdomen: Soft and non-tender Musculoskeletal: There are no major deformities. Neurologic: No focal weakness or paresthesias are detected, Skin: There are no ulcer or rashes noted. Psychiatric: The patient has normal affect. Cardiovascular: There is a regular rate and rhythm without significant murmur appreciated.   Diagnostic Studies I have reviewed her CT scan with the following findings: The low density lesions along the inferior spleen have decreased in size with stable adjacent inflammatory changes. The area of low density along the superior aspect of spleen has resolved. Findings suggest resolving areas of splenic necrosis. No new lesions and no new inflammatory changes involving the spleen  This heterogeneous area now measures 4.9 x 2.9 cm on sequence 8, image 27 and previously measured 6.7 x 3.7 cm.  Assessment: Status post coiling of splenic artery aneurysm Plan: The patient has had a fairly good course over the past 2 months.  CT scan today shows interval decrease of the necrotic splenic tissue.  At this point given the change on CT scan and the patient's overall clinical condition, I would favor continuing medical management to see if we can salvage her spleen.  I discussed the possibility of a splenectomy.  She has seen Dr. Hulen Skains in the past.  For now, we will plan on nonoperative treatment, unless her clinical condition changes.  I will see her back in several months.  Eldridge Abrahams, M.D. [facility] Office: [phone] Pager:  [phone]

## 2015-05-29 ENCOUNTER — Encounter: Payer: Self-pay | Admitting: Infectious Disease

## 2015-05-29 ENCOUNTER — Ambulatory Visit (INDEPENDENT_AMBULATORY_CARE_PROVIDER_SITE_OTHER): Payer: Managed Care, Other (non HMO) | Admitting: Infectious Disease

## 2015-05-29 VITALS — BP 112/80 | HR 84 | Temp 98.4°F | Wt 194.0 lb

## 2015-05-29 DIAGNOSIS — A419 Sepsis, unspecified organism: Secondary | ICD-10-CM

## 2015-05-29 DIAGNOSIS — D733 Abscess of spleen: Secondary | ICD-10-CM | POA: Diagnosis not present

## 2015-05-29 DIAGNOSIS — J869 Pyothorax without fistula: Secondary | ICD-10-CM | POA: Diagnosis not present

## 2015-05-29 MED ORDER — LEVOFLOXACIN 500 MG PO TABS: 500.0000 mg | ORAL_TABLET | Freq: Every day | ORAL | Status: DC

## 2015-05-29 MED ORDER — METRONIDAZOLE 500 MG PO TABS: 500.0000 mg | ORAL_TABLET | Freq: Three times a day (TID) | ORAL | Status: DC

## 2015-05-29 NOTE — Progress Notes (Signed)
Subjective:    Patient ID: Peggy Thomas, female    DOB: 30-Nov-1965, 49 y.o.   MRN: [id number]  HPI  49 y.o. 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 She been on very broad-spectrum antibiotics but cultures had been unrevealing with the one from her splenic abscess on Gram stain showing moderate gram-positive rods and rare yeast. She's been narrowed to Unasyn + fluconazole. Since I last saw her she underwent surgery Dr. Servando Snare Bronchoscopy, left video-assisted thoracoscopy,mini thoracotomy, drainage of empyema, and decortication, he was also only discharged on oral Augmentin has had a repeat CT of the abdomen and pelvis showed  that her splenic abscess is only slightly diminished in size.  When I last saw her she was still having night sweats every night, though they were less bad than when she was first hospitalized.  Approximately 3 days after I saw her the patient on 03/03/15  With fevers and abdominal pain  Repeat CT scan showed:  The previously seen mid splenic fluid collection now measures 6.0 x 3.1 cm, decreased from previously. Trace pericolonic fluid is identified, slightly increased from previously. Other areas of irregular hypo enhancement within the spleen are reidentified, obscured by streak artifact. One of these at the dome of the spleen image 31 series 41 is subjectively and objectively larger, now 2.1 x 1.6 cm.  She was treated with IV zosyn and fluconazole and then eventually transitioned to oral levaquin and flagyl orally.  She has had a great deal of difficutly tolerating the oral metronidazole in particular.  When I last saw her in May she was under a fair amount of discomfort and had recurrence of fevers  now is worried about her dominant abscess and a CT scan of the abdomen and pelvis was obtained  It showed:  IMPRESSION: 1. There has been interval decrease in size of the larger splenic lesion, now measuring 6.7 cm. Small hypodense irregular lesion in the upper portion of the spleen is probably stable in size but appears more well defined. 2. No new splenic lesions are identified. 3. Nonobstructed bowel pattern.  She Remained on levofloxacin and metronidazole and since then has been reimaged by vascular surgery on 05/21/2015 with CT scan showing:  The low density lesions along the inferior spleen have decreased in size with stable adjacent inflammatory changes. The area of low density along the superior aspect of spleen has resolved. Findings suggest resolving areas of splenic necrosis. No new lesions and no new inflammatory changes involving the spleen.  At present decision has been made to try to cure this with antibiotics alone she remains on  levofloxacin and metronidazole. She is tolerating them fairly well.he is not experiencing fevers or chills and her abdominal pain has not recurred or worsened.  Review of Systems  Constitutional: Negative for chills, diaphoresis, activity change, appetite change and unexpected weight change.  HENT: Negative for rhinorrhea, sinus pressure, sneezing and trouble swallowing.   Eyes: Negative for photophobia and visual disturbance.  Respiratory: Negative for chest tightness, shortness of breath and stridor.   Cardiovascular: Negative for palpitations and leg swelling.  Gastrointestinal: Negative for constipation, blood in stool, abdominal distention and anal bleeding.  Genitourinary: Negative for hematuria and difficulty urinating.  Musculoskeletal: Negative for myalgias, back pain, joint swelling,  arthralgias and gait problem.  Skin: Negative for color change, pallor and wound.  Neurological: Negative for dizziness, tremors, weakness and  light-headedness.  Hematological: Negative for adenopathy. Does not bruise/bleed easily.  Psychiatric/Behavioral: Negative for behavioral problems, confusion, sleep disturbance, dysphoric mood, decreased concentration and agitation.       Objective:   Physical Exam  Constitutional: She is oriented to person, place, and time. She appears well-developed and well-nourished. No distress.  HENT:  Head: Normocephalic and atraumatic.  Mouth/Throat: No oropharyngeal exudate.  Eyes: Conjunctivae and EOM are normal. No scleral icterus.  Neck: Normal range of motion. Neck supple.  Cardiovascular: Normal rate, regular rhythm and normal heart sounds.  Exam reveals no gallop and no friction rub.   No murmur heard. Pulmonary/Chest: Effort normal. No respiratory distress. She has decreased breath sounds in the left middle field and the left lower field. She has no wheezes.  Abdominal: Soft. Bowel sounds are normal. She exhibits no distension. There is tenderness.  Musculoskeletal: She exhibits no edema or tenderness.  Neurological: She is alert and oriented to person, place, and time. She exhibits normal muscle tone. Coordination normal.  Skin: Skin is warm and dry. No rash noted. She is not diaphoretic. No erythema. No pallor.  Psychiatric: She has a normal mood and affect. Her behavior is normal. Judgment and thought content normal.   Scars from her CT surgery are well-healed        Assessment & Plan:   Splenic abscess:   Continue levofloxacin and metronidazole and repeat CT scan in 3 months time. Trying to minimize radiation exposure given the fact that she is our he had EIGHT CTS in  2016 alone.   She has had important vaccines pre-potential splenectomy.    Empyema: status post cardiothoracic surgery: she follows with  Dr. Servando Snare

## 2015-06-07 ENCOUNTER — Ambulatory Visit (INDEPENDENT_AMBULATORY_CARE_PROVIDER_SITE_OTHER): Payer: Managed Care, Other (non HMO) | Admitting: Family Medicine

## 2015-06-07 ENCOUNTER — Encounter: Payer: Self-pay | Admitting: Family Medicine

## 2015-06-07 VITALS — BP 117/81 | HR 85 | Ht 62.0 in | Wt 192.0 lb

## 2015-06-07 DIAGNOSIS — M25511 Pain in right shoulder: Secondary | ICD-10-CM

## 2015-06-07 DIAGNOSIS — M79674 Pain in right toe(s): Secondary | ICD-10-CM | POA: Diagnosis not present

## 2015-06-07 MED ORDER — NITROGLYCERIN 0.2 MG/HR TD PT24: MEDICATED_PATCH | TRANSDERMAL | Status: DC

## 2015-06-07 MED ORDER — METHYLPREDNISOLONE ACETATE 40 MG/ML IJ SUSP
40.0000 mg | Freq: Once | INTRAMUSCULAR | Status: AC
  Administered 2015-06-07: 20 mg via INTRA_ARTICULAR

## 2015-06-07 NOTE — Patient Instructions (Signed)
Take diclofenac only as needed now. Try to do home exercises most days of the week. Nitro patches 1/4th patch over worse shoulder, change daily. If you're doing well with this in a week you can use it on the other shoulder too.  For the 1st MTP arthritis, bone spur wait about a week after the shot before trying regular shoes again. As we discussed, shoes with a wide toe box are preferable. Can try buddy taping to next toe for support. Follow up with me in 6 weeks.

## 2015-06-07 NOTE — Progress Notes (Signed)
PCP and referred by: Penni Homans, MD  Subjective:   HPI: Patient is a 49 y.o. female here for right shoulder pain.  5/19: Patient reports shoulder pain started back in October 2015. No known injury or trauma. 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 + night pain. Worse lying on right side. Worse reaching and going overhead. Has tried icy hot, deep blue, heat, aleve. Is right handed.  6/22: Patient reports she is still having difficulties with the right shoulder. Sleeping is difficult. Taking tramadol. Back at work for 2 weeks now. Taking diclofenac. Not doing home exercises regularly.  7/28: Patient reports shoulder feels about 80% improved since last visit. Still with some pain at night. Taking diclofenac. Interested in trying nitro. Not doing home exercises regularly. She also has a known bone spur that bothers her base of right 1st MTP.  Has tried different footwear - any rubbing of this bothers her and cant wear normal shoes.  Would like to try injection.  Past Medical History  Diagnosis Date  . Hypocalcemia   . Depression 04/30/2011  . Hypothyroidism 04/30/2011  . History of kidney stones   . Right ureteral stone   . Chronic idiopathic thrombocytopenia     MILD--   HEMATOLOGIST--  DR Marin Olp  . PONV (postoperative nausea and vomiting)   . 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 Renal insufficiency   . Nausea without vomiting 03/26/2015  . History of tobacco abuse 04/30/2011  . Anemia 03/31/2015    Current Outpatient Prescriptions on File Prior to Visit  Medication Sig Dispense Refill  . acetaminophen (TYLENOL) 500 MG tablet Take 1 tablet (500 mg total) by mouth every 6 (six) hours as needed for moderate pain. 30 tablet 0  . b complex vitamins tablet Take 1 tablet by mouth as needed (for energy).     Marland Kitchen diclofenac (VOLTAREN) 75 MG EC tablet Take 1 tablet (75 mg total) by mouth 2 (two) times daily. 60 tablet 1  . ibuprofen  (ADVIL,MOTRIN) 600 MG tablet TAKE 1 TABLET BY MOUTH EVERY 8 HOURS AS NEEDED (Patient not taking: Reported on 05/29/2015) 40 tablet 0  . levofloxacin (LEVAQUIN) 500 MG tablet Take 1 tablet (500 mg total) by mouth daily. 30 tablet 6  . levothyroxine (SYNTHROID, LEVOTHROID) 75 MCG tablet TAKE 1 TABLET (75 MCG TOTAL) BY MOUTH DAILY BEFORE BREAKFAST. 30 tablet 6  . LORazepam (ATIVAN) 0.5 MG tablet Take 0.5-1 tablets (0.25-0.5 mg total) by mouth 2 (two) times daily as needed for anxiety. (Patient not taking: Reported on 05/29/2015) 30 tablet 1  . metroNIDAZOLE (FLAGYL) 500 MG tablet Take 1 tablet (500 mg total) by mouth every 8 (eight) hours. 90 tablet 6  . promethazine (PHENERGAN) 25 MG tablet TAKE 1 TABLET (25 MG TOTAL) BY MOUTH EVERY 6 (SIX) HOURS AS NEEDED FOR NAUSEA OR VOMITING. 90 tablet 0  . traMADol (ULTRAM) 50 MG tablet Take 1-2 tablets (50-100 mg total) by mouth every 6 (six) hours as needed (mild pain). 30 tablet 0   No current facility-administered medications on file prior to visit.    Past Surgical History  Procedure Laterality Date  . Cholecystectomy  2000  . Cesarean section  1996 and 1998  . Tubal ligation  1999  . Knee arthroscopy    . Robotic assisted total hysterectomy  10-15-2010    LAPAROSCOPIC /  LYSIS ADHESIONS  . Extracorporeal shock wave lithotripsy  2003  . Cystoscopy  with retrograde pyelogram, ureteroscopy and stent placement Right 07/25/2013    Procedure: CYSTOSCOPY WITH RETROGRADE PYELOGRAM, URETEROSCOPY, STONE EXTRACTION AND POSSIBLE STENT PLACEMENT;  Surgeon: Ailene Rud, MD;  Location: [facility];  Service: Urology;  Laterality: Right;  . Abdominal hysterectomy    . Embolization N/A 12/26/2014    Procedure: EMBOLIZATION;  Surgeon: Serafina Mitchell, MD;  Location: [facility] CATH LAB;  Service: Cardiovascular;  Laterality: N/A;  . Peripheral vascular catheterization  12/26/2014    Procedure: VISCERAL ANGIOGRAPHY;  Surgeon: Serafina Mitchell, MD;  Location:  [facility] CATH LAB;  Service: Cardiovascular;;  . Video bronchoscopy N/A 02/06/2015    Procedure: VIDEO BRONCHOSCOPY;  Surgeon: Grace Isaac, MD;  Location: [facility] OR;  Service: Thoracic;  Laterality: N/A;  . Video assisted thoracoscopy (vats)/empyema Left 02/06/2015    Procedure: LEFT VIDEO ASSISTED THORACOSCOPY (VATS)/DRAINAGE OF EMPYEMA;  Surgeon: Grace Isaac, MD;  Location: [facility];  Service: Thoracic;  Laterality: Left;  (L)VATS, DRAINAGE OF EMPYEMA    No Known Allergies  History   Social History  . Marital Status: Married    Spouse Name: N/A  . Number of Children: N/A  . Years of Education: N/A   Occupational History  . Not on file.   Social History Main Topics  . Smoking status: Former Smoker -- 1.00 packs/day for 30 years    Types: Cigarettes  . Smokeless tobacco: Never Used  . Alcohol Use: 0.0 oz/week    0 Standard drinks or equivalent per week     Comment: occasionally/socially  . Drug Use: No  . Sexual Activity:    Partners: Male    Birth Control/ Protection: Surgical     Comment: TLH/LOA   Other Topics Concern  . Not on file   Social History Narrative    Family History  Problem Relation Age of Onset  . Diabetes Mother     type 2  . Asthma Mother   . Cancer Mother     skin cancer  . Cancer Father     bone  . Mental illness Sister     schizophrenia  . Alcohol abuse Maternal Grandmother   . Stroke Maternal Grandmother   . Aneurysm Maternal Grandmother     brain aneurysm  . Other Maternal Grandfather     black lung  . Alzheimer's disease Paternal Grandmother   . Stroke Maternal Aunt     BP 117/81 mmHg  Pulse 85  Ht 5\' 2"  (1.575 m)  Wt 192 lb (87.091 kg)  BMI 35.11 kg/m2  LMP 08/10/2010  Review of Systems: See HPI above.    Objective:  Physical Exam:  Gen: NAD  Right shoulder: No swelling, ecchymoses.  No gross deformity. No TTP. FROM with painful arc. Positive Hawkins, Neers. Negative Speeds, Yergasons. Strength 5/5 with empty can  and resisted internal/external rotation.  Pain empty can and ER. Negative apprehension. NV intact distally.  Right foot: Prominence dorsal 1st MTP.  No other gross deformity, swelling, ecchymoses FROM ankle but mild decreased dorsiflexion of 1st MTP. TTP 1st MTP dorsally over bony prominence. Thompsons test negative. NV intact distally.    Assessment & Plan:  1. Right shoulder pain - 2/2 rotator cuff impingement.  Clinically feels improved at 80%.  Will add nitro patches - declined PT for now.  Encouraged home exercises again.  Discussed risks headache, skin irritation with nitro.  F/u in 6 weeks.  2. 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  Discussed shoes with a wide forefoot, buddy taping.  After informed written consent, patient was seated on exam table. Right 1st MTP identified with ultrasound then was prepped with alcohol swab, injected with 0.5:0.27mL marcaine: depomedrol though half given locally around spur. Patient tolerated the procedure well without immediate complications.

## 2015-06-07 NOTE — Assessment & Plan Note (Signed)
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  Discussed shoes with a wide forefoot, buddy taping.  After informed written consent, patient was seated on exam table. Right 1st MTP identified with ultrasound then was prepped with alcohol swab, injected with 0.5:0.30mL marcaine: depomedrol though half given locally around spur. Patient tolerated the procedure well without immediate complications.

## 2015-06-07 NOTE — Assessment & Plan Note (Signed)
2/2 rotator cuff impingement.  Clinically feels improved at 80%.  Will add nitro patches - declined PT for now.  Encouraged home exercises again.  Discussed risks headache, skin irritation with nitro.  F/u in 6 weeks.

## 2015-06-15 ENCOUNTER — Ambulatory Visit: Payer: Managed Care, Other (non HMO) | Admitting: Family Medicine

## 2015-06-18 ENCOUNTER — Encounter: Payer: Self-pay | Admitting: Family Medicine

## 2015-06-18 ENCOUNTER — Ambulatory Visit (INDEPENDENT_AMBULATORY_CARE_PROVIDER_SITE_OTHER): Payer: Managed Care, Other (non HMO) | Admitting: Family Medicine

## 2015-06-18 VITALS — BP 132/80 | HR 72 | Temp 98.3°F | Ht 62.0 in | Wt 195.5 lb

## 2015-06-18 DIAGNOSIS — E039 Hypothyroidism, unspecified: Secondary | ICD-10-CM

## 2015-06-18 DIAGNOSIS — R700 Elevated erythrocyte sedimentation rate: Secondary | ICD-10-CM

## 2015-06-18 DIAGNOSIS — G8918 Other acute postprocedural pain: Secondary | ICD-10-CM | POA: Diagnosis not present

## 2015-06-18 DIAGNOSIS — G609 Hereditary and idiopathic neuropathy, unspecified: Secondary | ICD-10-CM

## 2015-06-18 DIAGNOSIS — D649 Anemia, unspecified: Secondary | ICD-10-CM | POA: Diagnosis not present

## 2015-06-18 DIAGNOSIS — IMO0001 Reserved for inherently not codable concepts without codable children: Secondary | ICD-10-CM

## 2015-06-18 DIAGNOSIS — E663 Overweight: Secondary | ICD-10-CM

## 2015-06-18 DIAGNOSIS — R030 Elevated blood-pressure reading, without diagnosis of hypertension: Secondary | ICD-10-CM

## 2015-06-18 MED ORDER — TRAMADOL HCL 50 MG PO TABS: 50.0000 mg | ORAL_TABLET | Freq: Four times a day (QID) | ORAL | Status: DC | PRN

## 2015-06-18 NOTE — Patient Instructions (Addendum)
Avoid high heels and tight shoes Try Jobst compression stockings. Knee highs on in am and off in pm, light weight, 10-20 mmHG Rub the feet down with Salon Pas gel twice daily So if persists can discuss changing antibiotics because they can contribute to peripheral neuropathy    Peripheral Neuropathy Peripheral neuropathy is a type of nerve damage. It affects nerves that carry signals between the spinal cord and other parts of the body. These are called peripheral nerves. With peripheral neuropathy, one nerve or a group of nerves may be damaged.  CAUSES  Many things can damage peripheral nerves. For some people with peripheral neuropathy, the cause is unknown. Some causes include:  Diabetes. This is the most common cause of peripheral neuropathy.  Injury to a nerve.  Pressure or stress on a nerve that lasts a long time.  Too little vitamin B. Alcoholism can lead to this.  Infections.  Autoimmune diseases, such as multiple sclerosis and systemic lupus erythematosus.  Inherited nerve diseases.  Some medicines, such as cancer drugs.  Toxic substances, such as lead and mercury.  Too little blood flowing to the legs.  Kidney disease.  Thyroid disease. SIGNS AND SYMPTOMS  Different people have different symptoms. The symptoms you have will depend on which of your nerves is damaged. Common symptoms include:  Loss of feeling (numbness) in the feet and hands.  Tingling in the feet and hands.  Pain that burns.  Very sensitive skin.  Weakness.  Not being able to move a part of the body (paralysis).  Muscle twitching.  Clumsiness or poor coordination.  Loss of balance.  Not being able to control your bladder.  Feeling dizzy.  Sexual problems. DIAGNOSIS  Peripheral neuropathy is a symptom, not a disease. Finding the cause of peripheral neuropathy can be hard. To figure that out, your health care provider will take a medical history and do a physical exam. A  neurological exam will also be done. This involves checking things affected by your brain, spinal cord, and nerves (nervous system). For example, your health care provider will check your reflexes, how you move, and what you can feel.  Other types of tests may also be ordered, such as:  Blood tests.  A test of the fluid in your spinal cord.  Imaging tests, such as CT scans or an MRI.  Electromyography (EMG). This test checks the nerves that control muscles.  Nerve conduction velocity tests. These tests check how fast messages pass through your nerves.  Nerve biopsy. A small piece of nerve is removed. It is then checked under a microscope. TREATMENT   Medicine is often used to treat peripheral neuropathy. Medicines may include:  Pain-relieving medicines. Prescription or over-the-counter medicine may be suggested.  Antiseizure medicine. This may be used for pain.  Antidepressants. These also may help ease pain from neuropathy.  Lidocaine. This is a numbing medicine. You might wear a patch or be given a shot.  Mexiletine. This medicine is typically used to help control irregular heart rhythms.  Surgery. Surgery may be needed to relieve pressure on a nerve or to destroy a nerve that is causing pain.  Physical therapy to help movement.  Assistive devices to help movement. HOME CARE INSTRUCTIONS   Only take over-the-counter or prescription medicines as directed by your health care provider. Follow the instructions carefully for any given medicines. Do not take any other medicines without first getting approval from your health care provider.  If you have diabetes, work closely with your  health care provider to keep your blood sugar under control.  If you have numbness in your feet:  Check every day for signs of injury or infection. Watch for redness, warmth, and swelling.  Wear padded socks and comfortable shoes. These help protect your feet.  Do not do things that put pressure  on your damaged nerve.  Do not smoke. Smoking keeps blood from getting to damaged nerves.  Avoid or limit alcohol. Too much alcohol can cause a lack of B vitamins. These vitamins are needed for healthy nerves.  Develop a good support system. Coping with peripheral neuropathy can be stressful. Talk to a mental health specialist or join a support group if you are struggling.  Follow up with your health care provider as directed. SEEK MEDICAL CARE IF:   You have new signs or symptoms of peripheral neuropathy.  You are struggling emotionally from dealing with peripheral neuropathy.  You have a fever. SEEK IMMEDIATE MEDICAL CARE IF:   You have an injury or infection that is not healing.  You feel very dizzy or begin vomiting.  You have chest pain.  You have trouble breathing. Document Released: 10/17/2002 Document Revised: 07/09/2011 Document Reviewed: 07/04/2013 [facility] Patient Information 2015 [place], [place]. This information is not intended to replace advice given to you by your health care provider. Make sure you discuss any questions you have with your health care provider.

## 2015-06-18 NOTE — Progress Notes (Signed)
Pre visit review using our clinic review tool, if applicable. No additional management support is needed unless otherwise documented below in the visit note. 

## 2015-06-19 LAB — CBC
HCT: 46.7 % — ABNORMAL HIGH (ref 36.0–46.0)
Hemoglobin: 15.9 g/dL — ABNORMAL HIGH (ref 12.0–15.0)
MCHC: 34.1 g/dL (ref 30.0–36.0)
MCV: 92.6 fl (ref 78.0–100.0)
PLATELETS: 152 10*3/uL (ref 150.0–400.0)
RBC: 5.05 Mil/uL (ref 3.87–5.11)
RDW: 16.9 % — AB (ref 11.5–15.5)
WBC: 7 10*3/uL (ref 4.0–10.5)

## 2015-06-19 LAB — COMPREHENSIVE METABOLIC PANEL
ALT: 17 U/L (ref 0–35)
AST: 20 U/L (ref 0–37)
Albumin: 4 g/dL (ref 3.5–5.2)
Alkaline Phosphatase: 53 U/L (ref 39–117)
BILIRUBIN TOTAL: 0.4 mg/dL (ref 0.2–1.2)
BUN: 17 mg/dL (ref 6–23)
CO2: 29 meq/L (ref 19–32)
CREATININE: 0.89 mg/dL (ref 0.40–1.20)
Calcium: 9.9 mg/dL (ref 8.4–10.5)
Chloride: 103 mEq/L (ref 96–112)
GFR: 71.68 mL/min (ref >60.00)
GLUCOSE: 96 mg/dL (ref 70–99)
Potassium: 4.9 mEq/L (ref 3.5–5.1)
SODIUM: 138 meq/L (ref 135–145)
Total Protein: 7.3 g/dL (ref 6.0–8.3)

## 2015-06-19 LAB — VITAMIN B12: Vitamin B-12: 609 pg/mL (ref 211–911)

## 2015-06-19 LAB — HEMOGLOBIN A1C: Hgb A1c MFr Bld: 5.5 % (ref 4.6–6.5)

## 2015-06-19 LAB — SEDIMENTATION RATE: Sed Rate: 23 mm/hr — ABNORMAL HIGH (ref 0–22)

## 2015-06-21 LAB — INTRINSIC FACTOR ANTIBODIES: INTRINSIC FACTOR: NEGATIVE

## 2015-07-01 ENCOUNTER — Encounter: Payer: Self-pay | Admitting: Family Medicine

## 2015-07-01 DIAGNOSIS — G609 Hereditary and idiopathic neuropathy, unspecified: Secondary | ICD-10-CM | POA: Insufficient documentation

## 2015-07-01 DIAGNOSIS — R700 Elevated erythrocyte sedimentation rate: Secondary | ICD-10-CM | POA: Insufficient documentation

## 2015-07-01 NOTE — Assessment & Plan Note (Signed)
Well controlled, no changes to meds. Encouraged heart healthy diet such as the DASH diet and exercise as tolerated.  °

## 2015-07-01 NOTE — Assessment & Plan Note (Signed)
May use Tramadol prn for severe pain, labs unremarkable. Encouraged daily MVI

## 2015-07-01 NOTE — Assessment & Plan Note (Signed)
Encouraged DASH diet, decrease po intake and increase exercise as tolerated. Needs 7-8 hours of sleep nightly. Avoid trans fats, eat small, frequent meals every 4-5 hours with lean proteins, complex carbs and healthy fats. Minimize simple carbs, GMO foods. 

## 2015-07-01 NOTE — Assessment & Plan Note (Signed)
On Levothyroxine, continue to monitor 

## 2015-07-01 NOTE — Progress Notes (Signed)
Subjective:    Patient ID: Peggy Thomas, female    DOB: 06/02/1966, 49 y.o.   MRN: [id number]  Chief Complaint  Patient presents with  . Follow-up    Both feet numbness for 2 weeks.    HPI Patient is in today for evaluation of  Numerous concerns. Continues to have trouble with right shoulder and right thumb pain, treated by [facility]. Denies CP/palp/SOB/HA/congestion/fevers/GI or GU c/o. Taking meds as prescribed  Past Medical History  Diagnosis Date  . Hypocalcemia   . Depression 04/30/2011  . Hypothyroidism 04/30/2011  . History of kidney stones   . Right ureteral stone   . Chronic idiopathic thrombocytopenia     MILD--   HEMATOLOGIST--  DR Marin Olp  . PONV (postoperative nausea and vomiting)   . 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 Renal insufficiency   . Nausea without vomiting 03/26/2015  . History of tobacco abuse 04/30/2011  . Anemia 03/31/2015    Past Surgical History  Procedure Laterality Date  . Cholecystectomy  2000  . Cesarean section  1996 and 1998  . Tubal ligation  1999  . Knee arthroscopy    . Robotic assisted total hysterectomy  10-15-2010    LAPAROSCOPIC /  LYSIS ADHESIONS  . Extracorporeal shock wave lithotripsy  2003  . Cystoscopy with retrograde pyelogram, ureteroscopy and stent placement Right 07/25/2013    Procedure: CYSTOSCOPY WITH RETROGRADE PYELOGRAM, URETEROSCOPY, STONE EXTRACTION AND POSSIBLE STENT PLACEMENT;  Surgeon: Ailene Rud, MD;  Location: [facility];  Service: Urology;  Laterality: Right;  . Abdominal hysterectomy    . Embolization N/A 12/26/2014    Procedure: EMBOLIZATION;  Surgeon: Serafina Mitchell, MD;  Location: [facility] CATH LAB;  Service: Cardiovascular;  Laterality: N/A;  . Peripheral vascular catheterization  12/26/2014    Procedure: VISCERAL ANGIOGRAPHY;  Surgeon: Serafina Mitchell, MD;  Location: [facility] CATH LAB;  Service: Cardiovascular;;  . Video bronchoscopy N/A 02/06/2015    Procedure: VIDEO BRONCHOSCOPY;   Surgeon: Grace Isaac, MD;  Location: [facility] OR;  Service: Thoracic;  Laterality: N/A;  . Video assisted thoracoscopy (vats)/empyema Left 02/06/2015    Procedure: LEFT VIDEO ASSISTED THORACOSCOPY (VATS)/DRAINAGE OF EMPYEMA;  Surgeon: Grace Isaac, MD;  Location: [facility] OR;  Service: Thoracic;  Laterality: Left;  (L)VATS, DRAINAGE OF EMPYEMA    Family History  Problem Relation Age of Onset  . Diabetes Mother     type 2  . Asthma Mother   . Cancer Mother     skin cancer  . Cancer Father     bone  . Mental illness Sister     schizophrenia  . Alcohol abuse Maternal Grandmother   . Stroke Maternal Grandmother   . Aneurysm Maternal Grandmother     brain aneurysm  . Other Maternal Grandfather     black lung  . Alzheimer's disease Paternal Grandmother   . Stroke Maternal Aunt     Social History   Social History  . Marital Status: Married    Spouse Name: N/A  . Number of Children: N/A  . Years of Education: N/A   Occupational History  . Not on file.   Social History Main Topics  . Smoking status: Former Smoker -- 1.00 packs/day for 30 years    Types: Cigarettes  . Smokeless tobacco: Never Used  . Alcohol Use: 0.0 oz/week    0 Standard drinks or equivalent per week     Comment: occasionally/socially  . Drug Use:  No  . Sexual Activity:    Partners: Male    Birth Control/ Protection: Surgical     Comment: TLH/LOA   Other Topics Concern  . Not on file   Social History Narrative    Outpatient Prescriptions Prior to Visit  Medication Sig Dispense Refill  . acetaminophen (TYLENOL) 500 MG tablet Take 1 tablet (500 mg total) by mouth every 6 (six) hours as needed for moderate pain. 30 tablet 0  . b complex vitamins tablet Take 1 tablet by mouth as needed (for energy).     Marland Kitchen ibuprofen (ADVIL,MOTRIN) 600 MG tablet TAKE 1 TABLET BY MOUTH EVERY 8 HOURS AS NEEDED 40 tablet 0  . levofloxacin (LEVAQUIN) 500 MG tablet Take 1 tablet (500 mg total) by mouth daily. 30 tablet 6  .  levothyroxine (SYNTHROID, LEVOTHROID) 75 MCG tablet TAKE 1 TABLET (75 MCG TOTAL) BY MOUTH DAILY BEFORE BREAKFAST. 30 tablet 6  . metroNIDAZOLE (FLAGYL) 500 MG tablet Take 1 tablet (500 mg total) by mouth every 8 (eight) hours. 90 tablet 6  . traMADol (ULTRAM) 50 MG tablet Take 1-2 tablets (50-100 mg total) by mouth every 6 (six) hours as needed (mild pain). 30 tablet 0  . diclofenac (VOLTAREN) 75 MG EC tablet Take 1 tablet (75 mg total) by mouth 2 (two) times daily. (Patient not taking: Reported on 06/18/2015) 60 tablet 1  . nitroGLYCERIN (NITRODUR - DOSED IN MG/24 HR) 0.2 mg/hr patch Apply 1/4th patch to affected area, change daily (Patient not taking: Reported on 06/18/2015) 30 patch 1  . promethazine (PHENERGAN) 25 MG tablet TAKE 1 TABLET (25 MG TOTAL) BY MOUTH EVERY 6 (SIX) HOURS AS NEEDED FOR NAUSEA OR VOMITING. (Patient not taking: Reported on 06/18/2015) 90 tablet 0  . LORazepam (ATIVAN) 0.5 MG tablet Take 0.5-1 tablets (0.25-0.5 mg total) by mouth 2 (two) times daily as needed for anxiety. (Patient not taking: Reported on 05/29/2015) 30 tablet 1   No facility-administered medications prior to visit.    No Known Allergies  Review of Systems  Constitutional: Negative for fever and malaise/fatigue.  HENT: Negative for congestion.   Eyes: Negative for discharge.  Respiratory: Negative for shortness of breath.   Cardiovascular: Negative for chest pain, palpitations and leg swelling.  Gastrointestinal: Negative for nausea and abdominal pain.  Genitourinary: Negative for dysuria.  Musculoskeletal: Positive for joint pain. Negative for falls.  Skin: Negative for rash.  Neurological: Positive for sensory change. Negative for loss of consciousness and headaches.  Endo/Heme/Allergies: Negative for environmental allergies.  Psychiatric/Behavioral: Negative for depression. The patient is not nervous/anxious.        Objective:    Physical Exam  Constitutional: She is oriented to person, place, and  time. She appears well-developed and well-nourished. No distress.  HENT:  Head: Normocephalic and atraumatic.  Nose: Nose normal.  Eyes: Right eye exhibits no discharge. Left eye exhibits no discharge.  Neck: Normal range of motion. Neck supple.  Cardiovascular: Normal rate and regular rhythm.   No murmur heard. Pulmonary/Chest: Effort normal and breath sounds normal.  Abdominal: Soft. Bowel sounds are normal. There is no tenderness.  Musculoskeletal: She exhibits no edema.  Neurological: She is alert and oriented to person, place, and time.  Skin: Skin is warm and dry.  Psychiatric: She has a normal mood and affect.  Nursing note and vitals reviewed.   BP 132/80 mmHg  Pulse 72  Temp(Src) 98.3 F (36.8 C) (Oral)  Ht _0  (1.575 m)  Wt 195 lb 8 oz (88.678  kg)  BMI 35.75 kg/m2  SpO2 99%  LMP 08/10/2010 Wt Readings from Last 3 Encounters:  06/18/15 195 lb 8 oz (88.678 kg)  06/07/15 192 lb (87.091 kg)  05/29/15 194 lb (87.998 kg)     Lab Results  Component Value Date   WBC 7.0 06/18/2015   HGB 15.9* 06/18/2015   HCT 46.7* 06/18/2015   PLT 152.0 06/18/2015   GLUCOSE 96 06/18/2015   CHOL 134 03/31/2014   TRIG 85 03/31/2014   HDL 39* 03/31/2014   LDLCALC 78 03/31/2014   ALT 17 06/18/2015   AST 20 06/18/2015   NA 138 06/18/2015   K 4.9 06/18/2015   CL 103 06/18/2015   CREATININE 0.89 06/18/2015   BUN 17 06/18/2015   CO2 29 06/18/2015   TSH 3.840 08/23/2014   INR 1.23 03/03/2015   HGBA1C 5.5 06/18/2015    Lab Results  Component Value Date   TSH 3.840 08/23/2014   Lab Results  Component Value Date   WBC 7.0 06/18/2015   HGB 15.9* 06/18/2015   HCT 46.7* 06/18/2015   MCV 92.6 06/18/2015   PLT 152.0 06/18/2015   Lab Results  Component Value Date   NA 138 06/18/2015   K 4.9 06/18/2015   CO2 29 06/18/2015   GLUCOSE 96 06/18/2015   BUN 17 06/18/2015   CREATININE 0.89 06/18/2015   BILITOT 0.4 06/18/2015   ALKPHOS 53 06/18/2015   AST 20 06/18/2015   ALT  17 06/18/2015   PROT 7.3 06/18/2015   ALBUMIN 4.0 06/18/2015   CALCIUM 9.9 06/18/2015   ANIONGAP 8 03/06/2015   GFR 71.68 06/18/2015   Lab Results  Component Value Date   CHOL 134 03/31/2014   Lab Results  Component Value Date   HDL 39* 03/31/2014   Lab Results  Component Value Date   LDLCALC 78 03/31/2014   Lab Results  Component Value Date   TRIG 85 03/31/2014   Lab Results  Component Value Date   CHOLHDL 3.4 03/31/2014   Lab Results  Component Value Date   HGBA1C 5.5 06/18/2015       Assessment & Plan:   Problem List Items Addressed This Visit    Overweight    Encouraged DASH diet, decrease po intake and increase exercise as tolerated. Needs 7-8 hours of sleep nightly. Avoid trans fats, eat small, frequent meals every 4-5 hours with lean proteins, complex carbs and healthy fats. Minimize simple carbs, GMO foods.      Hypothyroidism    On Levothyroxine, continue to monitor      Hereditary and idiopathic peripheral neuropathy    May use Tramadol prn for severe pain, labs unremarkable. Encouraged daily MVI      Relevant Medications   traMADol (ULTRAM) 50 MG tablet   Other Relevant Orders   Sed Rate (ESR) (Completed)   CBC (Completed)   Comp Met (CMET) (Completed)   Hemoglobin A1c (Completed)   Intrinsic Factor Antibodies (Completed)   Vitamin B12 (Completed)   Elevated sed rate    Improving with treatment of splenic abscess      Relevant Medications   traMADol (ULTRAM) 50 MG tablet   Other Relevant Orders   Sed Rate (ESR) (Completed)   CBC (Completed)   Comp Met (CMET) (Completed)   Hemoglobin A1c (Completed)   Intrinsic Factor Antibodies (Completed)   Vitamin B12 (Completed)   Elevated BP    Well controlled, no changes to meds. Encouraged heart healthy diet such as the DASH diet and exercise as  tolerated.       Anemia   Relevant Medications   traMADol (ULTRAM) 50 MG tablet   Other Relevant Orders   Sed Rate (ESR) (Completed)   CBC  (Completed)   Comp Met (CMET) (Completed)   Hemoglobin A1c (Completed)   Intrinsic Factor Antibodies (Completed)   Vitamin B12 (Completed)    Other Visit Diagnoses    Post-op pain    -  Primary    Relevant Medications    traMADol (ULTRAM) 50 MG tablet    Other Relevant Orders    Sed Rate (ESR) (Completed)    CBC (Completed)    Comp Met (CMET) (Completed)    Hemoglobin A1c (Completed)    Intrinsic Factor Antibodies (Completed)    Vitamin B12 (Completed)       I have discontinued Ms. Pittinger's LORazepam. I am also having her maintain her b complex vitamins, acetaminophen, levothyroxine, diclofenac, promethazine, ibuprofen, levofloxacin, metroNIDAZOLE, nitroGLYCERIN, and traMADol.  Meds ordered this encounter  Medications  . traMADol (ULTRAM) 50 MG tablet    Sig: Take 1-2 tablets (50-100 mg total) by mouth every 6 (six) hours as needed (mild pain).    Dispense:  30 tablet    Refill:  0     Penni Homans, MD

## 2015-07-01 NOTE — Assessment & Plan Note (Signed)
Improving with treatment of splenic abscess

## 2015-07-06 ENCOUNTER — Telehealth: Payer: Self-pay | Admitting: Family Medicine

## 2015-07-06 MED ORDER — GABAPENTIN 100 MG PO CAPS: ORAL_CAPSULE | ORAL | Status: DC

## 2015-07-06 NOTE — Telephone Encounter (Signed)
°  Relation to OI:TGPQ Call back number:[phone] Pharmacy:[facility]  Reason for call: pt saw dr. Charlett Blake last week, for tingling/burning in her feet, pt states that she followed the directions dr. Charlett Blake provided her but the pain is still there and the burning, wants to know if dr. b can call her something in.

## 2015-07-06 NOTE — Telephone Encounter (Signed)
Sent prescription into [facility]. Called the patient left message to call back

## 2015-07-06 NOTE — Telephone Encounter (Signed)
So her lab work for causes was unremarkable. Would recommend Gabapentin to treat peripheral neuropathy, start 100 mg tabs, 1 tab po daily x 7 days then 2 tabs po daily x 7 days then 3 tabs daily, disp #90 with 1 rf and come back in reeval in 4-6 weeks. We are starting a low dose but it can go higher if need be and no side effects. The most common side effect is fatigue.

## 2015-07-06 NOTE — Telephone Encounter (Signed)
Called the patient informed of PCP instructions regarding medication.  Patient agreed to all and has appt. Already on 08/09/15 and can discuss at that time.

## 2015-07-13 ENCOUNTER — Encounter: Payer: Self-pay | Admitting: Family Medicine

## 2015-07-13 ENCOUNTER — Ambulatory Visit (INDEPENDENT_AMBULATORY_CARE_PROVIDER_SITE_OTHER): Payer: Managed Care, Other (non HMO) | Admitting: Family Medicine

## 2015-07-13 VITALS — BP 148/97 | HR 91 | Ht 62.0 in | Wt 192.0 lb

## 2015-07-13 DIAGNOSIS — M25561 Pain in right knee: Secondary | ICD-10-CM | POA: Diagnosis not present

## 2015-07-13 MED ORDER — METHYLPREDNISOLONE ACETATE 40 MG/ML IJ SUSP
40.0000 mg | Freq: Once | INTRAMUSCULAR | Status: AC
  Administered 2015-07-13: 40 mg via INTRA_ARTICULAR

## 2015-07-13 NOTE — Patient Instructions (Signed)
I'm worried you have a loose body from the meniscus or articular cartilage of your knee. A plica is another consideration. We will try conservative treatment for this, hope this is just a plica or a meniscus tear we can calm down with the injection, icing. Ice 15 minutes at a time 3-4 times a day. Ibuprofen or aleve if you can take these. Follow up with me in 1 month for reevaluation but you can call me sooner if things aren't getting better - would consider xrays/MRI then, possible arthroscopy.

## 2015-07-17 DIAGNOSIS — M25561 Pain in right knee: Secondary | ICD-10-CM | POA: Insufficient documentation

## 2015-07-17 NOTE — Progress Notes (Signed)
PCP: Penni Homans, MD  Subjective:   HPI: Patient is a 49 y.o. female here for right knee pain.  Patient reports toe has improved - now right knee bothering her past 1 1/2 weeks. Reports she was just stretching when lying down and felt a pop anterior knee. + swelling. Difficulty moving initially. Feels like something is moving when she goes from bending to extending. Pain mainly deep, lateral, goes posterior. + crunching and catching. No locking or giving out. Worse with stairs. Pain level 6/10.  Past Medical History  Diagnosis Date  . Hypocalcemia   . Depression 04/30/2011  . Hypothyroidism 04/30/2011  . History of kidney stones   . Right ureteral stone   . Chronic idiopathic thrombocytopenia     MILD--   HEMATOLOGIST--  DR Marin Olp  . PONV (postoperative nausea and vomiting)   . 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 Renal insufficiency   . Nausea without vomiting 03/26/2015  . History of tobacco abuse 04/30/2011  . Anemia 03/31/2015    Current Outpatient Prescriptions on File Prior to Visit  Medication Sig Dispense Refill  . acetaminophen (TYLENOL) 500 MG tablet Take 1 tablet (500 mg total) by mouth every 6 (six) hours as needed for moderate pain. 30 tablet 0  . b complex vitamins tablet Take 1 tablet by mouth as needed (for energy).     Marland Kitchen diclofenac (VOLTAREN) 75 MG EC tablet Take 1 tablet (75 mg total) by mouth 2 (two) times daily. (Patient not taking: Reported on 06/18/2015) 60 tablet 1  . gabapentin (NEURONTIN) 100 MG capsule Take 1 by mouth daily for 7 days, then 2 by mouth daily for 7 days, then take 3 by mouth daily. 90 capsule 1  . ibuprofen (ADVIL,MOTRIN) 600 MG tablet TAKE 1 TABLET BY MOUTH EVERY 8 HOURS AS NEEDED 40 tablet 0  . levofloxacin (LEVAQUIN) 500 MG tablet Take 1 tablet (500 mg total) by mouth daily. 30 tablet 6  . levothyroxine (SYNTHROID, LEVOTHROID) 75 MCG tablet TAKE 1 TABLET (75 MCG TOTAL) BY MOUTH DAILY BEFORE BREAKFAST. 30 tablet 6  . metroNIDAZOLE  (FLAGYL) 500 MG tablet Take 1 tablet (500 mg total) by mouth every 8 (eight) hours. 90 tablet 6  . nitroGLYCERIN (NITRODUR - DOSED IN MG/24 HR) 0.2 mg/hr patch Apply 1/4th patch to affected area, change daily (Patient not taking: Reported on 06/18/2015) 30 patch 1  . promethazine (PHENERGAN) 25 MG tablet TAKE 1 TABLET (25 MG TOTAL) BY MOUTH EVERY 6 (SIX) HOURS AS NEEDED FOR NAUSEA OR VOMITING. (Patient not taking: Reported on 06/18/2015) 90 tablet 0  . traMADol (ULTRAM) 50 MG tablet Take 1-2 tablets (50-100 mg total) by mouth every 6 (six) hours as needed (mild pain). 30 tablet 0   No current facility-administered medications on file prior to visit.    Past Surgical History  Procedure Laterality Date  . Cholecystectomy  2000  . Cesarean section  1996 and 1998  . Tubal ligation  1999  . Knee arthroscopy    . Robotic assisted total hysterectomy  10-15-2010    LAPAROSCOPIC /  LYSIS ADHESIONS  . Extracorporeal shock wave lithotripsy  2003  . Cystoscopy with retrograde pyelogram, ureteroscopy and stent placement Right 07/25/2013    Procedure: CYSTOSCOPY WITH RETROGRADE PYELOGRAM, URETEROSCOPY, STONE EXTRACTION AND POSSIBLE STENT PLACEMENT;  Surgeon: Ailene Rud, MD;  Location: [facility];  Service: Urology;  Laterality: Right;  . Abdominal hysterectomy    . Embolization N/A 12/26/2014  Procedure: EMBOLIZATION;  Surgeon: Serafina Mitchell, MD;  Location: [facility] CATH LAB;  Service: Cardiovascular;  Laterality: N/A;  . Peripheral vascular catheterization  12/26/2014    Procedure: VISCERAL ANGIOGRAPHY;  Surgeon: Serafina Mitchell, MD;  Location: [facility] CATH LAB;  Service: Cardiovascular;;  . Video bronchoscopy N/A 02/06/2015    Procedure: VIDEO BRONCHOSCOPY;  Surgeon: Grace Isaac, MD;  Location: [facility] OR;  Service: Thoracic;  Laterality: N/A;  . Video assisted thoracoscopy (vats)/empyema Left 02/06/2015    Procedure: LEFT VIDEO ASSISTED THORACOSCOPY (VATS)/DRAINAGE OF EMPYEMA;  Surgeon:  Grace Isaac, MD;  Location: [place];  Service: Thoracic;  Laterality: Left;  (L)VATS, DRAINAGE OF EMPYEMA    No Known Allergies  Social History   Social History  . Marital Status: Married    Spouse Name: N/A  . Number of Children: N/A  . Years of Education: N/A   Occupational History  . Not on file.   Social History Main Topics  . Smoking status: Former Smoker -- 1.00 packs/day for 30 years    Types: Cigarettes  . Smokeless tobacco: Never Used  . Alcohol Use: 0.0 oz/week    0 Standard drinks or equivalent per week     Comment: occasionally/socially  . Drug Use: No  . Sexual Activity:    Partners: Male    Birth Control/ Protection: Surgical     Comment: TLH/LOA   Other Topics Concern  . Not on file   Social History Narrative    Family History  Problem Relation Age of Onset  . Diabetes Mother     type 2  . Asthma Mother   . Cancer Mother     skin cancer  . Cancer Father     bone  . Mental illness Sister     schizophrenia  . Alcohol abuse Maternal Grandmother   . Stroke Maternal Grandmother   . Aneurysm Maternal Grandmother     brain aneurysm  . Other Maternal Grandfather     black lung  . Alzheimer's disease Paternal Grandmother   . Stroke Maternal Aunt     BP 148/97 mmHg  Pulse 91  Ht 5\' 2"  (1.575 m)  Wt 192 lb (87.091 kg)  BMI 35.11 kg/m2  LMP 08/10/2010  Review of Systems: See HPI above.    Objective:  Physical Exam:  Gen: NAD  Right knee: Mild effusion.  No bruising.  Able to palpate a piece of ?soft tissue lateral to right patella that painfully shifts with extension => flexion.   No other tenderness. FROM. Negative ant/post drawers. Negative valgus/varus testing. Negative lachmanns. Negative mcmurrays, apleys.  Pain with patellar apprehension. NV intact distally.  MSK u/s:  Area of pain, soft tissue catching does not correspond to a visible calcification    Assessment & Plan:  1. Right knee injury - I'm very concerned she has a  loose body that is causing her symptoms - palpable mass that shifts with extension to flexion - possibly from articular cartilage off posterior patella.  Other considerations would be meniscal or a plica though wouldn't expect the latter to be traumatic.  Greatest concern is she is still on antibiotics for splenic infection - she would not be a surgical candidate right now and she does not want to take on any additional risk.  We discussed options and decided to try an intraarticular injection for symptomatic relief, see how she does over the next 1-2 weeks.  Advised even given her situation if she does not improve I would  recommend we go ahead with further imaging (x-rays then MRI) and consultation with surgery.  After informed written consent, patient was seated on exam table. Right knee was prepped with alcohol swab and utilizing superolateral approach with ultrasound guidance, patient's right knee was injected intraarticularly with 3:1 marcaine: depomedrol. Patient tolerated the procedure well without immediate complications.

## 2015-07-17 NOTE — Assessment & Plan Note (Signed)
I'm very concerned she has a loose body that is causing her symptoms - palpable mass that shifts with extension to flexion - possibly from articular cartilage off posterior patella.  Other considerations would be meniscal or a plica though wouldn't expect the latter to be traumatic.  Greatest concern is she is still on antibiotics for splenic infection - she would not be a surgical candidate right now and she does not want to take on any additional risk.  We discussed options and decided to try an intraarticular injection for symptomatic relief, see how she does over the next 1-2 weeks.  Advised even given her situation if she does not improve I would recommend we go ahead with further imaging (x-rays then MRI) and consultation with surgery.  After informed written consent, patient was seated on exam table. Right knee was prepped with alcohol swab and utilizing superolateral approach with ultrasound guidance, patient's right knee was injected intraarticularly with 3:1 marcaine: depomedrol. Patient tolerated the procedure well without immediate complications.

## 2015-07-19 ENCOUNTER — Ambulatory Visit: Payer: Managed Care, Other (non HMO) | Admitting: Family Medicine

## 2015-07-23 ENCOUNTER — Telehealth: Payer: Self-pay | Admitting: *Deleted

## 2015-07-23 NOTE — Telephone Encounter (Signed)
Has ABD CT scan been scheduled?  Pt asking if oral contrast is necessary?  Please return the pt's call. MD, Pt also c/o bilateral LE and hand neuropathy. Pt is wondering if this could be related to ABX?

## 2015-07-24 NOTE — Telephone Encounter (Signed)
I would think oral contrast would be helpful. I had planned on repeat scan in 3 months from July which would be in October

## 2015-07-30 ENCOUNTER — Other Ambulatory Visit: Payer: Managed Care, Other (non HMO)

## 2015-07-31 ENCOUNTER — Ambulatory Visit (INDEPENDENT_AMBULATORY_CARE_PROVIDER_SITE_OTHER): Payer: Managed Care, Other (non HMO) | Admitting: Family Medicine

## 2015-07-31 ENCOUNTER — Encounter: Payer: Self-pay | Admitting: Family Medicine

## 2015-07-31 VITALS — BP 114/72 | HR 65 | Temp 98.1°F | Ht 62.0 in | Wt 202.5 lb

## 2015-07-31 DIAGNOSIS — G609 Hereditary and idiopathic neuropathy, unspecified: Secondary | ICD-10-CM | POA: Diagnosis not present

## 2015-07-31 DIAGNOSIS — G8918 Other acute postprocedural pain: Secondary | ICD-10-CM | POA: Diagnosis not present

## 2015-07-31 DIAGNOSIS — R700 Elevated erythrocyte sedimentation rate: Secondary | ICD-10-CM | POA: Diagnosis not present

## 2015-07-31 DIAGNOSIS — D649 Anemia, unspecified: Secondary | ICD-10-CM

## 2015-07-31 DIAGNOSIS — D733 Abscess of spleen: Secondary | ICD-10-CM

## 2015-07-31 DIAGNOSIS — R030 Elevated blood-pressure reading, without diagnosis of hypertension: Secondary | ICD-10-CM

## 2015-07-31 DIAGNOSIS — IMO0001 Reserved for inherently not codable concepts without codable children: Secondary | ICD-10-CM

## 2015-07-31 DIAGNOSIS — N289 Disorder of kidney and ureter, unspecified: Secondary | ICD-10-CM

## 2015-07-31 MED ORDER — GABAPENTIN 100 MG PO CAPS: ORAL_CAPSULE | ORAL | Status: DC

## 2015-07-31 MED ORDER — TRAMADOL HCL 50 MG PO TABS: 50.0000 mg | ORAL_TABLET | Freq: Four times a day (QID) | ORAL | Status: DC | PRN

## 2015-07-31 NOTE — Progress Notes (Signed)
Pre visit review using our clinic review tool, if applicable. No additional management support is needed unless otherwise documented below in the visit note. 

## 2015-07-31 NOTE — Patient Instructions (Addendum)
Start a vitamin B complex daily  Consider Ginger for nausea, try caps, chews, ale, tea etc   Peripheral Neuropathy Peripheral neuropathy is a type of nerve damage. It affects nerves that carry signals between the spinal cord and other parts of the body. These are called peripheral nerves. With peripheral neuropathy, one nerve or a group of nerves may be damaged.  CAUSES  Many things can damage peripheral nerves. For some people with peripheral neuropathy, the cause is unknown. Some causes include:  Diabetes. This is the most common cause of peripheral neuropathy.  Injury to a nerve.  Pressure or stress on a nerve that lasts a long time.  Too little vitamin B. Alcoholism can lead to this.  Infections.  Autoimmune diseases, such as multiple sclerosis and systemic lupus erythematosus.  Inherited nerve diseases.  Some medicines, such as cancer drugs.  Toxic substances, such as lead and mercury.  Too little blood flowing to the legs.  Kidney disease.  Thyroid disease. SIGNS AND SYMPTOMS  Different people have different symptoms. The symptoms you have will depend on which of your nerves is damaged. Common symptoms include:  Loss of feeling (numbness) in the feet and hands.  Tingling in the feet and hands.  Pain that burns.  Very sensitive skin.  Weakness.  Not being able to move a part of the body (paralysis).  Muscle twitching.  Clumsiness or poor coordination.  Loss of balance.  Not being able to control your bladder.  Feeling dizzy.  Sexual problems. DIAGNOSIS  Peripheral neuropathy is a symptom, not a disease. Finding the cause of peripheral neuropathy can be hard. To figure that out, your health care provider will take a medical history and do a physical exam. A neurological exam will also be done. This involves checking things affected by your brain, spinal cord, and nerves (nervous system). For example, your health care provider will check your reflexes,  how you move, and what you can feel.  Other types of tests may also be ordered, such as:  Blood tests.  A test of the fluid in your spinal cord.  Imaging tests, such as CT scans or an MRI.  Electromyography (EMG). This test checks the nerves that control muscles.  Nerve conduction velocity tests. These tests check how fast messages pass through your nerves.  Nerve biopsy. A small piece of nerve is removed. It is then checked under a microscope. TREATMENT   Medicine is often used to treat peripheral neuropathy. Medicines may include:  Pain-relieving medicines. Prescription or over-the-counter medicine may be suggested.  Antiseizure medicine. This may be used for pain.  Antidepressants. These also may help ease pain from neuropathy.  Lidocaine. This is a numbing medicine. You might wear a patch or be given a shot.  Mexiletine. This medicine is typically used to help control irregular heart rhythms.  Surgery. Surgery may be needed to relieve pressure on a nerve or to destroy a nerve that is causing pain.  Physical therapy to help movement.  Assistive devices to help movement. HOME CARE INSTRUCTIONS   Only take over-the-counter or prescription medicines as directed by your health care provider. Follow the instructions carefully for any given medicines. Do not take any other medicines without first getting approval from your health care provider.  If you have diabetes, work closely with your health care provider to keep your blood sugar under control.  If you have numbness in your feet:  Check every day for signs of injury or infection. Watch for redness,  warmth, and swelling.  Wear padded socks and comfortable shoes. These help protect your feet.  Do not do things that put pressure on your damaged nerve.  Do not smoke. Smoking keeps blood from getting to damaged nerves.  Avoid or limit alcohol. Too much alcohol can cause a lack of B vitamins. These vitamins are needed for  healthy nerves.  Develop a good support system. Coping with peripheral neuropathy can be stressful. Talk to a mental health specialist or join a support group if you are struggling.  Follow up with your health care provider as directed. SEEK MEDICAL CARE IF:   You have new signs or symptoms of peripheral neuropathy.  You are struggling emotionally from dealing with peripheral neuropathy.  You have a fever. SEEK IMMEDIATE MEDICAL CARE IF:   You have an injury or infection that is not healing.  You feel very dizzy or begin vomiting.  You have chest pain.  You have trouble breathing. Document Released: 10/17/2002 Document Revised: 07/09/2011 Document Reviewed: 07/04/2013 [facility] Patient Information 2015 [place], [place]. This information is not intended to replace advice given to you by your health care provider. Make sure you discuss any questions you have with your health care provider.

## 2015-08-02 ENCOUNTER — Telehealth: Payer: Self-pay | Admitting: Family Medicine

## 2015-08-02 DIAGNOSIS — D733 Abscess of spleen: Secondary | ICD-10-CM

## 2015-08-02 NOTE — Telephone Encounter (Signed)
Caller name: Jessicca Stitzer  Relationship to patient: self  Can be reached: [phone] Pharmacy:  Reason for call: pt is returning your call.Marland Kitchen

## 2015-08-02 NOTE — Telephone Encounter (Signed)
CBC entered for [facility] office.

## 2015-08-06 ENCOUNTER — Ambulatory Visit: Payer: Managed Care, Other (non HMO) | Admitting: Surgery

## 2015-08-07 ENCOUNTER — Encounter: Payer: Self-pay | Admitting: Family Medicine

## 2015-08-07 NOTE — Assessment & Plan Note (Signed)
Stable, will continue to monitor and patient reminded to maintain adequate hydration.

## 2015-08-07 NOTE — Assessment & Plan Note (Addendum)
D/c Flagyl and continue Gabapentin qhs for now, Tramadol prn

## 2015-08-07 NOTE — Progress Notes (Signed)
Subjective:    Patient ID: Peggy Thomas, female    DOB: [birth year], 49 y.o.   MRN: [id number]  Chief Complaint  Patient presents with  . Follow-up    HPI Patient is in today for follow up. She continues to complain of burning and tingling in b/l feet and lower legs. Gets some mild relief from Gabapentin, Tramadol but symptoms are persistent. Otherwise she is feeling well. No fevers, chills, abdominal pain or new concerns. Denies CP/palp/SOB/HA/congestion/fevers/GI or GU c/o. Taking meds as prescribed  Past Medical History  Diagnosis Date  . Hypocalcemia   . Depression 04/30/2011  . Hypothyroidism 04/30/2011  . History of kidney stones   . Right ureteral stone   . Chronic idiopathic thrombocytopenia     MILD--   HEMATOLOGIST--  DR Marin Olp  . PONV (postoperative nausea and vomiting)   . 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 Renal insufficiency   . Nausea without vomiting 03/26/2015  . History of tobacco abuse 04/30/2011  . Anemia 03/31/2015    Past Surgical History  Procedure Laterality Date  . Cholecystectomy  2000  . Cesarean section  1996 and 1998  . Tubal ligation  1999  . Knee arthroscopy    . Robotic assisted total hysterectomy  10-15-2010    LAPAROSCOPIC /  LYSIS ADHESIONS  . Extracorporeal shock wave lithotripsy  2003  . Cystoscopy with retrograde pyelogram, ureteroscopy and stent placement Right 07/25/2013    Procedure: CYSTOSCOPY WITH RETROGRADE PYELOGRAM, URETEROSCOPY, STONE EXTRACTION AND POSSIBLE STENT PLACEMENT;  Surgeon: Ailene Rud, MD;  Location: [facility];  Service: Urology;  Laterality: Right;  . Abdominal hysterectomy    . Embolization N/A 12/26/2014    Procedure: EMBOLIZATION;  Surgeon: Serafina Mitchell, MD;  Location: [facility] CATH LAB;  Service: Cardiovascular;  Laterality: N/A;  . Peripheral vascular catheterization  12/26/2014    Procedure: VISCERAL ANGIOGRAPHY;  Surgeon: Serafina Mitchell, MD;  Location: [facility] CATH LAB;  Service:  Cardiovascular;;  . Video bronchoscopy N/A 02/06/2015    Procedure: VIDEO BRONCHOSCOPY;  Surgeon: Grace Isaac, MD;  Location: [facility] OR;  Service: Thoracic;  Laterality: N/A;  . Video assisted thoracoscopy (vats)/empyema Left 02/06/2015    Procedure: LEFT VIDEO ASSISTED THORACOSCOPY (VATS)/DRAINAGE OF EMPYEMA;  Surgeon: Grace Isaac, MD;  Location: [facility] OR;  Service: Thoracic;  Laterality: Left;  (L)VATS, DRAINAGE OF EMPYEMA    Family History  Problem Relation Age of Onset  . Diabetes Mother     type 2  . Asthma Mother   . Cancer Mother     skin cancer  . Cancer Father     bone  . Mental illness Sister     schizophrenia  . Alcohol abuse Maternal Grandmother   . Stroke Maternal Grandmother   . Aneurysm Maternal Grandmother     brain aneurysm  . Other Maternal Grandfather     black lung  . Alzheimer's disease Paternal Grandmother   . Stroke Maternal Aunt     Social History   Social History  . Marital Status: Married    Spouse Name: N/A  . Number of Children: N/A  . Years of Education: N/A   Occupational History  . Not on file.   Social History Main Topics  . Smoking status: Former Smoker -- 1.00 packs/day for 30 years    Types: Cigarettes  . Smokeless tobacco: Never Used  . Alcohol Use: 0.0 oz/week    0 Standard drinks or equivalent per week  Comment: occasionally/socially  . Drug Use: No  . Sexual Activity:    Partners: Male    Birth Control/ Protection: Surgical     Comment: TLH/LOA   Other Topics Concern  . Not on file   Social History Narrative    Outpatient Prescriptions Prior to Visit  Medication Sig Dispense Refill  . acetaminophen (TYLENOL) 500 MG tablet Take 1 tablet (500 mg total) by mouth every 6 (six) hours as needed for moderate pain. 30 tablet 0  . b complex vitamins tablet Take 1 tablet by mouth as needed (for energy).     Marland Kitchen ibuprofen (ADVIL,MOTRIN) 600 MG tablet TAKE 1 TABLET BY MOUTH EVERY 8 HOURS AS NEEDED 40 tablet 0  .  levofloxacin (LEVAQUIN) 500 MG tablet Take 1 tablet (500 mg total) by mouth daily. 30 tablet 6  . levothyroxine (SYNTHROID, LEVOTHROID) 75 MCG tablet TAKE 1 TABLET (75 MCG TOTAL) BY MOUTH DAILY BEFORE BREAKFAST. 30 tablet 6  . metroNIDAZOLE (FLAGYL) 500 MG tablet Take 1 tablet (500 mg total) by mouth every 8 (eight) hours. 90 tablet 6  . gabapentin (NEURONTIN) 100 MG capsule Take 1 by mouth daily for 7 days, then 2 by mouth daily for 7 days, then take 3 by mouth daily. 90 capsule 1  . traMADol (ULTRAM) 50 MG tablet Take 1-2 tablets (50-100 mg total) by mouth every 6 (six) hours as needed (mild pain). 30 tablet 0  . diclofenac (VOLTAREN) 75 MG EC tablet Take 1 tablet (75 mg total) by mouth 2 (two) times daily. (Patient not taking: Reported on 06/18/2015) 60 tablet 1  . nitroGLYCERIN (NITRODUR - DOSED IN MG/24 HR) 0.2 mg/hr patch Apply 1/4th patch to affected area, change daily (Patient not taking: Reported on 06/18/2015) 30 patch 1  . promethazine (PHENERGAN) 25 MG tablet TAKE 1 TABLET (25 MG TOTAL) BY MOUTH EVERY 6 (SIX) HOURS AS NEEDED FOR NAUSEA OR VOMITING. (Patient not taking: Reported on 06/18/2015) 90 tablet 0   No facility-administered medications prior to visit.    No Known Allergies  Review of Systems  Constitutional: Negative for fever and malaise/fatigue.  HENT: Negative for congestion.   Eyes: Negative for discharge.  Respiratory: Negative for shortness of breath.   Cardiovascular: Negative for chest pain, palpitations and leg swelling.  Gastrointestinal: Negative for nausea and abdominal pain.  Genitourinary: Negative for dysuria.  Musculoskeletal: Negative for falls.  Skin: Negative for rash.  Neurological: Positive for tingling and sensory change. Negative for loss of consciousness and headaches.  Endo/Heme/Allergies: Negative for environmental allergies.  Psychiatric/Behavioral: Negative for depression. The patient is not nervous/anxious.        Objective:    Physical Exam    Constitutional: She is oriented to person, place, and time. She appears well-developed and well-nourished. No distress.  HENT:  Head: Normocephalic and atraumatic.  Nose: Nose normal.  Eyes: Right eye exhibits no discharge. Left eye exhibits no discharge.  Neck: Normal range of motion. Neck supple.  Cardiovascular: Normal rate and regular rhythm.   No murmur heard. Pulmonary/Chest: Effort normal and breath sounds normal.  Abdominal: Soft. Bowel sounds are normal. There is no tenderness.  Musculoskeletal: She exhibits no edema.  Neurological: She is alert and oriented to person, place, and time.  Skin: Skin is warm and dry.  Psychiatric: She has a normal mood and affect.  Nursing note and vitals reviewed.   BP 114/72 mmHg  Pulse 65  Temp(Src) 98.1 F (36.7 C) (Oral)  Ht 5\' 2"  (1.575 m)  Wt  202 lb 8 oz (91.853 kg)  BMI 37.03 kg/m2  SpO2 96%  LMP 08/10/2010 Wt Readings from Last 3 Encounters:  07/31/15 202 lb 8 oz (91.853 kg)  07/13/15 192 lb (87.091 kg)  06/18/15 195 lb 8 oz (88.678 kg)     Lab Results  Component Value Date   WBC 7.0 06/18/2015   HGB 15.9* 06/18/2015   HCT 46.7* 06/18/2015   PLT 152.0 06/18/2015   GLUCOSE 96 06/18/2015   CHOL 134 03/31/2014   TRIG 85 03/31/2014   HDL 39* 03/31/2014   LDLCALC 78 03/31/2014   ALT 17 06/18/2015   AST 20 06/18/2015   NA 138 06/18/2015   K 4.9 06/18/2015   CL 103 06/18/2015   CREATININE 0.89 06/18/2015   BUN 17 06/18/2015   CO2 29 06/18/2015   TSH 3.840 08/23/2014   INR 1.23 03/03/2015   HGBA1C 5.5 06/18/2015    Lab Results  Component Value Date   TSH 3.840 08/23/2014   Lab Results  Component Value Date   WBC 7.0 06/18/2015   HGB 15.9* 06/18/2015   HCT 46.7* 06/18/2015   MCV 92.6 06/18/2015   PLT 152.0 06/18/2015   Lab Results  Component Value Date   NA 138 06/18/2015   K 4.9 06/18/2015   CO2 29 06/18/2015   GLUCOSE 96 06/18/2015   BUN 17 06/18/2015   CREATININE 0.89 06/18/2015   BILITOT 0.4  06/18/2015   ALKPHOS 53 06/18/2015   AST 20 06/18/2015   ALT 17 06/18/2015   PROT 7.3 06/18/2015   ALBUMIN 4.0 06/18/2015   CALCIUM 9.9 06/18/2015   ANIONGAP 8 03/06/2015   GFR 71.68 06/18/2015   Lab Results  Component Value Date   CHOL 134 03/31/2014   Lab Results  Component Value Date   HDL 39* 03/31/2014   Lab Results  Component Value Date   LDLCALC 78 03/31/2014   Lab Results  Component Value Date   TRIG 85 03/31/2014   Lab Results  Component Value Date   CHOLHDL 3.4 03/31/2014   Lab Results  Component Value Date   HGBA1C 5.5 06/18/2015       Assessment & Plan:   Problem List Items Addressed This Visit    Splenic abscess    Symptomatically improving but having ongoing trouble with peripheral neuropathy. ID has agreed it is likely related to Flagyl use. We will stop it, repeat CBC in next couple of weeks and patient will report any return of symptoms. She has an appt for follow up CT and ID appt next month for surveillance      Renal insufficiency    Stable, will continue to monitor and patient reminded to maintain adequate hydration.      Hereditary and idiopathic peripheral neuropathy    D/c Flagyl and continue Gabapentin qhs for now, Tramadol prn      Relevant Medications   traMADol (ULTRAM) 50 MG tablet   gabapentin (NEURONTIN) 100 MG capsule   Elevated sed rate   Relevant Medications   traMADol (ULTRAM) 50 MG tablet   Elevated BP    Well controlled. Encouraged heart healthy diet such as the DASH diet and exercise as tolerated.       Anemia   Relevant Medications   traMADol (ULTRAM) 50 MG tablet    Other Visit Diagnoses    Post-op pain    -  Primary    Relevant Medications    traMADol (ULTRAM) 50 MG tablet       I  have discontinued Peggy Thomas's diclofenac, promethazine, and nitroGLYCERIN. I have also changed her traMADol and gabapentin. Additionally, I am having her maintain her b complex vitamins, acetaminophen, levothyroxine, ibuprofen,  levofloxacin, and metroNIDAZOLE.  Meds ordered this encounter  Medications  . traMADol (ULTRAM) 50 MG tablet    Sig: Take 1-2 tablets (50-100 mg total) by mouth every 6 (six) hours as needed for moderate pain or severe pain (mild pain).    Dispense:  60 tablet    Refill:  0  . gabapentin (NEURONTIN) 100 MG capsule    Sig: 3 caps po qhs and 1 tab po qam    Dispense:  120 capsule    Refill:  2     Penni Homans, MD

## 2015-08-07 NOTE — Assessment & Plan Note (Signed)
Symptomatically improving but having ongoing trouble with peripheral neuropathy. ID has agreed it is likely related to Flagyl use. We will stop it, repeat CBC in next couple of weeks and patient will report any return of symptoms. She has an appt for follow up CT and ID appt next month for surveillance

## 2015-08-07 NOTE — Telephone Encounter (Signed)
Patient does not want contrast to drink can she please have this test with out contrast?

## 2015-08-07 NOTE — Telephone Encounter (Signed)
She has the right to do whatever she wants. I thought drinking contrast would be helpful in distinguishing bowel from other areas in her GI tract including her spleen

## 2015-08-07 NOTE — Assessment & Plan Note (Signed)
Well controlled. Encouraged heart healthy diet such as the DASH diet and exercise as tolerated.  

## 2015-08-09 ENCOUNTER — Other Ambulatory Visit (INDEPENDENT_AMBULATORY_CARE_PROVIDER_SITE_OTHER): Payer: Managed Care, Other (non HMO)

## 2015-08-09 DIAGNOSIS — D733 Abscess of spleen: Secondary | ICD-10-CM | POA: Diagnosis not present

## 2015-08-09 LAB — CBC WITH DIFFERENTIAL/PLATELET
BASOS PCT: 0.4 % (ref 0.0–3.0)
Basophils Absolute: 0 10*3/uL (ref 0.0–0.1)
EOS ABS: 0.1 10*3/uL (ref 0.0–0.7)
Eosinophils Relative: 2.1 % (ref 0.0–5.0)
HEMATOCRIT: 44.7 % (ref 36.0–46.0)
Hemoglobin: 15 g/dL (ref 12.0–15.0)
LYMPHS ABS: 2 10*3/uL (ref 0.7–4.0)
LYMPHS PCT: 37.1 % (ref 12.0–46.0)
MCHC: 33.6 g/dL (ref 30.0–36.0)
MCV: 97.2 fl (ref 78.0–100.0)
MONOS PCT: 6.4 % (ref 3.0–12.0)
Monocytes Absolute: 0.3 10*3/uL (ref 0.1–1.0)
NEUTROS ABS: 2.9 10*3/uL (ref 1.4–7.7)
NEUTROS PCT: 54 % (ref 43.0–77.0)
PLATELETS: 130 10*3/uL — AB (ref 150.0–400.0)
RBC: 4.6 Mil/uL (ref 3.87–5.11)
RDW: 14 % (ref 11.5–15.5)
WBC: 5.4 10*3/uL (ref 4.0–10.5)

## 2015-08-10 NOTE — Telephone Encounter (Signed)
I have called the patient twice and left messages to see if she wants to drink the contrast or not after leaving her the message from Dr. Tommy Medal. I have not heard from her so therefore I am unable to schedule the CT until I know with or without contrast. I will get it preauthorized in case patient calls next week.

## 2015-08-10 NOTE — Telephone Encounter (Signed)
Why dont we authorize it with oral contrast and if she refuses to drink it then just do it without ?

## 2015-08-13 ENCOUNTER — Ambulatory Visit: Payer: Managed Care, Other (non HMO) | Admitting: Family Medicine

## 2015-08-13 NOTE — Telephone Encounter (Signed)
Ok perfect

## 2015-08-13 NOTE — Telephone Encounter (Signed)
She called and said that she will drink the contrast.

## 2015-08-14 ENCOUNTER — Telehealth: Payer: Self-pay | Admitting: Infectious Disease

## 2015-08-14 NOTE — Telephone Encounter (Signed)
Prior authorization for CT of abdomen and pelvis was previously denied due to insufficient information. I called to speak with a nurse to give more information and the case is now in reconsideration. She will let us know by fax in 24 hours.

## 2015-08-28 ENCOUNTER — Telehealth: Payer: Self-pay | Admitting: *Deleted

## 2015-08-28 NOTE — Telephone Encounter (Signed)
Patient called and advised she received a call stating she would need to be rescheduled from 09/05/15 as Dr Tommy Medal is not going to be available that day. She advised she has an MRI scheduled 09/03/15 and was to see the doctor 09/05/15 and was told the next available 10/10/15. She does not want to wait that long for a follow up visit. Advised the patient will have to ask the doctor and give her a call back asap.

## 2015-08-28 NOTE — Telephone Encounter (Signed)
No you still have 5 patients scheduled.

## 2015-08-28 NOTE — Telephone Encounter (Signed)
CT abdomen authorized [id number], effective 9/30 - 12/29.  Pt scheduled on 10/24.

## 2015-08-28 NOTE — Telephone Encounter (Signed)
I can contact her after her CT scan has been done. Has my clinic been completely cancelled for that day?

## 2015-09-03 ENCOUNTER — Telehealth: Payer: Self-pay | Admitting: *Deleted

## 2015-09-03 ENCOUNTER — Ambulatory Visit
Admission: RE | Admit: 2015-09-03 | Discharge: 2015-09-03 | Disposition: A | Discharge status: Home / Self Care | Payer: Managed Care, Other (non HMO) | Source: Ambulatory Visit | Attending: Infectious Disease | Admitting: Infectious Disease

## 2015-09-03 DIAGNOSIS — D733 Abscess of spleen: Secondary | ICD-10-CM

## 2015-09-03 MED ORDER — IOPAMIDOL (ISOVUE-300) INJECTION 61%
100.0000 mL | Freq: Once | INTRAVENOUS | Status: AC | PRN
  Administered 2015-09-03: 100 mL via INTRAVENOUS

## 2015-09-03 NOTE — Telephone Encounter (Signed)
Per Dr Tommy Medal called the patient and left a message for her to stop her medication and that her CT looked great.

## 2015-09-03 NOTE — Telephone Encounter (Signed)
-----   Message from Peggy Hayward, MD sent at 09/03/2015 12:10 PM EDT ----- Please let pt know her CT looks GREAT and abscesses are gone. She can stop her antibiotics completely!

## 2015-09-05 ENCOUNTER — Ambulatory Visit: Payer: Managed Care, Other (non HMO) | Admitting: Infectious Disease

## 2015-09-18 ENCOUNTER — Telehealth: Payer: Self-pay | Admitting: Surgery

## 2015-09-18 NOTE — Telephone Encounter (Addendum)
Lm on pt's hm# for them to resch appt  ----- Message from Serafina Mitchell, MD sent at 09/18/2015  3:32 PM EST ----- Regarding: RE: keep f/u appt? Yes please ----- Message -----    From: Georgiann Mccoy    Sent: 09/13/2015   9:48 AM      To: Serafina Mitchell, MD Subject: keep f/u appt?                                 Ms. Furr called and said that she is seeing Dr. Tommy Medal with the [facility] and she had a CT on 09/03/15 that showed the infections was gone.  She would like to know if she should keep her appt here.  Thank you, Selinda Eon

## 2015-10-01 ENCOUNTER — Ambulatory Visit: Payer: Managed Care, Other (non HMO) | Admitting: Surgery

## 2015-10-03 ENCOUNTER — Ambulatory Visit: Payer: Managed Care, Other (non HMO) | Admitting: Surgery

## 2015-10-30 ENCOUNTER — Ambulatory Visit: Payer: Managed Care, Other (non HMO) | Admitting: Family Medicine

## 2015-11-15 ENCOUNTER — Other Ambulatory Visit: Payer: Self-pay | Admitting: Family Medicine

## 2015-11-16 NOTE — Telephone Encounter (Signed)
Faxed hardcopy for Tramadol to [facility] In [facility]

## 2015-11-21 ENCOUNTER — Other Ambulatory Visit: Payer: Self-pay | Admitting: Family Medicine

## 2015-12-11 ENCOUNTER — Other Ambulatory Visit: Payer: Self-pay | Admitting: Family Medicine

## 2015-12-31 ENCOUNTER — Encounter: Payer: Self-pay | Admitting: Family Medicine

## 2015-12-31 ENCOUNTER — Ambulatory Visit (INDEPENDENT_AMBULATORY_CARE_PROVIDER_SITE_OTHER): Payer: Managed Care, Other (non HMO) | Admitting: Family Medicine

## 2015-12-31 VITALS — BP 138/96 | HR 90 | Temp 98.2°F | Resp 16 | Ht 62.0 in | Wt 206.2 lb

## 2015-12-31 DIAGNOSIS — L819 Disorder of pigmentation, unspecified: Secondary | ICD-10-CM | POA: Diagnosis not present

## 2015-12-31 DIAGNOSIS — D751 Secondary polycythemia: Secondary | ICD-10-CM

## 2015-12-31 NOTE — Progress Notes (Addendum)
[facility]  Date:  12/31/2015   Name:  Peggy Thomas   DOB:  June 28, 1966   MRN:  [id number]  PCP:  Penni Homans, MD    Chief Complaint: Skin Discoloration   History of Present Illness:  Peggy Thomas is a 50 y.o. very pleasant female patient who presents with the following:  Here today with concern of a skin change on her LEFT leg.  She was wearing jeans this- exchanged them for shorts a couple of hours ago because it was warm and noted an issue with her left thigh. She does not have any pain or itching but has a mottling of the skin on the medial thigh and a bit on the medial calf.  She otherwise feels fine, but was worried as she had a lot of health problems last year and was not sure if this might be related.  She has not noted any other skin lesions.   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  She had to be maintained on broad spectrum abx for several months.  She was able to stop these as of about 3 months ago She also developed peripheral neuropathy which she describes as a numb feeling in her feet, worst in the big toes.  This is thought to be due to long term use of flagyl which she is no longer taking.    She did see the dentist earlier today but did not have any injections, etc.  Just a cleaning She cannot think of anything else that could have caused this skin finding on her leg- no trauma, etc She is here with her husband today- they are a bit stressed as her mother is hospitalized in [place] and they may need to go and take care of her within the next day or so.    Patient Active Problem List   Diagnosis Date Noted  . Right knee pain 07/17/2015  . Hereditary and idiopathic peripheral neuropathy 07/01/2015  . Elevated sed rate 07/01/2015  . Anemia 03/31/2015  . Right shoulder pain 03/30/2015  .  Nausea without vomiting 03/26/2015  . 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 S/P lung surgery, follow-up exam   . Empyema ([place]) 02/06/2015  . Pleural empyema ([place]) 02/05/2015  . Dyspnea   . Pleural effusion on left   . Splenic abscess 01/31/2015  . Vaginal pain 12/03/2014  . Toe pain, right 04/03/2014  . Edema 04/03/2014  . Renal insufficiency 04/03/2014  . Elevated BP 04/03/2014  . Hypothyroidism 04/03/2014  . Splenic artery aneurysm ([place]) 09/20/2013  . Thrombocytopenia ([place]) 05/29/2011  . Depression 04/30/2011  . History of tobacco abuse 04/30/2011  . Uterus, adenomyosis 04/30/2011  . Tendonitis   . Fatigue   . Hypocalcemia   . Overweight     Past Medical History  Diagnosis Date  . Hypocalcemia   . Depression 04/30/2011  . Hypothyroidism 04/30/2011  . History of kidney stones   . Right ureteral stone   . Chronic idiopathic thrombocytopenia (HCC)     MILD--   HEMATOLOGIST--  DR Marin Olp  . PONV (postoperative nausea and vomiting)   . Aneurysm ([place])     on spleen-watching   . Renal insufficiency   . Nausea without vomiting 03/26/2015  . History of tobacco abuse 04/30/2011  . Anemia 03/31/2015    Past  Surgical History  Procedure Laterality Date  . Cholecystectomy  2000  . Cesarean section  1996 and 1998  . Tubal ligation  1999  . Knee arthroscopy    . Robotic assisted total hysterectomy  10-15-2010    LAPAROSCOPIC /  LYSIS ADHESIONS  . Extracorporeal shock wave lithotripsy  2003  . Cystoscopy with retrograde pyelogram, ureteroscopy and stent placement Right 07/25/2013    Procedure: CYSTOSCOPY WITH RETROGRADE PYELOGRAM, URETEROSCOPY, STONE EXTRACTION AND POSSIBLE STENT PLACEMENT;  Surgeon: Ailene Rud, MD;  Location: [facility];  Service: Urology;  Laterality: Right;  . Abdominal hysterectomy    . Embolization N/A 12/26/2014    Procedure: EMBOLIZATION;  Surgeon: Serafina Mitchell, MD;  Location: [facility] CATH LAB;  Service:  Cardiovascular;  Laterality: N/A;  . Peripheral vascular catheterization  12/26/2014    Procedure: VISCERAL ANGIOGRAPHY;  Surgeon: Serafina Mitchell, MD;  Location: [facility] CATH LAB;  Service: Cardiovascular;;  . Video bronchoscopy N/A 02/06/2015    Procedure: VIDEO BRONCHOSCOPY;  Surgeon: Grace Isaac, MD;  Location: [facility] OR;  Service: Thoracic;  Laterality: N/A;  . Video assisted thoracoscopy (vats)/empyema Left 02/06/2015    Procedure: LEFT VIDEO ASSISTED THORACOSCOPY (VATS)/DRAINAGE OF EMPYEMA;  Surgeon: Grace Isaac, MD;  Location: [place];  Service: Thoracic;  Laterality: Left;  (L)VATS, DRAINAGE OF EMPYEMA    Social History  Substance Use Topics  . Smoking status: Current Every Day Smoker -- 0.75 packs/day for 30 years    Types: Cigarettes  . Smokeless tobacco: Never Used  . Alcohol Use: 0.0 oz/week    0 Standard drinks or equivalent per week     Comment: occasionally/socially    Family History  Problem Relation Age of Onset  . Diabetes Mother     type 2  . Asthma Mother   . Cancer Mother     skin cancer  . Cancer Father     bone  . Mental illness Sister     schizophrenia  . Alcohol abuse Maternal Grandmother   . Stroke Maternal Grandmother   . Aneurysm Maternal Grandmother     brain aneurysm  . Other Maternal Grandfather     black lung  . Alzheimer's disease Paternal Grandmother   . Stroke Maternal Aunt     No Known Allergies  Medication list has been reviewed and updated.  Current Outpatient Prescriptions on File Prior to Visit  Medication Sig Dispense Refill  . acetaminophen (TYLENOL) 500 MG tablet Take 1 tablet (500 mg total) by mouth every 6 (six) hours as needed for moderate pain. 30 tablet 0  . b complex vitamins tablet Take 1 tablet by mouth as needed (for energy).     Marland Kitchen ibuprofen (ADVIL,MOTRIN) 600 MG tablet TAKE 1 TABLET BY MOUTH EVERY 8 HOURS AS NEEDED 40 tablet 0  . levothyroxine (SYNTHROID, LEVOTHROID) 75 MCG tablet TAKE 1 TABLET (75 MCG TOTAL) BY  MOUTH DAILY BEFORE BREAKFAST. 30 tablet 6  . traMADol (ULTRAM) 50 MG tablet TAKE 1-2 TALETS BY MOUTH EVERY 6 HOURS AS NEEDED FOR MODERATE PAIN 60 tablet 0   No current facility-administered medications on file prior to visit.    Review of Systems:  As per HPI- otherwise negative.   Physical Examination: Filed Vitals:   12/31/15 1604  BP: 138/96  Pulse: 100  Temp: 98.2 F (36.8 C)  Resp: 16   Filed Vitals:   12/31/15 1604  Height: '5\' 2"'  (1.575 m)  Weight: 206 lb 3.2 oz (93.532 kg)   Body  mass index is 37.71 kg/(m^2). Ideal Body Weight: Weight in (lb) to have BMI = 25: 136.4  GEN: WDWN, NAD, Non-toxic, A & O x 3, obese, looks well HEENT: Atraumatic, Normocephalic. Neck supple. No masses, No LAD.  No oropharyngeal lesions Ears and Nose: No external deformity. CV: RRR, No M/G/R. No JVD. No thrill. No extra heart sounds. PULM: CTA B, no wheezes, crackles, rhonchi. No retractions. No resp. distress. No accessory muscle use. ABD: S, NT, ND EXTR: No c/c/e NEURO Normal gait.  PSYCH: Normally interactive. Conversant. Not depressed or anxious appearing.  Calm demeanor.  On the LEFT medial thigh there is an area of mottled appearing skin.  There is a smaller and more subtle area on the medial calf.   It it not red, hot or tender.  No lesions or vesicles.  The calves are not swollen or tender.  Normal DP pulses of both feet, she notes her baseline neuropathy of the toes but nothing different.  No pain with ROM of the left knee or ankle  Assessment and Plan: Discoloration of skin of lower leg - Plan: CBC with Differential/Platelet, Comprehensive metabolic panel, Sed Rate (ESR), C-reactive protein, US Venous Img Lower Unilateral Left, RPR  Here today with an unusual skin finding.  Does not appear to be anything acutely dangerous, but she is understandably concerned and wants to be sure all is well Labs pending as above and set up a doppler for her tomorrow.   I will call her with her  labs asap- she will seek care if any worsening or change in her sx  Signed Lamar Blinks, MD  Called pt with results of her labs so far 2/21- went over them on the phone.  Looks fine except for slightly low platelet count and elevated hemoglobin.  Asked her to come in for a repeat CBC in 2-3 weeks to recheck  Results for orders placed or performed in visit on 12/31/15  C-reactive protein  Result Value Ref Range   CRP 0.6 0.5 - 20.0 mg/dL  CBC with Differential  Result Value Ref Range   WBC 8.7 4.0 - 10.5 K/uL   RBC 5.02 3.87 - 5.11 Mil/uL   Hemoglobin 16.3 (H) 12.0 - 15.0 g/dL   HCT 48.3 (H) 36.0 - 46.0 %   MCV 96.0 78.0 - 100.0 fl   MCHC 33.7 30.0 - 36.0 g/dL   RDW 13.1 11.5 - 15.5 %   Platelets 137.0 (L) 150.0 - 400.0 K/uL   Neutrophils Relative % 68.9 43.0 - 77.0 %   Lymphocytes Relative 25.3 12.0 - 46.0 %   Monocytes Relative 4.3 3.0 - 12.0 %   Eosinophils Relative 1.3 0.0 - 5.0 %   Basophils Relative 0.2 0.0 - 3.0 %   Neutro Abs 6.0 1.4 - 7.7 K/uL   Lymphs Abs 2.2 0.7 - 4.0 K/uL   Monocytes Absolute 0.4 0.1 - 1.0 K/uL   Eosinophils Absolute 0.1 0.0 - 0.7 K/uL   Basophils Absolute 0.0 0.0 - 0.1 K/uL  Comp Met (CMET)  Result Value Ref Range   Sodium 142 135 - 145 mEq/L   Potassium 4.7 3.5 - 5.1 mEq/L   Chloride 109 96 - 112 mEq/L   CO2 29 19 - 32 mEq/L   Glucose, Bld 88 70 - 99 mg/dL   BUN 13 6 - 23 mg/dL   Creatinine, Ser 0.93 0.40 - 1.20 mg/dL   Total Bilirubin 0.3 0.2 - 1.2 mg/dL   Alkaline Phosphatase 57 39 - 117  U/L   AST 26 0 - 37 U/L   ALT 37 (H) 0 - 35 U/L   Total Protein 7.4 6.0 - 8.3 g/dL   Albumin 4.5 3.5 - 5.2 g/dL   Calcium 9.8 8.4 - 10.5 mg/dL   GFR 67.98 >60.00 mL/min  Sed Rate (ESR)  Result Value Ref Range   Sed Rate 2 0 - 22 mm/hr

## 2015-12-31 NOTE — Patient Instructions (Signed)
You have an ultrasound scheduled tomorrow at 8:30 am at the [facility]- please arrive around 8:10.  The tech will give you the unofficial report and I will call with the official report!  I will be in touch with your labs asap- please let me know if there is any change or worsening of your symptoms

## 2015-12-31 NOTE — Progress Notes (Signed)
Pre visit review using our clinic review tool, if applicable. No additional management support is needed unless otherwise documented below in the visit note. 

## 2016-01-01 ENCOUNTER — Ambulatory Visit (HOSPITAL_BASED_OUTPATIENT_CLINIC_OR_DEPARTMENT_OTHER)
Admission: RE | Admit: 2016-01-01 | Discharge: 2016-01-01 | Disposition: A | Discharge status: Home / Self Care | Payer: Managed Care, Other (non HMO) | Source: Ambulatory Visit | Attending: Family Medicine | Admitting: Family Medicine

## 2016-01-01 ENCOUNTER — Telehealth: Payer: Self-pay | Admitting: Behavioral Health

## 2016-01-01 DIAGNOSIS — L819 Disorder of pigmentation, unspecified: Secondary | ICD-10-CM | POA: Diagnosis not present

## 2016-01-01 LAB — CBC WITH DIFFERENTIAL/PLATELET
Basophils Absolute: 0 10*3/uL (ref 0.0–0.1)
Basophils Relative: 0.2 % (ref 0.0–3.0)
EOS ABS: 0.1 10*3/uL (ref 0.0–0.7)
EOS PCT: 1.3 % (ref 0.0–5.0)
HCT: 48.3 % — ABNORMAL HIGH (ref 36.0–46.0)
Hemoglobin: 16.3 g/dL — ABNORMAL HIGH (ref 12.0–15.0)
LYMPHS ABS: 2.2 10*3/uL (ref 0.7–4.0)
Lymphocytes Relative: 25.3 % (ref 12.0–46.0)
MCHC: 33.7 g/dL (ref 30.0–36.0)
MCV: 96 fl (ref 78.0–100.0)
MONO ABS: 0.4 10*3/uL (ref 0.1–1.0)
Monocytes Relative: 4.3 % (ref 3.0–12.0)
NEUTROS PCT: 68.9 % (ref 43.0–77.0)
Neutro Abs: 6 10*3/uL (ref 1.4–7.7)
Platelets: 137 10*3/uL — ABNORMAL LOW (ref 150.0–400.0)
RBC: 5.02 Mil/uL (ref 3.87–5.11)
RDW: 13.1 % (ref 11.5–15.5)
WBC: 8.7 10*3/uL (ref 4.0–10.5)

## 2016-01-01 LAB — COMPREHENSIVE METABOLIC PANEL
ALT: 37 U/L — AB (ref 0–35)
AST: 26 U/L (ref 0–37)
Albumin: 4.5 g/dL (ref 3.5–5.2)
Alkaline Phosphatase: 57 U/L (ref 39–117)
BILIRUBIN TOTAL: 0.3 mg/dL (ref 0.2–1.2)
BUN: 13 mg/dL (ref 6–23)
CALCIUM: 9.8 mg/dL (ref 8.4–10.5)
CHLORIDE: 109 meq/L (ref 96–112)
CO2: 29 meq/L (ref 19–32)
CREATININE: 0.93 mg/dL (ref 0.40–1.20)
GFR: 67.98 mL/min (ref >60.00)
GLUCOSE: 88 mg/dL (ref 70–99)
Potassium: 4.7 mEq/L (ref 3.5–5.1)
SODIUM: 142 meq/L (ref 135–145)
Total Protein: 7.4 g/dL (ref 6.0–8.3)

## 2016-01-01 LAB — RPR

## 2016-01-01 LAB — C-REACTIVE PROTEIN: CRP: 0.6 mg/dL (ref 0.5–20.0)

## 2016-01-01 LAB — SEDIMENTATION RATE: Sed Rate: 2 mm/hr (ref 0–22)

## 2016-01-01 NOTE — Telephone Encounter (Signed)
Caller: Vania Rea from Radiology, [place] [phone]  Reason for call: Ultrasound Results  Per Vania Rea, ultrasound was negative for DVT. Also, she wanted to make the provider aware that preliminary findings were requested, but they are not allowed to do so. Provider was not present in office today. Consulted with Dr. Etter Sjogren, who reviewed the findings. Patient informed of her results and allowed to leave for her trip to [place].

## 2016-01-01 NOTE — Addendum Note (Signed)
Addended by: Lamar Blinks C on: 01/01/2016 04:01 PM   Modules accepted: Orders

## 2016-01-01 NOTE — Addendum Note (Signed)
Addended by: Peggyann Shoals on: 01/01/2016 01:14 PM   Modules accepted: Orders

## 2016-01-14 ENCOUNTER — Other Ambulatory Visit: Payer: Self-pay | Admitting: Family Medicine

## 2016-07-28 IMAGING — CT CT ANGIO ABDOMEN
4 of 10 series · 12 of 36 positions shown, 15 images · IV contrast (75CC ISOVUE 370)
Comparison: CT scan of November 27, 2014.

CLINICAL DATA: Status post coil embolization of splenic artery
aneurysm.

EXAM:
CT ANGIOGRAPHY ABDOMEN
TECHNIQUE: Multidetector CT imaging of the abdomen was performed using the
standard protocol during bolus administration of intravenous
contrast. Multiplanar reconstructed images including MIPs were
obtained and reviewed to evaluate the vascular anatomy.
CONTRAST:  75 mL of Isovue 370 intravenously.

[Series 5: arterial (id) · axial · arterial · 0.78mm/px · z∈[-120,-15]mm · 2 of 127 slices shown]
[im 43/127  soft-tissue]
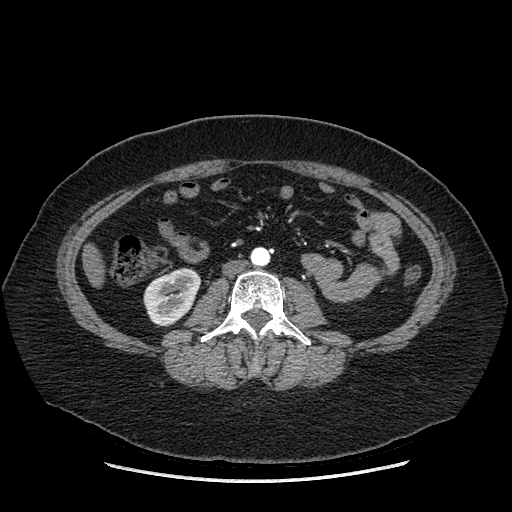
[im 85/127  soft-tissue]
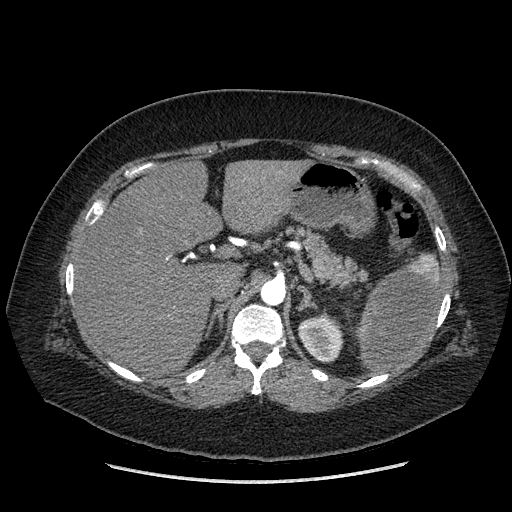

[Series 8: portal venous 5mm · axial · portal-venous · 0.78mm/px · z∈[-225,+90]mm · 2 of 64 slices shown, 5 images]
[im 1/64  soft-tissue]
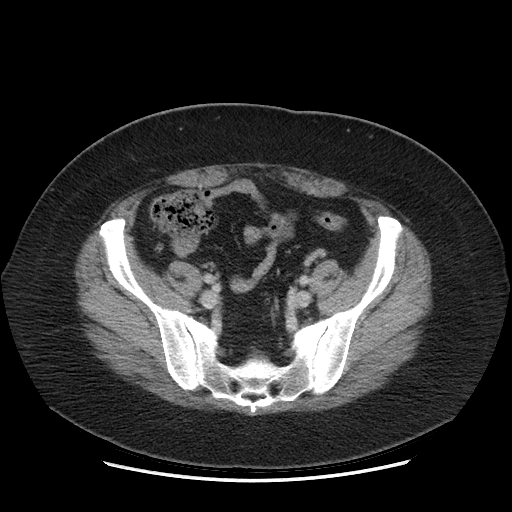
[im 1/64  lung]
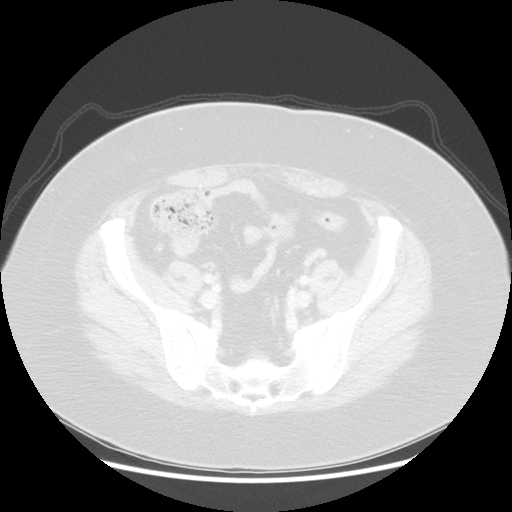
[im 1/64  bone]
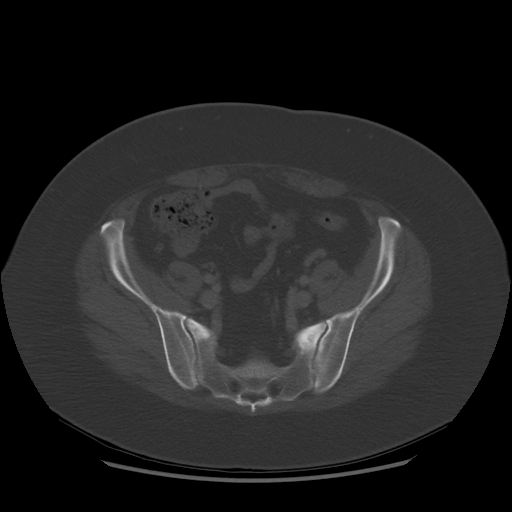
[im 64/64  soft-tissue]
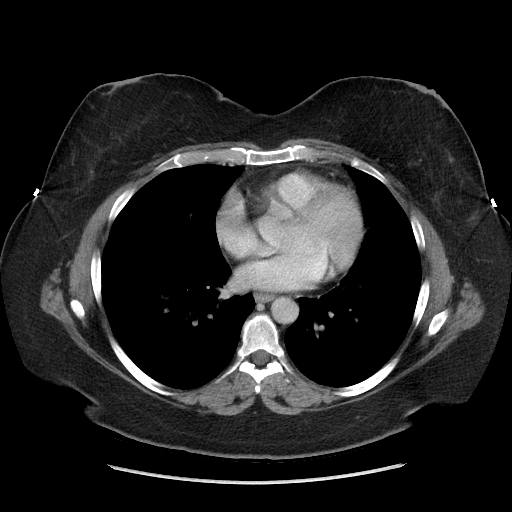
[im 64/64  lung]
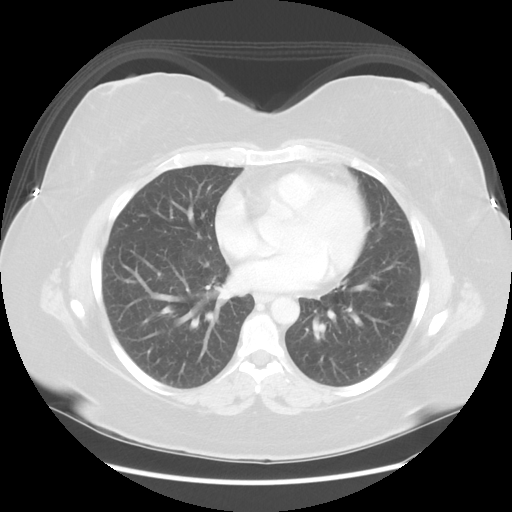

[Series 602: sagittal body · sagittal · 0.78mm/px · 4 of 161 slices shown (1 of 2)]
[im 33/161  soft-tissue]
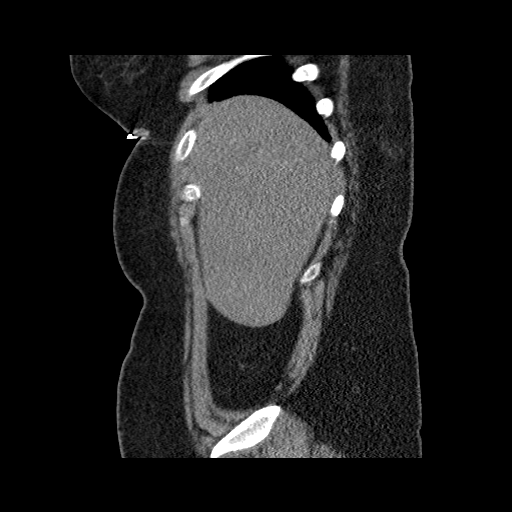
[im 65/161  soft-tissue]
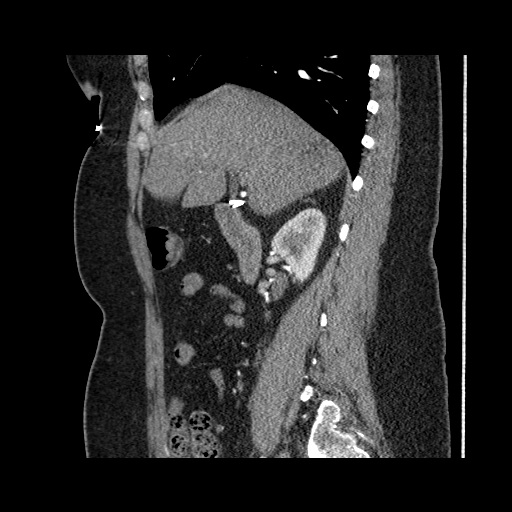
[im 97/161  soft-tissue]
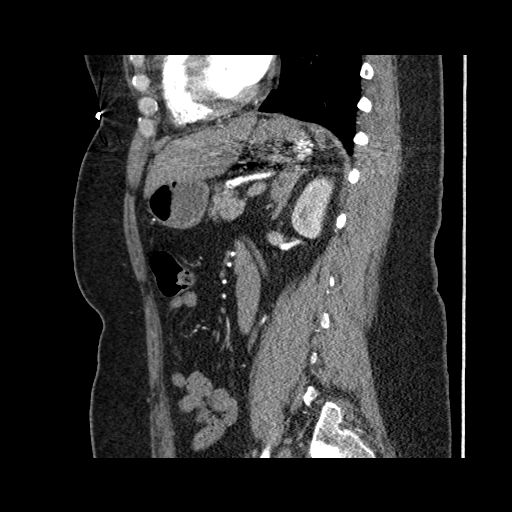
[im 129/161  soft-tissue]
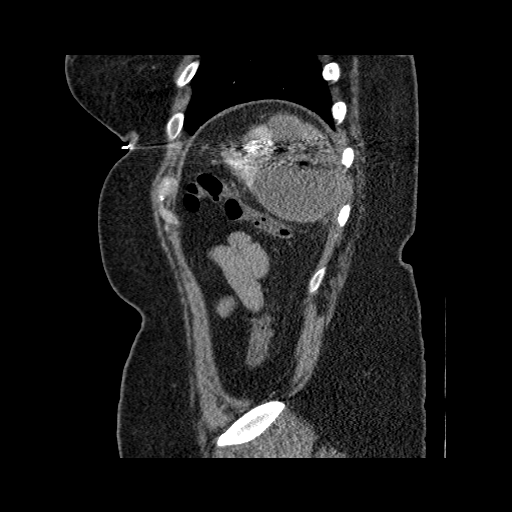

[Series 607: sagittal body · sagittal · 0.78mm/px · 4 of 161 slices shown (2 of 2)]
[im 33/161  soft-tissue]
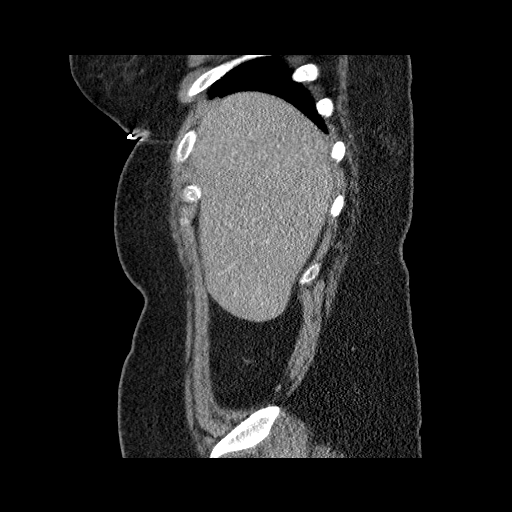
[im 65/161  soft-tissue]
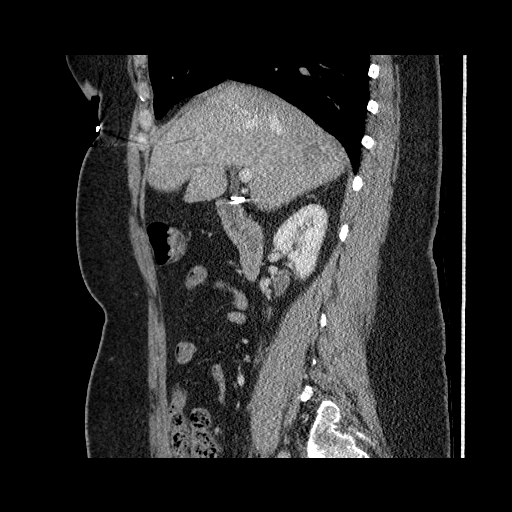
[im 97/161  soft-tissue]
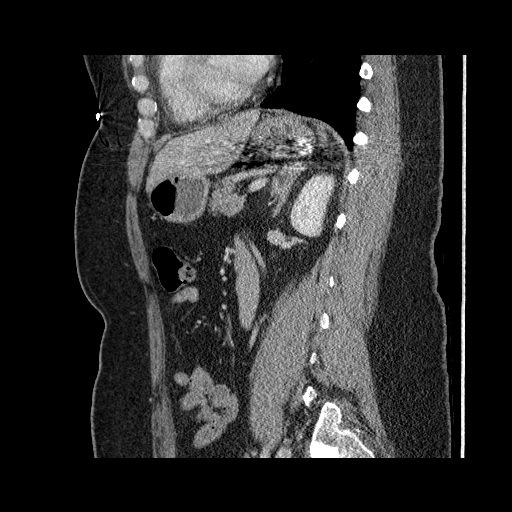
[im 129/161  soft-tissue]
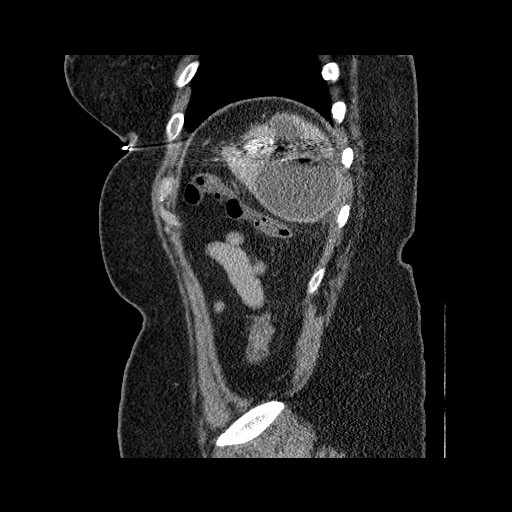

[12 of 36 positions shown; findings below may reference images not displayed]

FINDINGS: Visualized lung bases appear normal. No significant osseous
abnormality is noted.

Visualized portion of abdominal aorta and mesenteric arteries appear
normal. Renal arteries appear normal with symmetric enhancement of
the kidneys. Status post cholecystectomy. No focal abnormality is
noted in the liver or pancreas. Adrenal glands appear normal. Stable
nonobstructive left nephrolithiasis. No hydronephrosis or renal
obstruction is noted. The appendix appears normal.

Status post coil embolization of aneurysm involving distal splenic
artery. Evaluation of this area is limited due to scatter artifact
arising from coils. However, 8.9 x 5.2 cm low density area is now
seen inferiorly in the spleen which is developed since prior exam.
Given the reason embolization, this is concerning for subacute to
chronic infarction. Alternatively, this may represent septated fluid
collection and possible abscess.

Review of the MIP images confirms the above findings.
IMPRESSION: Stable nonobstructive left nephrolithiasis.

Status post coil embolization of distal splenic artery aneurysm.
There is interval development of oval-shaped low density inferiorly
within the spleen which may represent subacute to chronic
infarction, or possibly septated fluid collection, or possibly
abscess if there are clinical signs of infection. These results will
be called to the ordering clinician or representative by the
Radiologist Assistant, and communication documented in the PACS or
zVision Dashboard.

## 2016-08-30 ENCOUNTER — Other Ambulatory Visit: Payer: Self-pay | Admitting: Family Medicine

## 2017-04-30 ENCOUNTER — Ambulatory Visit (INDEPENDENT_AMBULATORY_CARE_PROVIDER_SITE_OTHER): Payer: Managed Care, Other (non HMO) | Admitting: Family Medicine

## 2017-04-30 ENCOUNTER — Encounter: Payer: Self-pay | Admitting: Family Medicine

## 2017-04-30 DIAGNOSIS — J0100 Acute maxillary sinusitis, unspecified: Secondary | ICD-10-CM | POA: Diagnosis not present

## 2017-04-30 DIAGNOSIS — E039 Hypothyroidism, unspecified: Secondary | ICD-10-CM

## 2017-04-30 DIAGNOSIS — D649 Anemia, unspecified: Secondary | ICD-10-CM | POA: Diagnosis not present

## 2017-04-30 DIAGNOSIS — T7840XD Allergy, unspecified, subsequent encounter: Secondary | ICD-10-CM | POA: Diagnosis not present

## 2017-04-30 DIAGNOSIS — J329 Chronic sinusitis, unspecified: Secondary | ICD-10-CM

## 2017-04-30 DIAGNOSIS — N289 Disorder of kidney and ureter, unspecified: Secondary | ICD-10-CM | POA: Diagnosis not present

## 2017-04-30 DIAGNOSIS — R030 Elevated blood-pressure reading, without diagnosis of hypertension: Secondary | ICD-10-CM | POA: Diagnosis not present

## 2017-04-30 DIAGNOSIS — E669 Obesity, unspecified: Secondary | ICD-10-CM

## 2017-04-30 HISTORY — DX: Chronic sinusitis, unspecified: J32.9

## 2017-04-30 MED ORDER — IBUPROFEN 600 MG PO TABS: 600.0000 mg | ORAL_TABLET | Freq: Three times a day (TID) | ORAL | 0 refills | Status: DC | PRN

## 2017-04-30 MED ORDER — BUPROPION HCL ER (XL) 300 MG PO TB24: 300.0000 mg | ORAL_TABLET | Freq: Every day | ORAL | 3 refills | Status: DC

## 2017-04-30 MED ORDER — CEFDINIR 300 MG PO CAPS: 300.0000 mg | ORAL_CAPSULE | Freq: Two times a day (BID) | ORAL | 0 refills | Status: AC

## 2017-04-30 MED ORDER — BUPROPION HCL ER (XL) 150 MG PO TB24: 150.0000 mg | ORAL_TABLET | Freq: Every day | ORAL | 0 refills | Status: DC

## 2017-04-30 NOTE — Progress Notes (Signed)
Subjective:  I acted as a Education administrator for Dr. Charlett Blake. [place], [place]  Patient ID: Peggy Thomas, female    DOB: 12/30/1965, 51 y.o.   MRN: [id number]  No chief complaint on file.   HPI  Patient is in today for an acute visit. Patient c/o her throat being sore due to running over a possible mushrooms and pollen in her yard a few weeks ago. No recent febrile illness or acute hospitalizations. Denies CP/palp/SOB/HA/congestion/fevers/GI or GU c/o. Taking meds as prescribed. No obvious fevers, chills but notes neck pain and enlarged lymph notes as well as PND and a sense of irritation/lump in throat on left side. Has facial pressure/pain in left side of face. Fatigue is also noted   Patient Care Team: Mosie Lukes, MD as PCP - General (Family Medicine)   Past Medical History:  Diagnosis Date  . Allergic state 05/01/2017  . Anemia 03/31/2015  . Aneurysm ([place])    on spleen-watching   . Chronic idiopathic thrombocytopenia (HCC)    MILD--   HEMATOLOGIST--  DR Marin Olp  . Depression 04/30/2011  . History of kidney stones   . History of tobacco abuse 04/30/2011  . Hypocalcemia   . Hypothyroidism 04/30/2011  . Nausea without vomiting 03/26/2015  . Obesity   . PONV (postoperative nausea and vomiting)   . Renal insufficiency   . Right ureteral stone   . Sinusitis 04/30/2017    Past Surgical History:  Procedure Laterality Date  . ABDOMINAL HYSTERECTOMY    . [place]  . CHOLECYSTECTOMY  2000  . CYSTOSCOPY WITH RETROGRADE PYELOGRAM, URETEROSCOPY AND STENT PLACEMENT Right 07/25/2013   Procedure: CYSTOSCOPY WITH RETROGRADE PYELOGRAM, URETEROSCOPY, STONE EXTRACTION AND POSSIBLE STENT PLACEMENT;  Surgeon: Ailene Rud, MD;  Location: [facility];  Service: Urology;  Laterality: Right;  . EMBOLIZATION N/A 12/26/2014   Procedure: EMBOLIZATION;  Surgeon: Serafina Mitchell, MD;  Location[facility] CATH LAB;  Service: Cardiovascular;  Laterality: N/A;  . EXTRACORPOREAL  SHOCK WAVE LITHOTRIPSY  2003  . KNEE ARTHROSCOPY    . PERIPHERAL VASCULAR CATHETERIZATION  12/26/2014   Procedure: VISCERAL ANGIOGRAPHY;  Surgeon: Serafina Mitchell, MD;  Location: [facility] CATH LAB;  Service: Cardiovascular;;  . ROBOTIC ASSISTED TOTAL HYSTERECTOMY  10-15-2010   LAPAROSCOPIC /  LYSIS ADHESIONS  . TUBAL LIGATION  1999  . VIDEO ASSISTED THORACOSCOPY (VATS)/EMPYEMA Left 02/06/2015   Procedure: LEFT VIDEO ASSISTED THORACOSCOPY (VATS)/DRAINAGE OF EMPYEMA;  Surgeon: Grace Isaac, MD;  Location: [place];  Service: Thoracic;  Laterality: Left;  (L)VATS, DRAINAGE OF EMPYEMA  . VIDEO BRONCHOSCOPY N/A 02/06/2015   Procedure: VIDEO BRONCHOSCOPY;  Surgeon: Grace Isaac, MD;  Location: [facility] OR;  Service: Thoracic;  Laterality: N/A;    Family History  Problem Relation Age of Onset  . Diabetes Mother        type 2  . Asthma Mother   . Cancer Mother        skin cancer  . Cancer Father        bone  . Mental illness Sister        schizophrenia  . Alcohol abuse Maternal Grandmother   . Stroke Maternal Grandmother   . Aneurysm Maternal Grandmother        brain aneurysm  . Other Maternal Grandfather        black lung  . Alzheimer's disease Paternal Grandmother   . Stroke Maternal Aunt     Social History   Social History  .  Marital status: Married    Spouse name: N/A  . Number of children: N/A  . Years of education: N/A   Occupational History  . Not on file.   Social History Main Topics  . Smoking status: Current Every Day Smoker    Packs/day: 0.75    Years: 30.00    Types: Cigarettes  . Smokeless tobacco: Never Used  . Alcohol use 0.0 oz/week     Comment: occasionally/socially  . Drug use: No  . Sexual activity: Yes    Partners: Male    Birth control/ protection: Surgical     Comment: TLH/LOA   Other Topics Concern  . Not on file   Social History Narrative  . No narrative on file    Outpatient Medications Prior to Visit  Medication Sig Dispense Refill  .  acetaminophen (TYLENOL) 500 MG tablet Take 1 tablet (500 mg total) by mouth every 6 (six) hours as needed for moderate pain. 30 tablet 0  . b complex vitamins tablet Take 1 tablet by mouth as needed (for energy).     Marland Kitchen levothyroxine (SYNTHROID, LEVOTHROID) 75 MCG tablet TAKE 1 TABLET (75 MCG TOTAL) BY MOUTH DAILY BEFORE BREAKFAST. 30 tablet 6  . traMADol (ULTRAM) 50 MG tablet TAKE 1-2 TALETS BY MOUTH EVERY 6 HOURS AS NEEDED FOR MODERATE PAIN 60 tablet 0  . ibuprofen (ADVIL,MOTRIN) 600 MG tablet TAKE 1 TABLET BY MOUTH EVERY 8 HOURS AS NEEDED 40 tablet 0  . levothyroxine (SYNTHROID, LEVOTHROID) 75 MCG tablet TAKE 1 TABLET (75 MCG TOTAL) BY MOUTH DAILY BEFORE BREAKFAST. 30 tablet 6   No facility-administered medications prior to visit.     No Known Allergies  Review of Systems  Constitutional: Positive for malaise/fatigue. Negative for fever.  HENT: Positive for congestion and sinus pain.   Eyes: Negative for blurred vision.  Respiratory: Positive for sputum production. Negative for cough and shortness of breath.   Cardiovascular: Negative for chest pain, palpitations and leg swelling.  Gastrointestinal: Negative for vomiting.  Musculoskeletal: Positive for neck pain. Negative for back pain.  Skin: Negative for rash.  Neurological: Positive for headaches. Negative for loss of consciousness.       Objective:    Physical Exam  Constitutional: She is oriented to person, place, and time. She appears well-developed and well-nourished. No distress.  HENT:  Head: Normocephalic and atraumatic.  Nasal mucosa boggy and erythematous  Eyes: Conjunctivae are normal.  Neck: Normal range of motion. No thyromegaly present.  Cardiovascular: Normal rate and regular rhythm.   Pulmonary/Chest: Effort normal and breath sounds normal. She has no wheezes.  Abdominal: Soft. Bowel sounds are normal. There is no tenderness.  Musculoskeletal: Normal range of motion. She exhibits no edema or deformity.    Lymphadenopathy:    She has cervical adenopathy.  Neurological: She is alert and oriented to person, place, and time.  Skin: Skin is warm and dry. She is not diaphoretic.  Psychiatric: She has a normal mood and affect.    LMP 08/10/2010  Wt Readings from Last 3 Encounters:  12/31/15 206 lb 3.2 oz (93.5 kg)  07/31/15 202 lb 8 oz (91.9 kg)  07/13/15 192 lb (87.1 kg)   BP Readings from Last 3 Encounters:  12/31/15 (!) 138/96  07/31/15 114/72  07/13/15 (!) 148/97     Immunization History  Administered Date(s) Administered  . Meningococcal Conjugate 12/18/2014  . Pneumococcal Polysaccharide-23 12/18/2014  . Tdap 09/16/2011    Health Maintenance  Topic Date Due  . PAP SMEAR  08/05/2013  . MAMMOGRAM  07/26/2016  . COLONOSCOPY  07/26/2016  . INFLUENZA VACCINE  06/10/2017  . TETANUS/TDAP  09/15/2021  . HIV Screening  Completed    Lab Results  Component Value Date   WBC 8.7 12/31/2015   HGB 16.3 (H) 12/31/2015   HCT 48.3 (H) 12/31/2015   PLT 137.0 (L) 12/31/2015   GLUCOSE 88 12/31/2015   CHOL 134 03/31/2014   TRIG 85 03/31/2014   HDL 39 (L) 03/31/2014   LDLCALC 78 03/31/2014   ALT 37 (H) 12/31/2015   AST 26 12/31/2015   NA 142 12/31/2015   K 4.7 12/31/2015   CL 109 12/31/2015   CREATININE 0.93 12/31/2015   BUN 13 12/31/2015   CO2 29 12/31/2015   TSH 3.840 08/23/2014   INR 1.23 03/03/2015   HGBA1C 5.5 06/18/2015    Lab Results  Component Value Date   TSH 3.840 08/23/2014   Lab Results  Component Value Date   WBC 8.7 12/31/2015   HGB 16.3 (H) 12/31/2015   HCT 48.3 (H) 12/31/2015   MCV 96.0 12/31/2015   PLT 137.0 (L) 12/31/2015   Lab Results  Component Value Date   NA 142 12/31/2015   K 4.7 12/31/2015   CO2 29 12/31/2015   GLUCOSE 88 12/31/2015   BUN 13 12/31/2015   CREATININE 0.93 12/31/2015   BILITOT 0.3 12/31/2015   ALKPHOS 57 12/31/2015   AST 26 12/31/2015   ALT 37 (H) 12/31/2015   PROT 7.4 12/31/2015   ALBUMIN 4.5 12/31/2015   CALCIUM 9.8  12/31/2015   ANIONGAP 8 03/06/2015   GFR 67.98 12/31/2015   Lab Results  Component Value Date   CHOL 134 03/31/2014   Lab Results  Component Value Date   HDL 39 (L) 03/31/2014   Lab Results  Component Value Date   LDLCALC 78 03/31/2014   Lab Results  Component Value Date   TRIG 85 03/31/2014   Lab Results  Component Value Date   CHOLHDL 3.4 03/31/2014   Lab Results  Component Value Date   HGBA1C 5.5 06/18/2015         Assessment & Plan:   Problem List Items Addressed This Visit    Obesity    Encouraged DASH diet, decrease po intake and increase exercise as tolerated. Needs 7-8 hours of sleep nightly. Avoid trans fats, eat small, frequent meals every 4-5 hours with lean proteins, complex carbs and healthy fats. Minimize simple carbs, bariatric referral      Renal insufficiency   Relevant Orders   Comprehensive metabolic panel   Lipid panel   Elevated blood pressure reading   Relevant Orders   Lipid panel   Hypothyroidism   Relevant Orders   Lipid panel   TSH   T4, free   Anemia - Primary   Relevant Orders   CBC w/Diff   Sinusitis    Encouraged increased rest and hydration, add probiotics, zinc such as Coldeze or Xicam. Treat fevers as needed. Started on Cefdinir and Mucinex with probiotics      Relevant Medications   cefdinir (OMNICEF) 300 MG capsule   Allergic state    Flared since May when she had a reaction to hi pollen levels and has not calmed down since. Is encouraged to take Zyrtec, Flonase and Mucinex         I have changed Ms. Routzahn's ibuprofen. I am also having her start on buPROPion, buPROPion, and cefdinir. Additionally, I am having her maintain her b complex vitamins, acetaminophen,  traMADol, and levothyroxine.  Meds ordered this encounter  Medications  . ibuprofen (ADVIL,MOTRIN) 600 MG tablet    Sig: Take 1 tablet (600 mg total) by mouth every 8 (eight) hours as needed.    Dispense:  40 tablet    Refill:  0  . buPROPion  (WELLBUTRIN XL) 150 MG 24 hr tablet    Sig: Take 1 tablet (150 mg total) by mouth daily.    Dispense:  30 tablet    Refill:  0  . buPROPion (WELLBUTRIN XL) 300 MG 24 hr tablet    Sig: Take 1 tablet (300 mg total) by mouth daily.    Dispense:  30 tablet    Refill:  3  . cefdinir (OMNICEF) 300 MG capsule    Sig: Take 1 capsule (300 mg total) by mouth 2 (two) times daily.    Dispense:  20 capsule    Refill:  0    CMA served as scribe during this visit. History, Physical and Plan performed by medical provider. Documentation and orders reviewed and attested to.   CMA served as Education administrator during this visit. History, Physical and Plan performed by medical provider. Documentation and orders reviewed and attested to.  Penni Homans, MD

## 2017-04-30 NOTE — Patient Instructions (Signed)
[place] Mucinex twice daily x 10 days then also start Zyrtec/Cetirizine 10 mg daily for next month. Flonase for next month and probiotics, [employer]    Sinusitis, Adult Sinusitis is soreness and inflammation of your sinuses. Sinuses are hollow spaces in the bones around your face. Your sinuses are located:  Around your eyes.  In the middle of your forehead.  Behind your nose.  In your cheekbones.  Your sinuses and nasal passages are lined with a stringy fluid (mucus). Mucus normally drains out of your sinuses. When your nasal tissues become inflamed or swollen, the mucus can become trapped or blocked so air cannot flow through your sinuses. This allows bacteria, viruses, and funguses to grow, which leads to infection. Sinusitis can develop quickly and last for 7?10 days (acute) or for more than 12 weeks (chronic). Sinusitis often develops after a cold. What are the causes? This condition is caused by anything that creates swelling in the sinuses or stops mucus from draining, including:  Allergies.  Asthma.  Bacterial or viral infection.  Abnormally shaped bones between the nasal passages.  Nasal growths that contain mucus (nasal polyps).  Narrow sinus openings.  Pollutants, such as chemicals or irritants in the air.  A foreign object stuck in the nose.  A fungal infection. This is rare.  What increases the risk? The following factors may make you more likely to develop this condition:  Having allergies or asthma.  Having had a recent cold or respiratory tract infection.  Having structural deformities or blockages in your nose or sinuses.  Having a weak immune system.  Doing a lot of swimming or diving.  Overusing nasal sprays.  Smoking.  What are the signs or symptoms? The main symptoms of this condition are pain and a feeling of pressure around the affected sinuses. Other symptoms include:  Upper toothache.  Earache.  Headache.  Bad breath.  Decreased  sense of smell and taste.  A cough that may get worse at night.  Fatigue.  Fever.  Thick drainage from your nose. The drainage is often green and it may contain pus (purulent).  Stuffy nose or congestion.  Postnasal drip. This is when extra mucus collects in the throat or back of the nose.  Swelling and warmth over the affected sinuses.  Sore throat.  Sensitivity to light.  How is this diagnosed? This condition is diagnosed based on symptoms, a medical history, and a physical exam. To find out if your condition is acute or chronic, your health care provider may:  Look in your nose for signs of nasal polyps.  Tap over the affected sinus to check for signs of infection.  View the inside of your sinuses using an imaging device that has a light attached (endoscope).  If your health care provider suspects that you have chronic sinusitis, you may also:  Be tested for allergies.  Have a sample of mucus taken from your nose (nasal culture) and checked for bacteria.  Have a mucus sample examined to see if your sinusitis is related to an allergy.  If your sinusitis does not respond to treatment and it lasts longer than 8 weeks, you may have an MRI or CT scan to check your sinuses. These scans also help to determine how severe your infection is. In rare cases, a bone biopsy may be done to rule out more serious types of fungal sinus disease. How is this treated? Treatment for sinusitis depends on the cause and whether your condition is chronic or  acute. If a virus is causing your sinusitis, your symptoms will go away on their own within 10 days. You may be given medicines to relieve your symptoms, including:  Topical nasal decongestants. They shrink swollen nasal passages and let mucus drain from your sinuses.  Antihistamines. These drugs block inflammation that is triggered by allergies. This can help to ease swelling in your nose and sinuses.  Topical nasal corticosteroids. These  are nasal sprays that ease inflammation and swelling in your nose and sinuses.  Nasal saline washes. These rinses can help to get rid of thick mucus in your nose.  If your condition is caused by bacteria, you will be given an antibiotic medicine. If your condition is caused by a fungus, you will be given an antifungal medicine. Surgery may be needed to correct underlying conditions, such as narrow nasal passages. Surgery may also be needed to remove polyps. Follow these instructions at home: Medicines  Take, use, or apply over-the-counter and prescription medicines only as told by your health care provider. These may include nasal sprays.  If you were prescribed an antibiotic medicine, take it as told by your health care provider. Do not stop taking the antibiotic even if you start to feel better. Hydrate and Humidify  Drink enough water to keep your urine clear or pale yellow. Staying hydrated will help to thin your mucus.  Use a cool mist humidifier to keep the humidity level in your home above 50%.  Inhale steam for 10-15 minutes, 3-4 times a day or as told by your health care provider. You can do this in the bathroom while a hot shower is running.  Limit your exposure to cool or dry air. Rest  Rest as much as possible.  Sleep with your head raised (elevated).  Make sure to get enough sleep each night. General instructions  Apply a warm, moist washcloth to your face 3-4 times a day or as told by your health care provider. This will help with discomfort.  Wash your hands often with soap and water to reduce your exposure to viruses and other germs. If soap and water are not available, use hand sanitizer.  Do not smoke. Avoid being around people who are smoking (secondhand smoke).  Keep all follow-up visits as told by your health care provider. This is important. Contact a health care provider if:  You have a fever.  Your symptoms get worse.  Your symptoms do not improve  within 10 days. Get help right away if:  You have a severe headache.  You have persistent vomiting.  You have pain or swelling around your face or eyes.  You have vision problems.  You develop confusion.  Your neck is stiff.  You have trouble breathing. This information is not intended to replace advice given to you by your health care provider. Make sure you discuss any questions you have with your health care provider. Document Released: 10/27/2005 Document Revised: 06/22/2016 Document Reviewed: 08/22/2015 Elsevier Interactive Patient Education  2017 [employer].

## 2017-05-01 ENCOUNTER — Encounter: Payer: Self-pay | Admitting: Family Medicine

## 2017-05-01 DIAGNOSIS — T7840XA Allergy, unspecified, initial encounter: Secondary | ICD-10-CM

## 2017-05-01 HISTORY — DX: Allergy, unspecified, initial encounter: T78.40XA

## 2017-05-01 LAB — COMPREHENSIVE METABOLIC PANEL
ALBUMIN: 4.1 g/dL (ref 3.5–5.2)
ALK PHOS: 56 U/L (ref 39–117)
ALT: 31 U/L (ref 0–35)
AST: 22 U/L (ref 0–37)
BILIRUBIN TOTAL: 0.3 mg/dL (ref 0.2–1.2)
BUN: 13 mg/dL (ref 6–23)
CO2: 29 mEq/L (ref 19–32)
Calcium: 9.7 mg/dL (ref 8.4–10.5)
Chloride: 105 mEq/L (ref 96–112)
Creatinine, Ser: 0.86 mg/dL (ref 0.40–1.20)
GFR: 74.01 mL/min (ref >60.00)
GLUCOSE: 94 mg/dL (ref 70–99)
POTASSIUM: 4.2 meq/L (ref 3.5–5.1)
SODIUM: 141 meq/L (ref 135–145)
TOTAL PROTEIN: 6.7 g/dL (ref 6.0–8.3)

## 2017-05-01 LAB — CBC WITH DIFFERENTIAL/PLATELET
BASOS PCT: 1.1 % (ref 0.0–3.0)
Basophils Absolute: 0.1 10*3/uL (ref 0.0–0.1)
EOS PCT: 2.9 % (ref 0.0–5.0)
Eosinophils Absolute: 0.2 10*3/uL (ref 0.0–0.7)
HCT: 47.9 % — ABNORMAL HIGH (ref 36.0–46.0)
HEMOGLOBIN: 16.3 g/dL — AB (ref 12.0–15.0)
Lymphocytes Relative: 37.5 % (ref 12.0–46.0)
Lymphs Abs: 3 10*3/uL (ref 0.7–4.0)
MCHC: 34.1 g/dL (ref 30.0–36.0)
MCV: 95.9 fl (ref 78.0–100.0)
MONO ABS: 0.5 10*3/uL (ref 0.1–1.0)
Monocytes Relative: 6.6 % (ref 3.0–12.0)
NEUTROS ABS: 4.2 10*3/uL (ref 1.4–7.7)
Neutrophils Relative %: 51.9 % (ref 43.0–77.0)
PLATELETS: 133 10*3/uL — AB (ref 150.0–400.0)
RBC: 4.99 Mil/uL (ref 3.87–5.11)
RDW: 13.3 % (ref 11.5–15.5)
WBC: 8.1 10*3/uL (ref 4.0–10.5)

## 2017-05-01 LAB — TSH: TSH: 4.11 u[IU]/mL (ref 0.35–4.50)

## 2017-05-01 LAB — LIPID PANEL
CHOLESTEROL: 177 mg/dL (ref 0–200)
HDL: 35.3 mg/dL — ABNORMAL LOW (ref >39.00)
NonHDL: 141.23
Total CHOL/HDL Ratio: 5
Triglycerides: 227 mg/dL — ABNORMAL HIGH (ref 0.0–149.0)
VLDL: 45.4 mg/dL — AB (ref 0.0–40.0)

## 2017-05-01 LAB — LDL CHOLESTEROL, DIRECT: LDL DIRECT: 111 mg/dL

## 2017-05-01 LAB — T4, FREE: FREE T4: 0.87 ng/dL (ref 0.60–1.60)

## 2017-05-01 NOTE — Assessment & Plan Note (Signed)
Encouraged increased rest and hydration, add probiotics, zinc such as Coldeze or Xicam. Treat fevers as needed. Started on Cefdinir and Mucinex with probiotics

## 2017-05-01 NOTE — Assessment & Plan Note (Signed)
Flared since May when she had a reaction to hi pollen levels and has not calmed down since. Is encouraged to take Zyrtec, Flonase and Mucinex

## 2017-05-01 NOTE — Assessment & Plan Note (Signed)
Encouraged DASH diet, decrease po intake and increase exercise as tolerated. Needs 7-8 hours of sleep nightly. Avoid trans fats, eat small, frequent meals every 4-5 hours with lean proteins, complex carbs and healthy fats. Minimize simple carbs, bariatric referral 

## 2017-05-04 ENCOUNTER — Telehealth: Payer: Self-pay | Admitting: Family Medicine

## 2017-05-04 ENCOUNTER — Emergency Department (HOSPITAL_BASED_OUTPATIENT_CLINIC_OR_DEPARTMENT_OTHER)
Admission: EM | Admit: 2017-05-04 | Discharge: 2017-05-04 | Disposition: A | Discharge status: Home / Self Care | Payer: Managed Care, Other (non HMO) | Attending: Emergency Medicine | Admitting: Emergency Medicine

## 2017-05-04 ENCOUNTER — Other Ambulatory Visit (HOSPITAL_COMMUNITY): Payer: Managed Care, Other (non HMO)

## 2017-05-04 ENCOUNTER — Emergency Department (HOSPITAL_BASED_OUTPATIENT_CLINIC_OR_DEPARTMENT_OTHER): Payer: Managed Care, Other (non HMO)

## 2017-05-04 ENCOUNTER — Encounter (HOSPITAL_BASED_OUTPATIENT_CLINIC_OR_DEPARTMENT_OTHER): Payer: Self-pay | Admitting: *Deleted

## 2017-05-04 ENCOUNTER — Emergency Department (HOSPITAL_COMMUNITY): Payer: Managed Care, Other (non HMO)

## 2017-05-04 DIAGNOSIS — R200 Anesthesia of skin: Secondary | ICD-10-CM | POA: Diagnosis not present

## 2017-05-04 DIAGNOSIS — F17210 Nicotine dependence, cigarettes, uncomplicated: Secondary | ICD-10-CM | POA: Diagnosis not present

## 2017-05-04 DIAGNOSIS — Z79899 Other long term (current) drug therapy: Secondary | ICD-10-CM | POA: Diagnosis not present

## 2017-05-04 DIAGNOSIS — E039 Hypothyroidism, unspecified: Secondary | ICD-10-CM | POA: Diagnosis not present

## 2017-05-04 LAB — COMPREHENSIVE METABOLIC PANEL
ALT: 38 U/L (ref 14–54)
AST: 32 U/L (ref 15–41)
Albumin: 3.8 g/dL (ref 3.5–5.0)
Alkaline Phosphatase: 63 U/L (ref 38–126)
Anion gap: 8 (ref 5–15)
BUN: 15 mg/dL (ref 6–20)
CHLORIDE: 107 mmol/L (ref 101–111)
CO2: 24 mmol/L (ref 22–32)
Calcium: 9 mg/dL (ref 8.9–10.3)
Creatinine, Ser: 0.98 mg/dL (ref 0.44–1.00)
Glucose, Bld: 127 mg/dL — ABNORMAL HIGH (ref 65–99)
POTASSIUM: 4.3 mmol/L (ref 3.5–5.1)
SODIUM: 139 mmol/L (ref 135–145)
Total Bilirubin: 0.6 mg/dL (ref 0.3–1.2)
Total Protein: 7.1 g/dL (ref 6.5–8.1)

## 2017-05-04 LAB — PROTIME-INR
INR: 1.01
PROTHROMBIN TIME: 13.3 s (ref 11.4–15.2)

## 2017-05-04 LAB — DIFFERENTIAL
BASOS ABS: 0 10*3/uL (ref 0.0–0.1)
BASOS PCT: 0 %
EOS ABS: 0.2 10*3/uL (ref 0.0–0.7)
Eosinophils Relative: 2 %
Lymphocytes Relative: 26 %
Lymphs Abs: 1.7 10*3/uL (ref 0.7–4.0)
MONOS PCT: 6 %
Monocytes Absolute: 0.4 10*3/uL (ref 0.1–1.0)
NEUTROS ABS: 4.4 10*3/uL (ref 1.7–7.7)
NEUTROS PCT: 66 %

## 2017-05-04 LAB — CBC
HEMATOCRIT: 43.6 % (ref 36.0–46.0)
Hemoglobin: 15.3 g/dL — ABNORMAL HIGH (ref 12.0–15.0)
MCH: 32.6 pg (ref 26.0–34.0)
MCHC: 35.1 g/dL (ref 30.0–36.0)
MCV: 93 fL (ref 78.0–100.0)
PLATELETS: 122 10*3/uL — AB (ref 150–400)
RBC: 4.69 MIL/uL (ref 3.87–5.11)
RDW: 12.4 % (ref 11.5–15.5)
WBC: 6.6 10*3/uL (ref 4.0–10.5)

## 2017-05-04 LAB — URINALYSIS, ROUTINE W REFLEX MICROSCOPIC
BILIRUBIN URINE: NEGATIVE
Glucose, UA: NEGATIVE mg/dL
HGB URINE DIPSTICK: NEGATIVE
Ketones, ur: NEGATIVE mg/dL
Leukocytes, UA: NEGATIVE
Nitrite: NEGATIVE
Protein, ur: NEGATIVE mg/dL
Specific Gravity, Urine: 1.014 (ref 1.005–1.030)
pH: 5 (ref 5.0–8.0)

## 2017-05-04 LAB — RAPID URINE DRUG SCREEN, HOSP PERFORMED
Amphetamines: NOT DETECTED
BENZODIAZEPINES: NOT DETECTED
Barbiturates: NOT DETECTED
COCAINE: NOT DETECTED
Opiates: NOT DETECTED
Tetrahydrocannabinol: NOT DETECTED

## 2017-05-04 LAB — APTT: APTT: 27 s (ref 24–36)

## 2017-05-04 LAB — I-STAT TROPONIN, ED: TROPONIN I, POC: 0 ng/mL (ref 0.00–0.08)

## 2017-05-04 LAB — ETHANOL

## 2017-05-04 LAB — CBG MONITORING, ED: Glucose-Capillary: 126 mg/dL — ABNORMAL HIGH (ref 65–99)

## 2017-05-04 NOTE — ED Notes (Signed)
ED Provider at bedside. 

## 2017-05-04 NOTE — ED Provider Notes (Signed)
[facility] DEPT [facility] Provider Note   CSN: [id number] Arrival date & time: 05/04/17  0138     History   Chief Complaint Chief Complaint  Patient presents with  . numbness with tingling to left side of face    HPI Peggy Thomas is a 51 y.o. female.  HPI  51 year old female presents with acute numbness and tingling to the left side of her body. She states that she laid down to go to sleep at around 12:30 AM and about 5 minutes later woke up with her left eye twitching and noticed facial numbness on the left side as well as left arm heaviness and numbness. She thinks her left leg has been numb as well. She has not had any trouble moving her extremities. Significant other never noticed any facial droop and she had a normal smile and normal speech the time. No headaches. She is currently on antibiotics for a sinus and ear infection and also just started Wellbutrin 2 days ago. Currently the facial numbness has gone except for some mild left perioral numbness. She denies any headache, neck pain, chest pain.  Past Medical History:  Diagnosis Date  . Allergic state 05/01/2017  . Anemia 03/31/2015  . Aneurysm ([place])    on spleen-watching   . Chronic idiopathic thrombocytopenia (HCC)    MILD--   HEMATOLOGIST--  DR Marin Olp  . Depression 04/30/2011  . History of kidney stones   . History of tobacco abuse 04/30/2011  . Hypocalcemia   . Hypothyroidism 04/30/2011  . Nausea without vomiting 03/26/2015  . Obesity   . PONV (postoperative nausea and vomiting)   . Renal insufficiency   . Right ureteral stone   . Sinusitis 04/30/2017    Patient Active Problem List   Diagnosis Date Noted  . Allergic state 05/01/2017  . Sinusitis 04/30/2017  . Right knee pain 07/17/2015  . Hereditary and idiopathic peripheral neuropathy 07/01/2015  . Elevated sed rate 07/01/2015  . Anemia 03/31/2015  . Right shoulder pain 03/30/2015  . Nausea without vomiting 03/26/2015  . 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 S/P lung surgery, follow-up exam   . Empyema ([place]) 02/06/2015  . Pleural empyema ([place]) 02/05/2015  . Dyspnea   . Pleural effusion on left   . Splenic abscess 01/31/2015  . Vaginal pain 12/03/2014  . Toe pain, right 04/03/2014  . Edema 04/03/2014  . Renal insufficiency 04/03/2014  . Elevated blood pressure reading 04/03/2014  . Hypothyroidism 04/03/2014  . 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 Thrombocytopenia ([place]) 05/29/2011  . Depression 04/30/2011  . History of tobacco abuse 04/30/2011  . Uterus, adenomyosis 04/30/2011  . Tendonitis   . Fatigue   . Hypocalcemia   . Obesity     Past Surgical History:  Procedure Laterality Date  . ABDOMINAL HYSTERECTOMY    . [place]  . CHOLECYSTECTOMY  2000  . CYSTOSCOPY WITH RETROGRADE PYELOGRAM, URETEROSCOPY AND STENT PLACEMENT Right 07/25/2013   Procedure: CYSTOSCOPY WITH RETROGRADE PYELOGRAM, URETEROSCOPY, STONE EXTRACTION AND POSSIBLE STENT PLACEMENT;  Surgeon: Ailene Rud, MD;  Location: [facility];  Service: Urology;  Laterality: Right;  . EMBOLIZATION N/A 12/26/2014   Procedure: EMBOLIZATION;  Surgeon: Serafina Mitchell, MD;  Location: [facility] CATH LAB;  Service: Cardiovascular;  Laterality: N/A;  . EXTRACORPOREAL SHOCK WAVE LITHOTRIPSY  2003  . KNEE ARTHROSCOPY    . PERIPHERAL VASCULAR CATHETERIZATION  12/26/2014   Procedure: VISCERAL ANGIOGRAPHY;  Surgeon: Serafina Mitchell, MD;  Location: [facility] CATH LAB;  Service: Cardiovascular;;  . ROBOTIC ASSISTED TOTAL HYSTERECTOMY  10-15-2010   LAPAROSCOPIC /  LYSIS ADHESIONS  . TUBAL LIGATION  1999  . VIDEO ASSISTED THORACOSCOPY (VATS)/EMPYEMA Left 02/06/2015   Procedure: LEFT VIDEO ASSISTED THORACOSCOPY (VATS)/DRAINAGE OF EMPYEMA;  Surgeon: Grace Isaac, MD;  Location: [facility];  Service: Thoracic;  Laterality: Left;  (L)VATS, DRAINAGE OF EMPYEMA  . VIDEO BRONCHOSCOPY N/A 02/06/2015   Procedure: VIDEO  BRONCHOSCOPY;  Surgeon: Grace Isaac, MD;  Location: [facility] OR;  Service: Thoracic;  Laterality: N/A;    OB History    Gravida Para Term Preterm AB Living   2 2       2    SAB TAB Ectopic Multiple Live Births                   Home Medications    Prior to Admission medications   Medication Sig Start Date End Date Taking? Authorizing Provider  acetaminophen (TYLENOL) 500 MG tablet Take 1 tablet (500 mg total) by mouth every 6 (six) hours as needed for moderate pain. 03/07/15   Regalado, Belkys A, MD  b complex vitamins tablet Take 1 tablet by mouth as needed (for energy).     [provider]  buPROPion (WELLBUTRIN XL) 150 MG 24 hr tablet Take 1 tablet (150 mg total) by mouth daily. 04/30/17   Mosie Lukes, MD  buPROPion (WELLBUTRIN XL) 300 MG 24 hr tablet Take 1 tablet (300 mg total) by mouth daily. 04/30/17   Mosie Lukes, MD  cefdinir (OMNICEF) 300 MG capsule Take 1 capsule (300 mg total) by mouth 2 (two) times daily. 04/30/17 05/10/17  Mosie Lukes, MD  ibuprofen (ADVIL,MOTRIN) 600 MG tablet Take 1 tablet (600 mg total) by mouth every 8 (eight) hours as needed. 04/30/17   Mosie Lukes, MD  levothyroxine (SYNTHROID, LEVOTHROID) 75 MCG tablet TAKE 1 TABLET (75 MCG TOTAL) BY MOUTH DAILY BEFORE BREAKFAST. 08/30/16   Mosie Lukes, MD  traMADol (ULTRAM) 50 MG tablet TAKE 1-2 TALETS BY MOUTH EVERY 6 HOURS AS NEEDED FOR MODERATE PAIN 11/15/15   Mosie Lukes, MD    Family History Family History  Problem Relation Age of Onset  . Diabetes Mother        type 2  . Asthma Mother   . Cancer Mother        skin cancer  . Cancer Father        bone  . Mental illness Sister        schizophrenia  . Alcohol abuse Maternal Grandmother   . Stroke Maternal Grandmother   . Aneurysm Maternal Grandmother        brain aneurysm  . Other Maternal Grandfather        black lung  . Alzheimer's disease Paternal Grandmother   . Stroke Maternal Aunt     Social History Social History    Substance Use Topics  . Smoking status: Current Every Day Smoker    Packs/day: 0.75    Years: 30.00    Types: Cigarettes  . Smokeless tobacco: Never Used  . Alcohol use 0.0 oz/week     Comment: occasionally/socially     Allergies   Patient has no known allergies.   Review of Systems Review of Systems  Eyes: Negative for visual disturbance.  Respiratory: Negative for shortness of breath.   Cardiovascular: Negative for chest pain.  Gastrointestinal: Negative for abdominal pain.  Musculoskeletal: Negative for neck pain.  Neurological: Positive for numbness. Negative for dizziness, speech difficulty, weakness and headaches.  All other systems reviewed and are negative.    Physical Exam Updated Vital Signs BP 124/76   Pulse 74   Temp 98.5 F (36.9 C)   Resp 16   Ht 5\' 1"  (1.549 m)   Wt 92.1 kg (203 lb)   LMP 08/10/2010   SpO2 97%   BMI 38.36 kg/m   Physical Exam  Constitutional: She is oriented to person, place, and time. She appears well-developed and well-nourished.  HENT:  Head: Normocephalic and atraumatic.  Right Ear: Tympanic membrane and external ear normal.  Left Ear: Tympanic membrane and external ear normal.  Nose: Nose normal.  Mouth/Throat: Oropharynx is clear and moist.  Eyes: EOM are normal. Pupils are equal, round, and reactive to light. Right eye exhibits no discharge. Left eye exhibits no discharge.  Neck: Neck supple.  Cardiovascular: Normal rate, regular rhythm and normal heart sounds.   Pulmonary/Chest: Effort normal and breath sounds normal.  Abdominal: Soft. There is no tenderness.  Neurological: She is alert and oriented to person, place, and time.  CN 3-12 grossly intact. 5/5 strength in all 4 extremities. Grossly normal sensation. Able to differentiate sharp and dull touch in all 4 extremities. Normal finger to nose.   Skin: Skin is warm and dry.  Nursing note and vitals reviewed.    ED Treatments / Results  Labs (all labs ordered  are listed, but only abnormal results are displayed) Labs Reviewed  CBC - Abnormal; Notable for the following:       Result Value   Hemoglobin 15.3 (*)    Platelets 122 (*)    All other components within normal limits  COMPREHENSIVE METABOLIC PANEL - Abnormal; Notable for the following:    Glucose, Bld 127 (*)    All other components within normal limits  CBG MONITORING, ED - Abnormal; Notable for the following:    Glucose-Capillary 126 (*)    All other components within normal limits  ETHANOL  PROTIME-INR  APTT  DIFFERENTIAL  RAPID URINE DRUG SCREEN, HOSP PERFORMED  URINALYSIS, ROUTINE W REFLEX MICROSCOPIC  I-STAT TROPOININ, ED    EKG  EKG Interpretation  Date/Time:  Monday May 04 2017 02:10:50 EDT Ventricular Rate:  64 PR Interval:    QRS Duration: 104 QT Interval:  400 QTC Calculation: 413 R Axis:   -28 Text Interpretation:  Sinus rhythm Borderline left axis deviation Low voltage, precordial leads Abnormal R-wave progression, late transition Borderline T wave abnormalities Baseline wander in lead(s) II III aVF no significant change compared to 2016 Confirmed by Sherwood Gambler [id number]) on 05/04/2017 2:19:01 AM       Radiology Ct Head Wo Contrast  Result Date: 05/04/2017 CLINICAL DATA:  Twitching left eye with numbness and tingling to the left side of the face EXAM: CT HEAD WITHOUT CONTRAST TECHNIQUE: Contiguous axial images were obtained from the base of the skull through the vertex without intravenous contrast. COMPARISON:  None. FINDINGS: Brain: No acute territorial infarction, hemorrhage, or intracranial mass is seen. The ventricles are nonenlarged. Minimal atrophy. Vascular: No hyperdense vessels.  No unexpected calcifications Skull: Normal. Negative for fracture or focal lesion. Sinuses/Orbits: No acute finding. Other: None IMPRESSION: No CT evidence for acute intracranial abnormality. Electronically Signed   By: Donavan Foil M.D.   On: 05/04/2017 03:26     Procedures Procedures (including critical care time)  Medications Ordered in ED Medications - No data to display   Initial Impression /  Assessment and Plan / ED Course  I have reviewed the triage vital signs and the nursing notes.  Pertinent labs & imaging results that were available during my care of the patient were reviewed by me and considered in my medical decision making (see chart for details).  Clinical Course as of May 05 335  Mon May 04, 2017  0240 Symptoms appear to be improving. They also are quite mild. At this time she has normal sensation and normal neuro exam. Thus will not call code stroke even though her symptoms are acute. Will workup with labs, CT, may need MRI.  [SG]    Clinical Course User Index [SG] Sherwood Gambler, MD    Initial workup is unremarkable. Patient is still having the left arm and leg numbness. Due to this, will transfer her to [facility] for an MRI tonight. We do not have this available at this facility. While she might possibly have a stroke, her NIH is 0. Could also be medication reaction given new recent medicines such as wellbutrin and antibiotics. She will transfer POV with husband to [facility]. D/w EDP, Dr. Dina Rich, who accepts in transfer. Advised to go straight to [facility] ED.  Final Clinical Impressions(s) / ED Diagnoses   Final diagnoses:  Left sided numbness    New Prescriptions New Prescriptions   No medications on file     Sherwood Gambler, MD 05/04/17 [phone]

## 2017-05-04 NOTE — ED Notes (Signed)
Patient transported to MRI 

## 2017-05-04 NOTE — Telephone Encounter (Signed)
Caller name: Relation to IE:PPIR Call back [place]:  Reason for call: pt states she was seen on [place], however pt was seen in the ER lastnight due to numbness, tingling in left side of face and arm, states test were done and stroke was ruled out and pt was given rx wellbutrin and Dr. Charlett Blake had prescribed an antibiotic for her. Pt would like to know if possible the sign effects is coming from the antibiotic because this has happen before when she was on a antibiotic. Please advise.

## 2017-05-04 NOTE — ED Notes (Signed)
MD with pt  

## 2017-05-04 NOTE — ED Notes (Signed)
Pt is going to be transferred to [facility] ED for an MRI by POV

## 2017-05-04 NOTE — ED Notes (Signed)
Patient is a [facility] fro [facility]

## 2017-05-04 NOTE — ED Notes (Signed)
Report given to [place] at the [facility] ED

## 2017-05-04 NOTE — ED Provider Notes (Signed)
4:48 AM  Patient arrived for [facility] for MRI. Reports that she feels that the numbness is somewhat improved. Mostly over the left side. She is nontoxic and nonfocal. MRI ordered.  6:54 AM MRI is negative for acute stroke. Patient updated. She will improvement. Follow-up with PCP recommended.  After history, exam, and medical workup I feel the patient has been appropriately medically screened and is safe for discharge home. Pertinent diagnoses were discussed with the patient. Patient was given return precautions.    Merryl Hacker, MD 05/04/17 [phone]

## 2017-05-04 NOTE — ED Triage Notes (Signed)
Pt started on wellbutrin  Which is new for her on Friday and an antibiotic on Thursday for a sinus infection. C/o a twitching to left eye that started on Sat that has been intermittent. States tonight she felt some numbness with tingling and  burning to the left side of face. States this has improved by still with a little numbness around her lips. States left arm has some tingling as well. Denies cp/sob. No obvious neuro symptoms.

## 2017-05-04 NOTE — ED Notes (Signed)
Pt states she woke up at [facility] with numbness to the face. Left arm felt tingly and heavy. Also reports nosebleed yesterday a.m. And a rash/bumps to the left side of her face. Recently placed on antibiotic and Wellbutrin. Neuro WNL.

## 2017-05-04 NOTE — ED Notes (Signed)
Pt left with husband by pov to [facility] for an MRI. Stable at transfer.

## 2017-05-04 NOTE — ED Notes (Signed)
Patient transported to CT 

## 2017-05-05 NOTE — Telephone Encounter (Signed)
Notify the antibiotics we gave her do not side effects listed I think sound like what she is experiencing. Can try a trial off and see if symptoms worsen

## 2017-05-05 NOTE — Telephone Encounter (Signed)
Please advise    PC 

## 2017-05-07 NOTE — Telephone Encounter (Signed)
LMOM informing Pt of recommendations below. Instructed to call if questions/concerns.

## 2017-05-19 ENCOUNTER — Other Ambulatory Visit: Payer: Self-pay | Admitting: Family Medicine

## 2017-05-19 MED ORDER — BUPROPION HCL ER (XL) 150 MG PO TB24: 150.0000 mg | ORAL_TABLET | Freq: Every day | ORAL | 0 refills | Status: DC

## 2017-06-21 ENCOUNTER — Other Ambulatory Visit: Payer: Self-pay | Admitting: Family Medicine

## 2017-06-26 ENCOUNTER — Other Ambulatory Visit: Payer: Self-pay | Admitting: Family Medicine

## 2017-07-30 ENCOUNTER — Encounter: Payer: Self-pay | Admitting: Family Medicine

## 2017-07-30 ENCOUNTER — Ambulatory Visit (INDEPENDENT_AMBULATORY_CARE_PROVIDER_SITE_OTHER): Payer: Managed Care, Other (non HMO) | Admitting: Family Medicine

## 2017-07-30 DIAGNOSIS — F329 Major depressive disorder, single episode, unspecified: Secondary | ICD-10-CM | POA: Diagnosis not present

## 2017-07-30 DIAGNOSIS — F32A Depression, unspecified: Secondary | ICD-10-CM

## 2017-07-30 DIAGNOSIS — E785 Hyperlipidemia, unspecified: Secondary | ICD-10-CM

## 2017-07-30 DIAGNOSIS — E669 Obesity, unspecified: Secondary | ICD-10-CM

## 2017-07-30 DIAGNOSIS — R739 Hyperglycemia, unspecified: Secondary | ICD-10-CM

## 2017-07-30 DIAGNOSIS — D696 Thrombocytopenia, unspecified: Secondary | ICD-10-CM

## 2017-07-30 DIAGNOSIS — E782 Mixed hyperlipidemia: Secondary | ICD-10-CM | POA: Diagnosis not present

## 2017-07-30 HISTORY — DX: Hyperlipidemia, unspecified: E78.5

## 2017-07-30 HISTORY — DX: Hyperglycemia, unspecified: R73.9

## 2017-07-30 LAB — COMPREHENSIVE METABOLIC PANEL
ALBUMIN: 4.1 g/dL (ref 3.5–5.2)
ALK PHOS: 64 U/L (ref 39–117)
ALT: 34 U/L (ref 0–35)
AST: 25 U/L (ref 0–37)
BUN: 12 mg/dL (ref 6–23)
CO2: 27 mEq/L (ref 19–32)
Calcium: 9.5 mg/dL (ref 8.4–10.5)
Chloride: 105 mEq/L (ref 96–112)
Creatinine, Ser: 0.92 mg/dL (ref 0.40–1.20)
GFR: 68.4 mL/min (ref >60.00)
GLUCOSE: 100 mg/dL — AB (ref 70–99)
Potassium: 3.9 mEq/L (ref 3.5–5.1)
Sodium: 137 mEq/L (ref 135–145)
TOTAL PROTEIN: 7.1 g/dL (ref 6.0–8.3)
Total Bilirubin: 0.5 mg/dL (ref 0.2–1.2)

## 2017-07-30 LAB — LIPID PANEL
CHOLESTEROL: 171 mg/dL (ref 0–200)
HDL: 41.9 mg/dL (ref >39.00)
LDL Cholesterol: 92 mg/dL (ref 0–99)
NonHDL: 129.11
TRIGLYCERIDES: 187 mg/dL — AB (ref 0.0–149.0)
Total CHOL/HDL Ratio: 4
VLDL: 37.4 mg/dL (ref 0.0–40.0)

## 2017-07-30 LAB — CBC
HEMATOCRIT: 46.5 % — AB (ref 36.0–46.0)
HEMOGLOBIN: 15.8 g/dL — AB (ref 12.0–15.0)
MCHC: 33.9 g/dL (ref 30.0–36.0)
MCV: 96.4 fl (ref 78.0–100.0)
Platelets: 136 10*3/uL — ABNORMAL LOW (ref 150.0–400.0)
RBC: 4.82 Mil/uL (ref 3.87–5.11)
RDW: 13 % (ref 11.5–15.5)
WBC: 6.4 10*3/uL (ref 4.0–10.5)

## 2017-07-30 LAB — TSH: TSH: 12.07 u[IU]/mL — ABNORMAL HIGH (ref 0.35–4.50)

## 2017-07-30 LAB — HEMOGLOBIN A1C: Hgb A1c MFr Bld: 5.7 % (ref 4.6–6.5)

## 2017-07-30 MED ORDER — BUPROPION HCL ER (XL) 150 MG PO TB24: 150.0000 mg | ORAL_TABLET | Freq: Every day | ORAL | 5 refills | Status: DC

## 2017-07-30 NOTE — Assessment & Plan Note (Signed)
Encouraged heart healthy diet, increase exercise, avoid trans fats, consider a krill oil cap daily 

## 2017-07-30 NOTE — Assessment & Plan Note (Signed)
minimize simple carbs. Increase exercise as tolerated.  

## 2017-07-30 NOTE — Assessment & Plan Note (Signed)
Encouraged DASH diet, decrease po intake and increase exercise as tolerated. Needs 7-8 hours of sleep nightly. Avoid trans fats, eat small, frequent meals every 4-5 hours with lean proteins, complex carbs and healthy fats. Minimize simple carbs, bariatric referral 

## 2017-07-30 NOTE — Patient Instructions (Signed)
DASH Eating Plan DASH stands for "Dietary Approaches to Stop Hypertension." The DASH eating plan is a healthy eating plan that has been shown to reduce high blood pressure (hypertension). It may also reduce your risk for type 2 diabetes, heart disease, and stroke. The DASH eating plan may also help with weight loss. What are tips for following this plan? General guidelines  Avoid eating more than 2,300 mg (milligrams) of salt (sodium) a day. If you have hypertension, you may need to reduce your sodium intake to 1,500 mg a day.  Limit alcohol intake to no more than 1 drink a day for nonpregnant women and 2 drinks a day for men. One drink equals 12 oz of beer, 5 oz of wine, or 1 oz of hard liquor.  Work with your health care provider to maintain a healthy body weight or to lose weight. Ask what an ideal weight is for you.  Get at least 30 minutes of exercise that causes your heart to beat faster (aerobic exercise) most days of the week. Activities may include walking, swimming, or biking.  Work with your health care provider or diet and nutrition specialist (dietitian) to adjust your eating plan to your individual calorie needs. Reading food labels  Check food labels for the amount of sodium per serving. Choose foods with less than 5 percent of the Daily Value of sodium. Generally, foods with less than 300 mg of sodium per serving fit into this eating plan.  To find whole grains, look for the word "whole" as the first word in the ingredient list. Shopping  Buy products labeled as "low-sodium" or "no salt added."  Buy fresh foods. Avoid canned foods and premade or frozen meals. Cooking  Avoid adding salt when cooking. Use salt-free seasonings or herbs instead of table salt or sea salt. Check with your health care provider or pharmacist before using salt substitutes.  Do not fry foods. Cook foods using healthy methods such as baking, boiling, grilling, and broiling instead.  Cook with  heart-healthy oils, such as olive, canola, soybean, or sunflower oil. Meal planning   Eat a balanced diet that includes: ? 5 or more servings of fruits and vegetables each day. At each meal, try to fill half of your plate with fruits and vegetables. ? Up to 6-8 servings of whole grains each day. ? Less than 6 oz of lean meat, poultry, or fish each day. A 3-oz serving of meat is about the same size as a deck of cards. One egg equals 1 oz. ? 2 servings of low-fat dairy each day. ? A serving of nuts, seeds, or beans 5 times each week. ? Heart-healthy fats. Healthy fats called Omega-3 fatty acids are found in foods such as flaxseeds and coldwater fish, like sardines, salmon, and mackerel.  Limit how much you eat of the following: ? Canned or prepackaged foods. ? Food that is high in trans fat, such as fried foods. ? Food that is high in saturated fat, such as fatty meat. ? Sweets, desserts, sugary drinks, and other foods with added sugar. ? Full-fat dairy products.  Do not salt foods before eating.  Try to eat at least 2 vegetarian meals each week.  Eat more home-cooked food and less restaurant, buffet, and fast food.  When eating at a restaurant, ask that your food be prepared with less salt or no salt, if possible. What foods are recommended? The items listed may not be a complete list. Talk with your dietitian about what   dietary choices are best for you. Grains Whole-grain or whole-wheat bread. Whole-grain or whole-wheat pasta. Brown rice. Oatmeal. Quinoa. Bulgur. Whole-grain and low-sodium cereals. Pita bread. Low-fat, low-sodium crackers. Whole-wheat flour tortillas. Vegetables Fresh or frozen vegetables (raw, steamed, roasted, or grilled). Low-sodium or reduced-sodium tomato and vegetable juice. Low-sodium or reduced-sodium tomato sauce and tomato paste. Low-sodium or reduced-sodium canned vegetables. Fruits All fresh, dried, or frozen fruit. Canned fruit in natural juice (without  added sugar). Meat and other protein foods Skinless chicken or turkey. Ground chicken or turkey. Pork with fat trimmed off. Fish and seafood. Egg whites. Dried beans, peas, or lentils. Unsalted nuts, nut butters, and seeds. Unsalted canned beans. Lean cuts of beef with fat trimmed off. Low-sodium, lean deli meat. Dairy Low-fat (1%) or fat-free (skim) milk. Fat-free, low-fat, or reduced-fat cheeses. Nonfat, low-sodium ricotta or cottage cheese. Low-fat or nonfat yogurt. Low-fat, low-sodium cheese. Fats and oils Soft margarine without trans fats. Vegetable oil. Low-fat, reduced-fat, or light mayonnaise and salad dressings (reduced-sodium). Canola, safflower, olive, soybean, and sunflower oils. Avocado. Seasoning and other foods Herbs. Spices. Seasoning mixes without salt. Unsalted popcorn and pretzels. Fat-free sweets. What foods are not recommended? The items listed may not be a complete list. Talk with your dietitian about what dietary choices are best for you. Grains Baked goods made with fat, such as croissants, muffins, or some breads. Dry pasta or rice meal packs. Vegetables Creamed or fried vegetables. Vegetables in a cheese sauce. Regular canned vegetables (not low-sodium or reduced-sodium). Regular canned tomato sauce and paste (not low-sodium or reduced-sodium). Regular tomato and vegetable juice (not low-sodium or reduced-sodium). Pickles. Olives. Fruits Canned fruit in a light or heavy syrup. Fried fruit. Fruit in cream or butter sauce. Meat and other protein foods Fatty cuts of meat. Ribs. Fried meat. Bacon. Sausage. Bologna and other processed lunch meats. Salami. Fatback. Hotdogs. Bratwurst. Salted nuts and seeds. Canned beans with added salt. Canned or smoked fish. Whole eggs or egg yolks. Chicken or turkey with skin. Dairy Whole or 2% milk, cream, and half-and-half. Whole or full-fat cream cheese. Whole-fat or sweetened yogurt. Full-fat cheese. Nondairy creamers. Whipped toppings.  Processed cheese and cheese spreads. Fats and oils Butter. Stick margarine. Lard. Shortening. Ghee. Bacon fat. Tropical oils, such as coconut, palm kernel, or palm oil. Seasoning and other foods Salted popcorn and pretzels. Onion salt, garlic salt, seasoned salt, table salt, and sea salt. Worcestershire sauce. Tartar sauce. Barbecue sauce. Teriyaki sauce. Soy sauce, including reduced-sodium. Steak sauce. Canned and packaged gravies. Fish sauce. Oyster sauce. Cocktail sauce. Horseradish that you find on the shelf. Ketchup. Mustard. Meat flavorings and tenderizers. Bouillon cubes. Hot sauce and Tabasco sauce. Premade or packaged marinades. Premade or packaged taco seasonings. Relishes. Regular salad dressings. Where to find more information:  National Heart, Lung, and Blood Institute: www.nhlbi.nih.gov  American Heart Association: www.heart.org Summary  The DASH eating plan is a healthy eating plan that has been shown to reduce high blood pressure (hypertension). It may also reduce your risk for type 2 diabetes, heart disease, and stroke.  With the DASH eating plan, you should limit salt (sodium) intake to 2,300 mg a day. If you have hypertension, you may need to reduce your sodium intake to 1,500 mg a day.  When on the DASH eating plan, aim to eat more fresh fruits and vegetables, whole grains, lean proteins, low-fat dairy, and heart-healthy fats.  Work with your health care provider or diet and nutrition specialist (dietitian) to adjust your eating plan to your individual   calorie needs. This information is not intended to replace advice given to you by your health care provider. Make sure you discuss any questions you have with your health care provider. Document Released: 10/16/2011 Document Revised: 10/20/2016 Document Reviewed: 10/20/2016 Elsevier Interactive Patient Education  2017 Elsevier Inc.  

## 2017-07-30 NOTE — Assessment & Plan Note (Signed)
Repeat CBC

## 2017-07-30 NOTE — Assessment & Plan Note (Signed)
Doing well on Wellbutrin XL 150 mg daily chose not to increase to 300 mg daily. Refill given

## 2017-07-30 NOTE — Progress Notes (Signed)
Subjective:  I acted as a Education administrator for Dr. Charlett Blake. [place], [place]  Patient ID: Peggy Thomas, female    DOB: Mar 20, 1966, 51 y.o.   MRN: [id number]  No chief complaint on file.   HPI  Patient is in today for a 3 month follow up. She is doing well, no recent illness or acute hospitalizations. She is doing well but her adult son is struggling with depression. She did not increase her Wellbutrin and she is doing well. Denies CP/palp/SOB/HA/congestion/fevers/GI or GU c/o. Taking meds as prescribed  Patient Care Team: Mosie Lukes, MD as PCP - General (Family Medicine)   Past Medical History:  Diagnosis Date  . Allergic state 05/01/2017  . Anemia 03/31/2015  . Aneurysm ([place])    on spleen-watching   . Chronic idiopathic thrombocytopenia (HCC)    MILD--   HEMATOLOGIST--  DR Marin Olp  . Depression 04/30/2011  . History of kidney stones   . History of tobacco abuse 04/30/2011  . Hyperglycemia 07/30/2017  . Hyperlipidemia 07/30/2017  . Hypocalcemia   . Hypothyroidism 04/30/2011  . Nausea without vomiting 03/26/2015  . Obesity   . PONV (postoperative nausea and vomiting)   . Renal insufficiency   . Right ureteral stone   . Sinusitis 04/30/2017    Past Surgical History:  Procedure Laterality Date  . ABDOMINAL HYSTERECTOMY    . [place]  . CHOLECYSTECTOMY  2000  . CYSTOSCOPY WITH RETROGRADE PYELOGRAM, URETEROSCOPY AND STENT PLACEMENT Right 07/25/2013   Procedure: CYSTOSCOPY WITH RETROGRADE PYELOGRAM, URETEROSCOPY, STONE EXTRACTION AND POSSIBLE STENT PLACEMENT;  Surgeon: Ailene Rud, MD;  Location: [facility];  Service: Urology;  Laterality: Right;  . EMBOLIZATION N/A 12/26/2014   Procedure: EMBOLIZATION;  Surgeon: Serafina Mitchell, MD;  Location: [facility] CATH LAB;  Service: Cardiovascular;  Laterality: N/A;  . EXTRACORPOREAL SHOCK WAVE LITHOTRIPSY  2003  . KNEE ARTHROSCOPY    . PERIPHERAL VASCULAR CATHETERIZATION  12/26/2014   Procedure: VISCERAL  ANGIOGRAPHY;  Surgeon: Serafina Mitchell, MD;  Location: [facility] CATH LAB;  Service: Cardiovascular;;  . ROBOTIC ASSISTED TOTAL HYSTERECTOMY  10-15-2010   LAPAROSCOPIC /  LYSIS ADHESIONS  . TUBAL LIGATION  1999  . VIDEO ASSISTED THORACOSCOPY (VATS)/EMPYEMA Left 02/06/2015   Procedure: LEFT VIDEO ASSISTED THORACOSCOPY (VATS)/DRAINAGE OF EMPYEMA;  Surgeon: Grace Isaac, MD;  Location: [facility];  Service: Thoracic;  Laterality: Left;  (L)VATS, DRAINAGE OF EMPYEMA  . VIDEO BRONCHOSCOPY N/A 02/06/2015   Procedure: VIDEO BRONCHOSCOPY;  Surgeon: Grace Isaac, MD;  Location: [facility] OR;  Service: Thoracic;  Laterality: N/A;    Family History  Problem Relation Age of Onset  . Diabetes Mother        type 2  . Asthma Mother   . Cancer Mother        skin cancer  . Cancer Father        bone  . Mental illness Sister        schizophrenia  . Alcohol abuse Maternal Grandmother   . Stroke Maternal Grandmother   . Aneurysm Maternal Grandmother        brain aneurysm  . Other Maternal Grandfather        black lung  . Alzheimer's disease Paternal Grandmother   . Stroke Maternal Aunt     Social History   Social History  . Marital status: Married    Spouse name: N/A  . Number of children: N/A  . Years of education: N/A   Occupational  History  . Not on file.   Social History Main Topics  . Smoking status: Current Every Day Smoker    Packs/day: 0.75    Years: 30.00    Types: Cigarettes  . Smokeless tobacco: Never Used  . Alcohol use 0.0 oz/week     Comment: occasionally/socially  . Drug use: No  . Sexual activity: Yes    Partners: Male    Birth control/ protection: Surgical     Comment: TLH/LOA   Other Topics Concern  . Not on file   Social History Narrative  . No narrative on file    Outpatient Medications Prior to Visit  Medication Sig Dispense Refill  . acetaminophen (TYLENOL) 500 MG tablet Take 1 tablet (500 mg total) by mouth every 6 (six) hours as needed for moderate pain. 30  tablet 0  . b complex vitamins tablet Take 1 tablet by mouth as needed (for energy).     Marland Kitchen ibuprofen (ADVIL,MOTRIN) 600 MG tablet Take 1 tablet (600 mg total) by mouth every 8 (eight) hours as needed. 40 tablet 0  . levothyroxine (SYNTHROID, LEVOTHROID) 75 MCG tablet TAKE 1 TABLET (75 MCG TOTAL) BY MOUTH DAILY BEFORE BREAKFAST. 30 tablet 6  . traMADol (ULTRAM) 50 MG tablet TAKE 1-2 TALETS BY MOUTH EVERY 6 HOURS AS NEEDED FOR MODERATE PAIN 60 tablet 0  . buPROPion (WELLBUTRIN XL) 150 MG 24 hr tablet TAKE 1 TABLET BY MOUTH EVERY DAY 30 tablet 0  . buPROPion (WELLBUTRIN XL) 150 MG 24 hr tablet TAKE 1 TABLET BY MOUTH EVERY DAY 30 tablet 0  . buPROPion (WELLBUTRIN XL) 300 MG 24 hr tablet Take 1 tablet (300 mg total) by mouth daily. 30 tablet 3   No facility-administered medications prior to visit.     No Known Allergies  Review of Systems  Constitutional: Negative for fever and malaise/fatigue.  HENT: Negative for congestion.   Eyes: Negative for blurred vision.  Respiratory: Negative for cough and shortness of breath.   Cardiovascular: Negative for chest pain, palpitations and leg swelling.  Gastrointestinal: Negative for vomiting.  Musculoskeletal: Negative for back pain.  Skin: Negative for rash.  Neurological: Negative for loss of consciousness and headaches.       Objective:    Physical Exam  Constitutional: She is oriented to person, place, and time. She appears well-developed and well-nourished. No distress.  HENT:  Head: Normocephalic and atraumatic.  Eyes: Conjunctivae are normal.  Neck: Normal range of motion. No thyromegaly present.  Cardiovascular: Normal rate and regular rhythm.   Pulmonary/Chest: Effort normal and breath sounds normal. She has no wheezes.  Abdominal: Soft. Bowel sounds are normal. There is no tenderness.  Musculoskeletal: Normal range of motion. She exhibits no edema or deformity.  Neurological: She is alert and oriented to person, place, and time.    Skin: Skin is warm and dry. She is not diaphoretic.  Psychiatric: She has a normal mood and affect.    BP 110/72 (BP Location: Left Arm, Patient Position: Sitting, Cuff Size: Normal)   Pulse 68   Temp 98.3 F (36.8 C) (Oral)   Resp 18   Wt 205 lb 3.2 oz (93.1 kg)   LMP 08/10/2010   SpO2 98%   BMI 38.77 kg/m  Wt Readings from Last 3 Encounters:  07/30/17 205 lb 3.2 oz (93.1 kg)  05/04/17 203 lb (92.1 kg)  12/31/15 206 lb 3.2 oz (93.5 kg)   BP Readings from Last 3 Encounters:  07/30/17 110/72  05/04/17 114/75  12/31/15 Marland Kitchen)  138/96     Immunization History  Administered Date(s) Administered  . Meningococcal Conjugate 12/18/2014  . Pneumococcal Polysaccharide-23 12/18/2014  . Tdap 09/16/2011    Health Maintenance  Topic Date Due  . PAP SMEAR  08/05/2013  . MAMMOGRAM  07/26/2016  . COLONOSCOPY  07/26/2016  . INFLUENZA VACCINE  06/10/2017  . TETANUS/TDAP  09/15/2021  . HIV Screening  Completed    Lab Results  Component Value Date   WBC 6.6 05/04/2017   HGB 15.3 (H) 05/04/2017   HCT 43.6 05/04/2017   PLT 122 (L) 05/04/2017   GLUCOSE 127 (H) 05/04/2017   CHOL 177 04/30/2017   TRIG 227.0 (H) 04/30/2017   HDL 35.30 (L) 04/30/2017   LDLDIRECT 111.0 04/30/2017   LDLCALC 78 03/31/2014   ALT 38 05/04/2017   AST 32 05/04/2017   NA 139 05/04/2017   K 4.3 05/04/2017   CL 107 05/04/2017   CREATININE 0.98 05/04/2017   BUN 15 05/04/2017   CO2 24 05/04/2017   TSH 4.11 04/30/2017   INR 1.01 05/04/2017   HGBA1C 5.5 06/18/2015    Lab Results  Component Value Date   TSH 4.11 04/30/2017   Lab Results  Component Value Date   WBC 6.6 05/04/2017   HGB 15.3 (H) 05/04/2017   HCT 43.6 05/04/2017   MCV 93.0 05/04/2017   PLT 122 (L) 05/04/2017   Lab Results  Component Value Date   NA 139 05/04/2017   K 4.3 05/04/2017   CO2 24 05/04/2017   GLUCOSE 127 (H) 05/04/2017   BUN 15 05/04/2017   CREATININE 0.98 05/04/2017   BILITOT 0.6 05/04/2017   ALKPHOS 63 05/04/2017    AST 32 05/04/2017   ALT 38 05/04/2017   PROT 7.1 05/04/2017   ALBUMIN 3.8 05/04/2017   CALCIUM 9.0 05/04/2017   ANIONGAP 8 05/04/2017   GFR 74.01 04/30/2017   Lab Results  Component Value Date   CHOL 177 04/30/2017   Lab Results  Component Value Date   HDL 35.30 (L) 04/30/2017   Lab Results  Component Value Date   LDLCALC 78 03/31/2014   Lab Results  Component Value Date   TRIG 227.0 (H) 04/30/2017   Lab Results  Component Value Date   CHOLHDL 5 04/30/2017   Lab Results  Component Value Date   HGBA1C 5.5 06/18/2015         Assessment & Plan:   Problem List Items Addressed This Visit    Obesity    Encouraged DASH diet, decrease po intake and increase exercise as tolerated. Needs 7-8 hours of sleep nightly. Avoid trans fats, eat small, frequent meals every 4-5 hours with lean proteins, complex carbs and healthy fats. Minimize simple carbs, bariatric referral      Depression    Doing well on Wellbutrin XL 150 mg daily chose not to increase to 300 mg daily. Refill given      Relevant Medications   buPROPion (WELLBUTRIN XL) 150 MG 24 hr tablet   Other Relevant Orders   TSH   Thrombocytopenia (HCC)    Repeat CBC      Relevant Orders   CBC   Hyperglycemia      minimize simple carbs. Increase exercise as tolerated.       Relevant Orders   Hemoglobin A1c   Comprehensive metabolic panel   TSH   Hyperlipidemia    Encouraged heart healthy diet, increase exercise, avoid trans fats, consider a krill oil cap daily      Relevant Orders  Lipid panel   TSH      I have discontinued Ms. Fullenwider's buPROPion and buPROPion. I have also changed her buPROPion. Additionally, I am having her maintain her b complex vitamins, acetaminophen, traMADol, levothyroxine, and ibuprofen.  Meds ordered this encounter  Medications  . buPROPion (WELLBUTRIN XL) 150 MG 24 hr tablet    Sig: Take 1 tablet (150 mg total) by mouth daily.    Dispense:  30 tablet    Refill:  5     CMA served as scribe during this visit. History, Physical and Plan performed by medical provider. Documentation and orders reviewed and attested to.  Penni Homans, MD

## 2017-07-31 MED ORDER — LEVOTHYROXINE SODIUM 88 MCG PO TABS: 88.0000 ug | ORAL_TABLET | Freq: Every day | ORAL | 1 refills | Status: DC

## 2017-07-31 NOTE — Addendum Note (Signed)
Addended by: Magdalene Molly A on: 07/31/2017 10:28 AM   Modules accepted: Orders

## 2017-08-04 ENCOUNTER — Telehealth: Payer: Self-pay | Admitting: *Deleted

## 2017-08-04 NOTE — Telephone Encounter (Signed)
Received Cologuard Order Incomplete: [id number]; forwarded to provider/SLS 09/25

## 2017-08-06 ENCOUNTER — Telehealth: Payer: Self-pay | Admitting: *Deleted

## 2017-08-06 NOTE — Telephone Encounter (Signed)
Received Cologuard Order Incomplete: [id number]; order has missing information; forwarded to provider/SLS 09/27

## 2017-08-12 ENCOUNTER — Telehealth: Payer: Self-pay | Admitting: *Deleted

## 2017-08-12 NOTE — Telephone Encounter (Signed)
Received Cologuard Order Incomplete: [id number]; order has been sent back because it has missing information, correct and fax back to [phone]; forwarded to provider/SLS 10/03

## 2017-08-28 ENCOUNTER — Encounter (HOSPITAL_COMMUNITY): Payer: Self-pay | Admitting: Emergency Medicine

## 2017-08-28 ENCOUNTER — Telehealth: Payer: Self-pay | Admitting: Family Medicine

## 2017-08-28 ENCOUNTER — Ambulatory Visit: Payer: Managed Care, Other (non HMO) | Admitting: Family

## 2017-08-28 ENCOUNTER — Other Ambulatory Visit: Payer: Self-pay

## 2017-08-28 ENCOUNTER — Emergency Department (HOSPITAL_COMMUNITY): Payer: Managed Care, Other (non HMO)

## 2017-08-28 ENCOUNTER — Emergency Department (HOSPITAL_COMMUNITY)
Admission: EM | Admit: 2017-08-28 | Discharge: 2017-08-29 | Disposition: A | Discharge status: Home / Self Care | Payer: Managed Care, Other (non HMO) | Attending: Emergency Medicine | Admitting: Emergency Medicine

## 2017-08-28 DIAGNOSIS — N189 Chronic kidney disease, unspecified: Secondary | ICD-10-CM | POA: Insufficient documentation

## 2017-08-28 DIAGNOSIS — Z0289 Encounter for other administrative examinations: Secondary | ICD-10-CM

## 2017-08-28 DIAGNOSIS — E039 Hypothyroidism, unspecified: Secondary | ICD-10-CM | POA: Diagnosis not present

## 2017-08-28 DIAGNOSIS — Z79899 Other long term (current) drug therapy: Secondary | ICD-10-CM | POA: Insufficient documentation

## 2017-08-28 DIAGNOSIS — R079 Chest pain, unspecified: Secondary | ICD-10-CM

## 2017-08-28 DIAGNOSIS — F17210 Nicotine dependence, cigarettes, uncomplicated: Secondary | ICD-10-CM | POA: Insufficient documentation

## 2017-08-28 LAB — CBC
HCT: 48.6 % — ABNORMAL HIGH (ref 36.0–46.0)
Hemoglobin: 16.8 g/dL — ABNORMAL HIGH (ref 12.0–15.0)
MCH: 32.1 pg (ref 26.0–34.0)
MCHC: 34.6 g/dL (ref 30.0–36.0)
MCV: 92.9 fL (ref 78.0–100.0)
PLATELETS: 133 10*3/uL — AB (ref 150–400)
RBC: 5.23 MIL/uL — AB (ref 3.87–5.11)
RDW: 12.8 % (ref 11.5–15.5)
WBC: 7.2 10*3/uL (ref 4.0–10.5)

## 2017-08-28 LAB — BASIC METABOLIC PANEL
Anion gap: 9 (ref 5–15)
BUN: 11 mg/dL (ref 6–20)
CHLORIDE: 104 mmol/L (ref 101–111)
CO2: 24 mmol/L (ref 22–32)
CREATININE: 0.91 mg/dL (ref 0.44–1.00)
Calcium: 9.7 mg/dL (ref 8.9–10.3)
GFR calc non Af Amer: >60 mL/min (ref >60)
GLUCOSE: 109 mg/dL — AB (ref 65–99)
Potassium: 4.4 mmol/L (ref 3.5–5.1)
Sodium: 137 mmol/L (ref 135–145)

## 2017-08-28 LAB — I-STAT TROPONIN, ED: Troponin i, poc: 0.26 ng/mL (ref 0.00–0.08)

## 2017-08-28 LAB — TROPONIN I
Troponin I: <0.03 ng/mL (ref <0.03)
Troponin I: <0.03 ng/mL (ref <0.03)

## 2017-08-28 MED ORDER — OMEPRAZOLE 20 MG PO CPDR: 20.0000 mg | DELAYED_RELEASE_CAPSULE | Freq: Every day | ORAL | 0 refills | Status: DC

## 2017-08-28 MED ORDER — NITROGLYCERIN 0.4 MG SL SUBL
0.4000 mg | SUBLINGUAL_TABLET | SUBLINGUAL | Status: DC | PRN
  Administered 2017-08-28 (×2): 0.4 mg via SUBLINGUAL
  Filled 2017-08-28: qty 1

## 2017-08-28 MED ORDER — ASPIRIN 81 MG PO CHEW
324.0000 mg | CHEWABLE_TABLET | Freq: Once | ORAL | Status: AC
Start: 2017-08-28 — End: 2017-08-28
  Administered 2017-08-28: 324 mg via ORAL
  Filled 2017-08-28: qty 4

## 2017-08-28 NOTE — ED Triage Notes (Signed)
Pt to ED for evaluation of cp of increasing duration that started two weeks ago. Pain to left side that radiates to in central back between shoulder blades. Describes as burning and heaviness. Burning is intermittent but heaviness is constant. Denies N/V and diaphoresis but endorses shortness of breath. Sts that she took two aspirin last night that somewhat relieved pain but has not taken any today.

## 2017-08-28 NOTE — ED Provider Notes (Signed)
[place] EMERGENCY DEPARTMENT Provider Note   CSN: [id number] Arrival date & time: 08/28/17  1229     History   Chief Complaint Chief Complaint  Patient presents with  . Chest Pain    HPI Peggy Thomas is a 51 y.o. female.  51 year old female history of tobacco use, obesity, HTN, HLD who presents with 2 weeks of intermittent chest pain.  Patient describes intermittent burning chest pain rating from substernal region to left chest.  Occasional radiation to left shoulder.  Today was occasionally associated with movement.  Otherwise random in nature, not associated with food.  Denies any personal history of MI or cardiac stents.  No family history of cardiac disease before age 60.  She currently smokes approximately 1 pack/day.  She is concerned about a cardiac process. Denies fevers or cough. 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 No recent trauma.  The history is provided by the patient, medical records and the spouse. No language interpreter was used.    Past Medical History:  Diagnosis Date  . Allergic state 05/01/2017  . Anemia 03/31/2015  . Aneurysm ([place])    on spleen-watching   . Chronic idiopathic thrombocytopenia (HCC)    MILD--   HEMATOLOGIST--  DR Marin Olp  . Depression 04/30/2011  . History of kidney stones   . History of tobacco abuse 04/30/2011  . Hyperglycemia 07/30/2017  . Hyperlipidemia 07/30/2017  . Hypocalcemia   . Hypothyroidism 04/30/2011  . Nausea without vomiting 03/26/2015  . Obesity   . PONV (postoperative nausea and vomiting)   . Renal insufficiency   . Right ureteral stone   . Sinusitis 04/30/2017    Patient Active Problem List   Diagnosis Date Noted  . Hyperglycemia 07/30/2017  . Hyperlipidemia 07/30/2017  . Allergic state 05/01/2017  . Sinusitis 04/30/2017  . Right knee pain 07/17/2015  . Hereditary and idiopathic peripheral neuropathy 07/01/2015  . Elevated sed rate  07/01/2015  . Anemia 03/31/2015  . Right shoulder pain 03/30/2015  . Nausea without vomiting 03/26/2015  . 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 S/P lung surgery, follow-up exam   . Empyema ([place]) 02/06/2015  . Pleural empyema ([place]) 02/05/2015  . Dyspnea   . Pleural effusion on left   . Splenic abscess 01/31/2015  . Vaginal pain 12/03/2014  . Toe pain, right 04/03/2014  . Edema 04/03/2014  . Renal insufficiency 04/03/2014  . Elevated blood pressure reading 04/03/2014  . Hypothyroidism 04/03/2014  . Splenic artery aneurysm ([place]) 09/20/2013  . Thrombocytopenia ([place]) 05/29/2011  . Depression 04/30/2011  . History of tobacco abuse 04/30/2011  . Uterus, adenomyosis 04/30/2011  . Fatigue   . Hypocalcemia   . Obesity     Past Surgical History:  Procedure Laterality Date  . ABDOMINAL HYSTERECTOMY    . [place]  . CHOLECYSTECTOMY  2000  . CYSTOSCOPY WITH RETROGRADE PYELOGRAM, URETEROSCOPY AND STENT PLACEMENT Right 07/25/2013   Procedure: CYSTOSCOPY WITH RETROGRADE PYELOGRAM, URETEROSCOPY, STONE EXTRACTION AND POSSIBLE STENT PLACEMENT;  Surgeon: Ailene Rud, MD;  Location: [facility];  Service: Urology;  Laterality: Right;  . EMBOLIZATION N/A 12/26/2014   Procedure: EMBOLIZATION;  Surgeon: Serafina Mitchell, MD;  Location: [facility] CATH LAB;  Service: Cardiovascular;  Laterality: N/A;  . EXTRACORPOREAL SHOCK WAVE LITHOTRIPSY  2003  . KNEE ARTHROSCOPY    . PERIPHERAL VASCULAR CATHETERIZATION  12/26/2014   Procedure: VISCERAL ANGIOGRAPHY;  Surgeon: Serafina Mitchell, MD;  Location: [place] CATH LAB;  Service: Cardiovascular;;  . ROBOTIC ASSISTED TOTAL HYSTERECTOMY  10-15-2010   LAPAROSCOPIC /  LYSIS ADHESIONS  . TUBAL LIGATION  1999  . VIDEO ASSISTED THORACOSCOPY (VATS)/EMPYEMA Left 02/06/2015   Procedure: LEFT VIDEO ASSISTED THORACOSCOPY (VATS)/DRAINAGE OF EMPYEMA;  Surgeon: Grace Isaac, MD;  Location: [facility];  Service:  Thoracic;  Laterality: Left;  (L)VATS, DRAINAGE OF EMPYEMA  . VIDEO BRONCHOSCOPY N/A 02/06/2015   Procedure: VIDEO BRONCHOSCOPY;  Surgeon: Grace Isaac, MD;  Location: [facility] OR;  Service: Thoracic;  Laterality: N/A;    OB History    Gravida Para Term Preterm AB Living   2 2       2    SAB TAB Ectopic Multiple Live Births                   Home Medications    Prior to Admission medications   Medication Sig Start Date End Date Taking? Authorizing Provider  acetaminophen (TYLENOL) 500 MG tablet Take 1 tablet (500 mg total) by mouth every 6 (six) hours as needed for moderate pain. 03/07/15  Yes Regalado, Belkys A, MD  b complex vitamins tablet Take 1 tablet by mouth as needed (for energy).    Yes [provider]  buPROPion (WELLBUTRIN XL) 150 MG 24 hr tablet Take 1 tablet (150 mg total) by mouth daily. 07/30/17  Yes Mosie Lukes, MD  ibuprofen (ADVIL,MOTRIN) 600 MG tablet Take 1 tablet (600 mg total) by mouth every 8 (eight) hours as needed. 04/30/17  Yes Mosie Lukes, MD  levothyroxine (SYNTHROID, LEVOTHROID) 75 MCG tablet Take 75 mcg by mouth every morning. 07/23/17  Yes [provider]  traMADol (ULTRAM) 50 MG tablet TAKE 1-2 TALETS BY MOUTH EVERY 6 HOURS AS NEEDED FOR MODERATE PAIN 11/15/15  Yes Mosie Lukes, MD  levothyroxine (SYNTHROID, LEVOTHROID) 88 MCG tablet Take 1 tablet (88 mcg total) by mouth daily. Patient taking differently: Take 75 mcg by mouth daily.  07/31/17   Mosie Lukes, MD  omeprazole (PRILOSEC) 20 MG capsule Take 1 capsule (20 mg total) by mouth daily. 08/28/17   Payton Emerald, MD    Family History Family History  Problem Relation Age of Onset  . Diabetes Mother        type 2  . Asthma Mother   . Cancer Mother        skin cancer  . Cancer Father        bone  . Mental illness Sister        schizophrenia  . Alcohol abuse Maternal Grandmother   . Stroke Maternal Grandmother   . Aneurysm Maternal Grandmother        brain aneurysm  .  Other Maternal Grandfather        black lung  . Alzheimer's disease Paternal Grandmother   . Stroke Maternal Aunt     Social History Social History  Substance Use Topics  . Smoking status: Current Every Day Smoker    Packs/day: 0.75    Years: 30.00    Types: Cigarettes  . Smokeless tobacco: Never Used  . Alcohol use 0.0 oz/week     Comment: occasionally/socially     Allergies   Patient has no known allergies.   Review of Systems Review of Systems  Constitutional: Negative for chills and fever.  HENT: Negative for ear pain and sore throat.   Eyes: Negative for pain and visual disturbance.  Respiratory: Negative for cough and shortness of breath.  Cardiovascular: Positive for chest pain. Negative for palpitations.  Gastrointestinal: Negative for abdominal pain and vomiting.  Genitourinary: Negative for dysuria and hematuria.  Musculoskeletal: Negative for arthralgias and back pain.  Skin: Negative for color change and rash.  Neurological: Negative for seizures and syncope.  All other systems reviewed and are negative.    Physical Exam Updated Vital Signs BP 113/77   Pulse (!) 57   Temp 98.8 F (37.1 C) (Oral)   Resp 14   Ht 5\' 2"  (1.575 m)   Wt 91.6 kg (202 lb)   LMP 08/10/2010   SpO2 95%   BMI 36.95 kg/m   Physical Exam  Constitutional: She appears well-developed and well-nourished. No distress.  HENT:  Head: Normocephalic and atraumatic.  Eyes: Conjunctivae are normal.  Neck: Neck supple.  Cardiovascular: Normal rate and regular rhythm.   No murmur heard. Pulmonary/Chest: Effort normal and breath sounds normal. No respiratory distress.  Abdominal: Soft. There is no tenderness.  Musculoskeletal: She exhibits no edema.  Neurological: She is alert. No cranial nerve deficit. Coordination normal.  5/5 motor strength and intact sensation in all extremities. Finger-to-nose intact bilaterally  Skin: Skin is warm and dry.  Nursing note and vitals  reviewed.    ED Treatments / Results  Labs (all labs ordered are listed, but only abnormal results are displayed) Labs Reviewed  BASIC METABOLIC PANEL - Abnormal; Notable for the following:       Result Value   Glucose, Bld 109 (*)    All other components within normal limits  CBC - Abnormal; Notable for the following:    RBC 5.23 (*)    Hemoglobin 16.8 (*)    HCT 48.6 (*)    Platelets 133 (*)    All other components within normal limits  I-STAT TROPONIN, ED - Abnormal; Notable for the following:    Troponin i, poc 0.26 (*)    All other components within normal limits  TROPONIN I  TROPONIN I  TROPONIN I    EKG  EKG Interpretation  Date/Time:  Friday August 28 2017 20:36:35 EDT Ventricular Rate:  59 PR Interval:  144 QRS Duration: 106 QT Interval:  413 QTC Calculation: 410 R Axis:   -45 Text Interpretation:  Age not entered, assumed to be  51 years old for purpose of ECG interpretation Sinus rhythm LAD, consider left anterior fascicular block Low voltage, precordial leads RSR' in V1 or V2, right VCD or RVH Borderline T abnormalities, anterior leads Confirmed by Tanna Furry [phone]) on 08/28/2017 8:43:11 PM       Radiology Dg Chest 2 View  Result Date: 08/28/2017 CLINICAL DATA:  Chest pain, shortness of breath, tingling in LEFT arm for 2 weeks, past history of pneumothorax with chest tube 2 years ago, splenic artery aneurysm coiling, smoking, kidney stones EXAM: CHEST  2 VIEW COMPARISON:  04/16/2015 FINDINGS: Upper normal heart size. Mediastinal contours and pulmonary vascularity normal. Minimal chronic bronchitic changes. No acute infiltrate, pleural effusion, or pneumothorax. Embolization coils LEFT upper quadrant. No acute osseous findings. Question prior cholecystectomy. IMPRESSION: Minimal chronic bronchitic changes without acute infiltrate. Electronically Signed   By: Lavonia Dana M.D.   On: 08/28/2017 14:04    Procedures Procedures (including critical care  time)  Medications Ordered in ED Medications  nitroGLYCERIN (NITROSTAT) SL tablet 0.4 mg (0.4 mg Sublingual Given 08/28/17 2040)  aspirin chewable tablet 324 mg (324 mg Oral Given 08/28/17 2033)     Initial Impression / Assessment and Plan / ED Course  I have reviewed the triage vital signs and the nursing notes.  Pertinent labs & imaging results that were available during my care of the patient were reviewed by me and considered in my medical decision making (see chart for details).     4 yoF h/o tobacco use, obesity, HTN, HLD who p/w 2 weeks of intermittent low risk chest pain. HEAR score of 2. She is concerned about underlying cardiac process. Lungs CTAB. Overall well appearance.  EKG showing sinus rhythm. CXR showing no acute infiltrate. CBC, BMP unremarkable. Initial troponin undetectable. Discussed outpatient plan if second troponin wnl.  Serial I-stat troponin elevated to 0.26. ASA and nitro given. Pt experienced mild improvement of pain. Discussed with Dr. Radford Pax of Cardiology, who recommended sending formal troponin. Formal troponin remains undetectable. I-stat troponin appears undetectable.  Patient appears discontent despite reassuring troponins. Reassured on labwork and imaging. She is stable for continued outpatient followup. Pt requested copy of today's results, which was provided.   Return precautions provided for worsening symptoms. Pt will f/u with PCP at first availability. Pt verbalized agreement with plan.  Pt care d/w Dr. Jeneen Rinks  Final Clinical Impressions(s) / ED Diagnoses   Final diagnoses:  Chest pain, unspecified type    New Prescriptions Discharge Medication List as of 08/28/2017 11:34 PM    START taking these medications   Details  omeprazole (PRILOSEC) 20 MG capsule Take 1 capsule (20 mg total) by mouth daily., Starting Fri 08/28/2017, Print         Payton Emerald, MD 08/29/17 1212    Tanna Furry, MD 09/11/17 [phone]

## 2017-08-28 NOTE — Telephone Encounter (Signed)
Patient Name: Peggy Thomas DOB: [birth year] Initial Comment Caller states she is having issues with her heart fluttering. Burning sensation in the chest and a heaviness in the chest. Nurse Assessment Nurse: Melina Modena, RN, [place] Date/Time Eilene Ghazi Time): 08/28/2017 11:46:08 AM Confirm and document reason for call. If symptomatic, describe symptoms. ---Caller states she is having issues with her heart fluttering. Burning sensation in the chest and a heaviness in the chest. Started about two weeks ago. Burning/heaviness in chest on left. Does the patient have any new or worsening symptoms? ---Yes Will a triage be completed? ---Yes Related visit to physician within the last 2 weeks? ---No Does the PT have any chronic conditions? (i.e. diabetes, asthma, etc.) ---No Is the patient pregnant or possibly pregnant? (Ask all females between the ages of 99-55) ---No Is this a behavioral health or substance abuse call? ---No Guidelines Guideline Title Affirmed Question Affirmed Notes Chest Pain [1] Chest pain lasts > 5 minutes AND [2] described as crushing, pressure-like, or heavy Final Disposition User Call EMS 911 Now [place], RN, [employer] has an appointment scheduled with the office at 2:00, caller asked if she should cancel that, informed her yes to go ahead and cancel that. Caller understood recommendation but despite it, her husband is going to drive her into the ER. Referrals [facility] ED Caller Disagree/Comply Comply Caller Understands Yes PreDisposition Call Doctor

## 2017-08-28 NOTE — Discharge Instructions (Signed)
Please take prilosec daily. Please followup with Primary doctor and Cardiology as needed. Return to ED for worsening symptoms.

## 2017-08-28 NOTE — ED Provider Notes (Signed)
Patient seen and evaluated. Discussed with Dr. Rene Kocher. Patient has atypical left chest pain described as primary lead burning. Also felt "fluttering" in her left chest. Has had this for about 2 weeks. Some days only a few hours. Some nasal day long. EKG does not show acute or ischemic changes. First troponin normal. Second I-STAT troponin 0.26. Have her formal troponin and lab is 0. Consider the I-STAT A false positive. Patient's appropriate for discharge with low heart score. We'll recommend outpatient cartilage he follow-up.   Tanna Furry, MD 08/28/17 [id number]

## 2017-08-28 NOTE — ED Triage Notes (Signed)
Pt in NAD at this time.

## 2017-08-29 NOTE — ED Notes (Signed)
Pt verbalized understanding of d/c instructions and has no further questions. Pt is stable, A&Ox4, VSS.  

## 2017-09-04 ENCOUNTER — Ambulatory Visit (INDEPENDENT_AMBULATORY_CARE_PROVIDER_SITE_OTHER): Payer: Managed Care, Other (non HMO) | Admitting: Family Medicine

## 2017-09-04 VITALS — BP 90/68 | HR 77 | Temp 98.6°F | Resp 18 | Wt 207.0 lb

## 2017-09-04 DIAGNOSIS — F17200 Nicotine dependence, unspecified, uncomplicated: Secondary | ICD-10-CM | POA: Diagnosis not present

## 2017-09-04 DIAGNOSIS — E782 Mixed hyperlipidemia: Secondary | ICD-10-CM | POA: Diagnosis not present

## 2017-09-04 DIAGNOSIS — R739 Hyperglycemia, unspecified: Secondary | ICD-10-CM | POA: Diagnosis not present

## 2017-09-04 DIAGNOSIS — R0789 Other chest pain: Secondary | ICD-10-CM | POA: Diagnosis not present

## 2017-09-04 DIAGNOSIS — E039 Hypothyroidism, unspecified: Secondary | ICD-10-CM

## 2017-09-04 MED ORDER — OMEPRAZOLE 20 MG PO CPDR: 20.0000 mg | DELAYED_RELEASE_CAPSULE | Freq: Every day | ORAL | 0 refills | Status: DC

## 2017-09-04 MED ORDER — NITROGLYCERIN 0.4 MG SL SUBL: 0.4000 mg | SUBLINGUAL_TABLET | SUBLINGUAL | 1 refills | Status: DC | PRN

## 2017-09-04 MED ORDER — NICOTINE 7 MG/24HR TD PT24: 7.0000 mg | MEDICATED_PATCH | Freq: Every day | TRANSDERMAL | 1 refills | Status: DC

## 2017-09-04 MED ORDER — NICOTINE 14 MG/24HR TD PT24: 14.0000 mg | MEDICATED_PATCH | Freq: Every day | TRANSDERMAL | 1 refills | Status: DC

## 2017-09-04 MED ORDER — LEVOTHYROXINE SODIUM 88 MCG PO TABS: 88.0000 ug | ORAL_TABLET | Freq: Every day | ORAL | 0 refills | Status: DC

## 2017-09-04 MED ORDER — ASPIRIN EC 81 MG PO TBEC: 81.0000 mg | DELAYED_RELEASE_TABLET | Freq: Every day | ORAL | Status: DC

## 2017-09-04 NOTE — Progress Notes (Signed)
Subjective:  I acted as a Education administrator for Dr. Charlett Blake. [place], [place]  Patient ID: Peggy Thomas, female    DOB: 02-22-1966, 51 y.o.   MRN: [id number]  No chief complaint on file.   HPI  Patient is in today for a hospital follow up. She presented to ER with burning chest discomfort and sob after working in he ryard. She has had previous episodes but never as intense or with associated SOB. She took aspirin and it helped. In ER Troponin levels were normal except a POC Troponin was up some. She feels well today. Denies palp/HA/congestion/fevers/GI or GU c/o. Taking meds as prescribed  Patient Care Team: Mosie Lukes, MD as PCP - General (Family Medicine)   Past Medical History:  Diagnosis Date  . Allergic state 05/01/2017  . Anemia 03/31/2015  . Aneurysm ([place])    on spleen-watching   . Chronic idiopathic thrombocytopenia (HCC)    MILD--   HEMATOLOGIST--  DR Marin Olp  . Depression 04/30/2011  . History of kidney stones   . History of tobacco abuse 04/30/2011  . Hyperglycemia 07/30/2017  . Hyperlipidemia 07/30/2017  . Hypocalcemia   . Hypothyroidism 04/30/2011  . Nausea without vomiting 03/26/2015  . Obesity   . PONV (postoperative nausea and vomiting)   . Renal insufficiency   . Right ureteral stone   . Sinusitis 04/30/2017    Past Surgical History:  Procedure Laterality Date  . ABDOMINAL HYSTERECTOMY    . [place]  . CHOLECYSTECTOMY  2000  . CYSTOSCOPY WITH RETROGRADE PYELOGRAM, URETEROSCOPY AND STENT PLACEMENT Right 07/25/2013   Procedure: CYSTOSCOPY WITH RETROGRADE PYELOGRAM, URETEROSCOPY, STONE EXTRACTION AND POSSIBLE STENT PLACEMENT;  Surgeon: Ailene Rud, MD;  Location: [facility];  Service: Urology;  Laterality: Right;  . EMBOLIZATION N/A 12/26/2014   Procedure: EMBOLIZATION;  Surgeon: Serafina Mitchell, MD;  Location: [facility] CATH LAB;  Service: Cardiovascular;  Laterality: N/A;  . EXTRACORPOREAL SHOCK WAVE LITHOTRIPSY  2003  . KNEE  ARTHROSCOPY    . PERIPHERAL VASCULAR CATHETERIZATION  12/26/2014   Procedure: VISCERAL ANGIOGRAPHY;  Surgeon: Serafina Mitchell, MD;  Location: [facility] CATH LAB;  Service: Cardiovascular;;  . ROBOTIC ASSISTED TOTAL HYSTERECTOMY  10-15-2010   LAPAROSCOPIC /  LYSIS ADHESIONS  . TUBAL LIGATION  1999  . VIDEO ASSISTED THORACOSCOPY (VATS)/EMPYEMA Left 02/06/2015   Procedure: LEFT VIDEO ASSISTED THORACOSCOPY (VATS)/DRAINAGE OF EMPYEMA;  Surgeon: Grace Isaac, MD;  Location: [place];  Service: Thoracic;  Laterality: Left;  (L)VATS, DRAINAGE OF EMPYEMA  . VIDEO BRONCHOSCOPY N/A 02/06/2015   Procedure: VIDEO BRONCHOSCOPY;  Surgeon: Grace Isaac, MD;  Location: [facility] OR;  Service: Thoracic;  Laterality: N/A;    Family History  Problem Relation Age of Onset  . Diabetes Mother        type 2  . Asthma Mother   . Cancer Mother        skin cancer  . Cancer Father        bone  . Mental illness Sister        schizophrenia  . Alcohol abuse Maternal Grandmother   . Stroke Maternal Grandmother   . Aneurysm Maternal Grandmother        brain aneurysm  . Other Maternal Grandfather        black lung  . Alzheimer's disease Paternal Grandmother   . Stroke Maternal Aunt     Social History   Social History  . Marital status: Married    Spouse  name: N/A  . Number of children: N/A  . Years of education: N/A   Occupational History  . Not on file.   Social History Main Topics  . Smoking status: Current Every Day Smoker    Packs/day: 0.75    Years: 30.00    Types: Cigarettes  . Smokeless tobacco: Never Used  . Alcohol use 0.0 oz/week     Comment: occasionally/socially  . Drug use: No  . Sexual activity: Yes    Partners: Male    Birth control/ protection: Surgical     Comment: TLH/LOA   Other Topics Concern  . Not on file   Social History Narrative  . No narrative on file    Outpatient Medications Prior to Visit  Medication Sig Dispense Refill  . acetaminophen (TYLENOL) 500 MG tablet Take  1 tablet (500 mg total) by mouth every 6 (six) hours as needed for moderate pain. 30 tablet 0  . b complex vitamins tablet Take 1 tablet by mouth as needed (for energy).     Marland Kitchen buPROPion (WELLBUTRIN XL) 150 MG 24 hr tablet Take 1 tablet (150 mg total) by mouth daily. 30 tablet 5  . traMADol (ULTRAM) 50 MG tablet TAKE 1-2 TALETS BY MOUTH EVERY 6 HOURS AS NEEDED FOR MODERATE PAIN 60 tablet 0  . ibuprofen (ADVIL,MOTRIN) 600 MG tablet Take 1 tablet (600 mg total) by mouth every 8 (eight) hours as needed. 40 tablet 0  . levothyroxine (SYNTHROID, LEVOTHROID) 75 MCG tablet Take 75 mcg by mouth every morning.  6  . levothyroxine (SYNTHROID, LEVOTHROID) 88 MCG tablet Take 1 tablet (88 mcg total) by mouth daily. (Patient taking differently: Take 75 mcg by mouth daily. ) 90 tablet 1  . omeprazole (PRILOSEC) 20 MG capsule Take 1 capsule (20 mg total) by mouth daily. 30 capsule 0   No facility-administered medications prior to visit.     No Known Allergies  Review of Systems  Constitutional: Positive for malaise/fatigue. Negative for fever.  HENT: Negative for congestion.   Eyes: Negative for blurred vision.  Respiratory: Positive for shortness of breath.   Cardiovascular: Positive for chest pain. Negative for palpitations and leg swelling.  Gastrointestinal: Negative for abdominal pain, blood in stool and nausea.  Genitourinary: Negative for dysuria and frequency.  Musculoskeletal: Negative for falls.  Skin: Negative for rash.  Neurological: Negative for dizziness, loss of consciousness and headaches.  Endo/Heme/Allergies: Negative for environmental allergies.  Psychiatric/Behavioral: Negative for depression. The patient is not nervous/anxious.        Objective:    Physical Exam  Constitutional: She is oriented to person, place, and time. She appears well-developed and well-nourished. No distress.  HENT:  Head: Normocephalic and atraumatic.  Nose: Nose normal.  Eyes: Right eye exhibits no  discharge. Left eye exhibits no discharge.  Neck: Normal range of motion. Neck supple.  Cardiovascular: Normal rate and regular rhythm.   No murmur heard. Pulmonary/Chest: Effort normal and breath sounds normal.  Abdominal: Soft. Bowel sounds are normal. There is no tenderness.  Musculoskeletal: She exhibits no edema.  Neurological: She is alert and oriented to person, place, and time.  Skin: Skin is warm and dry.  Psychiatric: She has a normal mood and affect.  Nursing note and vitals reviewed.   BP 90/68 (BP Location: Left Arm, Patient Position: Sitting, Cuff Size: Normal)   Pulse 77   Temp 98.6 F (37 C) (Oral)   Resp 18   Wt 207 lb (93.9 kg)   LMP 08/10/2010  SpO2 97%   BMI 37.86 kg/m  Wt Readings from Last 3 Encounters:  09/04/17 207 lb (93.9 kg)  08/28/17 202 lb (91.6 kg)  07/30/17 205 lb 3.2 oz (93.1 kg)   BP Readings from Last 3 Encounters:  09/04/17 90/68  08/28/17 113/77  07/30/17 110/72     Immunization History  Administered Date(s) Administered  . Meningococcal Conjugate 12/18/2014  . Pneumococcal Polysaccharide-23 12/18/2014  . Tdap 09/16/2011    Health Maintenance  Topic Date Due  . PAP SMEAR  08/05/2013  . MAMMOGRAM  07/26/2016  . COLONOSCOPY  07/26/2016  . INFLUENZA VACCINE  06/10/2017  . TETANUS/TDAP  09/15/2021  . HIV Screening  Completed    Lab Results  Component Value Date   WBC 7.2 08/28/2017   HGB 16.8 (H) 08/28/2017   HCT 48.6 (H) 08/28/2017   PLT 133 (L) 08/28/2017   GLUCOSE 109 (H) 08/28/2017   CHOL 171 07/30/2017   TRIG 187.0 (H) 07/30/2017   HDL 41.90 07/30/2017   LDLDIRECT 111.0 04/30/2017   LDLCALC 92 07/30/2017   ALT 34 07/30/2017   AST 25 07/30/2017   NA 137 08/28/2017   K 4.4 08/28/2017   CL 104 08/28/2017   CREATININE 0.91 08/28/2017   BUN 11 08/28/2017   CO2 24 08/28/2017   TSH 12.07 (H) 07/30/2017   INR 1.01 05/04/2017   HGBA1C 5.7 07/30/2017    Lab Results  Component Value Date   TSH 12.07 (H) 07/30/2017    Lab Results  Component Value Date   WBC 7.2 08/28/2017   HGB 16.8 (H) 08/28/2017   HCT 48.6 (H) 08/28/2017   MCV 92.9 08/28/2017   PLT 133 (L) 08/28/2017   Lab Results  Component Value Date   NA 137 08/28/2017   K 4.4 08/28/2017   CO2 24 08/28/2017   GLUCOSE 109 (H) 08/28/2017   BUN 11 08/28/2017   CREATININE 0.91 08/28/2017   BILITOT 0.5 07/30/2017   ALKPHOS 64 07/30/2017   AST 25 07/30/2017   ALT 34 07/30/2017   PROT 7.1 07/30/2017   ALBUMIN 4.1 07/30/2017   CALCIUM 9.7 08/28/2017   ANIONGAP 9 08/28/2017   GFR 68.40 07/30/2017   Lab Results  Component Value Date   CHOL 171 07/30/2017   Lab Results  Component Value Date   HDL 41.90 07/30/2017   Lab Results  Component Value Date   LDLCALC 92 07/30/2017   Lab Results  Component Value Date   TRIG 187.0 (H) 07/30/2017   Lab Results  Component Value Date   CHOLHDL 4 07/30/2017   Lab Results  Component Value Date   HGBA1C 5.7 07/30/2017         Assessment & Plan:   Problem List Items Addressed This Visit    Tobacco use disorder    Encouraged complete cessation. Discussed need to quit as relates to risk of numerous cancers, cardiac and pulmonary disease as well as neurologic complications. Counseled for greater than 3 minutes. Given rx for Nicotine patches 14 to use first then dropped to 7 for 1 to 2 months. advised this will greatly decrease her risk of a cardiac event.       Hypothyroidism    On Levothyroxine, continue to monitor      Relevant Medications   levothyroxine (SYNTHROID, LEVOTHROID) 88 MCG tablet   Hyperglycemia    minimize simple carbs. Increase exercise as tolerated.       Hyperlipidemia    /\Encouraged heart healthy diet, increase exercise, avoid trans fats,  consider a krill oil cap daily      Relevant Medications   nitroGLYCERIN (NITROSTAT) 0.4 MG SL tablet   aspirin EC 81 MG tablet   Atypical chest pain - Primary    Was recently seen in ER with a burning sensation in her  chest after doing a good bit of yard work. With multiple risk factors she will be referred to cardiology for further consideration. Start 81 mg aspirin daily and given NTG to use prn if pain returns. There was a belief in ER Omeprazole could help silent heartburn. Encouraged to sart it for now.       Relevant Orders   Ambulatory referral to Cardiology      I have discontinued Ms. Tarkington's ibuprofen. I am also having her start on nitroGLYCERIN, aspirin EC, nicotine, and nicotine. Additionally, I am having her maintain her b complex vitamins, acetaminophen, traMADol, buPROPion, levothyroxine, and omeprazole.  Meds ordered this encounter  Medications  . nitroGLYCERIN (NITROSTAT) 0.4 MG SL tablet    Sig: Place 1 tablet (0.4 mg total) under the tongue every 5 (five) minutes as needed for chest pain.    Dispense:  25 tablet    Refill:  1  . aspirin EC 81 MG tablet    Sig: Take 1 tablet (81 mg total) by mouth daily.  Marland Kitchen levothyroxine (SYNTHROID, LEVOTHROID) 88 MCG tablet    Sig: Take 1 tablet (88 mcg total) by mouth daily.    Dispense:  90 tablet    Refill:  0  . omeprazole (PRILOSEC) 20 MG capsule    Sig: Take 1 capsule (20 mg total) by mouth daily.    Dispense:  30 capsule    Refill:  0  . nicotine (NICODERM CQ - DOSED IN MG/24 HOURS) 14 mg/24hr patch    Sig: Place 1 patch (14 mg total) onto the skin daily.    Dispense:  30 patch    Refill:  1  . nicotine (NICODERM CQ - DOSED IN MG/24 HR) 7 mg/24hr patch    Sig: Place 1 patch (7 mg total) onto the skin daily.    Dispense:  30 patch    Refill:  1    CMA served as Education administrator during this visit. History, Physical and Plan performed by medical provider. Documentation and orders reviewed and attested to.  Penni Homans, MD

## 2017-09-04 NOTE — Patient Instructions (Signed)
Myl[place] as needed for burning chest pain Chest Wall Pain Chest wall pain is pain in or around the bones and muscles of your chest. Sometimes, an injury causes this pain. Sometimes, the cause may not be known. This pain may take several weeks or longer to get better. Follow these instructions at home: Pay attention to any changes in your symptoms. Take these actions to help with your pain:  Rest as told by your health care provider.  Avoid activities that cause pain. These include any activities that use your chest muscles or your abdominal and side muscles to lift heavy items.  If directed, apply ice to the painful area: ? Put ice in a plastic bag. ? Place a towel between your skin and the bag. ? Leave the ice on for 20 minutes, 2-3 times per day.  Take over-the-counter and prescription medicines only as told by your health care provider.  Do not use tobacco products, including cigarettes, chewing tobacco, and e-cigarettes. If you need help quitting, ask your health care provider.  Keep all follow-up visits as told by your health care provider. This is important.  Contact a health care provider if:  You have a fever.  Your chest pain becomes worse.  You have new symptoms. Get help right away if:  You have nausea or vomiting.  You feel sweaty or light-headed.  You have a cough with phlegm (sputum) or you cough up blood.  You develop shortness of breath. This information is not intended to replace advice given to you by your health care provider. Make sure you discuss any questions you have with your health care provider. Document Released: 10/27/2005 Document Revised: 03/06/2016 Document Reviewed: 01/22/2015 Elsevier Interactive Patient Education  2017 [employer].

## 2017-09-06 DIAGNOSIS — R0789 Other chest pain: Secondary | ICD-10-CM | POA: Insufficient documentation

## 2017-09-06 NOTE — Assessment & Plan Note (Signed)
minimize simple carbs. Increase exercise as tolerated.  

## 2017-09-06 NOTE — Assessment & Plan Note (Signed)
Encouraged heart healthy diet, increase exercise, avoid trans fats, consider a krill oil cap daily 

## 2017-09-06 NOTE — Assessment & Plan Note (Signed)
On Levothyroxine, continue to monitor 

## 2017-09-06 NOTE — Assessment & Plan Note (Addendum)
Encouraged complete cessation. Discussed need to quit as relates to risk of numerous cancers, cardiac and pulmonary disease as well as neurologic complications. Counseled for greater than 3 minutes. Given rx for Nicotine patches 14 to use first then dropped to 7 for 1 to 2 months. advised this will greatly decrease her risk of a cardiac event.

## 2017-09-06 NOTE — Assessment & Plan Note (Signed)
Was recently seen in ER with a burning sensation in her chest after doing a good bit of yard work. With multiple risk factors she will be referred to cardiology for further consideration. Start 81 mg aspirin daily and given NTG to use prn if pain returns. There was a belief in ER Omeprazole could help silent heartburn. Encouraged to sart it for now.

## 2017-09-08 ENCOUNTER — Ambulatory Visit (INDEPENDENT_AMBULATORY_CARE_PROVIDER_SITE_OTHER): Payer: Managed Care, Other (non HMO) | Admitting: Cardiology

## 2017-09-08 ENCOUNTER — Encounter: Payer: Self-pay | Admitting: Cardiology

## 2017-09-08 VITALS — BP 132/80 | HR 61 | Ht 62.0 in | Wt 206.0 lb

## 2017-09-08 DIAGNOSIS — I200 Unstable angina: Secondary | ICD-10-CM

## 2017-09-08 DIAGNOSIS — E782 Mixed hyperlipidemia: Secondary | ICD-10-CM | POA: Diagnosis not present

## 2017-09-08 DIAGNOSIS — F17200 Nicotine dependence, unspecified, uncomplicated: Secondary | ICD-10-CM

## 2017-09-08 NOTE — Patient Instructions (Addendum)
Medication Instructions:  Your physician recommends that you continue on your current medications as directed. Please refer to the Current Medication list given to you today.   Labwork: none  Testing/Procedures: Non-Cardiac CT Angiography (CTA), is a special type of CT scan that uses a computer to produce multi-dimensional views of major blood vessels throughout the body. In CT angiography, a contrast material is injected through an IV to help visualize the blood vessels   Please arrive at the [facility] main entrance of [facility] at _________AM/PM (30-45 minutes prior to test start time)  [facility] [place], [place] [address] [phone]  Proceed to the [facility] Radiology Department (First Floor).  Please follow these instructions carefully (unless otherwise directed):  Hold all erectile dysfunction medications at least 48 hours prior to test.  On the Night Before the Test: . Drink plenty of water. . Do not consume any caffeinated/decaffeinated beverages or chocolate 12 hours prior to your test. . Do not take any antihistamines 12 hours prior to your test. . If you take Metformin do not take 24 hours prior to test. . If the patient has contrast allergy: ? Patient will need a prescription for Prednisone and very clear instructions (as follows): 1. Prednisone 50 mg - take 13 hours prior to test 2. Take another Prednisone 50 mg 7 hours prior to test 3. Take another Prednisone 50 mg 1 hour prior to test 4. Take Benadryl 50 mg 1 hour prior to test . Patient must complete all four doses of above prophylactic medications. . Patient will need a ride after test due to Benadryl.  On the Day of the Test: . Drink plenty of water. Do not drink any water within one hour of the test. . Do not eat any food 4 hours prior to the test. . You may take your regular medications prior to the test. . IF NOT ON A BETA BLOCKER - Take 50 mg of lopressor  (metoprolol) one hour before the test. . HOLD Furosemide morning of the test.  After the Test: . Drink plenty of water. . After receiving IV contrast, you may experience a mild flushed feeling. This is normal. . On occasion, you may experience a mild rash up to 24 hours after the test. This is not dangerous. If this occurs, you can take Benadryl 25 mg and increase your fluid intake. . If you experience trouble breathing, this can be serious. If it is severe call 911 IMMEDIATELY. If it is mild, please call our office. . If you take any of these medications: Glipizide/Metformin, Avandament, Glucavance, please do not take 48 hours after completing test.   Follow-Up: Your physician recommends that you schedule a follow-up appointment in: 2 months with Dr. Meda Coffee - December 17th at 8:20am.   Any Other Special Instructions Will Be Listed Below (If Applicable).     If you need a refill on your cardiac medications before your next appointment, please call your pharmacy.

## 2017-09-08 NOTE — Progress Notes (Signed)
Cardiology Office Note    Date:  09/08/2017   ID:  Peggy Thomas, DOB 07-06-1966, MRN [id number]  PCP:  Mosie Lukes, MD  Cardiologist:   Ena Dawley, MD   Chief complaint: TOC 14 days, chest pain  History of Present Illness:  Peggy Thomas is a 51 y.o. female with prior medical history of ongoing smoking, hyperlipidemia, otherwise no known hypertension, or family history of premature coronary artery disease who is coming for ER follow-up for her chest pain.  The patient has noticed that over the summer months she has became progressively more short of breath initially with exertion, but attributed it to deconditioning and obesity.  On one occasion she had to walk 30 stairs and developed retrosternal pressure-like chest pain that was radiating to her neck and left arm that has resolved within 5 minutes at rest.  Last week she has developed retrosternal chest pain, she was not sure if this was after excessive exercise, that day she was cleaning her yard harder than usual.  While in the ER POC troponin positive at 0.26 however the high-sensitivity troponin negative x3. She has stopped smoking and currently using nicotine patches.  Past Medical History:  Diagnosis Date  . Allergic state 05/01/2017  . Anemia 03/31/2015  . Aneurysm ([place])    on spleen-watching   . Chronic idiopathic thrombocytopenia (HCC)    MILD--   HEMATOLOGIST--  DR Marin Olp  . Depression 04/30/2011  . History of kidney stones   . History of tobacco abuse 04/30/2011  . Hyperglycemia 07/30/2017  . Hyperlipidemia 07/30/2017  . Hypocalcemia   . Hypothyroidism 04/30/2011  . Nausea without vomiting 03/26/2015  . Obesity   . PONV (postoperative nausea and vomiting)   . Renal insufficiency   . Right ureteral stone   . Sinusitis 04/30/2017    Past Surgical History:  Procedure Laterality Date  . ABDOMINAL HYSTERECTOMY    . [place]  . CHOLECYSTECTOMY  2000  . CYSTOSCOPY WITH RETROGRADE  PYELOGRAM, URETEROSCOPY AND STENT PLACEMENT Right 07/25/2013   Procedure: CYSTOSCOPY WITH RETROGRADE PYELOGRAM, URETEROSCOPY, STONE EXTRACTION AND POSSIBLE STENT PLACEMENT;  Surgeon: Ailene Rud, MD;  Location: [facility];  Service: Urology;  Laterality: Right;  . EMBOLIZATION N/A 12/26/2014   Procedure: EMBOLIZATION;  Surgeon: Serafina Mitchell, MD;  Location: [facility] CATH LAB;  Service: Cardiovascular;  Laterality: N/A;  . EXTRACORPOREAL SHOCK WAVE LITHOTRIPSY  2003  . KNEE ARTHROSCOPY    . PERIPHERAL VASCULAR CATHETERIZATION  12/26/2014   Procedure: VISCERAL ANGIOGRAPHY;  Surgeon: Serafina Mitchell, MD;  Location: [facility] CATH LAB;  Service: Cardiovascular;;  . ROBOTIC ASSISTED TOTAL HYSTERECTOMY  10-15-2010   LAPAROSCOPIC /  LYSIS ADHESIONS  . TUBAL LIGATION  1999  . VIDEO ASSISTED THORACOSCOPY (VATS)/EMPYEMA Left 02/06/2015   Procedure: LEFT VIDEO ASSISTED THORACOSCOPY (VATS)/DRAINAGE OF EMPYEMA;  Surgeon: Grace Isaac, MD;  Location: [facility];  Service: Thoracic;  Laterality: Left;  (L)VATS, DRAINAGE OF EMPYEMA  . VIDEO BRONCHOSCOPY N/A 02/06/2015   Procedure: VIDEO BRONCHOSCOPY;  Surgeon: Grace Isaac, MD;  Location: [facility] OR;  Service: Thoracic;  Laterality: N/A;    Current Medications: Outpatient Medications Prior to Visit  Medication Sig Dispense Refill  . acetaminophen (TYLENOL) 500 MG tablet Take 1 tablet (500 mg total) by mouth every 6 (six) hours as needed for moderate pain. 30 tablet 0  . aspirin EC 81 MG tablet Take 1 tablet (81 mg total) by mouth daily.    Marland Kitchen  b complex vitamins tablet Take 1 tablet by mouth as needed (for energy).     Marland Kitchen buPROPion (WELLBUTRIN XL) 150 MG 24 hr tablet Take 1 tablet (150 mg total) by mouth daily. 30 tablet 5  . nicotine (NICODERM CQ - DOSED IN MG/24 HOURS) 14 mg/24hr patch Place 1 patch (14 mg total) onto the skin daily. 30 patch 1  . nicotine (NICODERM CQ - DOSED IN MG/24 HR) 7 mg/24hr patch Place 1 patch (7 mg total) onto the skin  daily. 30 patch 1  . nitroGLYCERIN (NITROSTAT) 0.4 MG SL tablet Place 1 tablet (0.4 mg total) under the tongue every 5 (five) minutes as needed for chest pain. 25 tablet 1  . omeprazole (PRILOSEC) 20 MG capsule Take 1 capsule (20 mg total) by mouth daily. 30 capsule 0  . traMADol (ULTRAM) 50 MG tablet TAKE 1-2 TALETS BY MOUTH EVERY 6 HOURS AS NEEDED FOR MODERATE PAIN 60 tablet 0  . levothyroxine (SYNTHROID, LEVOTHROID) 88 MCG tablet Take 1 tablet (88 mcg total) by mouth daily. (Patient not taking: Reported on 09/08/2017) 90 tablet 0   No facility-administered medications prior to visit.      Allergies:   Patient has no known allergies.   Social History   Social History  . Marital status: Married    Spouse name: N/A  . Number of children: N/A  . Years of education: N/A   Social History Main Topics  . Smoking status: Current Every Day Smoker    Packs/day: 0.75    Years: 30.00    Types: Cigarettes  . Smokeless tobacco: Never Used  . Alcohol use 0.0 oz/week     Comment: occasionally/socially  . Drug use: No  . Sexual activity: Yes    Partners: Male    Birth control/ protection: Surgical     Comment: TLH/LOA   Other Topics Concern  . None   Social History Narrative  . None     Family History:  The patient's family history includes Alcohol abuse in her maternal grandmother; Alzheimer's disease in her paternal grandmother; Aneurysm in her maternal grandmother; Asthma in her mother; Cancer in her father and mother; Diabetes in her mother; Mental illness in her sister; Other in her maternal grandfather; Stroke in her maternal aunt and maternal grandmother.   ROS:   Please see the history of present illness.    ROS All other systems reviewed and are negative.   PHYSICAL EXAM:   VS:  BP 132/80   Pulse 61   Ht 5\' 2"  (1.575 m)   Wt 206 lb (93.4 kg)   LMP 08/10/2010   SpO2 96%   BMI 37.68 kg/m    GEN: Well nourished, well developed, in no acute distress  HEENT: normal    Neck: no JVD, carotid bruits, or masses Cardiac: RRR; mild 2/6 systolic murmur, rubs, or gallops,no edema  Respiratory:  clear to auscultation bilaterally, normal work of breathing GI: soft, nontender, nondistended, + BS MS: no deformity or atrophy  Skin: warm and dry, no rash Neuro:  Alert and Oriented x 3, Strength and sensation are intact Psych: euthymic mood, full affect  Wt Readings from Last 3 Encounters:  09/08/17 206 lb (93.4 kg)  09/04/17 207 lb (93.9 kg)  08/28/17 202 lb (91.6 kg)    Studies/Labs Reviewed:   EKG:  EKG is not ordered today.  The ekg performed in ER showed sinus rhythm, left axis deviation, negative T waves in leads V3 and V4.  Poor R wave  progression in the anterior leads.  Recent Labs: 07/30/2017: ALT 34; TSH 12.07 08/28/2017: BUN 11; Creatinine, Ser 0.91; Hemoglobin 16.8; Platelets 133; Potassium 4.4; Sodium 137   Lipid Panel    Component Value Date/Time   CHOL 171 07/30/2017 0823   TRIG 187.0 (H) 07/30/2017 0823   HDL 41.90 07/30/2017 0823   CHOLHDL 4 07/30/2017 0823   VLDL 37.4 07/30/2017 0823   LDLCALC 92 07/30/2017 0823   LDLDIRECT 111.0 04/30/2017 1609      ASSESSMENT:    1. Unstable angina pectoris ([place])   2. Mixed hyperlipidemia   3. Smoking      PLAN:  In order of problems listed above:  1. Patient has symptoms consistent with typical chest pain, or risk factors include smoking, hyperlipidemia and physical inactivity.  We will perform calcium score and coronary CTA that will determine if she has any obstructive coronary artery disease.  In case she does not have obstructive CAD, coronary CTA would help [place] manage her further treatment for hyperlipidemia.  In the meantime she is advised to quit smoking using nicotine patches.  Advised of appropriate diet.  She is also advised not to exercise until her test results.    Medication Adjustments/Labs and Tests Ordered: Current medicines are reviewed at length with the patient today.   Concerns regarding medicines are outlined above.  Medication changes, Labs and Tests ordered today are listed in the Patient Instructions below. Patient Instructions  Medication Instructions:  Your physician recommends that you continue on your current medications as directed. Please refer to the Current Medication list given to you today.   Labwork: none  Testing/Procedures: Non-Cardiac CT Angiography (CTA), is a special type of CT scan that uses a computer to produce multi-dimensional views of major blood vessels throughout the body. In CT angiography, a contrast material is injected through an IV to help visualize the blood vessels   Please arrive at the [facility] main entrance of [facility] at _________AM/PM (30-45 minutes prior to test start time)  [facility] [place], [place] [address] [phone]  Proceed to the [facility] Radiology Department (First Floor).  Please follow these instructions carefully (unless otherwise directed):  Hold all erectile dysfunction medications at least 48 hours prior to test.  On the Night Before the Test: . Drink plenty of water. . Do not consume any caffeinated/decaffeinated beverages or chocolate 12 hours prior to your test. . Do not take any antihistamines 12 hours prior to your test. . If you take Metformin do not take 24 hours prior to test. . If the patient has contrast allergy: ? Patient will need a prescription for Prednisone and very clear instructions (as follows): 1. Prednisone 50 mg - take 13 hours prior to test 2. Take another Prednisone 50 mg 7 hours prior to test 3. Take another Prednisone 50 mg 1 hour prior to test 4. Take Benadryl 50 mg 1 hour prior to test . Patient must complete all four doses of above prophylactic medications. . Patient will need a ride after test due to Benadryl.  On the Day of the Test: . Drink plenty of water. Do not drink any water within one hour of the  test. . Do not eat any food 4 hours prior to the test. . You may take your regular medications prior to the test. . IF NOT ON A BETA BLOCKER - Take 50 mg of lopressor (metoprolol) one hour before the test. . HOLD Furosemide morning  of the test.  After the Test: . Drink plenty of water. . After receiving IV contrast, you may experience a mild flushed feeling. This is normal. . On occasion, you may experience a mild rash up to 24 hours after the test. This is not dangerous. If this occurs, you can take Benadryl 25 mg and increase your fluid intake. . If you experience trouble breathing, this can be serious. If it is severe call 911 IMMEDIATELY. If it is mild, please call our office. . If you take any of these medications: Glipizide/Metformin, Avandament, Glucavance, please do not take 48 hours after completing test.   Follow-Up: Your physician recommends that you schedule a follow-up appointment in: 2 months with Dr. Meda Coffee - December 17th at 8:20am.   Any Other Special Instructions Will Be Listed Below (If Applicable).     If you need a refill on your cardiac medications before your next appointment, please call your pharmacy.      Signed, Ena Dawley, MD  09/08/2017 4:45 PM    [facility] [address], [place], [place]  [address] Phone: [phone]; Fax: [phone]

## 2017-09-29 ENCOUNTER — Telehealth: Payer: Self-pay | Admitting: Cardiology

## 2017-09-29 DIAGNOSIS — E785 Hyperlipidemia, unspecified: Secondary | ICD-10-CM

## 2017-09-29 NOTE — Telephone Encounter (Signed)
Order placed for pt to come in for a BMET on 12/5, per coronary ct protocol.

## 2017-09-29 NOTE — Telephone Encounter (Signed)
Peggy Thomas is returning your call

## 2017-10-14 ENCOUNTER — Other Ambulatory Visit: Payer: Managed Care, Other (non HMO) | Admitting: *Deleted

## 2017-10-14 DIAGNOSIS — E785 Hyperlipidemia, unspecified: Secondary | ICD-10-CM

## 2017-10-15 LAB — BASIC METABOLIC PANEL
BUN/Creatinine Ratio: 14 (ref 9–23)
BUN: 13 mg/dL (ref 6–24)
CO2: 26 mmol/L (ref 20–29)
Calcium: 9.7 mg/dL (ref 8.7–10.2)
Chloride: 105 mmol/L (ref 96–106)
Creatinine, Ser: 0.96 mg/dL (ref 0.57–1.00)
GFR calc Af Amer: 79 mL/min/{1.73_m2} (ref >59)
GFR calc non Af Amer: 69 mL/min/{1.73_m2} (ref >59)
Glucose: 80 mg/dL (ref 65–99)
Potassium: 5 mmol/L (ref 3.5–5.2)
Sodium: 144 mmol/L (ref 134–144)

## 2017-10-21 ENCOUNTER — Ambulatory Visit (HOSPITAL_COMMUNITY)
Admission: RE | Admit: 2017-10-21 | Discharge: 2017-10-21 | Disposition: A | Discharge status: Home / Self Care | Payer: Managed Care, Other (non HMO) | Source: Ambulatory Visit | Attending: Cardiology | Admitting: Cardiology

## 2017-10-21 ENCOUNTER — Ambulatory Visit (HOSPITAL_COMMUNITY): Admission: RE | Admit: 2017-10-21 | Payer: Managed Care, Other (non HMO) | Source: Ambulatory Visit

## 2017-10-21 DIAGNOSIS — I200 Unstable angina: Secondary | ICD-10-CM | POA: Diagnosis not present

## 2017-10-21 DIAGNOSIS — R079 Chest pain, unspecified: Secondary | ICD-10-CM | POA: Diagnosis not present

## 2017-10-21 MED ORDER — METOPROLOL TARTRATE 5 MG/5ML IV SOLN
5.0000 mg | INTRAVENOUS | Status: DC | PRN
  Administered 2017-10-21: 5 mg via INTRAVENOUS

## 2017-10-21 MED ORDER — METOPROLOL TARTRATE 5 MG/5ML IV SOLN
INTRAVENOUS | Status: AC
  Filled 2017-10-21: qty 10

## 2017-10-21 MED ORDER — IOPAMIDOL (ISOVUE-370) INJECTION 76%
INTRAVENOUS | Status: AC
  Filled 2017-10-21: qty 100

## 2017-10-21 MED ORDER — IOPAMIDOL (ISOVUE-370) INJECTION 76%
100.0000 mL | Freq: Once | INTRAVENOUS | Status: AC | PRN
  Administered 2017-10-21: 100 mL via INTRAVENOUS

## 2017-10-21 MED ORDER — NITROGLYCERIN 0.4 MG SL SUBL
0.8000 mg | SUBLINGUAL_TABLET | Freq: Once | SUBLINGUAL | Status: AC
  Administered 2017-10-21: 0.8 mg via SUBLINGUAL

## 2017-10-21 MED ORDER — NITROGLYCERIN 0.4 MG SL SUBL
SUBLINGUAL_TABLET | SUBLINGUAL | Status: AC
  Filled 2017-10-21: qty 2

## 2017-10-21 NOTE — Progress Notes (Signed)
Patient tolerated CT without incident. Given a coke and did not want crackers. Tolerated well. Ambulatory steady gait.

## 2017-10-26 ENCOUNTER — Ambulatory Visit: Payer: Managed Care, Other (non HMO) | Admitting: Cardiology

## 2017-10-26 ENCOUNTER — Encounter: Payer: Self-pay | Admitting: Cardiology

## 2017-10-26 VITALS — Ht 62.0 in

## 2017-10-26 DIAGNOSIS — E785 Hyperlipidemia, unspecified: Secondary | ICD-10-CM | POA: Diagnosis not present

## 2017-10-26 DIAGNOSIS — R0789 Other chest pain: Secondary | ICD-10-CM

## 2017-10-26 DIAGNOSIS — F17200 Nicotine dependence, unspecified, uncomplicated: Secondary | ICD-10-CM | POA: Diagnosis not present

## 2017-10-26 NOTE — Progress Notes (Signed)
Cardiology Office Note    Date:  10/26/2017   ID:  Peggy Thomas, DOB 05-Apr-1966, MRN [id number]  PCP:  Mosie Lukes, MD  Cardiologist:   Ena Dawley, MD   Chief complaint: TOC 14 days, chest pain  History of Present Illness:  Peggy Thomas is a 51 y.o. female with prior medical history of ongoing smoking, hyperlipidemia, otherwise no known hypertension, or family history of premature coronary artery disease who is coming for ER follow-up for her chest pain.  The patient has noticed that over the summer months she has became progressively more short of breath initially with exertion, but attributed it to deconditioning and obesity.  On one occasion she had to walk 30 stairs and developed retrosternal pressure-like chest pain that was radiating to her neck and left arm that has resolved within 5 minutes at rest.  Last week she has developed retrosternal chest pain, she was not sure if this was after excessive exercise, that day she was cleaning her yard harder than usual.  While in the ER POC troponin positive at 0.26 however the high-sensitivity troponin negative x3. She has stopped smoking and currently using nicotine patches.  10/26/2017 - patient is coming after 3 months, she explained that she has been through very difficult times with a lot of emotional stress, she has 2 kids in their early 81s that recently moved out of the house, her sister-in-law had a stroke and her husband has received 3 stents. She has been trying to quit smoking, she obtained patches but hasn't started to use them yet. She continues to smoke. She has had no more chest pain or shortness of breath, no lower extremity edema orthopnea paroxysmal nocturnal dyspnea no palpitations or syncope.    Past Medical History:  Diagnosis Date  . Allergic state 05/01/2017  . Anemia 03/31/2015  . Aneurysm ([place])    on spleen-watching   . Chronic idiopathic thrombocytopenia (HCC)    MILD--   HEMATOLOGIST--  DR Marin Olp    . Depression 04/30/2011  . History of kidney stones   . History of tobacco abuse 04/30/2011  . Hyperglycemia 07/30/2017  . Hyperlipidemia 07/30/2017  . Hypocalcemia   . Hypothyroidism 04/30/2011  . Nausea without vomiting 03/26/2015  . Obesity   . PONV (postoperative nausea and vomiting)   . Renal insufficiency   . Right ureteral stone   . Sinusitis 04/30/2017    Past Surgical History:  Procedure Laterality Date  . ABDOMINAL HYSTERECTOMY    . [place]  . CHOLECYSTECTOMY  2000  . CYSTOSCOPY WITH RETROGRADE PYELOGRAM, URETEROSCOPY AND STENT PLACEMENT Right 07/25/2013   Procedure: CYSTOSCOPY WITH RETROGRADE PYELOGRAM, URETEROSCOPY, STONE EXTRACTION AND POSSIBLE STENT PLACEMENT;  Surgeon: Ailene Rud, MD;  Location: [facility];  Service: Urology;  Laterality: Right;  . EMBOLIZATION N/A 12/26/2014   Procedure: EMBOLIZATION;  Surgeon: Serafina Mitchell, MD;  Location: [facility] CATH LAB;  Service: Cardiovascular;  Laterality: N/A;  . EXTRACORPOREAL SHOCK WAVE LITHOTRIPSY  2003  . KNEE ARTHROSCOPY    . PERIPHERAL VASCULAR CATHETERIZATION  12/26/2014   Procedure: VISCERAL ANGIOGRAPHY;  Surgeon: Serafina Mitchell, MD;  Location: [facility] CATH LAB;  Service: Cardiovascular;;  . ROBOTIC ASSISTED TOTAL HYSTERECTOMY  10-15-2010   LAPAROSCOPIC /  LYSIS ADHESIONS  . TUBAL LIGATION  1999  . VIDEO ASSISTED THORACOSCOPY (VATS)/EMPYEMA Left 02/06/2015   Procedure: LEFT VIDEO ASSISTED THORACOSCOPY (VATS)/DRAINAGE OF EMPYEMA;  Surgeon: Grace Isaac, MD;  Location: [facility];  Service: Thoracic;  Laterality: Left;  (L)VATS, DRAINAGE OF EMPYEMA  . VIDEO BRONCHOSCOPY N/A 02/06/2015   Procedure: VIDEO BRONCHOSCOPY;  Surgeon: Grace Isaac, MD;  Location: [facility] OR;  Service: Thoracic;  Laterality: N/A;    Current Medications: Outpatient Medications Prior to Visit  Medication Sig Dispense Refill  . acetaminophen (TYLENOL) 500 MG tablet Take 1 tablet (500 mg total) by mouth every 6  (six) hours as needed for moderate pain. 30 tablet 0  . aspirin EC 81 MG tablet Take 1 tablet (81 mg total) by mouth daily.    Marland Kitchen b complex vitamins tablet Take 1 tablet by mouth as needed (for energy).     Marland Kitchen levothyroxine (SYNTHROID, LEVOTHROID) 88 MCG tablet Take 88 mcg by mouth daily before breakfast.    . nitroGLYCERIN (NITROSTAT) 0.4 MG SL tablet Place 1 tablet (0.4 mg total) under the tongue every 5 (five) minutes as needed for chest pain. 25 tablet 1  . traMADol (ULTRAM) 50 MG tablet TAKE 1-2 TALETS BY MOUTH EVERY 6 HOURS AS NEEDED FOR MODERATE PAIN 60 tablet 0  . nicotine (NICODERM CQ - DOSED IN MG/24 HOURS) 14 mg/24hr patch Place 1 patch (14 mg total) onto the skin daily. (Patient not taking: Reported on 10/21/2017) 30 patch 1  . nicotine (NICODERM CQ - DOSED IN MG/24 HR) 7 mg/24hr patch Place 1 patch (7 mg total) onto the skin daily. (Patient not taking: Reported on 10/21/2017) 30 patch 1  . buPROPion (WELLBUTRIN XL) 150 MG 24 hr tablet Take 1 tablet (150 mg total) by mouth daily. (Patient not taking: Reported on 10/21/2017) 30 tablet 5  . levothyroxine (SYNTHROID, LEVOTHROID) 75 MCG tablet Take 75 mcg by mouth daily before breakfast.    . omeprazole (PRILOSEC) 20 MG capsule Take 1 capsule (20 mg total) by mouth daily. (Patient not taking: Reported on 10/21/2017) 30 capsule 0   No facility-administered medications prior to visit.      Allergies:   Patient has no known allergies.   Social History   Socioeconomic History  . Marital status: Married    Spouse name: None  . Number of children: None  . Years of education: None  . Highest education level: None  Social Needs  . Financial resource strain: None  . Food insecurity - worry: None  . Food insecurity - inability: None  . Transportation needs - medical: None  . Transportation needs - non-medical: None  Occupational History  . None  Tobacco Use  . Smoking status: Current Every Day Smoker    Packs/day: 0.75    Years: 30.00     Pack years: 22.50    Types: Cigarettes  . Smokeless tobacco: Never Used  Substance and Sexual Activity  . Alcohol use: Yes    Alcohol/week: 0.0 oz    Comment: occasionally/socially  . Drug use: No  . Sexual activity: Yes    Partners: Male    Birth control/protection: Surgical    Comment: TLH/LOA  Other Topics Concern  . None  Social History Narrative  . None     Family History:  The patient's family history includes Alcohol abuse in her maternal grandmother; Alzheimer's disease in her paternal grandmother; Aneurysm in her maternal grandmother; Asthma in her mother; Cancer in her father and mother; Diabetes in her mother; Mental illness in her sister; Other in her maternal grandfather; Stroke in her maternal aunt and maternal grandmother.   ROS:   Please see the history of present illness.    ROS All  other systems reviewed and are negative.   PHYSICAL EXAM:   VS:  Ht 5\' 2"  (1.575 m)   LMP 08/10/2010   BMI 37.68 kg/m    GEN: Well nourished, well developed, in no acute distress  HEENT: normal  Neck: no JVD, carotid bruits, or masses Cardiac: RRR; mild 2/6 systolic murmur, rubs, or gallops,no edema  Respiratory:  clear to auscultation bilaterally, normal work of breathing GI: soft, nontender, nondistended, + BS MS: no deformity or atrophy  Skin: warm and dry, no rash Neuro:  Alert and Oriented x 3, Strength and sensation are intact Psych: euthymic mood, full affect  Wt Readings from Last 3 Encounters:  09/08/17 206 lb (93.4 kg)  09/04/17 207 lb (93.9 kg)  08/28/17 202 lb (91.6 kg)    Studies/Labs Reviewed:   EKG:  EKG is not ordered today.  The ekg performed in ER showed sinus rhythm, left axis deviation, negative T waves in leads V3 and V4.  Poor R wave progression in the anterior leads.  Recent Labs: 07/30/2017: ALT 34; TSH 12.07 08/28/2017: Hemoglobin 16.8; Platelets 133 10/14/2017: BUN 13; Creatinine, Ser 0.96; Potassium 5.0; Sodium 144   Lipid Panel      Component Value Date/Time   CHOL 171 07/30/2017 0823   TRIG 187.0 (H) 07/30/2017 0823   HDL 41.90 07/30/2017 0823   CHOLHDL 4 07/30/2017 0823   VLDL 37.4 07/30/2017 0823   LDLCALC 92 07/30/2017 0823   LDLDIRECT 111.0 04/30/2017 1609      ASSESSMENT:    1. Other chest pain   2. Hyperlipidemia, unspecified hyperlipidemia type   3. Smoking      PLAN:  In order of problems listed above:  1. The patient underwent calcium score and coronary CTA with calcium score is 0 and no finding of coronary artery disease. She is advised to start exercising on a regular basis, and change her diet. She is advised that she doesn't need to take aspirin daily. 2. Hyperlipidemia - elevated triglycerides, however there is room for improvement in her diet and exercise, she is also advised to take over-the-counter fish oil. 3. Smoking - strongly advised to restart taking Wellbutrin and start taking nicotine patches.    Medication Adjustments/Labs and Tests Ordered: Current medicines are reviewed at length with the patient today.  Concerns regarding medicines are outlined above.  Medication changes, Labs and Tests ordered today are listed in the Patient Instructions below. Patient Instructions  Medication Instructions:   Your physician recommends that you continue on your current medications as directed. Please refer to the Current Medication list given to you today.     Follow-Up:  Your physician wants you to follow-up in: [place] will receive a reminder letter in the mail two months in advance. If you don't receive a letter, please call our office to schedule the follow-up appointment.        If you need a refill on your cardiac medications before your next appointment, please call your pharmacy.      Signed, Ena Dawley, MD  10/26/2017 9:15 AM    [facility] [address], [place], [place]  [address] Phone: [phone]; Fax: [phone]

## 2017-10-26 NOTE — Patient Instructions (Signed)

## 2017-12-09 ENCOUNTER — Other Ambulatory Visit: Payer: Self-pay | Admitting: Family Medicine

## 2018-01-28 ENCOUNTER — Encounter: Payer: Managed Care, Other (non HMO) | Admitting: Family Medicine

## 2018-06-17 ENCOUNTER — Other Ambulatory Visit: Payer: Self-pay | Admitting: *Deleted

## 2018-06-17 MED ORDER — LEVOTHYROXINE SODIUM 88 MCG PO TABS: 88.0000 ug | ORAL_TABLET | Freq: Every day | ORAL | 0 refills | Status: DC

## 2018-07-31 LAB — COLOGUARD

## 2018-08-05 ENCOUNTER — Telehealth: Payer: Self-pay | Admitting: *Deleted

## 2018-08-05 NOTE — Telephone Encounter (Signed)
Received Cologuard Order Cancellation: [id number]; order has been Cancelled because it has been Inactive, and has exceeded the 365 days from the Initial order; forwarded to provider/SLS 09/26

## 2018-09-13 ENCOUNTER — Other Ambulatory Visit: Payer: Self-pay | Admitting: Family Medicine

## 2018-09-14 ENCOUNTER — Ambulatory Visit: Payer: Managed Care, Other (non HMO) | Admitting: Family Medicine

## 2018-09-14 ENCOUNTER — Encounter: Payer: Self-pay | Admitting: Family Medicine

## 2018-09-14 VITALS — BP 102/62 | HR 66 | Temp 98.1°F | Resp 18 | Wt 214.2 lb

## 2018-09-14 DIAGNOSIS — E039 Hypothyroidism, unspecified: Secondary | ICD-10-CM | POA: Diagnosis not present

## 2018-09-14 DIAGNOSIS — R1013 Epigastric pain: Secondary | ICD-10-CM

## 2018-09-14 DIAGNOSIS — R739 Hyperglycemia, unspecified: Secondary | ICD-10-CM

## 2018-09-14 DIAGNOSIS — E785 Hyperlipidemia, unspecified: Secondary | ICD-10-CM | POA: Diagnosis not present

## 2018-09-14 DIAGNOSIS — K219 Gastro-esophageal reflux disease without esophagitis: Secondary | ICD-10-CM

## 2018-09-14 DIAGNOSIS — J029 Acute pharyngitis, unspecified: Secondary | ICD-10-CM | POA: Diagnosis not present

## 2018-09-14 DIAGNOSIS — N289 Disorder of kidney and ureter, unspecified: Secondary | ICD-10-CM

## 2018-09-14 MED ORDER — AMOXICILLIN 500 MG PO CAPS: 500.0000 mg | ORAL_CAPSULE | Freq: Three times a day (TID) | ORAL | 0 refills | Status: DC

## 2018-09-14 MED ORDER — FAMOTIDINE 40 MG PO TABS: 40.0000 mg | ORAL_TABLET | Freq: Every day | ORAL | 5 refills | Status: DC

## 2018-09-14 MED ORDER — NICOTINE 14 MG/24HR TD PT24: 14.0000 mg | MEDICATED_PATCH | Freq: Every day | TRANSDERMAL | 1 refills | Status: DC

## 2018-09-14 MED ORDER — NICOTINE 7 MG/24HR TD PT24: 7.0000 mg | MEDICATED_PATCH | Freq: Every day | TRANSDERMAL | 1 refills | Status: DC

## 2018-09-14 MED ORDER — IBUPROFEN 400 MG PO TABS: 400.0000 mg | ORAL_TABLET | Freq: Four times a day (QID) | ORAL | 1 refills | Status: DC | PRN

## 2018-09-14 NOTE — Assessment & Plan Note (Signed)
Check cmp 

## 2018-09-14 NOTE — Assessment & Plan Note (Signed)
Encouraged heart healthy diet, increase exercise, avoid trans fats, consider a krill oil cap daily 

## 2018-09-14 NOTE — Assessment & Plan Note (Signed)
hgba1c acceptable, minimize simple carbs. Increase exercise as tolerated.  

## 2018-09-14 NOTE — Patient Instructions (Signed)
Nicotine skin patches What is this medicine? NICOTINE ([place]) helps people stop smoking. The patches replace the nicotine found in cigarettes and help to decrease withdrawal effects. They are most effective when used in combination with a stop-smoking program. This medicine may be used for other purposes; ask your health care provider or pharmacist if you have questions. COMMON BRAND NAME(S): Habitrol, Nicoderm CQ, Nicotrol What should I tell my health care provider before I take this medicine? They need to know if you have any of these conditions: -diabetes -heart disease, angina, irregular heartbeat or previous heart attack -high blood pressure -lung disease, including asthma -overactive thyroid -pheochromocytoma -seizures or a history of seizures -skin problems, like eczema -stomach problems or ulcers -an unusual or allergic reaction to nicotine, adhesives, other medicines, foods, dyes, or preservatives -pregnant or trying to get pregnant -breast-feeding How should I use this medicine? This medicine is for use on the skin. Follow the directions that come with the patches. Find an area of skin on your upper arm, chest, or back that is clean, dry, greaseless, undamaged and hairless. Wash hands with plain soap and water. Do not use anything that contains aloe, lanolin or glycerin as these may prevent the patch from sticking. Dry thoroughly. Remove the patch from the sealed pouch. Do not try to cut or trim the patch. Using your palm, press the patch firmly in place for 10 seconds to make sure that there is good contact with your skin. After applying the patch, wash your hands. Change the patch every day, keeping to a regular schedule. When you apply a new patch, use a new area of skin. Wait at least 1 week before using the same area again. Talk to your pediatrician regarding the use of this medicine in children. Special care may be needed. Overdosage: If you think you have taken too much  of this medicine contact a poison control center or emergency room at once. NOTE: This medicine is only for you. Do not share this medicine with others. What if I miss a dose? If you forget to replace a patch, use it as soon as you can. Only use one patch at a time and do not leave on the skin for longer than directed. If a patch falls off, you can replace it, but keep to your schedule and remove the patch at the right time. What may interact with this medicine? -medicines for asthma -medicines for blood pressure -medicines for mental depression This list may not describe all possible interactions. Give your health care provider a list of all the medicines, herbs, non-prescription drugs, or dietary supplements you use. Also tell them if you smoke, drink alcohol, or use illegal drugs. Some items may interact with your medicine. What should I watch for while using this medicine? You should begin using the nicotine patch the day you stop smoking. It is okay if you do not succeed at your attempt to quit and have a cigarette. You can still continue your quit attempt and keep using the product as directed. Just throw away your cigarettes and get back to your quit plan. You can keep the patch in place during swimming, bathing, and showering. If your patch falls off during these activities, replace it. When you first apply the patch, your skin may itch or burn. This should go away soon. When you remove a patch, the skin may look red, but this should only last for a few days. Call your doctor or health care professional  if skin redness does not go away after 4 days, if your skin swells, or if you get a rash. If you are a diabetic and you quit smoking, the effects of insulin may be increased and you may need to reduce your insulin dose. Check with your doctor or health care professional about how you should adjust your insulin dose. If you are going to have a magnetic resonance imaging (MRI) procedure, tell your  MRI technician if you have this patch on your body. It must be removed before a MRI. What side effects may I notice from receiving this medicine? Side effects that you should report to your doctor or health care professional as soon as possible: -allergic reactions like skin rash, itching or hives, swelling of the face, lips, or tongue -breathing problems -changes in hearing -changes in vision -chest pain -cold sweats -confusion -fast, irregular heartbeat -feeling faint or lightheaded, falls -headache -increased saliva -skin redness that lasts more than 4 days -stomach pain -signs and symptoms of nicotine overdose like nausea; vomiting; dizziness; weakness; and rapid heartbeat Side effects that usually do not require medical attention (report to your doctor or health care professional if they continue or are bothersome): -diarrhea -dry mouth -hiccups -irritability -nervousness or restlessness -trouble sleeping or vivid dreams This list may not describe all possible side effects. Call your doctor for medical advice about side effects. You may report side effects to FDA at 1-800-FDA-1088. Where should I keep my medicine? Keep out of the reach of children. Store at room temperature between 20 and 25 degrees C (68 and 77 degrees F). Protect from heat and light. Store in International aid/development worker until ready to use. Throw away unused medicine after the expiration date. When you remove a patch, fold with sticky sides together; put in an empty opened pouch and throw away. NOTE: This sheet is a summary. It may not cover all possible information. If you have questions about this medicine, talk to your doctor, pharmacist, or health care provider.  2018 Elsevier/Gold Standard (2014-09-25 15:46:21)

## 2018-09-14 NOTE — Assessment & Plan Note (Signed)
On Levothyroxine, continue to monitor 

## 2018-09-14 NOTE — Assessment & Plan Note (Addendum)
Start Amoxicillin if no relief then call for referral to ENT

## 2018-09-15 DIAGNOSIS — K219 Gastro-esophageal reflux disease without esophagitis: Secondary | ICD-10-CM | POA: Insufficient documentation

## 2018-09-15 LAB — LIPID PANEL
Cholesterol: 183 mg/dL (ref 0–200)
HDL: 37.5 mg/dL — AB (ref >39.00)
NonHDL: 145.79
TRIGLYCERIDES: 232 mg/dL — AB (ref 0.0–149.0)
Total CHOL/HDL Ratio: 5
VLDL: 46.4 mg/dL — ABNORMAL HIGH (ref 0.0–40.0)

## 2018-09-15 LAB — CBC
HEMATOCRIT: 44.4 % (ref 36.0–46.0)
HEMOGLOBIN: 15.3 g/dL — AB (ref 12.0–15.0)
MCHC: 34.5 g/dL (ref 30.0–36.0)
MCV: 94 fl (ref 78.0–100.0)
Platelets: 129 10*3/uL — ABNORMAL LOW (ref 150.0–400.0)
RBC: 4.72 Mil/uL (ref 3.87–5.11)
RDW: 13 % (ref 11.5–15.5)
WBC: 7.3 10*3/uL (ref 4.0–10.5)

## 2018-09-15 LAB — HEMOGLOBIN A1C: HEMOGLOBIN A1C: 5.8 % (ref 4.6–6.5)

## 2018-09-15 LAB — COMPREHENSIVE METABOLIC PANEL
ALK PHOS: 57 U/L (ref 39–117)
ALT: 34 U/L (ref 0–35)
AST: 25 U/L (ref 0–37)
Albumin: 4.1 g/dL (ref 3.5–5.2)
BUN: 12 mg/dL (ref 6–23)
CO2: 30 meq/L (ref 19–32)
Calcium: 9.6 mg/dL (ref 8.4–10.5)
Chloride: 105 mEq/L (ref 96–112)
Creatinine, Ser: 1.04 mg/dL (ref 0.40–1.20)
GFR: 59.11 mL/min — AB (ref >60.00)
Glucose, Bld: 94 mg/dL (ref 70–99)
POTASSIUM: 4.3 meq/L (ref 3.5–5.1)
SODIUM: 142 meq/L (ref 135–145)
Total Bilirubin: 0.4 mg/dL (ref 0.2–1.2)
Total Protein: 6.5 g/dL (ref 6.0–8.3)

## 2018-09-15 LAB — TSH: TSH: 2.14 u[IU]/mL (ref 0.35–4.50)

## 2018-09-15 LAB — LDL CHOLESTEROL, DIRECT: Direct LDL: 136 mg/dL

## 2018-09-15 LAB — H. PYLORI ANTIBODY, IGG: H PYLORI IGG: NEGATIVE

## 2018-09-15 NOTE — Assessment & Plan Note (Signed)
Avoid offending foods, start probiotics. Do not eat large meals in late evening and consider raising head of bed. Start Famotidine 

## 2018-09-15 NOTE — Progress Notes (Signed)
Subjective:    Patient ID: Peggy Thomas, female    DOB: 03/26/1966, 52 y.o.   MRN: [id number]  No chief complaint on file.   HPI Patient is in today for follow-up and she has not been in for a while.  She notes intermittent trouble with sore throat this past year.  Is frustrated with her weight gain and does believe heartburn is gotten worse since then.  She has frequent episodes of dyspepsia.  No recent febrile illness or acute hospitalizations.  No dysphasia or hemoptysis. Well controlled, no changes to meds. Encouraged heart healthy diet such as the DASH diet and exercise as tolerated.   Past Medical History:  Diagnosis Date  . Allergic state 05/01/2017  . Anemia 03/31/2015  . Aneurysm ([place])    on spleen-watching   . Chronic idiopathic thrombocytopenia (H[facility])    MILD--   HEMATOLOGIST--  DR Marin Olp  . Depression 04/30/2011  . History of kidney stones   . History of tobacco abuse 04/30/2011  . Hyperglycemia 07/30/2017  . Hyperlipidemia 07/30/2017  . Hypocalcemia   . Hypothyroidism 04/30/2011  . Nausea without vomiting 03/26/2015  . Obesity   . PONV (postoperative nausea and vomiting)   . Renal insufficiency   . Right ureteral stone   . Sinusitis 04/30/2017    Past Surgical History:  Procedure Laterality Date  . ABDOMINAL HYSTERECTOMY    . [place]  . CHOLECYSTECTOMY  2000  . CYSTOSCOPY WITH RETROGRADE PYELOGRAM, URETEROSCOPY AND STENT PLACEMENT Right 07/25/2013   Procedure: CYSTOSCOPY WITH RETROGRADE PYELOGRAM, URETEROSCOPY, STONE EXTRACTION AND POSSIBLE STENT PLACEMENT;  Surgeon: Ailene Rud, MD;  Location: [facility];  Service: Urology;  Laterality: Right;  . EMBOLIZATION N/A 12/26/2014   Procedure: EMBOLIZATION;  Surgeon: Serafina Mitchell, MD;  Location: [facility] CATH LAB;  Service: Cardiovascular;  Laterality: N/A;  . EXTRACORPOREAL SHOCK WAVE LITHOTRIPSY  2003  . KNEE ARTHROSCOPY    . PERIPHERAL VASCULAR CATHETERIZATION  12/26/2014     Procedure: VISCERAL ANGIOGRAPHY;  Surgeon: Serafina Mitchell, MD;  Location[facility] C[facility] LAB;  Service: Cardiovascular;;  . ROBOTIC ASSISTED TOTAL HYSTERECTOMY  10-15-2010   LAPAROSCOPIC /  LYSIS ADHESIONS  . TUBAL LIGATION  1999  . VIDEO ASSISTED THORACOSCOPY (VATS)/EMPYEMA Left 02/06/2015   Procedure: LEFT VIDEO ASSISTED THORACOSCOPY (VATS)/DRAINAGE OF EMPYEMA;  Surgeon: Grace Isaac, MD;  Location: [place];  Service: Thoracic;  Laterality: Left;  (L)VATS, DRAINAGE OF EMPYEMA  . VIDEO BRONCHOSCOPY N/A 02/06/2015   Procedure: VIDEO BRONCHOSCOPY;  Surgeon: Grace Isaac, MD;  Location[facility];  Service: Thoracic;  Laterality: N/A;    Family History  Problem Relation Age of Onset  . Diabetes Mother        type 2  . Asthma Mother   . Cancer Mother        skin cancer  . Cancer Father        bone  . Mental illness Sister        schizophrenia  . Alcohol abuse Maternal Grandmother   . Stroke Maternal Grandmother   . Aneurysm Maternal Grandmother        brain aneurysm  . Other Maternal Grandfather        black lung  . Alzheimer's disease Paternal Grandmother   . Stroke Maternal Aunt     Social History   Socioeconomic History  . Marital status: Married    Spouse name: Not on file  . Number of children: Not on  file  . Years of education: Not on file  . Highest education level: Not on file  Occupational History  . Not on file  Social Needs  . Financial resource strain: Not on file  . Food insecurity:    Worry: Not on file    Inability: Not on file  . Transportation needs:    Medical: Not on file    Non-medical: Not on file  Tobacco Use  . Smoking status: Current Every Day Smoker    Packs/day: 0.75    Years: 30.00    Pack years: 22.50    Types: Cigarettes  . Smokeless tobacco: Never Used  Substance and Sexual Activity  . Alcohol use: Yes    Alcohol/week: 0.0 standard drinks    Comment: occasionally/socially  . Drug use: No  . Sexual activity: Yes    Partners: Male     Birth control/protection: Surgical    Comment: TLH/LOA  Lifestyle  . Physical activity:    Days per week: Not on file    Minutes per session: Not on file  . Stress: Not on file  Relationships  . Social connections:    Talks on phone: Not on file    Gets together: Not on file    Attends religious service: Not on file    Active member of club or organization: Not on file    Attends meetings of clubs or organizations: Not on file    Relationship status: Not on file  . Intimate partner violence:    Fear of current or ex partner: Not on file    Emotionally abused: Not on file    Physically abused: Not on file    Forced sexual activity: Not on file  Other Topics Concern  . Not on file  Social History Narrative  . Not on file    Outpatient Medications Prior to Visit  Medication Sig Dispense Refill  . acetaminophen (TYLENOL) 500 MG tablet Take 1 tablet (500 mg total) by mouth every 6 (six) hours as needed for moderate pain. 30 tablet 0  . aspirin EC 81 MG tablet Take 1 tablet (81 mg total) by mouth daily.    Marland Kitchen b complex vitamins tablet Take 1 tablet by mouth as needed (for energy).     Marland Kitchen levothyroxine (SYNTHROID, LEVOTHROID) 88 MCG tablet Take 1 tablet (88 mcg total) by mouth daily. Needs ov before any more refills 90 tablet 0  . nitroGLYCERIN (NITROSTAT) 0.4 MG SL tablet Place 1 tablet (0.4 mg total) under the tongue every 5 (five) minutes as needed for chest pain. 25 tablet 1  . traMADol (ULTRAM) 50 MG tablet TAKE 1-2 TALETS BY MOUTH EVERY 6 HOURS AS NEEDED FOR MODERATE PAIN 60 tablet 0  . nicotine (NICODERM CQ - DOSED IN MG/24 HOURS) 14 mg/24hr patch Place 1 patch (14 mg total) onto the skin daily. (Patient not taking: Reported on 10/21/2017) 30 patch 1  . nicotine (NICODERM CQ - DOSED IN MG/24 HR) 7 mg/24hr patch Place 1 patch (7 mg total) onto the skin daily. (Patient not taking: Reported on 10/21/2017) 30 patch 1   No facility-administered medications prior to visit.     No  Known Allergies  Review of Systems  Constitutional: Negative for fever and malaise/fatigue.  HENT: Positive for sore throat. Negative for congestion.   Eyes: Negative for blurred vision.  Respiratory: Negative for shortness of breath.   Cardiovascular: Negative for chest pain, palpitations and leg swelling.  Gastrointestinal: Positive for heartburn. Negative for abdominal  pain, blood in stool and nausea.  Genitourinary: Negative for dysuria and frequency.  Musculoskeletal: Negative for falls.  Skin: Negative for rash.  Neurological: Negative for dizziness, loss of consciousness and headaches.  Endo/Heme/Allergies: Negative for environmental allergies.  Psychiatric/Behavioral: Negative for depression. The patient is not nervous/anxious.        Objective:    Physical Exam  Constitutional: She is oriented to person, place, and time. She appears well-developed and well-nourished. No distress.  HENT:  Head: Normocephalic and atraumatic.  Nose: Nose normal.  Oropharynx is erythematous.   Eyes: Right eye exhibits no discharge. Left eye exhibits no discharge.  Neck: Normal range of motion. Neck supple.  Cardiovascular: Normal rate and regular rhythm.  No murmur heard. Pulmonary/Chest: Effort normal and breath sounds normal.  Abdominal: Soft. Bowel sounds are normal. There is no tenderness.  Musculoskeletal: She exhibits no edema.  Neurological: She is alert and oriented to person, place, and time.  Skin: Skin is warm and dry.  Psychiatric: She has a normal mood and affect.  Nursing note and vitals reviewed.   BP 102/62 (BP Location: Left Arm, Patient Position: Sitting, Cuff Size: Normal)   Pulse 66   Temp 98.1 F (36.7 C) (Oral)   Resp 18   Wt 214 lb 3.2 oz (97.2 kg)   LMP 08/10/2010   SpO2 98%   BMI 39.18 kg/m  Wt Readings from Last 3 Encounters:  09/14/18 214 lb 3.2 oz (97.2 kg)  09/08/17 206 lb (93.4 kg)  09/04/17 207 lb (93.9 kg)     Lab Results  Component Value  Date   WBC 7.3 09/14/2018   HGB 15.3 (H) 09/14/2018   HCT 44.4 09/14/2018   PLT 129.0 (L) 09/14/2018   GLUCOSE 94 09/14/2018   CHOL 183 09/14/2018   TRIG 232.0 (H) 09/14/2018   HDL 37.50 (L) 09/14/2018   LDLDIRECT 136.0 09/14/2018   LDLCALC 92 07/30/2017   ALT 34 09/14/2018   AST 25 09/14/2018   NA 142 09/14/2018   K 4.3 09/14/2018   CL 105 09/14/2018   CREATININE 1.04 09/14/2018   BUN 12 09/14/2018   CO2 30 09/14/2018   TSH 2.14 09/14/2018   INR 1.01 05/04/2017   HGBA1C 5.8 09/14/2018    Lab Results  Component Value Date   TSH 2.14 09/14/2018   Lab Results  Component Value Date   WBC 7.3 09/14/2018   HGB 15.3 (H) 09/14/2018   HCT 44.4 09/14/2018   MCV 94.0 09/14/2018   PLT 129.0 (L) 09/14/2018   Lab Results  Component Value Date   NA 142 09/14/2018   K 4.3 09/14/2018   CO2 30 09/14/2018   GLUCOSE 94 09/14/2018   BUN 12 09/14/2018   CREATININE 1.04 09/14/2018   BILITOT 0.4 09/14/2018   ALKPHOS 57 09/14/2018   AST 25 09/14/2018   ALT 34 09/14/2018   PROT 6.5 09/14/2018   ALBUMIN 4.1 09/14/2018   CALCIUM 9.6 09/14/2018   ANIONGAP 9 08/28/2017   GFR 59.11 (L) 09/14/2018   Lab Results  Component Value Date   CHOL 183 09/14/2018   Lab Results  Component Value Date   HDL 37.50 (L) 09/14/2018   Lab Results  Component Value Date   LDLCALC 92 07/30/2017   Lab Results  Component Value Date   TRIG 232.0 (H) 09/14/2018   Lab Results  Component Value Date   CHOLHDL 5 09/14/2018   Lab Results  Component Value Date   HGBA1C 5.8 09/14/2018  Assessment & Plan:   Problem List Items Addressed This Visit    Renal insufficiency    Check cmp       Relevant Orders   Comprehensive metabolic panel (Completed)   Hypothyroidism    On Levothyroxine, continue to monitor      Relevant Orders   TSH (Completed)   Hyperglycemia    hgba1c acceptable, minimize simple carbs. Increase exercise as tolerated.       Relevant Orders   Hemoglobin A1c  (Completed)   Hyperlipidemia    Encouraged heart healthy diet, increase exercise, avoid trans fats, consider a krill oil cap daily      Relevant Orders   Lipid panel (Completed)   Pharyngitis    Start Amoxicillin if no relief then call for referral to ENT      Relevant Orders   CBC (Completed)   Acid reflux    Avoid offending foods, start probiotics. Do not eat large meals in late evening and consider raising head of bed. Start Famotidine.       Relevant Medications   famotidine (PEPCID) 40 MG tablet    Other Visit Diagnoses    Dyspepsia    -  Primary   Relevant Orders   H. pylori antibody, IgG (Completed)      I am having Sharyn Lull D. Tobey start on ibuprofen, amoxicillin, and famotidine. I am also having her maintain her b complex vitamins, acetaminophen, traMADol, nitroGLYCERIN, aspirin EC, levothyroxine, nicotine, and nicotine.  Meds ordered this encounter  Medications  . ibuprofen (ADVIL,MOTRIN) 400 MG tablet    Sig: Take 1 tablet (400 mg total) by mouth every 6 (six) hours as needed.    Dispense:  90 tablet    Refill:  1  . amoxicillin (AMOXIL) 500 MG capsule    Sig: Take 1 capsule (500 mg total) by mouth 3 (three) times daily.    Dispense:  30 capsule    Refill:  0  . nicotine (NICODERM CQ - DOSED IN MG/24 HOURS) 14 mg/24hr patch    Sig: Place 1 patch (14 mg total) onto the skin daily.    Dispense:  30 patch    Refill:  1  . nicotine (NICODERM CQ - DOSED IN MG/24 HR) 7 mg/24hr patch    Sig: Place 1 patch (7 mg total) onto the skin daily.    Dispense:  30 patch    Refill:  1  . famotidine (PEPCID) 40 MG tablet    Sig: Take 1 tablet (40 mg total) by mouth at bedtime.    Dispense:  30 tablet    Refill:  5     Penni Homans, MD

## 2018-09-27 ENCOUNTER — Other Ambulatory Visit: Payer: Self-pay | Admitting: Family Medicine

## 2018-12-21 ENCOUNTER — Other Ambulatory Visit: Payer: Self-pay | Admitting: Family Medicine

## 2018-12-22 ENCOUNTER — Ambulatory Visit: Payer: Managed Care, Other (non HMO) | Admitting: Medical

## 2018-12-22 ENCOUNTER — Encounter: Payer: Self-pay | Admitting: Medical

## 2018-12-22 VITALS — BP 110/81 | HR 97 | Temp 98.8°F | Resp 16 | Ht 62.0 in | Wt 205.2 lb

## 2018-12-22 DIAGNOSIS — R062 Wheezing: Secondary | ICD-10-CM | POA: Diagnosis not present

## 2018-12-22 DIAGNOSIS — R059 Cough, unspecified: Secondary | ICD-10-CM

## 2018-12-22 DIAGNOSIS — R05 Cough: Secondary | ICD-10-CM | POA: Diagnosis not present

## 2018-12-22 DIAGNOSIS — M791 Myalgia, unspecified site: Secondary | ICD-10-CM

## 2018-12-22 DIAGNOSIS — J101 Influenza due to other identified influenza virus with other respiratory manifestations: Secondary | ICD-10-CM | POA: Diagnosis not present

## 2018-12-22 MED ORDER — BENZONATATE 100 MG PO CAPS: 100.0000 mg | ORAL_CAPSULE | Freq: Three times a day (TID) | ORAL | 0 refills | Status: DC | PRN

## 2018-12-22 MED ORDER — ALBUTEROL SULFATE HFA 108 (90 BASE) MCG/ACT IN AERS: 2.0000 | INHALATION_SPRAY | Freq: Four times a day (QID) | RESPIRATORY_TRACT | 2 refills | Status: DC | PRN

## 2018-12-22 MED ORDER — FLUTICASONE PROPIONATE 50 MCG/ACT NA SUSP: 2.0000 | Freq: Every day | NASAL | 1 refills | Status: DC

## 2018-12-22 MED ORDER — OSELTAMIVIR PHOSPHATE 75 MG PO CAPS: 75.0000 mg | ORAL_CAPSULE | Freq: Two times a day (BID) | ORAL | 0 refills | Status: DC

## 2018-12-22 MED ORDER — AZITHROMYCIN 250 MG PO TABS: ORAL_TABLET | ORAL | 0 refills | Status: DC

## 2018-12-22 NOTE — Progress Notes (Signed)
Subjective:    Patient ID: Peggy Thomas, female    DOB: 17-Oct-1966, 53 y.o.   MRN: [id number]  HPI  Pt in with recent fever, chills, and body aches. These symptoms started on Sunday afternoon. Pt got dry cough on Monday. No sinus pain. Pt still feeling achy and tired. Her fever seems to be coming down.   Pt did get the flu vaccine this year.  Pt husband is kidney transplant patient.  Pt states occasionally she will get sinus infection and bronchitis. Pt is a smoker.    Review of Systems  Constitutional: Positive for fatigue and fever. Negative for chills.  Respiratory: Positive for cough and wheezing. Negative for chest tightness and shortness of breath.   Cardiovascular: Negative for chest pain and palpitations.  Gastrointestinal: Negative for abdominal distention, abdominal pain and constipation.  Musculoskeletal: Positive for myalgias.  Skin: Negative for pallor.  Neurological: Negative for dizziness, tremors, speech difficulty, weakness, numbness and headaches.  Hematological: Negative for adenopathy. Does not bruise/bleed easily.  Psychiatric/Behavioral: Negative for behavioral problems, confusion, dysphoric mood, sleep disturbance and suicidal ideas. The patient is not nervous/anxious.     Past Medical History:  Diagnosis Date  . Allergic state 05/01/2017  . Anemia 03/31/2015  . Aneurysm ([place])    on spleen-watching   . Chronic idiopathic thrombocytopenia (HCC)    MILD--   HEMATOLOGIST--  DR Marin Olp  . Depression 04/30/2011  . History of kidney stones   . History of tobacco abuse 04/30/2011  . Hyperglycemia 07/30/2017  . Hyperlipidemia 07/30/2017  . Hypocalcemia   . Hypothyroidism 04/30/2011  . Nausea without vomiting 03/26/2015  . Obesity   . PONV (postoperative nausea and vomiting)   . Renal insufficiency   . Right ureteral stone   . Sinusitis 04/30/2017     Social History   Socioeconomic History  . Marital status: Married    Spouse name: Not on file  .  Number of children: Not on file  . Years of education: Not on file  . Highest education level: Not on file  Occupational History  . Not on file  Social Needs  . Financial resource strain: Not on file  . Food insecurity:    Worry: Not on file    Inability: Not on file  . Transportation needs:    Medical: Not on file    Non-medical: Not on file  Tobacco Use  . Smoking status: Current Every Day Smoker    Packs/day: 0.75    Years: 30.00    Pack years: 22.50    Types: Cigarettes  . Smokeless tobacco: Never Used  Substance and Sexual Activity  . Alcohol use: Yes    Alcohol/week: 0.0 standard drinks    Comment: occasionally/socially  . Drug use: No  . Sexual activity: Yes    Partners: Male    Birth control/protection: Surgical    Comment: TLH/LOA  Lifestyle  . Physical activity:    Days per week: Not on file    Minutes per session: Not on file  . Stress: Not on file  Relationships  . Social connections:    Talks on phone: Not on file    Gets together: Not on file    Attends religious service: Not on file    Active member of club or organization: Not on file    Attends meetings of clubs or organizations: Not on file    Relationship status: Not on file  . Intimate partner violence:    Fear of current  or ex partner: Not on file    Emotionally abused: Not on file    Physically abused: Not on file    Forced sexual activity: Not on file  Other Topics Concern  . Not on file  Social History Narrative  . Not on file    Past Surgical History:  Procedure Laterality Date  . ABDOMINAL HYSTERECTOMY    . [place]  . CHOLECYSTECTOMY  2000  . CYSTOSCOPY WITH RETROGRADE PYELOGRAM, URETEROSCOPY AND STENT PLACEMENT Right 07/25/2013   Procedure: CYSTOSCOPY WITH RETROGRADE PYELOGRAM, URETEROSCOPY, STONE EXTRACTION AND POSSIBLE STENT PLACEMENT;  Surgeon: Ailene Rud, MD;  Location: [facility];  Service: Urology;  Laterality: Right;  .  EMBOLIZATION N/A 12/26/2014   Procedure: EMBOLIZATION;  Surgeon: Serafina Mitchell, MD;  Location: [facility] CATH LAB;  Service: Cardiovascular;  Laterality: N/A;  . EXTRACORPOREAL SHOCK WAVE LITHOTRIPSY  2003  . KNEE ARTHROSCOPY    . PERIPHERAL VASCULAR CATHETERIZATION  12/26/2014   Procedure: VISCERAL ANGIOGRAPHY;  Surgeon: Serafina Mitchell, MD;  Location: [facility] CATH LAB;  Service: Cardiovascular;;  . ROBOTIC ASSISTED TOTAL HYSTERECTOMY  10-15-2010   LAPAROSCOPIC /  LYSIS ADHESIONS  . TUBAL LIGATION  1999  . VIDEO ASSISTED THORACOSCOPY (VATS)/EMPYEMA Left 02/06/2015   Procedure: LEFT VIDEO ASSISTED THORACOSCOPY (VATS)/DRAINAGE OF EMPYEMA;  Surgeon: Grace Isaac, MD;  Location: [facility];  Service: Thoracic;  Laterality: Left;  (L)VATS, DRAINAGE OF EMPYEMA  . VIDEO BRONCHOSCOPY N/A 02/06/2015   Procedure: VIDEO BRONCHOSCOPY;  Surgeon: Grace Isaac, MD;  Location: [facility] OR;  Service: Thoracic;  Laterality: N/A;    Family History  Problem Relation Age of Onset  . Diabetes Mother        type 2  . Asthma Mother   . Cancer Mother        skin cancer  . Cancer Father        bone  . Mental illness Sister        schizophrenia  . Alcohol abuse Maternal Grandmother   . Stroke Maternal Grandmother   . Aneurysm Maternal Grandmother        brain aneurysm  . Other Maternal Grandfather        black lung  . Alzheimer's disease Paternal Grandmother   . Stroke Maternal Aunt     No Known Allergies  Current Outpatient Medications on File Prior to Visit  Medication Sig Dispense Refill  . acetaminophen (TYLENOL) 500 MG tablet Take 1 tablet (500 mg total) by mouth every 6 (six) hours as needed for moderate pain. 30 tablet 0  . aspirin EC 81 MG tablet Take 1 tablet (81 mg total) by mouth daily.    Marland Kitchen b complex vitamins tablet Take 1 tablet by mouth as needed (for energy).     . famotidine (PEPCID) 40 MG tablet Take 1 tablet (40 mg total) by mouth at bedtime. 30 tablet 5  . ibuprofen (ADVIL,MOTRIN) 400 MG tablet  Take 1 tablet (400 mg total) by mouth every 6 (six) hours as needed. 90 tablet 1  . levothyroxine (SYNTHROID, LEVOTHROID) 88 MCG tablet Take 1 tablet (88 mcg total) by mouth daily before breakfast. 90 tablet 1  . nicotine (NICODERM CQ - DOSED IN MG/24 HOURS) 14 mg/24hr patch Place 1 patch (14 mg total) onto the skin daily. 30 patch 1  . nicotine (NICODERM CQ - DOSED IN MG/24 HR) 7 mg/24hr patch Place 1 patch (7 mg total) onto the skin daily. 30 patch 1  .  nitroGLYCERIN (NITROSTAT) 0.4 MG SL tablet Place 1 tablet (0.4 mg total) under the tongue every 5 (five) minutes as needed for chest pain. 25 tablet 1  . traMADol (ULTRAM) 50 MG tablet TAKE 1-2 TALETS BY MOUTH EVERY 6 HOURS AS NEEDED FOR MODERATE PAIN 60 tablet 0   No current facility-administered medications on file prior to visit.     BP 110/81   Pulse 97   Temp 98.8 F (37.1 C) (Oral)   Resp 16   Ht 5\' 2"  (1.575 m)   Wt 205 lb 3.2 oz (93.1 kg)   LMP 08/10/2010   SpO2 98%   BMI 37.53 kg/m       Objective:   Physical Exam  General  Mental Status - Alert. General Appearance - Well groomed. Not in acute distress.  Skin Rashes- No Rashes.  HEENT Head- Normal. Ear Auditory Canal - Left- Normal. Right - Normal.Tympanic Membrane- Left- Normal. Right- Normal. Eye Sclera/Conjunctiva- Left- Normal. Right- Normal. Nose & Sinuses Nasal Mucosa- Left-  Boggy and Congested. Right-  Boggy and  Congested.Bilateral maxillary and frontal sinus pressure. Mouth & Throat Lips: Upper Lip- Normal: no dryness, cracking, pallor, cyanosis, or vesicular eruption. Lower Lip-Normal: no dryness, cracking, pallor, cyanosis or vesicular eruption. Buccal Mucosa- Bilateral- No Aphthous ulcers. Oropharynx- No Discharge or Erythema. Tonsils: Characteristics- Bilateral- No Erythema or Congestion. Size/Enlargement- Bilateral- No enlargement. Discharge- bilateral-None.  Neck Neck- Supple. No Masses.   Chest and Lung Exam Auscultation: Breath  Sounds:-Clear even and unlabored.  Cardiovascular Auscultation:Rythm- Regular, rate and rhythm. Murmurs & Other Heart Sounds:Ausculatation of the heart reveal- No Murmurs.  Lymphatic Head & Neck General Head & Neck Lymphatics: Bilateral: Description- No Localized lymphadenopathy.       Assessment & Plan:  You do have type A flu.  You might have less severe course of illness since you have had vaccine.  However you do smoke and you may get secondary bacterial infection.  No obvious bacterial infection presently.  I want you to rest and hydrate.  Can use low-dose ibuprofen for body aches.  For cough, I am prescribing benzonatate. For nasal congestion, I am prescribing Flonase.  For wheezing, making albuterol inhaler available.  If you get secondary signs symptoms of infection as described then start a azithromycin.  This is provided as a print prescription and only making it available as needed.  Follow-up in 7 days or sooner if needed.  If any severe signs symptoms change after hours/over the weekend then recommend ED evaluation.  Mackie Pai, PA-C

## 2018-12-22 NOTE — Patient Instructions (Addendum)
You do have type A flu.  You might have less severe course of illness since you have had vaccine.  However you do smoke and you may get secondary bacterial infection.  No obvious bacterial infection presently.  I want you to rest and hydrate.  Can use low-dose ibuprofen for body aches.  For cough, I am prescribing benzonatate. For nasal congestion, I am prescribing Flonase.  For wheezing, making albuterol inhaler available.  If you get secondary signs symptoms of infection as described then start a azithromycin.  This is provided as a print prescription and only making it available as needed.  Follow-up in 7 days or sooner if needed.  If any severe signs symptoms change after hours/over the weekend then recommend ED evaluation.

## 2019-01-24 ENCOUNTER — Encounter: Payer: Managed Care, Other (non HMO) | Admitting: Family Medicine

## 2019-02-23 ENCOUNTER — Other Ambulatory Visit: Payer: Self-pay | Admitting: Medical

## 2019-02-24 ENCOUNTER — Encounter: Payer: Managed Care, Other (non HMO) | Admitting: Family Medicine

## 2019-06-07 ENCOUNTER — Encounter: Payer: Managed Care, Other (non HMO) | Admitting: Family Medicine

## 2019-06-15 ENCOUNTER — Other Ambulatory Visit: Payer: Self-pay | Admitting: Family Medicine

## 2019-11-28 ENCOUNTER — Other Ambulatory Visit: Payer: Self-pay | Admitting: Family Medicine

## 2020-01-04 ENCOUNTER — Ambulatory Visit (HOSPITAL_BASED_OUTPATIENT_CLINIC_OR_DEPARTMENT_OTHER)
Admission: RE | Admit: 2020-01-04 | Discharge: 2020-01-04 | Disposition: A | Discharge status: Home / Self Care | Payer: Managed Care, Other (non HMO) | Source: Ambulatory Visit | Attending: Medical | Admitting: Medical

## 2020-01-04 ENCOUNTER — Ambulatory Visit: Payer: Managed Care, Other (non HMO) | Admitting: Medical

## 2020-01-04 ENCOUNTER — Other Ambulatory Visit: Payer: Self-pay

## 2020-01-04 VITALS — BP 118/68 | HR 75 | Temp 97.1°F | Resp 18 | Ht 62.0 in | Wt 206.4 lb

## 2020-01-04 DIAGNOSIS — W57XXXA Bitten or stung by nonvenomous insect and other nonvenomous arthropods, initial encounter: Secondary | ICD-10-CM

## 2020-01-04 DIAGNOSIS — M542 Cervicalgia: Secondary | ICD-10-CM | POA: Insufficient documentation

## 2020-01-04 DIAGNOSIS — S46819A Strain of other muscles, fascia and tendons at shoulder and upper arm level, unspecified arm, initial encounter: Secondary | ICD-10-CM | POA: Diagnosis not present

## 2020-01-04 DIAGNOSIS — S20362A Insect bite (nonvenomous) of left front wall of thorax, initial encounter: Secondary | ICD-10-CM | POA: Diagnosis not present

## 2020-01-04 LAB — SEDIMENTATION RATE: Sed Rate: 17 mm/hr (ref 0–30)

## 2020-01-04 MED ORDER — KETOROLAC TROMETHAMINE 60 MG/2ML IM SOLN
60.0000 mg | Freq: Once | INTRAMUSCULAR | Status: AC
  Administered 2020-01-04: 11:00:00 60 mg via INTRAMUSCULAR

## 2020-01-04 MED ORDER — DICLOFENAC SODIUM 75 MG PO TBEC: 75.0000 mg | DELAYED_RELEASE_TABLET | Freq: Two times a day (BID) | ORAL | 0 refills | Status: DC

## 2020-01-04 MED ORDER — CYCLOBENZAPRINE HCL 10 MG PO TABS: 10.0000 mg | ORAL_TABLET | Freq: Every day | ORAL | 0 refills | Status: DC

## 2020-01-04 NOTE — Patient Instructions (Signed)
For your neck pain and trapezius muscle are pain, will get xray of your cervical spine. We gave toradol 60 mg im today. Will rx diclofenac to start tomorrow and rx flexeril muscle relaxant to use at night.   Also for hx of tick bite will get tick bite studies and follow those.  Will assess response to treatment and if studies negative and symptoms persist then consider referral to sport med MD or PT.  Follow up 7 days or as needed

## 2020-01-04 NOTE — Progress Notes (Signed)
   Subjective:    Patient ID: Peggy Thomas, female    DOB: [birth year], 54 y.o.   MRN: [id number]  HPI  Pt in for some neck pain on both sides. She states pain in occipital area. Pt states felt like a kink in her neck like she slept in poor positions. Pt states some tingling to left upper upper ext 2 years ago. She went to ED and had negative work up.  Pt states stiff neck in morning and then eases up. Pt tried ibuprofen for pain. Ibuprofen seems to help some. Massage gun seems help briefly.   Pt does have hx of tmj.     Pt states pain started around October. She does remember July or august having a tick on her left rib area. Husband pulled tick off. Red area present for 3 weeks but no bulls eye.   Review of Systems     Objective:   Physical Exam  General Mental Status- Alert. General Appearance- Not in acute distress.   Skin General: Color- Normal Color. Moisture- Normal Moisture.  Neck Carotid Arteries- Normal color. Moisture- Normal Moisture. No carotid bruits. No JVD. Bilateral upper trapezius tenderness to palpation/occiptal tenderness. Mild mid cspine pain. Good rom.   Chest and Lung Exam Auscultation: Breath Sounds:-Normal.  Cardiovascular Auscultation:Rythm- Regular. Murmurs & Other Heart Sounds:Auscultation of the heart reveals- No Murmurs.  Abdomen Inspection:-Inspeection Normal. Palpation/Percussion:Note:No mass. Palpation and Percussion of the abdomen reveal- Non Tender, Non Distended + BS, no rebound or guarding.    Neurologic Cranial Nerve exam:- CN III-XII intact(No nystagmus), symmetric smile. Drift Test:- No drift. Romberg Exam:- Negative.  Heal to Toe Gait exam:-Normal. Finger to Nose:- Normal/Intact Strength:- 5/5 equal and symmetric strength both upper and lower extremities.      Assessment & Plan:  For your neck pain and trapezius muscle are pain, will get xray of your cervical spine. We gave toradol 60 mg im today. Will rx diclofenac to  start tomorrow and rx flexeril muscle relaxant to use at night.   Also for hx of tick bite will get tick bite studies and follow those.  Will assess response to treatment and if studies negative and symptoms persist then consider referral to sport med MD or PT.  Follow up 7 days or as needed  [employer]

## 2020-01-04 NOTE — Addendum Note (Signed)
Addended by: Jeronimo Greaves on: 01/04/2020 11:11 AM   Modules accepted: Orders

## 2020-01-05 ENCOUNTER — Encounter: Payer: Self-pay | Admitting: Medical

## 2020-01-06 LAB — LYMEAB(IGG/M)+RMSF(IGG/M)
LYME DISEASE AB, QUANT, IGM: <0.8 index (ref 0.00–0.79)
Lyme IgG/IgM Ab: <0.91 {ISR} (ref 0.00–0.90)
RMSF IgG: NEGATIVE
RMSF IgM: 0.6 index (ref 0.00–0.89)

## 2020-01-11 ENCOUNTER — Ambulatory Visit: Payer: Managed Care, Other (non HMO) | Admitting: Medical

## 2020-01-22 ENCOUNTER — Other Ambulatory Visit: Payer: Self-pay | Admitting: Family Medicine

## 2020-02-14 ENCOUNTER — Other Ambulatory Visit: Payer: Self-pay

## 2020-02-14 ENCOUNTER — Ambulatory Visit (INDEPENDENT_AMBULATORY_CARE_PROVIDER_SITE_OTHER): Payer: Managed Care, Other (non HMO) | Admitting: Family Medicine

## 2020-02-14 ENCOUNTER — Encounter: Payer: Self-pay | Admitting: Family Medicine

## 2020-02-14 VITALS — BP 105/66 | HR 68 | Temp 98.4°F | Resp 12 | Ht 62.0 in | Wt 206.6 lb

## 2020-02-14 DIAGNOSIS — E039 Hypothyroidism, unspecified: Secondary | ICD-10-CM

## 2020-02-14 DIAGNOSIS — D649 Anemia, unspecified: Secondary | ICD-10-CM

## 2020-02-14 DIAGNOSIS — F17200 Nicotine dependence, unspecified, uncomplicated: Secondary | ICD-10-CM

## 2020-02-14 DIAGNOSIS — R030 Elevated blood-pressure reading, without diagnosis of hypertension: Secondary | ICD-10-CM

## 2020-02-14 DIAGNOSIS — Z1239 Encounter for other screening for malignant neoplasm of breast: Secondary | ICD-10-CM | POA: Diagnosis not present

## 2020-02-14 DIAGNOSIS — R739 Hyperglycemia, unspecified: Secondary | ICD-10-CM | POA: Diagnosis not present

## 2020-02-14 DIAGNOSIS — E785 Hyperlipidemia, unspecified: Secondary | ICD-10-CM | POA: Diagnosis not present

## 2020-02-14 DIAGNOSIS — M549 Dorsalgia, unspecified: Secondary | ICD-10-CM

## 2020-02-14 DIAGNOSIS — Z0000 Encounter for general adult medical examination without abnormal findings: Secondary | ICD-10-CM | POA: Diagnosis not present

## 2020-02-14 DIAGNOSIS — D696 Thrombocytopenia, unspecified: Secondary | ICD-10-CM

## 2020-02-14 DIAGNOSIS — N289 Disorder of kidney and ureter, unspecified: Secondary | ICD-10-CM | POA: Diagnosis not present

## 2020-02-14 MED ORDER — CYCLOBENZAPRINE HCL 10 MG PO TABS: 5.0000 mg | ORAL_TABLET | Freq: Every evening | ORAL | 1 refills | Status: DC | PRN

## 2020-02-14 NOTE — Patient Instructions (Signed)
[employer] Blood Pressure cuff, upper arm, want BP 100-140/60-90 Pulse oximeter, want oxygen in 90s  Weekly vitals  Take Multivitamin with minerals, selenium Vitamin D 1000-2000 IU daily Probiotic with lactobacillus and bifidophilus Asprin EC 81 mg daily Fish or krill oil caps  Melatonin 2-5 mg at bedtime Preventive Care 42-54 Years Old, Female Preventive care refers to visits with your health care provider and lifestyle choices that can promote health and wellness. This includes:  A yearly physical exam. This may also be called an annual well check.  Regular dental visits and eye exams.  Immunizations.  Screening for certain conditions.  Healthy lifestyle choices, such as eating a healthy diet, getting regular exercise, not using drugs or products that contain nicotine and tobacco, and limiting alcohol use. What can I expect for my preventive care visit? Physical exam Your health care provider will check your:  Height and weight. This may be used to calculate body mass index (BMI), which tells if you are at a healthy weight.  Heart rate and blood pressure.  Skin for abnormal spots. Counseling Your health care provider may ask you questions about your:  Alcohol, tobacco, and drug use.  Emotional well-being.  Home and relationship well-being.  Sexual activity.  Eating habits.  Work and work Statistician.  Method of birth control.  Menstrual cycle.  Pregnancy history. What immunizations do I need?  Influenza (flu) vaccine  This is recommended every year. Tetanus, diphtheria, and pertussis (Tdap) vaccine  You may need a Td booster every 10 years. Varicella (chickenpox) vaccine  You may need this if you have not been vaccinated. Zoster (shingles) vaccine  You may need this after age 54. Measles, mumps, and rubella (MMR) vaccine  You may need at least one dose of MMR if you were born in 1957 or later. You may also need a second dose. Pneumococcal conjugate  (PCV13) vaccine  You may need this if you have certain conditions and were not previously vaccinated. Pneumococcal polysaccharide (PPSV23) vaccine  You may need one or two doses if you smoke cigarettes or if you have certain conditions. Meningococcal conjugate (MenACWY) vaccine  You may need this if you have certain conditions. Hepatitis A vaccine  You may need this if you have certain conditions or if you travel or work in places where you may be exposed to hepatitis A. Hepatitis B vaccine  You may need this if you have certain conditions or if you travel or work in places where you may be exposed to hepatitis B. Haemophilus influenzae type b (Hib) vaccine  You may need this if you have certain conditions. Human papillomavirus (HPV) vaccine  If recommended by your health care provider, you may need three doses over 6 months. You may receive vaccines as individual doses or as more than one vaccine together in one shot (combination vaccines). Talk with your health care provider about the risks and benefits of combination vaccines. What tests do I need? Blood tests  Lipid and cholesterol levels. These may be checked every 5 years, or more frequently if you are over 54 years old.  Hepatitis C test.  Hepatitis B test. Screening  Lung cancer screening. You may have this screening every year starting at age 54 if you have a 30-pack-year history of smoking and currently smoke or have quit within the past 15 years.  Colorectal cancer screening. All adults should have this screening starting at age 54 and continuing until age 10. Your health care provider may recommend screening at  age 54 if you are at increased risk. You will have tests every 1-10 years, depending on your results and the type of screening test.  Diabetes screening. This is done by checking your blood sugar (glucose) after you have not eaten for a while (fasting). You may have this done every 1-3 years.  Mammogram. This  may be done every 1-2 years. Talk with your health care provider about when you should start having regular mammograms. This may depend on whether you have a family history of breast cancer.  BRCA-related cancer screening. This may be done if you have a family history of breast, ovarian, tubal, or peritoneal cancers.  Pelvic exam and Pap test. This may be done every 3 years starting at age 54. Starting at age 54, this may be done every 5 years if you have a Pap test in combination with an HPV test. Other tests  Sexually transmitted disease (STD) testing.  Bone density scan. This is done to screen for osteoporosis. You may have this scan if you are at high risk for osteoporosis. Follow these instructions at home: Eating and drinking  Eat a diet that includes fresh fruits and vegetables, whole grains, lean protein, and low-fat dairy.  Take vitamin and mineral supplements as recommended by your health care provider.  Do not drink alcohol if: ? Your health care provider tells you not to drink. ? You are pregnant, may be pregnant, or are planning to become pregnant.  If you drink alcohol: ? Limit how much you have to 0-1 drink a day. ? Be aware of how much alcohol is in your drink. In the [place] one drink equals one 12 oz bottle of beer (355 mL), one 5 oz glass of wine (148 mL), or one 1 oz glass of hard liquor (44 mL). Lifestyle  Take daily care of your teeth and gums.  Stay active. Exercise for at least 30 minutes on 5 or more days each week.  Do not use any products that contain nicotine or tobacco, such as cigarettes, e-cigarettes, and chewing tobacco. If you need help quitting, ask your health care provider.  If you are sexually active, practice safe sex. Use a condom or other form of birth control (contraception) in order to prevent pregnancy and STIs (sexually transmitted infections).  If told by your health care provider, take low-dose aspirin daily starting at age 54. What's  next?  Visit your health care provider once a year for a well check visit.  Ask your health care provider how often you should have your eyes and teeth checked.  Stay up to date on all vaccines. This information is not intended to replace advice given to you by your health care provider. Make sure you discuss any questions you have with your health care provider. Document Revised: 07/08/2018 Document Reviewed: 07/08/2018 Elsevier Patient Education  2020 [place].   [email or web address]

## 2020-02-14 NOTE — Assessment & Plan Note (Addendum)
hgba1c acceptable, minimize simple carbs. Increase exercise as tolerated.  

## 2020-02-14 NOTE — Assessment & Plan Note (Signed)
On Levothyroxine, continue to monitor 

## 2020-02-14 NOTE — Assessment & Plan Note (Signed)
Encouraged complete cessation. Discussed need to quit as relates to risk of numerous cancers, cardiac and pulmonary disease as well as neurologic complications. Counseled for greater than 3 minutes 

## 2020-02-15 ENCOUNTER — Telehealth: Payer: Self-pay | Admitting: *Deleted

## 2020-02-15 DIAGNOSIS — M549 Dorsalgia, unspecified: Secondary | ICD-10-CM | POA: Insufficient documentation

## 2020-02-15 DIAGNOSIS — Z0000 Encounter for general adult medical examination without abnormal findings: Secondary | ICD-10-CM | POA: Insufficient documentation

## 2020-02-15 LAB — LIPID PANEL
Cholesterol: 174 mg/dL (ref 0–200)
HDL: 39.6 mg/dL (ref >39.00)
LDL Cholesterol: 100 mg/dL — ABNORMAL HIGH (ref 0–99)
NonHDL: 134.52
Total CHOL/HDL Ratio: 4
Triglycerides: 173 mg/dL — ABNORMAL HIGH (ref 0.0–149.0)
VLDL: 34.6 mg/dL (ref 0.0–40.0)

## 2020-02-15 LAB — COMPREHENSIVE METABOLIC PANEL
ALT: 33 U/L (ref 0–35)
AST: 24 U/L (ref 0–37)
Albumin: 4.3 g/dL (ref 3.5–5.2)
Alkaline Phosphatase: 63 U/L (ref 39–117)
BUN: 15 mg/dL (ref 6–23)
CO2: 29 mEq/L (ref 19–32)
Calcium: 9.9 mg/dL (ref 8.4–10.5)
Chloride: 105 mEq/L (ref 96–112)
Creatinine, Ser: 1.02 mg/dL (ref 0.40–1.20)
GFR: 56.57 mL/min — ABNORMAL LOW (ref >60.00)
Glucose, Bld: 97 mg/dL (ref 70–99)
Potassium: 4.7 mEq/L (ref 3.5–5.1)
Sodium: 140 mEq/L (ref 135–145)
Total Bilirubin: 0.5 mg/dL (ref 0.2–1.2)
Total Protein: 6.8 g/dL (ref 6.0–8.3)

## 2020-02-15 LAB — CBC
HCT: 46.6 % — ABNORMAL HIGH (ref 36.0–46.0)
Hemoglobin: 15.8 g/dL — ABNORMAL HIGH (ref 12.0–15.0)
MCHC: 33.9 g/dL (ref 30.0–36.0)
MCV: 95.6 fl (ref 78.0–100.0)
Platelets: 137 10*3/uL — ABNORMAL LOW (ref 150.0–400.0)
RBC: 4.88 Mil/uL (ref 3.87–5.11)
RDW: 13.5 % (ref 11.5–15.5)
WBC: 8.1 10*3/uL (ref 4.0–10.5)

## 2020-02-15 LAB — HEMOGLOBIN A1C: Hgb A1c MFr Bld: 5.7 % (ref 4.6–6.5)

## 2020-02-15 LAB — TSH: TSH: 1.66 u[IU]/mL (ref 0.35–4.50)

## 2020-02-15 NOTE — Assessment & Plan Note (Signed)
Patient encouraged to maintain heart healthy diet, regular exercise, adequate sleep. Consider daily probiotics. Take medications as prescribed. MGM ordered. Labs ordered and reviewed

## 2020-02-15 NOTE — Telephone Encounter (Signed)
Order placed for cologuard online.

## 2020-02-15 NOTE — Assessment & Plan Note (Signed)
Hydrate and monitor 

## 2020-02-15 NOTE — Assessment & Plan Note (Signed)
Encouraged moist heat and gentle stretching as tolerated. May try NSAIDs and prescription meds as directed and report if symptoms worsen or seek immediate care. Try Flexeril prn

## 2020-02-15 NOTE — Progress Notes (Signed)
Subjective:    Patient ID: Peggy Thomas, female    DOB: 1966/03/21, 54 y.o.   MRN: [id number]  Chief Complaint  Patient presents with  . Annual Exam    HPI Patient is in today for annual preventative exam. No recent febrile illness or recent hospitalizations. No acute concerns. She I has been trying to maintain heart healthy diet and to stay active on a good day. She has had some intermittent trouble with back pain especially after doing yard work. Denies CP/palp/SOB/HA/congestion/fevers/GI or GU c/o. Taking meds as prescribed  Past Medical History:  Diagnosis Date  . Allergic state 05/01/2017  . Anemia 03/31/2015  . Aneurysm ([place])    on spleen-watching   . Chronic idiopathic thrombocytopenia (HCC)    MILD--   HEMATOLOGIST--  DR Marin Olp  . Depression 04/30/2011  . History of kidney stones   . History of tobacco abuse 04/30/2011  . Hyperglycemia 07/30/2017  . Hyperlipidemia 07/30/2017  . Hypocalcemia   . Hypothyroidism 04/30/2011  . Nausea without vomiting 03/26/2015  . Obesity   . PONV (postoperative nausea and vomiting)   . Renal insufficiency   . Right ureteral stone   . Sinusitis 04/30/2017    Past Surgical History:  Procedure Laterality Date  . ABDOMINAL HYSTERECTOMY    . [place]  . CHOLECYSTECTOMY  2000  . CYSTOSCOPY WITH RETROGRADE PYELOGRAM, URETEROSCOPY AND STENT PLACEMENT Right 07/25/2013   Procedure: CYSTOSCOPY WITH RETROGRADE PYELOGRAM, URETEROSCOPY, STONE EXTRACTION AND POSSIBLE STENT PLACEMENT;  Surgeon: Ailene Rud, MD;  Location: [facility];  Service: Urology;  Laterality: Right;  . EMBOLIZATION N/A 12/26/2014   Procedure: EMBOLIZATION;  Surgeon: Serafina Mitchell, MD;  Location: [facility] CATH LAB;  Service: Cardiovascular;  Laterality: N/A;  . EXTRACORPOREAL SHOCK WAVE LITHOTRIPSY  2003  . KNEE ARTHROSCOPY    . PERIPHERAL VASCULAR CATHETERIZATION  12/26/2014   Procedure: VISCERAL ANGIOGRAPHY;  Surgeon: Serafina Mitchell,  MD;  Location: [facility] CATH LAB;  Service: Cardiovascular;;  . ROBOTIC ASSISTED TOTAL HYSTERECTOMY  10-15-2010   LAPAROSCOPIC /  LYSIS ADHESIONS  . TUBAL LIGATION  1999  . VIDEO ASSISTED THORACOSCOPY (VATS)/EMPYEMA Left 02/06/2015   Procedure: LEFT VIDEO ASSISTED THORACOSCOPY (VATS)/DRAINAGE OF EMPYEMA;  Surgeon: Grace Isaac, MD;  Location: [facility];  Service: Thoracic;  Laterality: Left;  (L)VATS, DRAINAGE OF EMPYEMA  . VIDEO BRONCHOSCOPY N/A 02/06/2015   Procedure: VIDEO BRONCHOSCOPY;  Surgeon: Grace Isaac, MD;  Location: [facility] OR;  Service: Thoracic;  Laterality: N/A;    Family History  Problem Relation Age of Onset  . Diabetes Mother        type 2  . Asthma Mother   . Cancer Mother        skin cancer  . Renal Disease Mother   . Decreased libido Mother   . Dementia Mother   . Cancer Father        bone  . Mental illness Sister        schizophrenia  . Alcohol abuse Maternal Grandmother   . Stroke Maternal Grandmother   . Aneurysm Maternal Grandmother        brain aneurysm  . Other Maternal Grandfather        black lung  . Alzheimer's disease Paternal Grandmother   . Stroke Maternal Aunt     Social History   Socioeconomic History  . Marital status: Married    Spouse name: Not on file  . Number of children: Not on file  .  Years of education: Not on file  . Highest education level: Not on file  Occupational History  . Not on file  Tobacco Use  . Smoking status: Current Every Day Smoker    Packs/day: 0.75    Years: 30.00    Pack years: 22.50    Types: Cigarettes  . Smokeless tobacco: Never Used  Substance and Sexual Activity  . Alcohol use: Yes    Alcohol/week: 0.0 standard drinks    Comment: occasionally/socially  . Drug use: No  . Sexual activity: Yes    Partners: Male    Birth control/protection: Surgical    Comment: TLH/LOA  Other Topics Concern  . Not on file  Social History Narrative  . Not on file   Social Determinants of Health   Financial  Resource Strain:   . Difficulty of Paying Living Expenses:   Food Insecurity:   . Worried About Charity fundraiser in the Last Year:   . Arboriculturist in the Last Year:   Transportation Needs:   . Film/video editor (Medical):   Marland Kitchen Lack of Transportation (Non-Medical):   Physical Activity:   . Days of Exercise per Week:   . Minutes of Exercise per Session:   Stress:   . Feeling of Stress :   Social Connections:   . Frequency of Communication with Friends and Family:   . Frequency of Social Gatherings with Friends and Family:   . Attends Religious Services:   . Active Member of Clubs or Organizations:   . Attends Archivist Meetings:   Marland Kitchen Marital Status:   Intimate Partner Violence:   . Fear of Current or Ex-Partner:   . Emotionally Abused:   Marland Kitchen Physically Abused:   . Sexually Abused:     Outpatient Medications Prior to Visit  Medication Sig Dispense Refill  . famotidine (PEPCID) 40 MG tablet TAKE 1 TABLET BY MOUTH EVERYDAY AT BEDTIME 30 tablet 5  . ibuprofen (ADVIL) 400 MG tablet TAKE 1 TABLET (400 MG TOTAL) BY MOUTH EVERY 6 (SIX) HOURS AS NEEDED. 90 tablet 1  . levothyroxine (SYNTHROID) 88 MCG tablet TAKE 1 TABLET (88 MCG TOTAL) BY MOUTH DAILY BEFORE BREAKFAST. 90 tablet 1  . acetaminophen (TYLENOL) 500 MG tablet Take 1 tablet (500 mg total) by mouth every 6 (six) hours as needed for moderate pain. 30 tablet 0  . albuterol (PROVENTIL HFA;VENTOLIN HFA) 108 (90 Base) MCG/ACT inhaler Inhale 2 puffs into the lungs every 6 (six) hours as needed for wheezing or shortness of breath. 1 Inhaler 2  . aspirin EC 81 MG tablet Take 1 tablet (81 mg total) by mouth daily.    Marland Kitchen azithromycin (ZITHROMAX) 250 MG tablet Take 2 tablets by mouth on day 1, followed by 1 tablet by mouth daily for 4 days. 6 tablet 0  . b complex vitamins tablet Take 1 tablet by mouth as needed (for energy).     . benzonatate (TESSALON) 100 MG capsule Take 1 capsule (100 mg total) by mouth 3 (three) times  daily as needed for cough. 30 capsule 0  . cyclobenzaprine (FLEXERIL) 10 MG tablet Take 1 tablet (10 mg total) by mouth at bedtime. 7 tablet 0  . diclofenac (VOLTAREN) 75 MG EC tablet Take 1 tablet (75 mg total) by mouth 2 (two) times daily. 20 tablet 0  . fluticasone (FLONASE) 50 MCG/ACT nasal spray SPRAY 2 SPRAYS INTO EACH NOSTRIL EVERY DAY 16 mL 1  . nicotine (NICODERM CQ - DOSED IN  MG/24 HOURS) 14 mg/24hr patch Place 1 patch (14 mg total) onto the skin daily. 30 patch 1  . nicotine (NICODERM CQ - DOSED IN MG/24 HR) 7 mg/24hr patch Place 1 patch (7 mg total) onto the skin daily. 30 patch 1  . nitroGLYCERIN (NITROSTAT) 0.4 MG SL tablet Place 1 tablet (0.4 mg total) under the tongue every 5 (five) minutes as needed for chest pain. 25 tablet 1  . oseltamivir (TAMIFLU) 75 MG capsule Take 1 capsule (75 mg total) by mouth 2 (two) times daily. 10 capsule 0  . traMADol (ULTRAM) 50 MG tablet TAKE 1-2 TALETS BY MOUTH EVERY 6 HOURS AS NEEDED FOR MODERATE PAIN 60 tablet 0   No facility-administered medications prior to visit.    No Known Allergies  Review of Systems  Constitutional: Negative for fever and malaise/fatigue.  HENT: Negative for congestion.   Eyes: Negative for blurred vision.  Respiratory: Negative for shortness of breath.   Cardiovascular: Negative for chest pain, palpitations and leg swelling.  Gastrointestinal: Negative for abdominal pain, blood in stool and nausea.  Genitourinary: Negative for dysuria and frequency.  Musculoskeletal: Negative for falls.  Skin: Negative for rash.  Neurological: Negative for dizziness, loss of consciousness and headaches.  Endo/Heme/Allergies: Negative for environmental allergies.  Psychiatric/Behavioral: Negative for depression. The patient is not nervous/anxious.        Objective:    Physical Exam Vitals and nursing note reviewed.  Constitutional:      General: She is not in acute distress.    Appearance: She is well-developed.  HENT:       Head: Normocephalic and atraumatic.     Nose: Nose normal.  Eyes:     General:        Right eye: No discharge.        Left eye: No discharge.  Cardiovascular:     Rate and Rhythm: Normal rate and regular rhythm.     Heart sounds: No murmur.  Pulmonary:     Effort: Pulmonary effort is normal.     Breath sounds: Normal breath sounds.  Abdominal:     General: Bowel sounds are normal.     Palpations: Abdomen is soft.     Tenderness: There is no abdominal tenderness.  Musculoskeletal:     Cervical back: Normal range of motion and neck supple.  Skin:    General: Skin is warm and dry.  Neurological:     Mental Status: She is alert and oriented to person, place, and time.     BP 105/66 (BP Location: Left Arm, Cuff Size: Large)   Pulse 68   Temp 98.4 F (36.9 C) (Temporal)   Resp 12   Ht 5\' 2"  (1.575 m)   Wt 206 lb 9.6 oz (93.7 kg)   LMP 08/10/2010   SpO2 97%   BMI 37.79 kg/m  Wt Readings from Last 3 Encounters:  02/14/20 206 lb 9.6 oz (93.7 kg)  01/04/20 206 lb 6.4 oz (93.6 kg)  12/22/18 205 lb 3.2 oz (93.1 kg)    Diabetic Foot Exam - Simple   No data filed     Lab Results  Component Value Date   WBC 8.1 02/14/2020   HGB 15.8 (H) 02/14/2020   HCT 46.6 (H) 02/14/2020   PLT 137.0 (L) 02/14/2020   GLUCOSE 97 02/14/2020   CHOL 174 02/14/2020   TRIG 173.0 (H) 02/14/2020   HDL 39.60 02/14/2020   LDLDIRECT 136.0 09/14/2018   LDLCALC 100 (H) 02/14/2020   ALT 33  02/14/2020   AST 24 02/14/2020   NA 140 02/14/2020   K 4.7 02/14/2020   CL 105 02/14/2020   CREATININE 1.02 02/14/2020   BUN 15 02/14/2020   CO2 29 02/14/2020   TSH 1.66 02/14/2020   INR 1.01 05/04/2017   HGBA1C 5.7 02/14/2020    Lab Results  Component Value Date   TSH 1.66 02/14/2020   Lab Results  Component Value Date   WBC 8.1 02/14/2020   HGB 15.8 (H) 02/14/2020   HCT 46.6 (H) 02/14/2020   MCV 95.6 02/14/2020   PLT 137.0 (L) 02/14/2020   Lab Results  Component Value Date   NA 140  02/14/2020   K 4.7 02/14/2020   CO2 29 02/14/2020   GLUCOSE 97 02/14/2020   BUN 15 02/14/2020   CREATININE 1.02 02/14/2020   BILITOT 0.5 02/14/2020   ALKPHOS 63 02/14/2020   AST 24 02/14/2020   ALT 33 02/14/2020   PROT 6.8 02/14/2020   ALBUMIN 4.3 02/14/2020   CALCIUM 9.9 02/14/2020   ANIONGAP 9 08/28/2017   GFR 56.57 (L) 02/14/2020   Lab Results  Component Value Date   CHOL 174 02/14/2020   Lab Results  Component Value Date   HDL 39.60 02/14/2020   Lab Results  Component Value Date   LDLCALC 100 (H) 02/14/2020   Lab Results  Component Value Date   TRIG 173.0 (H) 02/14/2020   Lab Results  Component Value Date   CHOLHDL 4 02/14/2020   Lab Results  Component Value Date   HGBA1C 5.7 02/14/2020       Assessment & Plan:   Problem List Items Addressed This Visit    Tobacco use disorder    Encouraged complete cessation. Discussed need to quit as relates to risk of numerous cancers, cardiac and pulmonary disease as well as neurologic complications. Counseled for greater than 3 minutes.       Thrombocytopenia ([place])   Renal insufficiency    Hydrate and monitor      Elevated blood pressure reading   Hypothyroidism    On Levothyroxine, continue to monitor      Relevant Orders   TSH (Completed)   Anemia   Relevant Orders   CBC (Completed)   Hyperglycemia    hgba1c acceptable, minimize simple carbs. Increase exercise as tolerated.       Relevant Orders   Comprehensive metabolic panel (Completed)   Hemoglobin A1c (Completed)   Hyperlipidemia   Relevant Orders   Lipid panel (Completed)   Preventative health care    Patient encouraged to maintain heart healthy diet, regular exercise, adequate sleep. Consider daily probiotics. Take medications as prescribed. MGM ordered. Labs ordered and reviewed       Other Visit Diagnoses    Encounter for screening for malignant neoplasm of breast, unspecified screening modality    -  Primary   Relevant Orders   MM 3D  SCREEN BREAST BILATERAL      I have discontinued Tori Milks. Cogan's b complex vitamins, acetaminophen, traMADol, nitroGLYCERIN, aspirin EC, nicotine, nicotine, oseltamivir, benzonatate, albuterol, azithromycin, fluticasone, and diclofenac. I have also changed her cyclobenzaprine. Additionally, I am having her maintain her levothyroxine, famotidine, and ibuprofen.  Meds ordered this encounter  Medications  . cyclobenzaprine (FLEXERIL) 10 MG tablet    Sig: Take 0.5-1 tablets (5-10 mg total) by mouth at bedtime as needed for muscle spasms.    Dispense:  30 tablet    Refill:  1     Penni Homans, MD

## 2020-02-16 ENCOUNTER — Other Ambulatory Visit: Payer: Self-pay | Admitting: *Deleted

## 2020-02-16 DIAGNOSIS — R739 Hyperglycemia, unspecified: Secondary | ICD-10-CM

## 2020-02-16 DIAGNOSIS — E785 Hyperlipidemia, unspecified: Secondary | ICD-10-CM

## 2020-02-17 ENCOUNTER — Ambulatory Visit (HOSPITAL_BASED_OUTPATIENT_CLINIC_OR_DEPARTMENT_OTHER)
Admission: RE | Admit: 2020-02-17 | Discharge: 2020-02-17 | Disposition: A | Discharge status: Home / Self Care | Payer: Managed Care, Other (non HMO) | Source: Ambulatory Visit | Attending: Family Medicine | Admitting: Family Medicine

## 2020-02-17 ENCOUNTER — Other Ambulatory Visit: Payer: Self-pay

## 2020-02-17 DIAGNOSIS — Z1239 Encounter for other screening for malignant neoplasm of breast: Secondary | ICD-10-CM

## 2020-02-17 DIAGNOSIS — Z1231 Encounter for screening mammogram for malignant neoplasm of breast: Secondary | ICD-10-CM | POA: Insufficient documentation

## 2020-02-20 ENCOUNTER — Other Ambulatory Visit: Payer: Self-pay | Admitting: Family Medicine

## 2020-02-20 DIAGNOSIS — R928 Other abnormal and inconclusive findings on diagnostic imaging of breast: Secondary | ICD-10-CM

## 2020-03-05 ENCOUNTER — Ambulatory Visit
Admission: RE | Admit: 2020-03-05 | Discharge: 2020-03-05 | Disposition: A | Discharge status: Home / Self Care | Payer: Managed Care, Other (non HMO) | Source: Ambulatory Visit | Attending: Family Medicine | Admitting: Family Medicine

## 2020-03-05 ENCOUNTER — Other Ambulatory Visit: Payer: Self-pay

## 2020-03-05 ENCOUNTER — Other Ambulatory Visit: Payer: Self-pay | Admitting: Family Medicine

## 2020-03-05 DIAGNOSIS — R928 Other abnormal and inconclusive findings on diagnostic imaging of breast: Secondary | ICD-10-CM

## 2020-03-05 DIAGNOSIS — N6320 Unspecified lump in the left breast, unspecified quadrant: Secondary | ICD-10-CM

## 2020-03-22 ENCOUNTER — Other Ambulatory Visit: Payer: Self-pay | Admitting: Family Medicine

## 2020-05-17 ENCOUNTER — Other Ambulatory Visit: Payer: Managed Care, Other (non HMO)

## 2020-05-22 ENCOUNTER — Other Ambulatory Visit (INDEPENDENT_AMBULATORY_CARE_PROVIDER_SITE_OTHER): Payer: Managed Care, Other (non HMO)

## 2020-05-22 ENCOUNTER — Other Ambulatory Visit: Payer: Self-pay

## 2020-05-22 DIAGNOSIS — R739 Hyperglycemia, unspecified: Secondary | ICD-10-CM | POA: Diagnosis not present

## 2020-05-22 DIAGNOSIS — E785 Hyperlipidemia, unspecified: Secondary | ICD-10-CM | POA: Diagnosis not present

## 2020-05-22 LAB — LIPID PANEL
Cholesterol: 190 mg/dL (ref 0–200)
HDL: 36.7 mg/dL — ABNORMAL LOW (ref >39.00)
NonHDL: 153.53
Total CHOL/HDL Ratio: 5
Triglycerides: 262 mg/dL — ABNORMAL HIGH (ref 0.0–149.0)
VLDL: 52.4 mg/dL — ABNORMAL HIGH (ref 0.0–40.0)

## 2020-05-22 LAB — COMPREHENSIVE METABOLIC PANEL
ALT: 26 U/L (ref 0–35)
AST: 19 U/L (ref 0–37)
Albumin: 4.3 g/dL (ref 3.5–5.2)
Alkaline Phosphatase: 65 U/L (ref 39–117)
BUN: 14 mg/dL (ref 6–23)
CO2: 29 mEq/L (ref 19–32)
Calcium: 9.6 mg/dL (ref 8.4–10.5)
Chloride: 103 mEq/L (ref 96–112)
Creatinine, Ser: 0.93 mg/dL (ref 0.40–1.20)
GFR: 62.87 mL/min (ref >60.00)
Glucose, Bld: 133 mg/dL — ABNORMAL HIGH (ref 70–99)
Potassium: 3.9 mEq/L (ref 3.5–5.1)
Sodium: 139 mEq/L (ref 135–145)
Total Bilirubin: 0.5 mg/dL (ref 0.2–1.2)
Total Protein: 6.8 g/dL (ref 6.0–8.3)

## 2020-05-22 LAB — LDL CHOLESTEROL, DIRECT: Direct LDL: 121 mg/dL

## 2020-05-22 LAB — HEMOGLOBIN A1C: Hgb A1c MFr Bld: 5.9 % (ref 4.6–6.5)

## 2020-08-16 ENCOUNTER — Encounter: Payer: Self-pay | Admitting: Family Medicine

## 2020-08-16 ENCOUNTER — Other Ambulatory Visit: Payer: Self-pay

## 2020-08-16 ENCOUNTER — Ambulatory Visit: Payer: Managed Care, Other (non HMO) | Admitting: Family Medicine

## 2020-08-16 VITALS — BP 120/78 | HR 77 | Temp 98.9°F | Resp 12 | Ht 61.0 in | Wt 205.0 lb

## 2020-08-16 DIAGNOSIS — Z23 Encounter for immunization: Secondary | ICD-10-CM | POA: Diagnosis not present

## 2020-08-16 DIAGNOSIS — E785 Hyperlipidemia, unspecified: Secondary | ICD-10-CM

## 2020-08-16 DIAGNOSIS — R700 Elevated erythrocyte sedimentation rate: Secondary | ICD-10-CM | POA: Diagnosis not present

## 2020-08-16 DIAGNOSIS — R739 Hyperglycemia, unspecified: Secondary | ICD-10-CM | POA: Diagnosis not present

## 2020-08-16 DIAGNOSIS — R030 Elevated blood-pressure reading, without diagnosis of hypertension: Secondary | ICD-10-CM

## 2020-08-16 DIAGNOSIS — E039 Hypothyroidism, unspecified: Secondary | ICD-10-CM

## 2020-08-16 DIAGNOSIS — K219 Gastro-esophageal reflux disease without esophagitis: Secondary | ICD-10-CM

## 2020-08-16 NOTE — Assessment & Plan Note (Addendum)
hgba1c acceptable, minimize simple carbs. Increase exercise as tolerated. Flu shot given today 

## 2020-08-16 NOTE — Assessment & Plan Note (Signed)
Continue to monitor

## 2020-08-16 NOTE — Assessment & Plan Note (Addendum)
Continue to monitor

## 2020-08-16 NOTE — Patient Instructions (Addendum)
Shingrix is the new shingles shot, 2 shots over 2-6 months, confirm with insurance that they cover it and docuemnt and then confirm where it is cheapest  NOOM APP for weight loss Hypothyroidism  Hypothyroidism is when the thyroid gland does not make enough of certain hormones (it is underactive). The thyroid gland is a small gland located in the lower front part of the neck, just in front of the windpipe (trachea). This gland makes hormones that help control how the body uses food for energy (metabolism) as well as how the heart and brain function. These hormones also play a role in keeping your bones strong. When the thyroid is underactive, it produces too little of the hormones thyroxine (T4) and triiodothyronine (T3). What are the causes? This condition may be caused by:  Hashimoto's disease. This is a disease in which the body's disease-fighting system (immune system) attacks the thyroid gland. This is the most common cause.  Viral infections.  Pregnancy.  Certain medicines.  Birth defects.  Past radiation treatments to the head or neck for cancer.  Past treatment with radioactive iodine.  Past exposure to radiation in the environment.  Past surgical removal of part or all of the thyroid.  Problems with a gland in the center of the brain (pituitary gland).  Lack of enough iodine in the diet. What increases the risk? You are more likely to develop this condition if:  You are female.  You have a family history of thyroid conditions.  You use a medicine called lithium.  You take medicines that affect the immune system (immunosuppressants). What are the signs or symptoms? Symptoms of this condition include:  Feeling as though you have no energy (lethargy).  Not being able to tolerate cold.  Weight gain that is not explained by a change in diet or exercise habits.  Lack of appetite.  Dry skin.  Coarse hair.  Menstrual irregularity.  Slowing of thought  processes.  Constipation.  Sadness or depression. How is this diagnosed? This condition may be diagnosed based on:  Your symptoms, your medical history, and a physical exam.  Blood tests. You may also have imaging tests, such as an ultrasound or MRI. How is this treated? This condition is treated with medicine that replaces the thyroid hormones that your body does not make. After you begin treatment, it may take several weeks for symptoms to go away. Follow these instructions at home:  Take over-the-counter and prescription medicines only as told by your health care provider.  If you start taking any new medicines, tell your health care provider.  Keep all follow-up visits as told by your health care provider. This is important. ? As your condition improves, your dosage of thyroid hormone medicine may change. ? You will need to have blood tests regularly so that your health care provider can monitor your condition. Contact a health care provider if:  Your symptoms do not get better with treatment.  You are taking thyroid replacement medicine and you: ? Sweat a lot. ? Have tremors. ? Feel anxious. ? Lose weight rapidly. ? Cannot tolerate heat. ? Have emotional swings. ? Have diarrhea. ? Feel weak. Get help right away if you have:  Chest pain.  An irregular heartbeat.  A rapid heartbeat.  Difficulty breathing. Summary  Hypothyroidism is when the thyroid gland does not make enough of certain hormones (it is underactive).  When the thyroid is underactive, it produces too little of the hormones thyroxine (T4) and triiodothyronine (T3).  The  most common cause is Hashimoto's disease, a disease in which the body's disease-fighting system (immune system) attacks the thyroid gland. The condition can also be caused by viral infections, medicine, pregnancy, or past radiation treatment to the head or neck.  Symptoms may include weight gain, dry skin, constipation, feeling as  though you do not have energy, and not being able to tolerate cold.  This condition is treated with medicine to replace the thyroid hormones that your body does not make. This information is not intended to replace advice given to you by your health care provider. Make sure you discuss any questions you have with your health care provider. Document Revised: 10/09/2017 Document Reviewed: 10/07/2017 Elsevier Patient Education  2020 [employer].

## 2020-08-16 NOTE — Assessment & Plan Note (Signed)
Encouraged heart healthy diet, increase exercise, avoid trans fats, consider a krill oil cap daily 

## 2020-08-20 NOTE — Assessment & Plan Note (Signed)
On Levothyroxine, continue to monitor 

## 2020-08-20 NOTE — Assessment & Plan Note (Signed)
Avoid offending foods, start probiotics. Do not eat large meals in late evening and consider raising head of bed. Famotidine prn

## 2020-08-20 NOTE — Progress Notes (Signed)
Subjective:    Patient ID: Peggy Thomas, female    DOB: Sep 15, 1966, 54 y.o.   MRN: [id number]  Chief Complaint  Patient presents with  . 6 months followup    HPI Patient is in today for follow up on chronic medical concerns. No recent febrile illness or hospitalizations. No acute concerns. Is trying to stay busy and maintain a heart healthy diet. Denies CP/palp/SOB/HA/congestion/fevers/GI or GU c/o. Taking meds as prescribed  Past Medical History:  Diagnosis Date  . Allergic state 05/01/2017  . Anemia 03/31/2015  . Aneurysm ([place])    on spleen-watching   . Chronic idiopathic thrombocytopenia (H[facility])    MILD--   HEMATOLOGIST--  DR Marin Olp  . Depression 04/30/2011  . History of kidney stones   . History of tobacco abuse 04/30/2011  . Hyperglycemia 07/30/2017  . Hyperlipidemia 07/30/2017  . Hypocalcemia   . Hypothyroidism 04/30/2011  . Nausea without vomiting 03/26/2015  . Obesity   . PONV (postoperative nausea and vomiting)   . Renal insufficiency   . Right ureteral stone   . Sinusitis 04/30/2017    Past Surgical History:  Procedure Laterality Date  . ABDOMINAL HYSTERECTOMY    . [place]  . CHOLECYSTECTOMY  2000  . CYSTOSCOPY WITH RETROGRADE PYELOGRAM, URETEROSCOPY AND STENT PLACEMENT Right 07/25/2013   Procedure: CYSTOSCOPY WITH RETROGRADE PYELOGRAM, URETEROSCOPY, STONE EXTRACTION AND POSSIBLE STENT PLACEMENT;  Surgeon: Ailene Rud, MD;  Location: [facility];  Service: Urology;  Laterality: Right;  . EMBOLIZATION N/A 12/26/2014   Procedure: EMBOLIZATION;  Surgeon: Serafina Mitchell, MD;  Location: [facility] CATH LAB;  Service: Cardiovascular;  Laterality: N/A;  . EXTRACORPOREAL SHOCK WAVE LITHOTRIPSY  2003  . KNEE ARTHROSCOPY    . PERIPHERAL VASCULAR CATHETERIZATION  12/26/2014   Procedure: VISCERAL ANGIOGRAPHY;  Surgeon: Serafina Mitchell, MD;  Location: [facility] CATH LAB;  Service: Cardiovascular;;  . ROBOTIC ASSISTED TOTAL HYSTERECTOMY   10-15-2010   LAPAROSCOPIC /  LYSIS ADHESIONS  . TUBAL LIGATION  1999  . VIDEO ASSISTED THORACOSCOPY (VATS)/EMPYEMA Left 02/06/2015   Procedure: LEFT VIDEO ASSISTED THORACOSCOPY (VATS)/DRAINAGE OF EMPYEMA;  Surgeon: Grace Isaac, MD;  Location: [place];  Service: Thoracic;  Laterality: Left;  (L)VATS, DRAINAGE OF EMPYEMA  . VIDEO BRONCHOSCOPY N/A 02/06/2015   Procedure: VIDEO BRONCHOSCOPY;  Surgeon: Grace Isaac, MD;  Location: [facility] OR;  Service: Thoracic;  Laterality: N/A;    Family History  Problem Relation Age of Onset  . Diabetes Mother        type 2  . Asthma Mother   . Cancer Mother        skin cancer  . Renal Disease Mother   . Decreased libido Mother   . Dementia Mother   . Cancer Father        bone  . Mental illness Sister        schizophrenia  . Alcohol abuse Maternal Grandmother   . Stroke Maternal Grandmother   . Aneurysm Maternal Grandmother        brain aneurysm  . Other Maternal Grandfather        black lung  . Alzheimer's disease Paternal Grandmother   . Stroke Maternal Aunt     Social History   Socioeconomic History  . Marital status: Married    Spouse name: Not on file  . Number of children: Not on file  . Years of education: Not on file  . Highest education level: Not on file  Occupational  History  . Not on file  Tobacco Use  . Smoking status: Current Every Day Smoker    Packs/day: 0.75    Years: 30.00    Pack years: 22.50    Types: Cigarettes  . Smokeless tobacco: Never Used  Substance and Sexual Activity  . Alcohol use: Yes    Alcohol/week: 0.0 standard drinks    Comment: occasionally/socially  . Drug use: No  . Sexual activity: Yes    Partners: Male    Birth control/protection: Surgical    Comment: TLH/LOA  Other Topics Concern  . Not on file  Social History Narrative  . Not on file   Social Determinants of Health   Financial Resource Strain:   . Difficulty of Paying Living Expenses: Not on file  Food Insecurity:   .  Worried About Charity fundraiser in the Last Year: Not on file  . Ran Out of Food in the Last Year: Not on file  Transportation Needs:   . Lack of Transportation (Medical): Not on file  . Lack of Transportation (Non-Medical): Not on file  Physical Activity:   . Days of Exercise per Week: Not on file  . Minutes of Exercise per Session: Not on file  Stress:   . Feeling of Stress : Not on file  Social Connections:   . Frequency of Communication with Friends and Family: Not on file  . Frequency of Social Gatherings with Friends and Family: Not on file  . Attends Religious Services: Not on file  . Active Member of Clubs or Organizations: Not on file  . Attends Archivist Meetings: Not on file  . Marital Status: Not on file  Intimate Partner Violence:   . Fear of Current or Ex-Partner: Not on file  . Emotionally Abused: Not on file  . Physically Abused: Not on file  . Sexually Abused: Not on file    Outpatient Medications Prior to Visit  Medication Sig Dispense Refill  . cyclobenzaprine (FLEXERIL) 10 MG tablet Take 0.5-1 tablets (5-10 mg total) by mouth at bedtime as needed for muscle spasms. 30 tablet 1  . famotidine (PEPCID) 40 MG tablet TAKE 1 TABLET BY MOUTH EVERYDAY AT BEDTIME 30 tablet 5  . ibuprofen (ADVIL) 400 MG tablet TAKE 1 TABLET (400 MG TOTAL) BY MOUTH EVERY 6 (SIX) HOURS AS NEEDED. 90 tablet 1  . levothyroxine (SYNTHROID) 88 MCG tablet TAKE 1 TABLET (88 MCG TOTAL) BY MOUTH DAILY BEFORE BREAKFAST. 90 tablet 1   No facility-administered medications prior to visit.    No Known Allergies  Review of Systems  Constitutional: Negative for fever and malaise/fatigue.  HENT: Negative for congestion.   Eyes: Negative for blurred vision.  Respiratory: Negative for shortness of breath.   Cardiovascular: Negative for chest pain, palpitations and leg swelling.  Gastrointestinal: Negative for abdominal pain, blood in stool and nausea.  Genitourinary: Negative for dysuria  and frequency.  Musculoskeletal: Negative for falls.  Skin: Negative for rash.  Neurological: Negative for dizziness, loss of consciousness and headaches.  Endo/Heme/Allergies: Negative for environmental allergies.  Psychiatric/Behavioral: Negative for depression. The patient is not nervous/anxious.        Objective:    Physical Exam Vitals and nursing note reviewed.  Constitutional:      General: She is not in acute distress.    Appearance: She is well-developed.  HENT:     Head: Normocephalic and atraumatic.     Nose: Nose normal.  Eyes:     General:  Right eye: No discharge.        Left eye: No discharge.  Cardiovascular:     Rate and Rhythm: Normal rate and regular rhythm.     Heart sounds: No murmur heard.   Pulmonary:     Effort: Pulmonary effort is normal.     Breath sounds: Normal breath sounds.  Abdominal:     General: Bowel sounds are normal.     Palpations: Abdomen is soft.     Tenderness: There is no abdominal tenderness.  Musculoskeletal:     Cervical back: Normal range of motion and neck supple.  Skin:    General: Skin is warm and dry.  Neurological:     Mental Status: She is alert and oriented to person, place, and time.     BP 120/78 (BP Location: Left Arm, Patient Position: Sitting, Cuff Size: Small)   Pulse 77   Temp 98.9 F (37.2 C) (Oral)   Resp 12   Ht 5\' 1"  (1.549 m)   Wt 205 lb (93 kg)   LMP 08/10/2010   SpO2 96%   BMI 38.73 kg/m  Wt Readings from Last 3 Encounters:  08/16/20 205 lb (93 kg)  02/14/20 206 lb 9.6 oz (93.7 kg)  01/04/20 206 lb 6.4 oz (93.6 kg)    Diabetic Foot Exam - Simple   No data filed     Lab Results  Component Value Date   WBC 8.1 02/14/2020   HGB 15.8 (H) 02/14/2020   HCT 46.6 (H) 02/14/2020   PLT 137.0 (L) 02/14/2020   GLUCOSE 133 (H) 05/22/2020   CHOL 190 05/22/2020   TRIG 262.0 (H) 05/22/2020   HDL 36.70 (L) 05/22/2020   LDLDIRECT 121.0 05/22/2020   LDLCALC 100 (H) 02/14/2020   ALT 26  05/22/2020   AST 19 05/22/2020   NA 139 05/22/2020   K 3.9 05/22/2020   CL 103 05/22/2020   CREATININE 0.93 05/22/2020   BUN 14 05/22/2020   CO2 29 05/22/2020   TSH 1.66 02/14/2020   INR 1.01 05/04/2017   HGBA1C 5.9 05/22/2020    Lab Results  Component Value Date   TSH 1.66 02/14/2020   Lab Results  Component Value Date   WBC 8.1 02/14/2020   HGB 15.8 (H) 02/14/2020   HCT 46.6 (H) 02/14/2020   MCV 95.6 02/14/2020   PLT 137.0 (L) 02/14/2020   Lab Results  Component Value Date   NA 139 05/22/2020   K 3.9 05/22/2020   CO2 29 05/22/2020   GLUCOSE 133 (H) 05/22/2020   BUN 14 05/22/2020   CREATININE 0.93 05/22/2020   BILITOT 0.5 05/22/2020   ALKPHOS 65 05/22/2020   AST 19 05/22/2020   ALT 26 05/22/2020   PROT 6.8 05/22/2020   ALBUMIN 4.3 05/22/2020   CALCIUM 9.6 05/22/2020   ANIONGAP 9 08/28/2017   GFR 62.87 05/22/2020   Lab Results  Component Value Date   CHOL 190 05/22/2020   Lab Results  Component Value Date   HDL 36.70 (L) 05/22/2020   Lab Results  Component Value Date   LDLCALC 100 (H) 02/14/2020   Lab Results  Component Value Date   TRIG 262.0 (H) 05/22/2020   Lab Results  Component Value Date   CHOLHDL 5 05/22/2020   Lab Results  Component Value Date   HGBA1C 5.9 05/22/2020       Assessment & Plan:   Problem List Items Addressed This Visit    Elevated blood pressure reading    Continue to  monitor      Relevant Orders   CBC   Comprehensive metabolic panel   TSH   Hypothyroidism    On Levothyroxine, continue to monitor      Elevated sed rate    Continue to monitor      Relevant Orders   Sedimentation rate   Hyperglycemia    hgba1c acceptable, minimize simple carbs. Increase exercise as tolerated. .Flu shot given today      Relevant Orders   Hemoglobin A1c   Hyperlipidemia    Encouraged heart healthy diet, increase exercise, avoid trans fats, consider a krill oil cap daily      Relevant Orders   Lipid panel   Acid  reflux    Avoid offending foods, start probiotics. Do not eat large meals in late evening and consider raising head of bed. Famotidine prn       Other Visit Diagnoses    Influenza vaccine needed    -  Primary   Relevant Orders   Flu Vaccine QUAD 6+ mos PF IM (Fluarix Quad PF) (Completed)      I am having Peggy Thomas maintain her famotidine, ibuprofen, cyclobenzaprine, and levothyroxine.  No orders of the defined types were placed in this encounter.    Penni Homans, MD

## 2020-09-05 ENCOUNTER — Other Ambulatory Visit: Payer: Self-pay | Admitting: Family Medicine

## 2020-09-05 ENCOUNTER — Other Ambulatory Visit: Payer: Self-pay

## 2020-09-05 ENCOUNTER — Ambulatory Visit
Admission: RE | Admit: 2020-09-05 | Discharge: 2020-09-05 | Disposition: A | Discharge status: Home / Self Care | Payer: Managed Care, Other (non HMO) | Source: Ambulatory Visit | Attending: Family Medicine | Admitting: Family Medicine

## 2020-09-05 DIAGNOSIS — N6320 Unspecified lump in the left breast, unspecified quadrant: Secondary | ICD-10-CM

## 2020-10-07 ENCOUNTER — Other Ambulatory Visit: Payer: Self-pay | Admitting: Family Medicine

## 2021-01-03 ENCOUNTER — Other Ambulatory Visit: Payer: Self-pay | Admitting: Family Medicine

## 2021-02-14 ENCOUNTER — Other Ambulatory Visit (INDEPENDENT_AMBULATORY_CARE_PROVIDER_SITE_OTHER): Payer: Managed Care, Other (non HMO)

## 2021-02-14 ENCOUNTER — Other Ambulatory Visit: Payer: Self-pay

## 2021-02-14 ENCOUNTER — Other Ambulatory Visit: Payer: Managed Care, Other (non HMO)

## 2021-02-14 DIAGNOSIS — R030 Elevated blood-pressure reading, without diagnosis of hypertension: Secondary | ICD-10-CM

## 2021-02-14 DIAGNOSIS — E785 Hyperlipidemia, unspecified: Secondary | ICD-10-CM

## 2021-02-14 DIAGNOSIS — R739 Hyperglycemia, unspecified: Secondary | ICD-10-CM | POA: Diagnosis not present

## 2021-02-14 DIAGNOSIS — R700 Elevated erythrocyte sedimentation rate: Secondary | ICD-10-CM

## 2021-02-15 ENCOUNTER — Encounter: Payer: Self-pay | Admitting: *Deleted

## 2021-02-15 LAB — COMPREHENSIVE METABOLIC PANEL
AG Ratio: 1.5 (calc) (ref 1.0–2.5)
ALT: 23 U/L (ref 6–29)
AST: 19 U/L (ref 10–35)
Albumin: 4 g/dL (ref 3.6–5.1)
Alkaline phosphatase (APISO): 63 U/L (ref 37–153)
BUN: 13 mg/dL (ref 7–25)
CO2: 26 mmol/L (ref 20–32)
Calcium: 9.4 mg/dL (ref 8.6–10.4)
Chloride: 106 mmol/L (ref 98–110)
Creat: 0.9 mg/dL (ref 0.50–1.05)
Globulin: 2.6 g/dL (calc) (ref 1.9–3.7)
Glucose, Bld: 95 mg/dL (ref 65–99)
Potassium: 4.3 mmol/L (ref 3.5–5.3)
Sodium: 140 mmol/L (ref 135–146)
Total Bilirubin: 0.4 mg/dL (ref 0.2–1.2)
Total Protein: 6.6 g/dL (ref 6.1–8.1)

## 2021-02-15 LAB — CBC
HCT: 46.7 % — ABNORMAL HIGH (ref 35.0–45.0)
Hemoglobin: 15.4 g/dL (ref 11.7–15.5)
MCH: 30.6 pg (ref 27.0–33.0)
MCHC: 33 g/dL (ref 32.0–36.0)
MCV: 92.8 fL (ref 80.0–100.0)
MPV: 11.4 fL (ref 7.5–12.5)
Platelets: 148 10*3/uL (ref 140–400)
RBC: 5.03 10*6/uL (ref 3.80–5.10)
RDW: 12.6 % (ref 11.0–15.0)
WBC: 5.4 10*3/uL (ref 3.8–10.8)

## 2021-02-15 LAB — LIPID PANEL
Cholesterol: 176 mg/dL (ref <200)
HDL: 39 mg/dL — ABNORMAL LOW (ref >=50)
LDL Cholesterol (Calc): 109 mg/dL (calc) — ABNORMAL HIGH
Non-HDL Cholesterol (Calc): 137 mg/dL (calc) — ABNORMAL HIGH (ref <130)
Total CHOL/HDL Ratio: 4.5 (calc) (ref <5.0)
Triglycerides: 168 mg/dL — ABNORMAL HIGH (ref <150)

## 2021-02-15 LAB — HEMOGLOBIN A1C
Hgb A1c MFr Bld: 5.5 % of total Hgb (ref <5.7)
Mean Plasma Glucose: 111 mg/dL
eAG (mmol/L): 6.2 mmol/L

## 2021-02-15 LAB — TSH: TSH: 3.3 mIU/L

## 2021-02-15 LAB — SEDIMENTATION RATE: Sed Rate: 11 mm/h (ref 0–30)

## 2021-02-18 ENCOUNTER — Other Ambulatory Visit: Payer: Self-pay

## 2021-02-18 ENCOUNTER — Ambulatory Visit
Admission: RE | Admit: 2021-02-18 | Discharge: 2021-02-18 | Disposition: A | Discharge status: Home / Self Care | Payer: Managed Care, Other (non HMO) | Source: Ambulatory Visit | Attending: Family Medicine | Admitting: Family Medicine

## 2021-02-18 DIAGNOSIS — N6320 Unspecified lump in the left breast, unspecified quadrant: Secondary | ICD-10-CM

## 2021-02-21 ENCOUNTER — Other Ambulatory Visit: Payer: Self-pay

## 2021-02-21 ENCOUNTER — Encounter: Payer: Self-pay | Admitting: Family Medicine

## 2021-02-21 ENCOUNTER — Ambulatory Visit (INDEPENDENT_AMBULATORY_CARE_PROVIDER_SITE_OTHER): Payer: Managed Care, Other (non HMO) | Admitting: Family Medicine

## 2021-02-21 VITALS — BP 102/68 | HR 72 | Temp 98.1°F | Resp 16 | Ht 61.0 in | Wt 207.0 lb

## 2021-02-21 DIAGNOSIS — F32A Depression, unspecified: Secondary | ICD-10-CM

## 2021-02-21 DIAGNOSIS — R739 Hyperglycemia, unspecified: Secondary | ICD-10-CM | POA: Diagnosis not present

## 2021-02-21 DIAGNOSIS — Z0000 Encounter for general adult medical examination without abnormal findings: Secondary | ICD-10-CM

## 2021-02-21 DIAGNOSIS — E785 Hyperlipidemia, unspecified: Secondary | ICD-10-CM

## 2021-02-21 DIAGNOSIS — E039 Hypothyroidism, unspecified: Secondary | ICD-10-CM

## 2021-02-21 DIAGNOSIS — N289 Disorder of kidney and ureter, unspecified: Secondary | ICD-10-CM

## 2021-02-21 DIAGNOSIS — R700 Elevated erythrocyte sedimentation rate: Secondary | ICD-10-CM

## 2021-02-21 DIAGNOSIS — Z7289 Other problems related to lifestyle: Secondary | ICD-10-CM | POA: Diagnosis not present

## 2021-02-21 DIAGNOSIS — L989 Disorder of the skin and subcutaneous tissue, unspecified: Secondary | ICD-10-CM

## 2021-02-21 NOTE — Assessment & Plan Note (Signed)
Hydrate and monitor 

## 2021-02-21 NOTE — Assessment & Plan Note (Signed)
Recently seen by Allyson Sabal and one spot was subjected to Cryotherapy and will see them in a year

## 2021-02-21 NOTE — Assessment & Plan Note (Addendum)
Is managing OK despite her stress being up with her husband's heart disease getting notably worse this year with significant CHF. She does not feel she needs any medication or counseling changes at this time.

## 2021-02-21 NOTE — Assessment & Plan Note (Signed)
Continue to monitor

## 2021-02-21 NOTE — Patient Instructions (Addendum)
Call Dr Sabra Heck for pelvic exam   Shingrix is the new shingles shot, 2 shots over 2-6 months, confirm coverage with insurance and document, then can return here for shots with nurse appt or at pharmacy  Fish or Javier Docker or Flaxseed oil caps a day Preventive Care 48-55 Years Old, Female Preventive care refers to lifestyle choices and visits with your health care provider that can promote health and wellness. This includes:  A yearly physical exam. This is also called an annual wellness visit.  Regular dental and eye exams.  Immunizations.  Screening for certain conditions.  Healthy lifestyle choices, such as: ? Eating a healthy diet. ? Getting regular exercise. ? Not using drugs or products that contain nicotine and tobacco. ? Limiting alcohol use. What can I expect for my preventive care visit? Physical exam Your health care provider will check your:  Height and weight. These may be used to calculate your BMI (body mass index). BMI is a measurement that tells if you are at a healthy weight.  Heart rate and blood pressure.  Body temperature.  Skin for abnormal spots. Counseling Your health care provider may ask you questions about your:  Past medical problems.  Family's medical history.  Alcohol, tobacco, and drug use.  Emotional well-being.  Home life and relationship well-being.  Sexual activity.  Diet, exercise, and sleep habits.  Work and work Statistician.  Access to firearms.  Method of birth control.  Menstrual cycle.  Pregnancy history. What immunizations do I need? Vaccines are usually given at various ages, according to a schedule. Your health care provider will recommend vaccines for you based on your age, medical history, and lifestyle or other factors, such as travel or where you work.   What tests do I need? Blood tests  Lipid and cholesterol levels. These may be checked every 5 years, or more often if you are over 72 years old.  Hepatitis C  test.  Hepatitis B test. Screening  Lung cancer screening. You may have this screening every year starting at age 28 if you have a 30-pack-year history of smoking and currently smoke or have quit within the past 15 years.  Colorectal cancer screening. ? All adults should have this screening starting at age 23 and continuing until age 4. ? Your health care provider may recommend screening at age 37 if you are at increased risk. ? You will have tests every 1-10 years, depending on your results and the type of screening test.  Diabetes screening. ? This is done by checking your blood sugar (glucose) after you have not eaten for a while (fasting). ? You may have this done every 1-3 years.  Mammogram. ? This may be done every 1-2 years. ? Talk with your health care provider about when you should start having regular mammograms. This may depend on whether you have a family history of breast cancer.  BRCA-related cancer screening. This may be done if you have a family history of breast, ovarian, tubal, or peritoneal cancers.  Pelvic exam and Pap test. ? This may be done every 3 years starting at age 55. ? Starting at age 21, this may be done every 5 years if you have a Pap test in combination with an HPV test. Other tests  STD (sexually transmitted disease) testing, if you are at risk.  Bone density scan. This is done to screen for osteoporosis. You may have this scan if you are at high risk for osteoporosis. Talk with your health care  provider about your test results, treatment options, and if necessary, the need for more tests. Follow these instructions at home: Eating and drinking  Eat a diet that includes fresh fruits and vegetables, whole grains, lean protein, and low-fat dairy products.  Take vitamin and mineral supplements as recommended by your health care provider.  Do not drink alcohol if: ? Your health care provider tells you not to drink. ? You are pregnant, may be  pregnant, or are planning to become pregnant.  If you drink alcohol: ? Limit how much you have to 0-1 drink a day. ? Be aware of how much alcohol is in your drink. In the [place] one drink equals one 12 oz bottle of beer (355 mL), one 5 oz glass of wine (148 mL), or one 1 oz glass of hard liquor (44 mL).   Lifestyle  Take daily care of your teeth and gums. Brush your teeth every morning and night with fluoride toothpaste. Floss one time each day.  Stay active. Exercise for at least 30 minutes 5 or more days each week.  Do not use any products that contain nicotine or tobacco, such as cigarettes, e-cigarettes, and chewing tobacco. If you need help quitting, ask your health care provider.  Do not use drugs.  If you are sexually active, practice safe sex. Use a condom or other form of protection to prevent STIs (sexually transmitted infections).  If you do not wish to become pregnant, use a form of birth control. If you plan to become pregnant, see your health care provider for a prepregnancy visit.  If told by your health care provider, take low-dose aspirin daily starting at age 24.  Find healthy ways to cope with stress, such as: ? Meditation, yoga, or listening to music. ? Journaling. ? Talking to a trusted person. ? Spending time with friends and family. Safety  Always wear your seat belt while driving or riding in a vehicle.  Do not drive: ? If you have been drinking alcohol. Do not ride with someone who has been drinking. ? When you are tired or distracted. ? While texting.  Wear a helmet and other protective equipment during sports activities.  If you have firearms in your house, make sure you follow all gun safety procedures. What's next?  Visit your health care provider once a year for an annual wellness visit.  Ask your health care provider how often you should have your eyes and teeth checked.  Stay up to date on all vaccines. This information is not intended to  replace advice given to you by your health care provider. Make sure you discuss any questions you have with your health care provider. Document Revised: 07/31/2020 Document Reviewed: 07/08/2018 Elsevier Patient Education  2021 [employer].

## 2021-02-21 NOTE — Progress Notes (Signed)
Patient ID: Peggy Thomas, female    DOB: Jan 05, 1966  Age: 55 y.o. MRN: [id number]    Subjective:  Subjective  HPI Jessicah Croll presents for comprehensive physical exam today and follow up on management of chronic concerns. She reports that she went in for a skin cancer screening at [facility] dermatology and pre-cancerous lesion spots were found near her shoulder area. She denies any chest pain, SOB, fever, abdominal pain, cough, chills, sore throat, dysuria, urinary incontinence, back pain, HA, or N/VD. She states that she has received the cologuard, but did not send it back for results. She is still thinking about getting a colonoscopy, but have not decided at the moment. She reports that her husband has been dx with CHF. She states that he has FMHx of heart disease and his first MI was at the age of 53. She states that at first she was troubled by the news, but with time she is getting better. She states that she does yard work for physical activity.   Review of Systems  Constitutional: Negative for chills, fatigue and fever.  HENT: Negative for congestion, rhinorrhea, sinus pressure, sinus pain and sore throat.   Eyes: Negative for pain.  Respiratory: Negative for cough and shortness of breath.   Cardiovascular: Negative for chest pain, palpitations and leg swelling.  Gastrointestinal: Negative for abdominal pain, blood in stool, diarrhea, nausea and vomiting.  Genitourinary: Negative for decreased urine volume, flank pain, frequency, vaginal bleeding and vaginal discharge.  Musculoskeletal: Negative for back pain.  Skin:       (+) pre-cancerous lesions local to shoulders  Neurological: Negative for headaches.    History Past Medical History:  Diagnosis Date  . Allergic state 05/01/2017  . Anemia 03/31/2015  . Aneurysm ([place])    on spleen-watching   . Chronic idiopathic thrombocytopenia (HCC)    MILD--   HEMATOLOGIST--  DR Marin Olp  . Depression 04/30/2011  . History of  kidney stones   . History of tobacco abuse 04/30/2011  . Hyperglycemia 07/30/2017  . Hyperlipidemia 07/30/2017  . Hypocalcemia   . Hypothyroidism 04/30/2011  . Nausea without vomiting 03/26/2015  . Obesity   . PONV (postoperative nausea and vomiting)   . Renal insufficiency   . Right ureteral stone   . Sinusitis 04/30/2017    She has a past surgical history that includes Cholecystectomy (2000); Cesarean section (1996 and 1998); Tubal ligation (1999); Knee arthroscopy; Robotic assisted total hysterectomy (10-15-2010); Extracorporeal shock wave lithotripsy (2003); Cystoscopy with retrograde pyelogram, ureteroscopy and stent placement (Right, 07/25/2013); Abdominal hysterectomy; embolization (N/A, 12/26/2014); Cardiac catheterization (12/26/2014); Video bronchoscopy (N/A, 02/06/2015); and Video assisted thoracoscopy (vats)/empyema (Left, 02/06/2015).   Her family history includes Alcohol abuse in her maternal grandmother; Alzheimer's disease in her paternal grandmother; Aneurysm in her maternal grandmother; Asthma in her mother; Cancer in her father and mother; Decreased libido in her mother; Dementia in her mother; Diabetes in her mother; Mental illness in her sister; Other in her maternal grandfather; Renal Disease in her mother; Stroke in her maternal aunt and maternal grandmother.She reports that she has been smoking cigarettes. She has a 22.50 pack-year smoking history. She has never used smokeless tobacco. She reports current alcohol use. She reports that she does not use drugs.  Current Outpatient Medications on File Prior to Visit  Medication Sig Dispense Refill  . cyclobenzaprine (FLEXERIL) 10 MG tablet Take 0.5-1 tablets (5-10 mg total) by mouth at bedtime as needed for muscle spasms. 30 tablet 1  . famotidine (PEPCID)  40 MG tablet TAKE 1 TABLET BY MOUTH EVERYDAY AT BEDTIME 30 tablet 5  . ibuprofen (ADVIL) 400 MG tablet TAKE 1 TABLET (400 MG TOTAL) BY MOUTH EVERY 6 (SIX) HOURS AS NEEDED. 90 tablet  1  . levothyroxine (SYNTHROID) 88 MCG tablet Take 1 tablet (88 mcg total) by mouth daily before breakfast. 90 tablet 0   No current facility-administered medications on file prior to visit.     Objective:  Objective  Physical Exam Constitutional:      General: She is not in acute distress.    Appearance: Normal appearance. She is not ill-appearing or toxic-appearing.  HENT:     Head: Normocephalic and atraumatic.     Right Ear: Tympanic membrane, ear canal and external ear normal.     Left Ear: Tympanic membrane, ear canal and external ear normal.     Nose: No congestion or rhinorrhea.  Eyes:     Extraocular Movements: Extraocular movements intact.     Right eye: No nystagmus.     Left eye: No nystagmus.     Pupils: Pupils are equal, round, and reactive to light.  Cardiovascular:     Rate and Rhythm: Normal rate and regular rhythm.     Pulses: Normal pulses.     Heart sounds: Normal heart sounds. No murmur heard.   Pulmonary:     Effort: Pulmonary effort is normal. No respiratory distress.     Breath sounds: Normal breath sounds. No wheezing, rhonchi or rales.  Abdominal:     General: Bowel sounds are normal.     Palpations: Abdomen is soft. There is no mass.     Tenderness: There is no abdominal tenderness. There is no guarding.     Hernia: No hernia is present.  Musculoskeletal:        General: Normal range of motion.     Cervical back: Normal range of motion and neck supple.  Skin:    General: Skin is warm and dry.  Neurological:     Mental Status: She is alert and oriented to person, place, and time.  Psychiatric:        Behavior: Behavior normal.    BP 102/68   Pulse 72   Temp 98.1 F (36.7 C)   Resp 16   Ht 5\' 1"  (1.549 m)   Wt 207 lb (93.9 kg)   LMP 08/10/2010   SpO2 99%   BMI 39.11 kg/m  Wt Readings from Last 3 Encounters:  02/21/21 207 lb (93.9 kg)  08/16/20 205 lb (93 kg)  02/14/20 206 lb 9.6 oz (93.7 kg)     Lab Results  Component Value  Date   WBC 5.4 02/14/2021   HGB 15.4 02/14/2021   HCT 46.7 (H) 02/14/2021   PLT 148 02/14/2021   GLUCOSE 95 02/14/2021   CHOL 176 02/14/2021   TRIG 168 (H) 02/14/2021   HDL 39 (L) 02/14/2021   LDLDIRECT 121.0 05/22/2020   LDLCALC 109 (H) 02/14/2021   ALT 23 02/14/2021   AST 19 02/14/2021   NA 140 02/14/2021   K 4.3 02/14/2021   CL 106 02/14/2021   CREATININE 0.90 02/14/2021   BUN 13 02/14/2021   CO2 26 02/14/2021   TSH 3.30 02/14/2021   INR 1.01 05/04/2017   HGBA1C 5.5 02/14/2021    MM DIAG BREAST TOMO BILATERAL  Result Date: 02/18/2021 CLINICAL DATA:  One year follow-up of a probable benign mass in the outer left breast with no sonographic correlate. EXAM: DIGITAL DIAGNOSTIC BILATERAL MAMMOGRAM  WITH TOMOSYNTHESIS AND CAD TECHNIQUE: Bilateral digital diagnostic mammography and breast tomosynthesis was performed. The images were evaluated with computer-aided detection. COMPARISON:  Previous exam(s). ACR Breast Density Category b: There are scattered areas of fibroglandular density. FINDINGS: The small mass in the lateral aspect of the left breast is stable compared to the prior exam dated 02/17/2020. No suspicious mass or malignant type microcalcifications identified in either breast. IMPRESSION: Stable probable benign mass in the left breast. RECOMMENDATION: Bilateral diagnostic mammogram in 1 year is recommended. I have discussed the findings and recommendations with the patient. If applicable, a reminder letter will be sent to the patient regarding the next appointment. BI-RADS CATEGORY  3: Probably benign. Electronically Signed   By: Lillia Mountain M.D.   On: 02/18/2021 11:11     Assessment & Plan:  Plan    No orders of the defined types were placed in this encounter.   Problem List Items Addressed This Visit    Depression    Is managing OK despite her stress being up with her husband's heart disease getting notably worse this year with significant CHF. She does not feel she  needs any medication or counseling changes at this time.       Renal insufficiency    Hydrate and monitor      Relevant Orders   Comprehensive metabolic panel   Hypothyroidism    On Levothyroxine, continue to monitor      Elevated sed rate    Continue to monitor      Relevant Orders   CBC   Sedimentation rate   Hyperglycemia    hgba1c acceptable, minimize simple carbs. Increase exercise as tolerated.      Relevant Orders   Hemoglobin A1c   TSH   Hyperlipidemia    Encouraged heart healthy diet, increase exercise, avoid trans fats, consider a krill oil cap daily      Relevant Orders   Lipid panel   TSH   Preventative health care - Primary    Patient encouraged to maintain heart healthy diet, regular exercise, adequate sleep. Consider daily probiotics. Take medications as prescribed. Labs ordered and reviewed. She has not completed her cologuard we ordered. She will consider colonoscopy vs cologuard and proceed with one of them this year.       Relevant Orders   CBC   TSH   Precancerous skin lesion    Recently seen by Allyson Sabal and one spot was subjected to Cryotherapy and will see them in a year       Other Visit Diagnoses    Other problems related to lifestyle       Relevant Orders   Hepatitis C Antibody     Mammo: last completed on 02/18/2021, stable probable benign mass in left breast, repeat every year.    Follow-up: Return in about 6 months (around 08/23/2021), or lab appt on 08/23/21, f/u after that.   I,David Hanna,acting as a scribe for Penni Homans, MD.,have documented all relevant documentation on the behalf of Penni Homans, MD,as directed by  Penni Homans, MD while in the presence of Penni Homans, MD.  I, Mosie Lukes, MD personally performed the services described in this documentation. All medical record entries made by the scribe were at my direction and in my presence. I have reviewed the chart and agree that the record reflects my personal  performance and is accurate and complete

## 2021-02-21 NOTE — Assessment & Plan Note (Addendum)
Patient encouraged to maintain heart healthy diet, regular exercise, adequate sleep. Consider daily probiotics. Take medications as prescribed. Labs ordered and reviewed. She has not completed her cologuard we ordered. She will consider colonoscopy vs cologuard and proceed with one of them this year.

## 2021-02-22 NOTE — Assessment & Plan Note (Signed)
Encouraged heart healthy diet, increase exercise, avoid trans fats, consider a krill oil cap daily 

## 2021-02-22 NOTE — Assessment & Plan Note (Signed)
hgba1c acceptable, minimize simple carbs. Increase exercise as tolerated.  

## 2021-02-22 NOTE — Assessment & Plan Note (Signed)
On Levothyroxine, continue to monitor 

## 2021-04-04 ENCOUNTER — Other Ambulatory Visit: Payer: Self-pay | Admitting: Family Medicine

## 2021-06-30 NOTE — Progress Notes (Addendum)
[facility] [place], [place], [place] [address] 336 [id number] [phone]  Date:  07/01/2021   Name:  Peggy Thomas   DOB:  10-30-1966   MRN:  [id number]  PCP:  Mosie Lukes, MD    Chief Complaint: Dysuria (Pressure, urgency, burning, stabbing pain, no fever, slight back pain, hx of kidney stone, sxs started one week and a half ago)   History of Present Illness:  Peggy Thomas is a 55 y.o. very pleasant female patient who presents with the following:  Primary patient of Dr. Charlett Blake, here today with concern of urinary tract infection I have seen her once in the past, in 2017 History of hypothyroidism, hyperlipidemia, chronic idiopathic thrombocytopenia  Today she notes symptoms of urinary urgency, burning and pressure Sx for about a week No hematuria that she noticed She has been using azo No fever, no abd pain She has some lower back pain but this is not a new thing for her She did have history of kidney stone in the past -most recent kidney stone about 8 years ago No vomiting  She does not menstruate any longer  Patient Active Problem List   Diagnosis Date Noted   Precancerous skin lesion 02/21/2021   Preventative health care 02/15/2020   Back pain 02/15/2020   Acid reflux 09/15/2018   Pharyngitis 09/14/2018   Atypical chest pain 09/06/2017   Hyperglycemia 07/30/2017   Hyperlipidemia 07/30/2017   Allergic state 05/01/2017   Sinusitis 04/30/2017   Right knee pain 07/17/2015   Hereditary and idiopathic peripheral neuropathy 07/01/2015   Elevated sed rate 07/01/2015   Anemia 03/31/2015   Right shoulder pain 03/30/2015   Nausea without vomiting 03/26/2015   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/2015   S/P lung surgery, follow-up exam    Empyema ([place]) 02/06/2015   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   Dyspnea    Pleural effusion on left    Splenic abscess 01/31/2015   Vaginal pain  12/03/2014   Toe pain, right 04/03/2014   Edema 04/03/2014   Renal insufficiency 04/03/2014   Elevated blood pressure reading 04/03/2014   Hypothyroidism 04/03/2014   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   Thrombocytopenia ([place]) 05/29/2011   Depression 04/30/2011   Tobacco use disorder 04/30/2011   Uterus, adenomyosis 04/30/2011   Fatigue    Hypocalcemia    Obesity     Past Medical History:  Diagnosis Date   Allergic state 05/01/2017   Anemia 03/31/2015   Aneurysm ([place])    on spleen-watching    Chronic idiopathic thrombocytopenia (HCC)    MILD--   HEMATOLOGIST--  DR Marin Olp   Depression 04/30/2011   History of kidney stones    History of tobacco abuse 04/30/2011   Hyperglycemia 07/30/2017   Hyperlipidemia 07/30/2017   Hypocalcemia    Hypothyroidism 04/30/2011   Nausea without vomiting 03/26/2015   Obesity    PONV (postoperative nausea and vomiting)    Renal insufficiency    Right ureteral stone    Sinusitis 04/30/2017    Past Surgical History:  Procedure Laterality Date   ABDOMINAL HYSTERECTOMY     [place] and [place], URETEROSCOPY AND STENT PLACEMENT Right 07/25/2013   Procedure: CYSTOSCOPY WITH RETROGRADE PYELOGRAM, URETEROSCOPY, STONE EXTRACTION AND POSSIBLE STENT PLACEMENT;  Surgeon: Ailene Rud, MD;  Location: [facility];  Service: Urology;  Laterality: Right;  EMBOLIZATION N/A 12/26/2014   Procedure: EMBOLIZATION;  Surgeon: Serafina Mitchell, MD;  Location: [facility] CATH LAB;  Service: Cardiovascular;  Laterality: N/A;   EXTRACORPOREAL SHOCK WAVE LITHOTRIPSY  2003   KNEE ARTHROSCOPY     PERIPHERAL VASCULAR CATHETERIZATION  12/26/2014   Procedure: VISCERAL ANGIOGRAPHY;  Surgeon: Serafina Mitchell, MD;  Location: [facility] CATH LAB;  Service: Cardiovascular;;   ROBOTIC ASSISTED TOTAL HYSTERECTOMY  10-15-2010   LAPAROSCOPIC /  LYSIS ADHESIONS   TUBAL LIGATION  1999   VIDEO ASSISTED  THORACOSCOPY (VATS)/EMPYEMA Left 02/06/2015   Procedure: LEFT VIDEO ASSISTED THORACOSCOPY (VATS)/DRAINAGE OF EMPYEMA;  Surgeon: Grace Isaac, MD;  Location: [facility];  Service: Thoracic;  Laterality: Left;  (L)VATS, DRAINAGE OF EMPYEMA   VIDEO BRONCHOSCOPY N/A 02/06/2015   Procedure: VIDEO BRONCHOSCOPY;  Surgeon: Grace Isaac, MD;  Location: [facility] OR;  Service: Thoracic;  Laterality: N/A;    Social History   Tobacco Use   Smoking status: Every Day    Packs/day: 0.75    Years: 30.00    Pack years: 22.50    Types: Cigarettes   Smokeless tobacco: Never  Substance Use Topics   Alcohol use: Yes    Alcohol/week: 0.0 standard drinks    Comment: occasionally/socially   Drug use: No    Family History  Problem Relation Age of Onset   Diabetes Mother        type 2   Asthma Mother    Cancer Mother        skin cancer   Renal Disease Mother    Decreased libido Mother    Dementia Mother    Cancer Father        bone   Mental illness Sister        schizophrenia   Alcohol abuse Maternal Grandmother    Stroke Maternal Grandmother    Aneurysm Maternal Grandmother        brain aneurysm   Other Maternal Grandfather        black lung   Alzheimer's disease Paternal Grandmother    Stroke Maternal Aunt     No Known Allergies  Medication list has been reviewed and updated.  Current Outpatient Medications on File Prior to Visit  Medication Sig Dispense Refill   cyclobenzaprine (FLEXERIL) 10 MG tablet Take 0.5-1 tablets (5-10 mg total) by mouth at bedtime as needed for muscle spasms. 30 tablet 1   famotidine (PEPCID) 40 MG tablet TAKE 1 TABLET BY MOUTH EVERYDAY AT BEDTIME 30 tablet 5   ibuprofen (ADVIL) 400 MG tablet TAKE 1 TABLET (400 MG TOTAL) BY MOUTH EVERY 6 (SIX) HOURS AS NEEDED. 90 tablet 1   levothyroxine (SYNTHROID) 88 MCG tablet TAKE 1 TABLET BY MOUTH DAILY BEFORE BREAKFAST. 90 tablet 1   No current facility-administered medications on file prior to visit.    Review of  Systems:  As per HPI- otherwise negative.   Physical Examination: Vitals:   07/01/21 1111  BP: 118/76  Pulse: 76  Resp: 16  Temp: (!) 97.4 F (36.3 C)  SpO2: 99%   Vitals:   07/01/21 1111  Weight: 207 lb (93.9 kg)  Height: '5\' 1"'$  (1.549 m)   Body mass index is 39.11 kg/m. Ideal Body Weight: Weight in (lb) to have BMI = 25: 132  GEN: no acute distress.  Obese, looks well HEENT: Atraumatic, Normocephalic.  Ears and Nose: No external deformity. CV: RRR, No M/G/R. No JVD. No thrill. No extra heart sounds. PULM: CTA B, no wheezes, crackles, rhonchi. No  retractions. No resp. distress. No accessory muscle use. ABD: S, NT, ND, +BS. No rebound. No HSM.  Belly is benign, no CVA tenderness EXTR: No c/c/e PSYCH: Normally interactive. Conversant.   Results for orders placed or performed in visit on 07/01/21  POCT urinalysis dipstick  Result Value Ref Range   Color, UA yellow yellow   Clarity, UA cloudy (A) clear   Glucose, UA negative negative mg/dL   Bilirubin, UA negative negative   Ketones, POC UA negative negative mg/dL   Spec Grav, UA 1.015 1.010 - 1.025   Blood, UA small (A) negative   pH, UA 6.0 5.0 - 8.0   Protein Ur, POC trace (A) negative mg/dL   Urobilinogen, UA 0.2 0.2 or 1.0 E.U./dL   Nitrite, UA Negative Negative   Leukocytes, UA Trace (A) Negative    Assessment and Plan: Dysuria - Plan: Urine Culture, POCT urinalysis dipstick, nitrofurantoin, macrocrystal-monohydrate, (MACROBID) 100 MG capsule  Hematuria, unspecified type - Plan: Urine Microscopic Only, nitrofurantoin, macrocrystal-monohydrate, (MACROBID) 100 MG capsule Patient today with no suggestive of UTI.  She does have microhematuria.  I started her on Macrobid, urine culture and micro pending.  If urine culture negative consider kidney stone  She is advised to alert me if not doing better in the next day or so, sooner if worse  This visit occurred during the SARS-CoV-2 public health emergency.  Safety  protocols were in place, including screening questions prior to the visit, additional usage of staff PPE, and extensive cleaning of exam room while observing appropriate contact time as indicated for disinfecting solutions.   Signed Lamar Blinks, MD  Addnd 8/24- received her urine culture as below, message to pt  Results for orders placed or performed in visit on 07/01/21  Urine Culture   Specimen: Urine  Result Value Ref Range   MICRO NUMBER: [id number]    SPECIMEN QUALITY: Adequate    Sample Source NOT GIVEN    STATUS: FINAL    ISOLATE 1: Escherichia coli (A)       Susceptibility   Escherichia coli - URINE CULTURE, REFLEX    AMOX/CLAVULANIC <=2 Sensitive     AMPICILLIN <=2 Sensitive     AMPICILLIN/SULBACTAM <=2 Sensitive     CEFAZOLIN* <=4 Not Reportable      * For infections other than uncomplicated UTI caused by E. coli, K. pneumoniae or P. mirabilis: Cefazolin is resistant if MIC > or = 8 mcg/mL. (Distinguishing susceptible versus intermediate for isolates with MIC < or = 4 mcg/mL requires additional testing.) For uncomplicated UTI caused by E. coli, K. pneumoniae or P. mirabilis: Cefazolin is susceptible if MIC <32 mcg/mL and predicts susceptible to the oral agents cefaclor, cefdinir, cefpodoxime, cefprozil, cefuroxime, cephalexin and loracarbef.     CEFTAZIDIME <=1 Sensitive     CEFEPIME <=1 Sensitive     CEFTRIAXONE <=1 Sensitive     CIPROFLOXACIN <=0.25 Sensitive     LEVOFLOXACIN <=0.12 Sensitive     GENTAMICIN <=1 Sensitive     IMIPENEM <=0.25 Sensitive     NITROFURANTOIN <=16 Sensitive     PIP/TAZO <=4 Sensitive     TOBRAMYCIN <=1 Sensitive     TRIMETH/SULFA* <=20 Sensitive      * For infections other than uncomplicated UTI caused by E. coli, K. pneumoniae or P. mirabilis: Cefazolin is resistant if MIC > or = 8 mcg/mL. (Distinguishing susceptible versus intermediate for isolates with MIC < or = 4 mcg/mL requires additional testing.) For uncomplicated  UTI caused by E. coli,  K. pneumoniae or P. mirabilis: Cefazolin is susceptible if MIC <32 mcg/mL and predicts susceptible to the oral agents cefaclor, cefdinir, cefpodoxime, cefprozil, cefuroxime, cephalexin and loracarbef. Legend: S = Susceptible  I = Intermediate R = Resistant  NS = Not susceptible * = Not tested  NR = Not reported **NN = See antimicrobic comments   Urine Microscopic Only  Result Value Ref Range   WBC, UA 0-2/hpf 0-2/hpf   RBC / HPF 3-6/hpf (A) 0-2/hpf   Mucus, UA Presence of (A) None   Squamous Epithelial / LPF Few(5-10/hpf) (A) Rare(0-4/hpf)   Bacteria, UA Few(10-50/hpf) (A) None  POCT urinalysis dipstick  Result Value Ref Range   Color, UA yellow yellow   Clarity, UA cloudy (A) clear   Glucose, UA negative negative mg/dL   Bilirubin, UA negative negative   Ketones, POC UA negative negative mg/dL   Spec Grav, UA 1.015 1.010 - 1.025   Blood, UA small (A) negative   pH, UA 6.0 5.0 - 8.0   Protein Ur, POC trace (A) negative mg/dL   Urobilinogen, UA 0.2 0.2 or 1.0 E.U./dL   Nitrite, UA Negative Negative   Leukocytes, UA Trace (A) Negative

## 2021-07-01 ENCOUNTER — Ambulatory Visit: Payer: Managed Care, Other (non HMO) | Admitting: Family Medicine

## 2021-07-01 ENCOUNTER — Other Ambulatory Visit: Payer: Self-pay

## 2021-07-01 VITALS — BP 118/76 | HR 76 | Temp 97.4°F | Resp 16 | Ht 61.0 in | Wt 207.0 lb

## 2021-07-01 DIAGNOSIS — R300 Dysuria: Secondary | ICD-10-CM | POA: Diagnosis not present

## 2021-07-01 DIAGNOSIS — R319 Hematuria, unspecified: Secondary | ICD-10-CM | POA: Diagnosis not present

## 2021-07-01 LAB — URINALYSIS, MICROSCOPIC ONLY

## 2021-07-01 LAB — POCT URINALYSIS DIP (MANUAL ENTRY)
Bilirubin, UA: NEGATIVE
Glucose, UA: NEGATIVE mg/dL
Ketones, POC UA: NEGATIVE mg/dL
Nitrite, UA: NEGATIVE
Spec Grav, UA: 1.015 (ref 1.010–1.025)
Urobilinogen, UA: 0.2 E.U./dL
pH, UA: 6 (ref 5.0–8.0)

## 2021-07-01 MED ORDER — NITROFURANTOIN MONOHYD MACRO 100 MG PO CAPS: 100.0000 mg | ORAL_CAPSULE | Freq: Two times a day (BID) | ORAL | 0 refills | Status: DC

## 2021-07-01 NOTE — Patient Instructions (Signed)
It was good to see you today!  I will be in touch with your urine culture asap In the meantime drink plenty of fluids and use the macrobid twice a day Let me know if not improving 1-2 days- Sooner if worse.

## 2021-07-03 ENCOUNTER — Telehealth: Payer: Self-pay | Admitting: Family Medicine

## 2021-07-03 ENCOUNTER — Encounter: Payer: Self-pay | Admitting: Family Medicine

## 2021-07-03 LAB — URINE CULTURE
MICRO NUMBER:: 12273144
SPECIMEN QUALITY:: ADEQUATE

## 2021-07-03 NOTE — Telephone Encounter (Signed)
Pt aware.

## 2021-07-03 NOTE — Telephone Encounter (Signed)
PT was told that after seeing [facility] on Monday if she want feeling better she should call back. She states that her fever has been running 101.6-103  she's only on day 2 of antibiotics

## 2021-07-04 ENCOUNTER — Other Ambulatory Visit: Payer: Self-pay | Admitting: Family Medicine

## 2021-07-04 MED ORDER — SULFAMETHOXAZOLE-TRIMETHOPRIM 800-160 MG PO TABS: 1.0000 | ORAL_TABLET | Freq: Two times a day (BID) | ORAL | 0 refills | Status: DC

## 2021-07-04 NOTE — Telephone Encounter (Signed)
Patient called and still running a fever  What should we do   Please advise

## 2021-07-04 NOTE — Telephone Encounter (Signed)
Pt aware.

## 2021-08-19 ENCOUNTER — Other Ambulatory Visit (INDEPENDENT_AMBULATORY_CARE_PROVIDER_SITE_OTHER): Payer: Managed Care, Other (non HMO)

## 2021-08-19 ENCOUNTER — Other Ambulatory Visit: Payer: Self-pay

## 2021-08-19 DIAGNOSIS — N289 Disorder of kidney and ureter, unspecified: Secondary | ICD-10-CM

## 2021-08-19 DIAGNOSIS — R739 Hyperglycemia, unspecified: Secondary | ICD-10-CM | POA: Diagnosis not present

## 2021-08-19 DIAGNOSIS — Z0000 Encounter for general adult medical examination without abnormal findings: Secondary | ICD-10-CM

## 2021-08-19 DIAGNOSIS — E785 Hyperlipidemia, unspecified: Secondary | ICD-10-CM

## 2021-08-19 DIAGNOSIS — R700 Elevated erythrocyte sedimentation rate: Secondary | ICD-10-CM | POA: Diagnosis not present

## 2021-08-19 DIAGNOSIS — Z7289 Other problems related to lifestyle: Secondary | ICD-10-CM

## 2021-08-19 LAB — LIPID PANEL
Cholesterol: 179 mg/dL (ref 0–200)
HDL: 32.9 mg/dL — ABNORMAL LOW (ref >39.00)
NonHDL: 145.66
Total CHOL/HDL Ratio: 5
Triglycerides: 307 mg/dL — ABNORMAL HIGH (ref 0.0–149.0)
VLDL: 61.4 mg/dL — ABNORMAL HIGH (ref 0.0–40.0)

## 2021-08-19 LAB — COMPREHENSIVE METABOLIC PANEL
ALT: 27 U/L (ref 0–35)
AST: 23 U/L (ref 0–37)
Albumin: 4.1 g/dL (ref 3.5–5.2)
Alkaline Phosphatase: 65 U/L (ref 39–117)
BUN: 11 mg/dL (ref 6–23)
CO2: 28 mEq/L (ref 19–32)
Calcium: 9.2 mg/dL (ref 8.4–10.5)
Chloride: 104 mEq/L (ref 96–112)
Creatinine, Ser: 0.94 mg/dL (ref 0.40–1.20)
GFR: 68.52 mL/min (ref >60.00)
Glucose, Bld: 90 mg/dL (ref 70–99)
Potassium: 4.3 mEq/L (ref 3.5–5.1)
Sodium: 140 mEq/L (ref 135–145)
Total Bilirubin: 0.6 mg/dL (ref 0.2–1.2)
Total Protein: 6.9 g/dL (ref 6.0–8.3)

## 2021-08-19 LAB — LDL CHOLESTEROL, DIRECT: Direct LDL: 108 mg/dL

## 2021-08-19 LAB — CBC
HCT: 45.9 % (ref 36.0–46.0)
Hemoglobin: 15.5 g/dL — ABNORMAL HIGH (ref 12.0–15.0)
MCHC: 33.7 g/dL (ref 30.0–36.0)
MCV: 93.9 fl (ref 78.0–100.0)
Platelets: 133 10*3/uL — ABNORMAL LOW (ref 150.0–400.0)
RBC: 4.89 Mil/uL (ref 3.87–5.11)
RDW: 13 % (ref 11.5–15.5)
WBC: 6 10*3/uL (ref 4.0–10.5)

## 2021-08-19 LAB — HEMOGLOBIN A1C: Hgb A1c MFr Bld: 5.9 % (ref 4.6–6.5)

## 2021-08-19 LAB — SEDIMENTATION RATE: Sed Rate: 33 mm/hr — ABNORMAL HIGH (ref 0–30)

## 2021-08-19 LAB — TSH: TSH: 3.85 u[IU]/mL (ref 0.35–5.50)

## 2021-08-20 LAB — HEPATITIS C ANTIBODY
Hepatitis C Ab: NONREACTIVE
SIGNAL TO CUT-OFF: 0.07 (ref <1.00)

## 2021-08-21 ENCOUNTER — Other Ambulatory Visit: Payer: Self-pay

## 2021-08-21 DIAGNOSIS — E7800 Pure hypercholesterolemia, unspecified: Secondary | ICD-10-CM

## 2021-08-21 MED ORDER — ATORVASTATIN CALCIUM 10 MG PO TABS
10.0000 mg | ORAL_TABLET | Freq: Every day | ORAL | 3 refills | Status: DC
Start: 2021-08-21 — End: 2021-11-18

## 2021-08-26 ENCOUNTER — Encounter: Payer: Self-pay | Admitting: Family Medicine

## 2021-08-26 ENCOUNTER — Other Ambulatory Visit: Payer: Self-pay

## 2021-08-26 ENCOUNTER — Ambulatory Visit: Payer: PRIVATE HEALTH INSURANCE | Admitting: Family Medicine

## 2021-08-26 VITALS — BP 116/74 | HR 64 | Temp 98.5°F | Resp 16 | Wt 208.6 lb

## 2021-08-26 DIAGNOSIS — E039 Hypothyroidism, unspecified: Secondary | ICD-10-CM

## 2021-08-26 DIAGNOSIS — N289 Disorder of kidney and ureter, unspecified: Secondary | ICD-10-CM

## 2021-08-26 DIAGNOSIS — Z23 Encounter for immunization: Secondary | ICD-10-CM | POA: Diagnosis not present

## 2021-08-26 DIAGNOSIS — E785 Hyperlipidemia, unspecified: Secondary | ICD-10-CM

## 2021-08-26 DIAGNOSIS — E669 Obesity, unspecified: Secondary | ICD-10-CM

## 2021-08-26 DIAGNOSIS — R739 Hyperglycemia, unspecified: Secondary | ICD-10-CM

## 2021-08-26 DIAGNOSIS — F32A Depression, unspecified: Secondary | ICD-10-CM | POA: Diagnosis not present

## 2021-08-26 NOTE — Progress Notes (Signed)
Subjective:   By signing my name below, I, Zite Okoli, attest that this documentation has been prepared under the direction and in the presence of Mosie Lukes, MD. 08/26/2021    Patient ID: Peggy Thomas, female    DOB: 1966-01-14, 55 y.o.   MRN: [id number]  No chief complaint on file.   HPI Patient is in today for an office visit and 6 month f/u  She mentions she is doing well but her husband is having medical issues.  She has arthritis in her right hand and uses aleve to manage the pain but is looking for another medication.   She also started experiencing peripheral neuropathy after her dose of antibiotics and is worried about its progression. She bought a topical cream and it is providing relief.   She thinks she is doing well with her stress and has a handle on it. She does not think she needs any medication to help control her stress levels.  She is willing to get the flu vaccine today.  She smokes less than 1 pack a day.   Past Medical History:  Diagnosis Date   Allergic state 05/01/2017   Anemia 03/31/2015   Aneurysm ([place])    on spleen-watching    Chronic idiopathic thrombocytopenia (HCC)    MILD--   HEMATOLOGIST--  DR Marin Olp   Depression 04/30/2011   History of kidney stones    History of tobacco abuse 04/30/2011   Hyperglycemia 07/30/2017   Hyperlipidemia 07/30/2017   Hypocalcemia    Hypothyroidism 04/30/2011   Nausea without vomiting 03/26/2015   Obesity    PONV (postoperative nausea and vomiting)    Renal insufficiency    Right ureteral stone    Sinusitis 04/30/2017    Past Surgical History:  Procedure Laterality Date   ABDOMINAL HYSTERECTOMY     [facility], URETEROSCOPY AND STENT PLACEMENT Right 07/25/2013   Procedure: CYSTOSCOPY WITH RETROGRADE PYELOGRAM, URETEROSCOPY, STONE EXTRACTION AND POSSIBLE STENT PLACEMENT;  Surgeon: Ailene Rud, MD;  Location:  [facility];  Service: Urology;  Laterality: Right;   EMBOLIZATION N/A 12/26/2014   Procedure: EMBOLIZATION;  Surgeon: Serafina Mitchell, MD;  Location: [facility] CATH LAB;  Service: Cardiovascular;  Laterality: N/A;   EXTRACORPOREAL SHOCK WAVE LITHOTRIPSY  2003   KNEE ARTHROSCOPY     PERIPHERAL VASCULAR CATHETERIZATION  12/26/2014   Procedure: VISCERAL ANGIOGRAPHY;  Surgeon: Serafina Mitchell, MD;  Location: [facility] CATH LAB;  Service: Cardiovascular;;   ROBOTIC ASSISTED TOTAL HYSTERECTOMY  10-15-2010   LAPAROSCOPIC /  LYSIS ADHESIONS   TUBAL LIGATION  1999   VIDEO ASSISTED THORACOSCOPY (VATS)/EMPYEMA Left 02/06/2015   Procedure: LEFT VIDEO ASSISTED THORACOSCOPY (VATS)/DRAINAGE OF EMPYEMA;  Surgeon: Grace Isaac, MD;  Location: [place];  Service: Thoracic;  Laterality: Left;  (L)VATS, DRAINAGE OF EMPYEMA   VIDEO BRONCHOSCOPY N/A 02/06/2015   Procedure: VIDEO BRONCHOSCOPY;  Surgeon: Grace Isaac, MD;  Location: [facility] OR;  Service: Thoracic;  Laterality: N/A;    Family History  Problem Relation Age of Onset   Diabetes Mother        type 2   Asthma Mother    Cancer Mother        skin cancer   Renal Disease Mother    Decreased libido Mother    Dementia Mother    Cancer Father        bone   Mental illness Sister  schizophrenia   Alcohol abuse Maternal Grandmother    Stroke Maternal Grandmother    Aneurysm Maternal Grandmother        brain aneurysm   Other Maternal Grandfather        black lung   Alzheimer's disease Paternal Grandmother    Stroke Maternal Aunt     Social History   Socioeconomic History   Marital status: Married    Spouse name: Not on file   Number of children: Not on file   Years of education: Not on file   Highest education level: Not on file  Occupational History   Not on file  Tobacco Use   Smoking status: Every Day    Packs/day: 0.75    Years: 30.00    Pack years: 22.50    Types: Cigarettes   Smokeless tobacco: Never  Substance and Sexual  Activity   Alcohol use: Yes    Alcohol/week: 0.0 standard drinks    Comment: occasionally/socially   Drug use: No   Sexual activity: Yes    Partners: Male    Birth control/protection: Surgical    Comment: TLH/LOA  Other Topics Concern   Not on file  Social History Narrative   Not on file   Social Determinants of Health   Financial Resource Strain: Not on file  Food Insecurity: Not on file  Transportation Needs: Not on file  Physical Activity: Not on file  Stress: Not on file  Social Connections: Not on file  Intimate Partner Violence: Not on file    Outpatient Medications Prior to Visit  Medication Sig Dispense Refill   atorvastatin (LIPITOR) 10 MG tablet Take 1 tablet (10 mg total) by mouth daily. 30 tablet 3   cyclobenzaprine (FLEXERIL) 10 MG tablet Take 0.5-1 tablets (5-10 mg total) by mouth at bedtime as needed for muscle spasms. 30 tablet 1   famotidine (PEPCID) 40 MG tablet TAKE 1 TABLET BY MOUTH EVERYDAY AT BEDTIME 30 tablet 5   ibuprofen (ADVIL) 400 MG tablet TAKE 1 TABLET (400 MG TOTAL) BY MOUTH EVERY 6 (SIX) HOURS AS NEEDED. 90 tablet 1   levothyroxine (SYNTHROID) 88 MCG tablet TAKE 1 TABLET BY MOUTH DAILY BEFORE BREAKFAST. 90 tablet 1   sulfamethoxazole-trimethoprim (BACTRIM DS) 800-160 MG tablet Take 1 tablet by mouth 2 (two) times daily. 14 tablet 0   No facility-administered medications prior to visit.    Allergies  Allergen Reactions   Wellbutrin [Bupropion] Palpitations    palpitations    Review of Systems  Constitutional:  Negative for fever and malaise/fatigue.  HENT:  Negative for congestion.   Eyes:  Negative for redness.  Respiratory:  Negative for shortness of breath.   Cardiovascular:  Negative for chest pain, palpitations and leg swelling.  Gastrointestinal:  Negative for abdominal pain, blood in stool and nausea.  Genitourinary:  Negative for dysuria and frequency.  Musculoskeletal:  Negative for falls.  Skin:  Negative for rash.   Neurological:  Negative for dizziness, loss of consciousness and headaches.  Endo/Heme/Allergies:  Negative for polydipsia.  Psychiatric/Behavioral:  Negative for depression. The patient is not nervous/anxious.       Objective:    Physical Exam Constitutional:      General: She is not in acute distress.    Appearance: She is well-developed.  HENT:     Head: Normocephalic and atraumatic.  Eyes:     Conjunctiva/sclera: Conjunctivae normal.  Neck:     Thyroid: No thyromegaly.  Cardiovascular:     Rate and Rhythm: Normal  rate and regular rhythm.     Heart sounds: Normal heart sounds. No murmur heard. Pulmonary:     Effort: Pulmonary effort is normal. No respiratory distress.     Breath sounds: Normal breath sounds.  Abdominal:     General: Bowel sounds are normal. There is no distension.     Palpations: Abdomen is soft. There is no mass.     Tenderness: There is no abdominal tenderness.  Musculoskeletal:     Cervical back: Neck supple.  Lymphadenopathy:     Cervical: No cervical adenopathy.  Skin:    General: Skin is warm and dry.  Neurological:     Mental Status: She is alert and oriented to person, place, and time.  Psychiatric:        Behavior: Behavior normal.    BP 116/74   Pulse 64   Temp 98.5 F (36.9 C)   Resp 16   Wt 208 lb 9.6 oz (94.6 kg)   LMP 08/10/2010   SpO2 97%   BMI 39.41 kg/m  Wt Readings from Last 3 Encounters:  08/26/21 208 lb 9.6 oz (94.6 kg)  07/01/21 207 lb (93.9 kg)  02/21/21 207 lb (93.9 kg)    Diabetic Foot Exam - Simple   No data filed    Lab Results  Component Value Date   WBC 6.0 08/19/2021   HGB 15.5 (H) 08/19/2021   HCT 45.9 08/19/2021   PLT 133.0 (L) 08/19/2021   GLUCOSE 90 08/19/2021   CHOL 179 08/19/2021   TRIG 307.0 (H) 08/19/2021   HDL 32.90 (L) 08/19/2021   LDLDIRECT 108.0 08/19/2021   LDLCALC 109 (H) 02/14/2021   ALT 27 08/19/2021   AST 23 08/19/2021   NA 140 08/19/2021   K 4.3 08/19/2021   CL 104  08/19/2021   CREATININE 0.94 08/19/2021   BUN 11 08/19/2021   CO2 28 08/19/2021   TSH 3.85 08/19/2021   INR 1.01 05/04/2017   HGBA1C 5.9 08/19/2021    Lab Results  Component Value Date   TSH 3.85 08/19/2021   Lab Results  Component Value Date   WBC 6.0 08/19/2021   HGB 15.5 (H) 08/19/2021   HCT 45.9 08/19/2021   MCV 93.9 08/19/2021   PLT 133.0 (L) 08/19/2021   Lab Results  Component Value Date   NA 140 08/19/2021   K 4.3 08/19/2021   CO2 28 08/19/2021   GLUCOSE 90 08/19/2021   BUN 11 08/19/2021   CREATININE 0.94 08/19/2021   BILITOT 0.6 08/19/2021   ALKPHOS 65 08/19/2021   AST 23 08/19/2021   ALT 27 08/19/2021   PROT 6.9 08/19/2021   ALBUMIN 4.1 08/19/2021   CALCIUM 9.2 08/19/2021   ANIONGAP 9 08/28/2017   GFR 68.52 08/19/2021   Lab Results  Component Value Date   CHOL 179 08/19/2021   Lab Results  Component Value Date   HDL 32.90 (L) 08/19/2021   Lab Results  Component Value Date   LDLCALC 109 (H) 02/14/2021   Lab Results  Component Value Date   TRIG 307.0 (H) 08/19/2021   Lab Results  Component Value Date   CHOLHDL 5 08/19/2021   Lab Results  Component Value Date   HGBA1C 5.9 08/19/2021       Assessment & Plan:   Problem List Items Addressed This Visit     Obesity    Encouraged DASH or MIND diet, decrease po intake and increase exercise as tolerated. Needs 7-8 hours of sleep nightly. Avoid trans fats, eat small,  frequent meals every 4-5 hours with lean proteins, complex carbs and healthy fats. Minimize simple carbs, high fat foods and processed foods. Consider [phone] or Saxenda      Depression    Very stressed due to her husband's worsening health with afib and CHF. Overall she feels you are managing will consider medications if worsening.       Renal insufficiency    Hydrate and monitor      Hypothyroidism    On Levothyroxine, continue to monitor      Hyperglycemia    hgba1c acceptable, minimize simple carbs. Increase exercise  as tolerated. Given flu shot today      Hyperlipidemia    Encourage heart healthy diet such as MIND or DASH diet, increase exercise, avoid trans fats, simple carbohydrates and processed foods, consider a krill or fish or flaxseed oil cap daily.       Other Visit Diagnoses     Need for influenza vaccination    -  Primary   Relevant Orders   Flu Vaccine QUAD 89mo+IM (Fluarix, Fluzone & Alfiuria Quad PF) (Completed)       No orders of the defined types were placed in this encounter.   I,Zite Okoli,acting as a Education administrator for Penni Homans, MD.,have documented all relevant documentation on the behalf of Penni Homans, MD,as directed by  Penni Homans, MD while in the presence of Penni Homans, MD.   I, Mosie Lukes, MD. , personally preformed the services described in this documentation.  All medical record entries made by the scribe were at my direction and in my presence.  I have reviewed the chart and discharge instructions (if applicable) and agree that the record reflects my personal performance and is accurate and complete. 08/26/2021

## 2021-08-26 NOTE — Assessment & Plan Note (Signed)
Hydrate and monitor 

## 2021-08-26 NOTE — Assessment & Plan Note (Signed)
Very stressed due to her husband's worsening health with afib and CHF. Overall she feels you are managing will consider medications if worsening.

## 2021-08-26 NOTE — Assessment & Plan Note (Signed)
Encouraged DASH or MIND diet, decrease po intake and increase exercise as tolerated. Needs 7-8 hours of sleep nightly. Avoid trans fats, eat small, frequent meals every 4-5 hours with lean proteins, complex carbs and healthy fats. Minimize simple carbs, high fat foods and processed foods. Consider Wegovy or Saxenda °

## 2021-08-26 NOTE — Assessment & Plan Note (Addendum)
hgba1c acceptable, minimize simple carbs. Increase exercise as tolerated. Given flu shot today 

## 2021-08-26 NOTE — Assessment & Plan Note (Signed)
Encourage heart healthy diet such as MIND or DASH diet, increase exercise, avoid trans fats, simple carbohydrates and processed foods, consider a krill or fish or flaxseed oil cap daily.  °

## 2021-08-26 NOTE — Assessment & Plan Note (Signed)
On Levothyroxine, continue to monitor 

## 2021-08-26 NOTE — Patient Instructions (Addendum)
Paxlovid and Molnupiravir the new COVID medication we can give you if you get COVID so make sure you test if you have symptoms because we have to treat by day 5 of symptoms for it to be effective. If you are positive let us know so we can treat. If a home test is negative and your symptoms are persistent get a PCR test. Can check testing locations at [email or web address] If you are positive we will make an appointment with [place] and we will send in Paxlovid or Molnupiravir if you would like it. Check with your pharmacy before we meet to confirm they have it in stock, if they do not then we can get the prescription at the [facility] or Saxenda shots for weight loss, check with insurance regarding  MIND or DASH Eating Plan [place] stands for Dietary Approaches to Stop Hypertension. The DASH eating plan is a healthy eating plan that has been shown to: Reduce high blood pressure (hypertension). Reduce your risk for type 2 diabetes, heart disease, and stroke. Help with weight loss. What are tips for following this plan? Reading food labels Check food labels for the amount of salt (sodium) per serving. Choose foods with less than 5 percent of the Daily Value of sodium. Generally, foods with less than 300 milligrams (mg) of sodium per serving fit into this eating plan. To find whole grains, look for the word "whole" as the first word in the ingredient list. Shopping Buy products labeled as "low-sodium" or "no salt added." Buy fresh foods. Avoid canned foods and pre-made or frozen meals. Cooking Avoid adding salt when cooking. Use salt-free seasonings or herbs instead of table salt or sea salt. Check with your health care provider or pharmacist before using salt substitutes. Do not fry foods. Cook foods using healthy methods such as baking, boiling, grilling, roasting, and broiling instead. Cook with heart-healthy oils, such as olive, canola, avocado, soybean, or sunflower oil. Meal  planning  Eat a balanced diet that includes: 4 or more servings of fruits and 4 or more servings of vegetables each day. Try to fill one-half of your plate with fruits and vegetables. 6-8 servings of whole grains each day. Less than 6 oz (170 g) of lean meat, poultry, or fish each day. A 3-oz (85-g) serving of meat is about the same size as a deck of cards. One egg equals 1 oz (28 g). 2-3 servings of low-fat dairy each day. One serving is 1 cup (237 mL). 1 serving of nuts, seeds, or beans 5 times each week. 2-3 servings of heart-healthy fats. Healthy fats called omega-3 fatty acids are found in foods such as walnuts, flaxseeds, fortified milks, and eggs. These fats are also found in cold-water fish, such as sardines, salmon, and mackerel. Limit how much you eat of: Canned or prepackaged foods. Food that is high in trans fat, such as some fried foods. Food that is high in saturated fat, such as fatty meat. Desserts and other sweets, sugary drinks, and other foods with added sugar. Full-fat dairy products. Do not salt foods before eating. Do not eat more than 4 egg yolks a week. Try to eat at least 2 vegetarian meals a week. Eat more home-cooked food and less restaurant, buffet, and fast food. Lifestyle When eating at a restaurant, ask that your food be prepared with less salt or no salt, if possible. If you drink alcohol: Limit how much you use to: 0-1 drink a day for women who  are not pregnant. 0-2 drinks a day for men. Be aware of how much alcohol is in your drink. In the [place] one drink equals one 12 oz bottle of beer (355 mL), one 5 oz glass of wine (148 mL), or one 1 oz glass of hard liquor (44 mL). General information Avoid eating more than 2,300 mg of salt a day. If you have hypertension, you may need to reduce your sodium intake to 1,500 mg a day. Work with your health care provider to maintain a healthy body weight or to lose weight. Ask what an ideal weight is for you. Get at  least 30 minutes of exercise that causes your heart to beat faster (aerobic exercise) most days of the week. Activities may include walking, swimming, or biking. Work with your health care provider or dietitian to adjust your eating plan to your individual calorie needs. What foods should I eat? Fruits All fresh, dried, or frozen fruit. Canned fruit in natural juice (without added sugar). Vegetables Fresh or frozen vegetables (raw, steamed, roasted, or grilled). Low-sodium or reduced-sodium tomato and vegetable juice. Low-sodium or reduced-sodium tomato sauce and tomato paste. Low-sodium or reduced-sodium canned vegetables. Grains Whole-grain or whole-wheat bread. Whole-grain or whole-wheat pasta. Brown rice. [place]. Bulgur. Whole-grain and low-sodium cereals. Pita bread. Low-fat, low-sodium crackers. Whole-wheat flour tortillas. Meats and other proteins Skinless chicken or [place]. Ground chicken or [place]. Pork with fat trimmed off. Fish and seafood. Egg whites. Dried beans, peas, or lentils. Unsalted nuts, nut butters, and seeds. Unsalted canned beans. Lean cuts of beef with fat trimmed off. Low-sodium, lean precooked or cured meat, such as sausages or meat loaves. Dairy Low-fat (1%) or fat-free (skim) milk. Reduced-fat, low-fat, or fat-free cheeses. Nonfat, low-sodium ricotta or cottage cheese. Low-fat or nonfat yogurt. Low-fat, low-sodium cheese. Fats and oils Soft margarine without trans fats. Vegetable oil. Reduced-fat, low-fat, or light mayonnaise and salad dressings (reduced-sodium). Canola, safflower, olive, avocado, soybean, and sunflower oils. Avocado. Seasonings and condiments Herbs. Spices. Seasoning mixes without salt. Other foods Unsalted popcorn and pretzels. Fat-free sweets. The items listed above may not be a complete list of foods and beverages you can eat. Contact a dietitian for more information. What foods should I avoid? Fruits Canned fruit in a light or heavy  syrup. Fried fruit. Fruit in cream or butter sauce. Vegetables Creamed or fried vegetables. Vegetables in a cheese sauce. Regular canned vegetables (not low-sodium or reduced-sodium). Regular canned tomato sauce and paste (not low-sodium or reduced-sodium). Regular tomato and vegetable juice (not low-sodium or reduced-sodium). Angie Fava. Olives. Grains Baked goods made with fat, such as croissants, muffins, or some breads. Dry pasta or rice meal packs. Meats and other proteins Fatty cuts of meat. Ribs. Fried meat. Berniece Salines. Bologna, salami, and other precooked or cured meats, such as sausages or meat loaves. Fat from the back of a pig (fatback). Bratwurst. Salted nuts and seeds. Canned beans with added salt. Canned or smoked fish. Whole eggs or egg yolks. Chicken or [place] with skin. Dairy Whole or 2% milk, cream, and half-and-half. Whole or full-fat cream cheese. Whole-fat or sweetened yogurt. Full-fat cheese. Nondairy creamers. Whipped toppings. Processed cheese and cheese spreads. Fats and oils Butter. Stick margarine. Lard. Shortening. Ghee. Bacon fat. Tropical oils, such as coconut, palm kernel, or palm oil. Seasonings and condiments Onion salt, garlic salt, seasoned salt, table salt, and sea salt. Worcestershire sauce. Tartar sauce. Barbecue sauce. Teriyaki sauce. Soy sauce, including reduced-sodium. Steak sauce. Canned and packaged gravies. Fish sauce. Oyster sauce. Cocktail  sauce. Store-bought horseradish. Ketchup. Mustard. Meat flavorings and tenderizers. Bouillon cubes. Hot sauces. Pre-made or packaged marinades. Pre-made or packaged taco seasonings. Relishes. Regular salad dressings. Other foods Salted popcorn and pretzels. The items listed above may not be a complete list of foods and beverages you should avoid. Contact a dietitian for more information. Where to find more information National Heart, Lung, and Blood Institute: https://wilson-eaton.com/ American Heart Association:  www.heart.org Academy of Nutrition and Dietetics: www.eatright.[place]: www.kidney.org Summary The DASH eating plan is a healthy eating plan that has been shown to reduce high blood pressure (hypertension). It may also reduce your risk for type 2 diabetes, heart disease, and stroke. When on the DASH eating plan, aim to eat more fresh fruits and vegetables, whole grains, lean proteins, low-fat dairy, and heart-healthy fats. With the DASH eating plan, you should limit salt (sodium) intake to 2,300 mg a day. If you have hypertension, you may need to reduce your sodium intake to 1,500 mg a day. Work with your health care provider or dietitian to adjust your eating plan to your individual calorie needs. This information is not intended to replace advice given to you by your health care provider. Make sure you discuss any questions you have with your health care provider. Document Revised: 09/30/2019 Document Reviewed: 09/30/2019 Elsevier Patient Education  2022 [employer].

## 2021-09-25 ENCOUNTER — Other Ambulatory Visit: Payer: Self-pay | Admitting: Family Medicine

## 2021-10-01 ENCOUNTER — Other Ambulatory Visit: Payer: Self-pay | Admitting: Family Medicine

## 2021-10-06 ENCOUNTER — Other Ambulatory Visit: Payer: Self-pay | Admitting: Family Medicine

## 2021-11-17 ENCOUNTER — Other Ambulatory Visit: Payer: Self-pay | Admitting: Family Medicine

## 2021-11-17 DIAGNOSIS — E7800 Pure hypercholesterolemia, unspecified: Secondary | ICD-10-CM

## 2022-01-09 ENCOUNTER — Other Ambulatory Visit: Payer: Self-pay | Admitting: Family Medicine

## 2022-01-09 DIAGNOSIS — N6320 Unspecified lump in the left breast, unspecified quadrant: Secondary | ICD-10-CM

## 2022-02-19 ENCOUNTER — Ambulatory Visit
Admission: RE | Admit: 2022-02-19 | Discharge: 2022-02-19 | Disposition: A | Discharge status: Home / Self Care | Payer: PRIVATE HEALTH INSURANCE | Source: Ambulatory Visit | Attending: Family Medicine | Admitting: Family Medicine

## 2022-02-19 DIAGNOSIS — N6320 Unspecified lump in the left breast, unspecified quadrant: Secondary | ICD-10-CM

## 2022-03-10 ENCOUNTER — Ambulatory Visit (INDEPENDENT_AMBULATORY_CARE_PROVIDER_SITE_OTHER): Payer: PRIVATE HEALTH INSURANCE | Admitting: Family Medicine

## 2022-03-10 ENCOUNTER — Encounter: Payer: Self-pay | Admitting: Family Medicine

## 2022-03-10 ENCOUNTER — Encounter: Payer: PRIVATE HEALTH INSURANCE | Admitting: Family Medicine

## 2022-03-10 VITALS — BP 114/71 | HR 76 | Ht 61.0 in | Wt 208.4 lb

## 2022-03-10 DIAGNOSIS — Z1211 Encounter for screening for malignant neoplasm of colon: Secondary | ICD-10-CM

## 2022-03-10 DIAGNOSIS — Z23 Encounter for immunization: Secondary | ICD-10-CM | POA: Diagnosis not present

## 2022-03-10 DIAGNOSIS — Z0000 Encounter for general adult medical examination without abnormal findings: Secondary | ICD-10-CM | POA: Diagnosis not present

## 2022-03-10 DIAGNOSIS — R739 Hyperglycemia, unspecified: Secondary | ICD-10-CM | POA: Diagnosis not present

## 2022-03-10 LAB — CBC
HCT: 47.1 % — ABNORMAL HIGH (ref 36.0–46.0)
Hemoglobin: 15.8 g/dL — ABNORMAL HIGH (ref 12.0–15.0)
MCHC: 33.5 g/dL (ref 30.0–36.0)
MCV: 94.5 fl (ref 78.0–100.0)
Platelets: 126 10*3/uL — ABNORMAL LOW (ref 150.0–400.0)
RBC: 4.98 Mil/uL (ref 3.87–5.11)
RDW: 13.7 % (ref 11.5–15.5)
WBC: 6.9 10*3/uL (ref 4.0–10.5)

## 2022-03-10 LAB — COMPREHENSIVE METABOLIC PANEL
ALT: 21 U/L (ref 0–35)
AST: 19 U/L (ref 0–37)
Albumin: 4.3 g/dL (ref 3.5–5.2)
Alkaline Phosphatase: 77 U/L (ref 39–117)
BUN: 12 mg/dL (ref 6–23)
CO2: 28 mEq/L (ref 19–32)
Calcium: 9.4 mg/dL (ref 8.4–10.5)
Chloride: 105 mEq/L (ref 96–112)
Creatinine, Ser: 0.91 mg/dL (ref 0.40–1.20)
GFR: 70.97 mL/min (ref >60.00)
Glucose, Bld: 93 mg/dL (ref 70–99)
Potassium: 4.6 mEq/L (ref 3.5–5.1)
Sodium: 141 mEq/L (ref 135–145)
Total Bilirubin: 0.7 mg/dL (ref 0.2–1.2)
Total Protein: 7.1 g/dL (ref 6.0–8.3)

## 2022-03-10 LAB — LIPID PANEL
Cholesterol: 150 mg/dL (ref 0–200)
HDL: 37.7 mg/dL — ABNORMAL LOW (ref >39.00)
LDL Cholesterol: 74 mg/dL (ref 0–99)
NonHDL: 112.51
Total CHOL/HDL Ratio: 4
Triglycerides: 194 mg/dL — ABNORMAL HIGH (ref 0.0–149.0)
VLDL: 38.8 mg/dL (ref 0.0–40.0)

## 2022-03-10 LAB — HEMOGLOBIN A1C: Hgb A1c MFr Bld: 5.9 % (ref 4.6–6.5)

## 2022-03-10 LAB — TSH: TSH: 4.89 u[IU]/mL (ref 0.35–5.50)

## 2022-03-10 NOTE — Progress Notes (Signed)
? ?Complete physical exam ? ?Patient: Peggy Thomas   DOB: 04/14/1966   56 y.o. Female  MRN: [id number] ? ?Subjective:  ?  ?CC: CPE, no complaints  ? ? ?Peggy Thomas is a 56 y.o. female who presents today for a complete physical exam. She reports consuming a general diet. Home exercise routine includes walking, working in the yard. She generally feels well. She reports sleeping well. She does not have additional problems to discuss today.  ? ? ? ?Most recent fall risk assessment: ? ?  03/10/2022  ? 10:40 AM  ?Fall Risk   ?Falls in the past year? 0  ?Number falls in past yr: 0  ?Injury with Fall? 0  ?Risk for fall due to : No Fall Risks  ?Follow up Falls evaluation completed  ? ?  ?Most recent depression screenings: ? ?  03/10/2022  ? 10:40 AM 08/16/2020  ? 12:35 PM  ?PHQ 2/9 Scores  ?PHQ - 2 Score 0 2  ?PHQ- 9 Score  3  ? ? ?Vision:Within last year, Dental: No current dental problems and Receives regular dental care, and STD: no concerns, declines testing ? ?Patient Active Problem List  ? Diagnosis Date Noted  ? Precancerous skin lesion 02/21/2021  ? Preventative health care 02/15/2020  ? Back pain 02/15/2020  ? Acid reflux 09/15/2018  ? Pharyngitis 09/14/2018  ? Atypical chest pain 09/06/2017  ? Hyperglycemia 07/30/2017  ? Hyperlipidemia 07/30/2017  ? Allergic state 05/01/2017  ? Sinusitis 04/30/2017  ? Right knee pain 07/17/2015  ? Hereditary and idiopathic peripheral neuropathy 07/01/2015  ? Elevated sed rate 07/01/2015  ? Anemia 03/31/2015  ? Right shoulder pain 03/30/2015  ? Nausea without vomiting 03/26/2015  ? Chronic ITP (idiopathic thrombocytopenia) (HCC) 03/03/2015  ? S/P lung surgery, follow-up exam   ? Empyema ([place]) 02/06/2015  ? Pleural empyema ([place]) 02/05/2015  ? Dyspnea   ? Pleural effusion on left   ? Splenic abscess 01/31/2015  ? Vaginal pain 12/03/2014  ? Toe pain, right 04/03/2014  ? Edema 04/03/2014  ? Renal insufficiency 04/03/2014  ? Elevated blood pressure reading 04/03/2014  ?  Hypothyroidism 04/03/2014  ? 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 Thrombocytopenia ([place]) 05/29/2011  ? Depression 04/30/2011  ? Tobacco use disorder 04/30/2011  ? Uterus, adenomyosis 04/30/2011  ? Fatigue   ? Hypocalcemia   ? Obesity   ? ?Past Medical History:  ?Diagnosis Date  ? Allergic state 05/01/2017  ? Anemia 03/31/2015  ? Aneurysm ([place])   ? on spleen-watching   ? Chronic idiopathic thrombocytopenia (HCC)   ? MILD--   HEMATOLOGIST--  DR Marin Olp  ? Depression 04/30/2011  ? History of kidney stones   ? History of tobacco abuse 04/30/2011  ? Hyperglycemia 07/30/2017  ? Hyperlipidemia 07/30/2017  ? Hypocalcemia   ? Hypothyroidism 04/30/2011  ? Nausea without vomiting 03/26/2015  ? Obesity   ? PONV (postoperative nausea and vomiting)   ? Renal insufficiency   ? Right ureteral stone   ? Sinusitis 04/30/2017  ? ?  ? ?Patient Care Team: ?Mosie Lukes, MD as PCP - General (Family Medicine)  ? ?Outpatient Medications Prior to Visit  ?Medication Sig  ? atorvastatin (LIPITOR) 10 MG tablet TAKE 1 TABLET BY MOUTH EVERY DAY  ? cyclobenzaprine (FLEXERIL) 10 MG tablet Take 0.5-1 tablets (5-10 mg total) by mouth at bedtime as needed for muscle spasms.  ? famotidine (PEPCID) 40 MG tablet TAKE 1 TABLET BY MOUTH EVERYDAY AT BEDTIME  ? fluticasone (FLONASE) 50  MCG/ACT nasal spray SPRAY 2 SPRAYS INTO EACH NOSTRIL EVERY DAY  ? ibuprofen (ADVIL) 400 MG tablet TAKE 1 TABLET (400 MG TOTAL) BY MOUTH EVERY 6 (SIX) HOURS AS NEEDED.  ? levothyroxine (SYNTHROID) 88 MCG tablet TAKE 1 TABLET BY MOUTH EVERY DAY BEFORE BREAKFAST  ? [DISCONTINUED] sulfamethoxazole-trimethoprim (BACTRIM DS) 800-160 MG tablet Take 1 tablet by mouth 2 (two) times daily.  ? ?No facility-administered medications prior to visit.  ? ? ?ROS ?All review of systems negative except what is listed in the HPI ? ? ? ? ?   ?Objective:  ? ?  ?BP 114/71   Pulse 76   Ht '5\' 1"'$  (1.549 m)   Wt 208 lb 6.4 oz (94.5 kg)   LMP 08/10/2010   BMI 39.38 kg/m?  ? ? ?Physical  Exam ?Vitals reviewed.  ?Constitutional:   ?   Appearance: Normal appearance. She is obese.  ?HENT:  ?   Head: Normocephalic and atraumatic.  ?   Right Ear: Tympanic membrane normal.  ?   Left Ear: Tympanic membrane normal.  ?   Nose: Nose normal.  ?   Mouth/Throat:  ?   Mouth: Mucous membranes are moist.  ?   Pharynx: Oropharynx is clear.  ?Eyes:  ?   Extraocular Movements: Extraocular movements intact.  ?   Conjunctiva/sclera: Conjunctivae normal.  ?   Pupils: Pupils are equal, round, and reactive to light.  ?Neck:  ?   Vascular: No carotid bruit.  ?Cardiovascular:  ?   Rate and Rhythm: Normal rate and regular rhythm.  ?   Pulses: Normal pulses.  ?   Heart sounds: Normal heart sounds.  ?Pulmonary:  ?   Effort: Pulmonary effort is normal.  ?   Breath sounds: Normal breath sounds.  ?Abdominal:  ?   General: Abdomen is flat. Bowel sounds are normal. There is no distension.  ?   Palpations: Abdomen is soft. There is no mass.  ?   Tenderness: There is no abdominal tenderness. There is no right CVA tenderness, guarding or rebound.  ?Musculoskeletal:     ?   General: Normal range of motion.  ?   Cervical back: Normal range of motion and neck supple. No tenderness.  ?Lymphadenopathy:  ?   Cervical: No cervical adenopathy.  ?Skin: ?   General: Skin is warm and dry.  ?   Capillary Refill: Capillary refill takes less than 2 seconds.  ?Neurological:  ?   General: No focal deficit present.  ?   Mental Status: She is alert and oriented to person, place, and time. Mental status is at baseline.  ?Psychiatric:     ?   Mood and Affect: Mood normal.     ?   Behavior: Behavior normal.     ?   Thought Content: Thought content normal.     ?   Judgment: Judgment normal.  ?  ? ? ? ?No results found for any visits on 03/10/22. ? ?   ?Assessment & Plan:  ?  ?Routine Health Maintenance and Physical Exam ? ?Immunization History  ?Administered Date(s) Administered  ? Influenza,inj,Quad PF,6+ Mos 08/16/2020, 08/26/2021  ? Meningococcal  Conjugate 12/18/2014  ? PFIZER(Purple Top)SARS-COV-2 Vaccination 02/01/2020, 09/21/2020  ? Pension scheme manager 41yr & up 10/14/2021  ? Pneumococcal Polysaccharide-23 12/18/2014  ? Tdap 09/16/2011, 03/10/2022  ? ? ?Health Maintenance  ?Topic Date Due  ? COLONOSCOPY (Pts 45-449yrInsurance coverage will need to be confirmed)  Never done  ? PAP SMEAR-Modifier  08/05/2013  ?  Zoster Vaccines- Shingrix (1 of 2) Never done  ? MAMMOGRAM  02/20/2024  ? TETANUS/TDAP  03/10/2032  ? COVID-19 Vaccine  Completed  ? Hepatitis C Screening  Completed  ? HIV Screening  Completed  ? HPV VACCINES  Aged Out  ? INFLUENZA VACCINE  Discontinued  ? ? ?Discussed health benefits of physical activity, and encouraged her to engage in regular exercise appropriate for her age and condition. ? ?Problem List Items Addressed This Visit   ? ?  ? Other  ? Hyperglycemia  ? Relevant Orders  ? Hemoglobin A1c  ? ?Other Visit Diagnoses   ? ? Annual physical exam    -  Primary  ? Relevant Orders  ? Hemoglobin A1c  ? CBC  ? Comprehensive metabolic panel  ? Lipid panel  ? TSH  ? Cologuard  ? Screen for colon cancer      ? Relevant Orders  ? Cologuard  ? Need for Tdap vaccination      ? Relevant Orders  ? Tdap vaccine greater than or equal to 7yo IM (Completed)  ? ?  ? ?Return in about 6 months (around 09/10/2022) for routine f/u PCP. ? ?  ? ?Terrilyn Saver, NP ? ? ?

## 2022-03-20 ENCOUNTER — Other Ambulatory Visit: Payer: Self-pay | Admitting: Family Medicine

## 2022-04-10 ENCOUNTER — Encounter: Payer: Self-pay | Admitting: *Deleted

## 2022-05-16 ENCOUNTER — Other Ambulatory Visit: Payer: Self-pay | Admitting: Family Medicine

## 2022-05-16 DIAGNOSIS — E7800 Pure hypercholesterolemia, unspecified: Secondary | ICD-10-CM

## 2022-09-10 NOTE — Assessment & Plan Note (Signed)
monitor

## 2022-09-10 NOTE — Assessment & Plan Note (Signed)
Encourage heart healthy diet such as MIND or DASH diet, increase exercise, avoid trans fats, simple carbohydrates and processed foods, consider a krill or fish or flaxseed oil cap daily. Tolerating Atorvastatin 

## 2022-09-10 NOTE — Assessment & Plan Note (Signed)
Encouraged complete cessation. Discussed need to quit as relates to risk of numerous cancers, cardiac and pulmonary disease as well as neurologic complications. Counseled for greater than 3 minutes, consumption is up some since losing her husband.

## 2022-09-10 NOTE — Assessment & Plan Note (Signed)
Hydrate and monitor 

## 2022-09-10 NOTE — Assessment & Plan Note (Addendum)
hgba1c acceptable, minimize simple carbs. Increase exercise as tolerated.  Consider flu and covid boosters Shingrix is the new shingles shot, 2 shots over 2-6 months, confirm coverage with insurance and document, then can return here for shots with nurse appt or at pharmacy

## 2022-09-11 ENCOUNTER — Ambulatory Visit: Payer: Managed Care, Other (non HMO) | Admitting: Family Medicine

## 2022-09-11 VITALS — BP 125/74 | HR 68 | Temp 98.0°F | Resp 16 | Ht 61.0 in | Wt 211.6 lb

## 2022-09-11 DIAGNOSIS — F32A Depression, unspecified: Secondary | ICD-10-CM

## 2022-09-11 DIAGNOSIS — N289 Disorder of kidney and ureter, unspecified: Secondary | ICD-10-CM

## 2022-09-11 DIAGNOSIS — M25512 Pain in left shoulder: Secondary | ICD-10-CM | POA: Diagnosis not present

## 2022-09-11 DIAGNOSIS — E785 Hyperlipidemia, unspecified: Secondary | ICD-10-CM | POA: Diagnosis not present

## 2022-09-11 DIAGNOSIS — R700 Elevated erythrocyte sedimentation rate: Secondary | ICD-10-CM | POA: Diagnosis not present

## 2022-09-11 DIAGNOSIS — R739 Hyperglycemia, unspecified: Secondary | ICD-10-CM

## 2022-09-11 DIAGNOSIS — F17200 Nicotine dependence, unspecified, uncomplicated: Secondary | ICD-10-CM

## 2022-09-11 LAB — CBC
HCT: 47.5 % — ABNORMAL HIGH (ref 36.0–46.0)
Hemoglobin: 16.1 g/dL — ABNORMAL HIGH (ref 12.0–15.0)
MCHC: 34 g/dL (ref 30.0–36.0)
MCV: 94.7 fl (ref 78.0–100.0)
Platelets: 131 10*3/uL — ABNORMAL LOW (ref 150.0–400.0)
RBC: 5.02 Mil/uL (ref 3.87–5.11)
RDW: 13.3 % (ref 11.5–15.5)
WBC: 7.9 10*3/uL (ref 4.0–10.5)

## 2022-09-11 LAB — COMPREHENSIVE METABOLIC PANEL
ALT: 19 U/L (ref 0–35)
AST: 17 U/L (ref 0–37)
Albumin: 4.3 g/dL (ref 3.5–5.2)
Alkaline Phosphatase: 75 U/L (ref 39–117)
BUN: 10 mg/dL (ref 6–23)
CO2: 29 mEq/L (ref 19–32)
Calcium: 9.6 mg/dL (ref 8.4–10.5)
Chloride: 104 mEq/L (ref 96–112)
Creatinine, Ser: 0.89 mg/dL (ref 0.40–1.20)
GFR: 72.62 mL/min (ref >60.00)
Glucose, Bld: 93 mg/dL (ref 70–99)
Potassium: 4.6 mEq/L (ref 3.5–5.1)
Sodium: 139 mEq/L (ref 135–145)
Total Bilirubin: 0.6 mg/dL (ref 0.2–1.2)
Total Protein: 7.1 g/dL (ref 6.0–8.3)

## 2022-09-11 LAB — LIPID PANEL
Cholesterol: 159 mg/dL (ref 0–200)
HDL: 41.3 mg/dL (ref >39.00)
NonHDL: 117.69
Total CHOL/HDL Ratio: 4
Triglycerides: 216 mg/dL — ABNORMAL HIGH (ref 0.0–149.0)
VLDL: 43.2 mg/dL — ABNORMAL HIGH (ref 0.0–40.0)

## 2022-09-11 LAB — TSH: TSH: 4.09 u[IU]/mL (ref 0.35–5.50)

## 2022-09-11 LAB — LDL CHOLESTEROL, DIRECT: Direct LDL: 92 mg/dL

## 2022-09-11 LAB — SEDIMENTATION RATE: Sed Rate: 27 mm/hr (ref 0–30)

## 2022-09-11 LAB — HEMOGLOBIN A1C: Hgb A1c MFr Bld: 6.2 % (ref 4.6–6.5)

## 2022-09-11 NOTE — Progress Notes (Signed)
Subjective:   By signing my name below, I, Kellie Simmering, attest that this documentation has been prepared under the direction and in the presence of Mosie Lukes, MD., 09/11/2022.   Patient ID: Peggy Thomas, female    DOB: 04-22-1966, 56 y.o.   MRN: [id number]  Chief Complaint  Patient presents with   Follow-up    Here for follow up   HPI Patient is in today for an office visit.  Diet/Exercise: She states that she does not maintain a balanced diet nor exercise regularly.  Depression/Grief: She is visibly upset during today's visit and reports that her husband of 35 years passed away in 04/10/2023. She chooses not to take medications to manage her depression.   Immunizations: She has been informed about receiving COVID-19, Flu, and Shingles immunizations.  Left Shoulder Pain: She reports that her left shoulder has been hurting constantly since Apr 10, 2023 after receiving a Tetanus immunization. She describes the location of the pain as t-shaped on the left shoulder. She states that movement exacerbates the pain and she has been taking Motrin to manage this. She is interested in receiving a sports medicine referral.  Social History: She currently smokes 0.5-0.75 packets of cigarettes daily but says that she will attempt to quit.  Past Medical History:  Diagnosis Date   Allergic state 05/01/2017   Anemia 03/31/2015   Aneurysm ([place])    on spleen-watching    Chronic idiopathic thrombocytopenia (HCC)    MILD--   HEMATOLOGIST--  DR Marin Olp   Depression 04/30/2011   History of kidney stones    History of tobacco abuse 04/30/2011   Hyperglycemia 07/30/2017   Hyperlipidemia 07/30/2017   Hypocalcemia    Hypothyroidism 04/30/2011   Nausea without vomiting 03/26/2015   Obesity    PONV (postoperative nausea and vomiting)    Renal insufficiency    Right ureteral stone    Sinusitis 04/30/2017   Past Surgical History:  Procedure Laterality Date   ABDOMINAL HYSTERECTOMY     [place], URETEROSCOPY AND STENT PLACEMENT Right 07/25/2013   Procedure: CYSTOSCOPY WITH RETROGRADE PYELOGRAM, URETEROSCOPY, STONE EXTRACTION AND POSSIBLE STENT PLACEMENT;  Surgeon: Ailene Rud, MD;  Location: [facility];  Service: Urology;  Laterality: Right;   EMBOLIZATION N/A 12/26/2014   Procedure: EMBOLIZATION;  Surgeon: Serafina Mitchell, MD;  Location: [facility] CATH LAB;  Service: Cardiovascular;  Laterality: N/A;   EXTRACORPOREAL SHOCK WAVE LITHOTRIPSY  2003   KNEE ARTHROSCOPY     PERIPHERAL VASCULAR CATHETERIZATION  12/26/2014   Procedure: VISCERAL ANGIOGRAPHY;  Surgeon: Serafina Mitchell, MD;  Location: [facility] CATH LAB;  Service: Cardiovascular;;   ROBOTIC ASSISTED TOTAL HYSTERECTOMY  10-15-2010   LAPAROSCOPIC /  LYSIS ADHESIONS   TUBAL LIGATION  1999   VIDEO ASSISTED THORACOSCOPY (VATS)/EMPYEMA Left 02/06/2015   Procedure: LEFT VIDEO ASSISTED THORACOSCOPY (VATS)/DRAINAGE OF EMPYEMA;  Surgeon: Grace Isaac, MD;  Location: [place] [facility];  Service: Thoracic;  Laterality: Left;  (L)VATS, DRAINAGE OF EMPYEMA   VIDEO BRONCHOSCOPY N/A 02/06/2015   Procedure: VIDEO BRONCHOSCOPY;  Surgeon: Grace Isaac, MD;  Location: [facility] OR;  Service: Thoracic;  Laterality: N/A;   Family History  Problem Relation Age of Onset   Diabetes Mother        type 2   Asthma Mother    Cancer Mother        skin cancer   Renal Disease Mother  Decreased libido Mother    Dementia Mother    Cancer Father        bone   Mental illness Sister        schizophrenia   Alcohol abuse Maternal Grandmother    Stroke Maternal Grandmother    Aneurysm Maternal Grandmother        brain aneurysm   Other Maternal Grandfather        black lung   Alzheimer's disease Paternal Grandmother    Stroke Maternal Aunt    Social History   Socioeconomic History   Marital status: Married    Spouse name: Not on file   Number of children: Not on file    Years of education: Not on file   Highest education level: Not on file  Occupational History   Not on file  Tobacco Use   Smoking status: Every Day    Packs/day: 0.75    Years: 30.00    Total pack years: 22.50    Types: Cigarettes   Smokeless tobacco: Never  Substance and Sexual Activity   Alcohol use: Yes    Alcohol/week: 0.0 standard drinks of alcohol    Comment: occasionally/socially   Drug use: No   Sexual activity: Yes    Partners: Male    Birth control/protection: Surgical    Comment: TLH/LOA  Other Topics Concern   Not on file  Social History Narrative   Not on file   Social Determinants of Health   Financial Resource Strain: Not on file  Food Insecurity: Not on file  Transportation Needs: Not on file  Physical Activity: Not on file  Stress: Not on file  Social Connections: Not on file  Intimate Partner Violence: Not on file   Outpatient Medications Prior to Visit  Medication Sig Dispense Refill   atorvastatin (LIPITOR) 10 MG tablet TAKE 1 TABLET BY MOUTH EVERY DAY 90 tablet 1   cyclobenzaprine (FLEXERIL) 10 MG tablet Take 0.5-1 tablets (5-10 mg total) by mouth at bedtime as needed for muscle spasms. 30 tablet 1   famotidine (PEPCID) 40 MG tablet TAKE 1 TABLET BY MOUTH EVERYDAY AT BEDTIME 30 tablet 5   fluticasone (FLONASE) 50 MCG/ACT nasal spray SPRAY 2 SPRAYS INTO EACH NOSTRIL EVERY DAY 48 mL 1   ibuprofen (ADVIL) 400 MG tablet TAKE 1 TABLET (400 MG TOTAL) BY MOUTH EVERY 6 (SIX) HOURS AS NEEDED. 90 tablet 1   levothyroxine (SYNTHROID) 88 MCG tablet TAKE 1 TABLET BY MOUTH EVERY DAY BEFORE BREAKFAST 90 tablet 1   No facility-administered medications prior to visit.   Allergies  Allergen Reactions   Wellbutrin [Bupropion] Palpitations    palpitations   Review of Systems  Musculoskeletal:        (+) left shoulder.  Psychiatric/Behavioral:  Positive for depression.        (+) grief.      Objective:    Physical Exam Constitutional:      General: She  is not in acute distress.    Appearance: Normal appearance. She is not ill-appearing.  HENT:     Head: Normocephalic and atraumatic.     Right Ear: External ear normal.     Left Ear: External ear normal.     Mouth/Throat:     Mouth: Mucous membranes are moist.     Pharynx: Oropharynx is clear.  Eyes:     Extraocular Movements: Extraocular movements intact.     Pupils: Pupils are equal, round, and reactive to light.  Cardiovascular:  Rate and Rhythm: Normal rate and regular rhythm.     Pulses: Normal pulses.     Heart sounds: Normal heart sounds. No murmur heard.    No gallop.  Pulmonary:     Effort: Pulmonary effort is normal. No respiratory distress.     Breath sounds: Normal breath sounds. No wheezing or rales.  Abdominal:     General: Bowel sounds are normal.  Skin:    General: Skin is warm and dry.  Neurological:     Mental Status: She is alert and oriented to person, place, and time.     Sensory: Sensory deficit present.  Psychiatric:        Mood and Affect: Mood normal.        Behavior: Behavior normal.        Judgment: Judgment normal.    BP 125/74 (BP Location: Right Arm, Patient Position: Sitting, Cuff Size: Normal)   Pulse 68   Temp 98 F (36.7 C) (Oral)   Resp 16   Ht '5\' 1"'$  (1.549 m)   Wt 211 lb 9.6 oz (96 kg)   LMP 08/10/2010   SpO2 99%   BMI 39.98 kg/m  Wt Readings from Last 3 Encounters:  09/11/22 211 lb 9.6 oz (96 kg)  03/10/22 208 lb 6.4 oz (94.5 kg)  08/26/21 208 lb 9.6 oz (94.6 kg)   Diabetic Foot Exam - Simple   No data filed    Lab Results  Component Value Date   WBC 7.9 09/11/2022   HGB 16.1 (H) 09/11/2022   HCT 47.5 (H) 09/11/2022   PLT 131.0 (L) 09/11/2022   GLUCOSE 93 09/11/2022   CHOL 159 09/11/2022   TRIG 216.0 (H) 09/11/2022   HDL 41.30 09/11/2022   LDLDIRECT 92.0 09/11/2022   LDLCALC 74 03/10/2022   ALT 19 09/11/2022   AST 17 09/11/2022   NA 139 09/11/2022   K 4.6 09/11/2022   CL 104 09/11/2022   CREATININE 0.89  09/11/2022   BUN 10 09/11/2022   CO2 29 09/11/2022   TSH 4.09 09/11/2022   INR 1.01 05/04/2017   HGBA1C 6.2 09/11/2022   Lab Results  Component Value Date   TSH 4.09 09/11/2022   Lab Results  Component Value Date   WBC 7.9 09/11/2022   HGB 16.1 (H) 09/11/2022   HCT 47.5 (H) 09/11/2022   MCV 94.7 09/11/2022   PLT 131.0 (L) 09/11/2022   Lab Results  Component Value Date   NA 139 09/11/2022   K 4.6 09/11/2022   CO2 29 09/11/2022   GLUCOSE 93 09/11/2022   BUN 10 09/11/2022   CREATININE 0.89 09/11/2022   BILITOT 0.6 09/11/2022   ALKPHOS 75 09/11/2022   AST 17 09/11/2022   ALT 19 09/11/2022   PROT 7.1 09/11/2022   ALBUMIN 4.3 09/11/2022   CALCIUM 9.6 09/11/2022   ANIONGAP 9 08/28/2017   GFR 72.62 09/11/2022   Lab Results  Component Value Date   CHOL 159 09/11/2022   Lab Results  Component Value Date   HDL 41.30 09/11/2022   Lab Results  Component Value Date   LDLCALC 74 03/10/2022   Lab Results  Component Value Date   TRIG 216.0 (H) 09/11/2022   Lab Results  Component Value Date   CHOLHDL 4 09/11/2022   Lab Results  Component Value Date   HGBA1C 6.2 09/11/2022      Assessment & Plan:   Problem List Items Addressed This Visit     Depression    Lost her  husband unexpectedly in May 2023 and is struggling with trying to sell her house and move closer to her daughter, trying to get all of the accounts settled etc. She is sad but getting thru it with support of friends and family. She will let us know if she needs any further support from [place] during this difficult time.       Tobacco use disorder    Encouraged complete cessation. Discussed need to quit as relates to risk of numerous cancers, cardiac and pulmonary disease as well as neurologic complications. Counseled for greater than 3 minutes, consumption is up some since losing her husband.       Renal insufficiency    Hydrate and monitor      Relevant Orders   Comprehensive metabolic panel  (Completed)   Hemoglobin A1c (Completed)   Elevated sed rate    monitor      Relevant Orders   Sedimentation rate (Completed)   Hyperglycemia    hgba1c acceptable, minimize simple carbs. Increase exercise as tolerated.  Consider flu and covid boosters Shingrix is the new shingles shot, 2 shots over 2-6 months, confirm coverage with insurance and document, then can return here for shots with nurse appt or at pharmacy      Relevant Orders   Lipid panel (Completed)   TSH (Completed)   Hemoglobin A1c (Completed)   Hyperlipidemia    Encourage heart healthy diet such as MIND or DASH diet, increase exercise, avoid trans fats, simple carbohydrates and processed foods, consider a krill or fish or flaxseed oil cap daily. Tolerating Atorvastatin      Relevant Orders   Lipid panel (Completed)   Other Visit Diagnoses     Left shoulder pain, unspecified chronicity    -  Primary   Relevant Orders   Ambulatory referral to Sports Medicine   CBC (Completed)      No orders of the defined types were placed in this encounter.  I, Penni Homans, MD, personally preformed the services described in this documentation.  All medical record entries made by the scribe were at my direction and in my presence.  I have reviewed the chart and discharge instructions (if applicable) and agree that the record reflects my personal performance and is accurate and complete. 09/11/2022  I,Mohammed Iqbal,acting as a scribe for Penni Homans, MD.,have documented all relevant documentation on the behalf of Penni Homans, MD,as directed by  Penni Homans, MD while in the presence of Penni Homans, MD.  Penni Homans, MD

## 2022-09-11 NOTE — Assessment & Plan Note (Signed)
Lost her husband unexpectedly in May 2023 and is struggling with trying to sell her house and move closer to her daughter, trying to get all of the accounts settled etc. She is sad but getting thru it with support of friends and family. She will let us know if she needs any further support from [place] during this difficult time.

## 2022-09-11 NOTE — Patient Instructions (Addendum)
Consider flu and covid boosters Shingrix is the new shingles shot, 2 shots over 2-6 months, confirm coverage with insurance and document, then can return here for shots with nurse appt or at pharmacy  Shoulder Pain Many things can cause shoulder pain, including: An injury to the shoulder. Overuse of the shoulder. Arthritis. The source of the pain can be: Inflammation. An injury to the shoulder joint. An injury to a tendon, ligament, or bone. Follow these instructions at home: Pay attention to changes in your symptoms. Let your health care provider know about them. Follow these instructions to relieve your pain. If you have a sling: Wear the sling as told by your health care provider. Remove it only as told by your health care provider. Loosen the sling if your fingers tingle, become numb, or turn cold and blue. Keep the sling clean. If the sling is not waterproof: Do not let it get wet. Remove it to shower or bathe. Move your arm as little as possible, but keep your hand moving to prevent swelling. Managing pain, stiffness, and swelling  If directed, put ice on the painful area: Put ice in a plastic bag. Place a towel between your skin and the bag. Leave the ice on for 20 minutes, 2-3 times per day. Stop applying ice if it does not help with the pain. Squeeze a soft ball or a foam pad as much as possible. This helps to keep the shoulder from swelling. It also helps to strengthen the arm. General instructions Take over-the-counter and prescription medicines only as told by your health care provider. Keep all follow-up visits as told by your health care provider. This is important. Contact a health care provider if: Your pain gets worse. Your pain is not relieved with medicines. New pain develops in your arm, hand, or fingers. Get help right away if: Your arm, hand, or fingers: Tingle. Become numb. Become swollen. Become painful. Turn white or blue. Summary Shoulder pain can  be caused by an injury, overuse, or arthritis. Pay attention to changes in your symptoms. Let your health care provider know about them. This condition may be treated with a sling, ice, and pain medicines. Contact your health care provider if the pain gets worse or new pain develops. Get help right away if your arm, hand, or fingers tingle or become numb, swollen, or painful. Keep all follow-up visits as told by your health care provider. This is important. This information is not intended to replace advice given to you by your health care provider. Make sure you discuss any questions you have with your health care provider. Document Revised: 07/12/2021 Document Reviewed: 07/12/2021 Elsevier Patient Education  [place].

## 2022-09-12 ENCOUNTER — Ambulatory Visit: Payer: Managed Care, Other (non HMO) | Admitting: Family Medicine

## 2022-09-12 ENCOUNTER — Ambulatory Visit: Payer: Self-pay

## 2022-09-12 VITALS — BP 146/86 | HR 89 | Ht 61.0 in | Wt 211.0 lb

## 2022-09-12 DIAGNOSIS — M25512 Pain in left shoulder: Secondary | ICD-10-CM | POA: Diagnosis not present

## 2022-09-12 DIAGNOSIS — G8929 Other chronic pain: Secondary | ICD-10-CM

## 2022-09-12 NOTE — Progress Notes (Signed)
I, Peterson Lombard, LAT, ATC acting as a scribe for Lynne Leader, MD.  Subjective:    CC: Left shoulder pain  HPI: Patient is a 56 year old female presenting with left shoulder pain ongoing since May after receiving a tetanus booster.  Patient locates pain to across the [facility] joint and distally into L upper arm, making a "T" shape over the area. Pt is R-hand dominate.  She notes shortly after she got her tetanus shot she packed her house up and moved which may have worsened her shoulder pain as well.   Neck pain: no UE numbness/tingling: no UE weakness: yes- after exertion Aggravates: shoulder flex, turning steering wheel Treatments tried: ice, heat, IBU, Tylenol, EO, naproxen, Voltaren gel  Dx imaging: 01/04/2020 C-spine x-ray  Pertinent review of Systems: No fevers or chills  Relevant historical information: History of ITP   Objective:    Vitals:   09/12/22 1043  BP: (!) 146/86  Pulse: 89  SpO2: 96%   General: Well Developed, well nourished, and in no acute distress.   MSK: Left shoulder normal-appearing Normal motion pain with abduction. Positive Hawkins and Neer's test.  Positive empty can test. Negative Yergason's and speeds test. Strength 4/5 abduction and external rotation.  5/5 internal rotation. Pulses capillary refill and sensation are intact distally.  Lab and Radiology Results  Procedure: Real-time Ultrasound Guided Injection of left shoulder subacromial bursa Device: Philips Affiniti 50G Images permanently stored and available for review in PACS Ultrasound evaluation prior to injection reveals moderate subacromial bursitis.  Intact rotator cuff tendons are present. Verbal informed consent obtained.  Discussed risks and benefits of procedure. Warned about infection, bleeding, hyperglycemia damage to structures among others. Patient expresses understanding and agreement Time-out conducted.   Noted no overlying erythema, induration, or other signs of local  infection.   Skin prepped in a sterile fashion.   Local anesthesia: Topical Ethyl chloride.   With sterile technique and under real time ultrasound guidance: 40 mg of Kenalog and 2 mL of Marcaine injected into subacromial bursa. Fluid seen entering the bursa.   Completed without difficulty   Pain immediately resolved suggesting accurate placement of the medication.   Advised to call if fevers/chills, erythema, induration, drainage, or persistent bleeding.   Images permanently stored and available for review in the ultrasound unit.  Impression: Technically successful ultrasound guided injection.        Impression and Recommendations:    Assessment and Plan: 56 y.o. female with left shoulder pain thought to be due to subacromial bursitis and impingement.  The increased activity with packing a house certainly could be a factor here.  It is possible that the increased inflammation from tetanus vaccine may be a factor as well.  Discussed options.  Plan for subacromial injection and home exercise program.  Could add formal physical therapy to [facility] if needed.  She can call me and let me know.  Otherwise check back in 6 weeks especially if not better.  Held off on x-ray today.  Can get that at the next visit if needed.Marland Kitchen  PDMP not reviewed this encounter. Orders Placed This Encounter  Procedures   [place] LIMITED JOINT SPACE STRUCTURES UP LEFT(NO LINKED CHARGES)    Order Specific Question:   Reason for Exam (SYMPTOM  OR DIAGNOSIS REQUIRED)    Answer:   Left shoulder pain    Order Specific Question:   Preferred imaging location?    Answer:   [facility]   No orders of  the defined types were placed in this encounter.   Discussed warning signs or symptoms. Please see discharge instructions. Patient expresses understanding.   The above documentation has been reviewed and is accurate and complete Lynne Leader, M.D.

## 2022-09-12 NOTE — Patient Instructions (Addendum)
Thank you for coming in today.   Please complete the exercises that the athletic trainer went over with you:  View at [email or web address]-[email or web address] using code: [id number]  You received an injection today. Seek immediate medical attention if the joint becomes red, extremely painful, or is oozing fluid.   Let me know if you are not improving and we can refer you to physical therapy  Check back in 6 weeks.

## 2022-09-20 ENCOUNTER — Other Ambulatory Visit: Payer: Self-pay | Admitting: Family Medicine

## 2022-10-24 ENCOUNTER — Ambulatory Visit: Payer: Managed Care, Other (non HMO) | Admitting: Family Medicine

## 2022-11-16 ENCOUNTER — Other Ambulatory Visit: Payer: Self-pay | Admitting: Family Medicine

## 2022-11-16 DIAGNOSIS — E7800 Pure hypercholesterolemia, unspecified: Secondary | ICD-10-CM

## 2023-01-06 ENCOUNTER — Other Ambulatory Visit: Payer: Self-pay | Admitting: Family Medicine

## 2023-01-06 DIAGNOSIS — Z1231 Encounter for screening mammogram for malignant neoplasm of breast: Secondary | ICD-10-CM

## 2023-02-23 ENCOUNTER — Ambulatory Visit
Admission: RE | Admit: 2023-02-23 | Discharge: 2023-02-23 | Disposition: A | Discharge status: Home / Self Care | Payer: Managed Care, Other (non HMO) | Source: Ambulatory Visit | Attending: Family Medicine | Admitting: Family Medicine

## 2023-02-23 DIAGNOSIS — Z1231 Encounter for screening mammogram for malignant neoplasm of breast: Secondary | ICD-10-CM

## 2023-03-14 NOTE — Assessment & Plan Note (Signed)
Encourage heart healthy diet such as MIND or DASH diet, increase exercise, avoid trans fats, simple carbohydrates and processed foods, consider a krill or fish or flaxseed oil cap daily. Tolerating Atorvastatin 

## 2023-03-14 NOTE — Assessment & Plan Note (Signed)
On Levothyroxine, continue to monitor 

## 2023-03-14 NOTE — Assessment & Plan Note (Signed)
Avoid offending foods, start probiotics. Do not eat large meals in late evening and consider raising head of bed.  

## 2023-03-14 NOTE — Assessment & Plan Note (Signed)
Continues to monitor, asymptomatic

## 2023-03-14 NOTE — Assessment & Plan Note (Signed)
Encouraged DASH or MIND diet, decrease po intake and increase exercise as tolerated. Needs 7-8 hours of sleep nightly. Avoid trans fats, eat small, frequent meals every 4-5 hours with lean proteins, complex carbs and healthy fats. Minimize simple carbs, high fat foods and processed foods 

## 2023-03-14 NOTE — Assessment & Plan Note (Signed)
hgba1c acceptable, minimize simple carbs. Increase exercise as tolerated.  

## 2023-03-14 NOTE — Assessment & Plan Note (Signed)
Supplement and monitor 

## 2023-03-14 NOTE — Assessment & Plan Note (Signed)
Hydrate and monitor 

## 2023-03-16 ENCOUNTER — Encounter: Payer: Self-pay | Admitting: Family Medicine

## 2023-03-16 ENCOUNTER — Ambulatory Visit: Payer: Managed Care, Other (non HMO) | Admitting: Family Medicine

## 2023-03-16 VITALS — BP 126/78 | HR 59 | Temp 98.0°F | Resp 16 | Ht 61.0 in | Wt 214.0 lb

## 2023-03-16 DIAGNOSIS — E039 Hypothyroidism, unspecified: Secondary | ICD-10-CM | POA: Diagnosis not present

## 2023-03-16 DIAGNOSIS — N289 Disorder of kidney and ureter, unspecified: Secondary | ICD-10-CM

## 2023-03-16 DIAGNOSIS — R739 Hyperglycemia, unspecified: Secondary | ICD-10-CM | POA: Diagnosis not present

## 2023-03-16 DIAGNOSIS — F4321 Adjustment disorder with depressed mood: Secondary | ICD-10-CM | POA: Insufficient documentation

## 2023-03-16 DIAGNOSIS — D693 Immune thrombocytopenic purpura: Secondary | ICD-10-CM

## 2023-03-16 DIAGNOSIS — E669 Obesity, unspecified: Secondary | ICD-10-CM

## 2023-03-16 DIAGNOSIS — K219 Gastro-esophageal reflux disease without esophagitis: Secondary | ICD-10-CM

## 2023-03-16 DIAGNOSIS — F17200 Nicotine dependence, unspecified, uncomplicated: Secondary | ICD-10-CM

## 2023-03-16 DIAGNOSIS — E785 Hyperlipidemia, unspecified: Secondary | ICD-10-CM | POA: Diagnosis not present

## 2023-03-16 LAB — COMPREHENSIVE METABOLIC PANEL
ALT: 17 U/L (ref 0–35)
AST: 17 U/L (ref 0–37)
Albumin: 4.2 g/dL (ref 3.5–5.2)
Alkaline Phosphatase: 75 U/L (ref 39–117)
BUN: 13 mg/dL (ref 6–23)
CO2: 28 mEq/L (ref 19–32)
Calcium: 9.5 mg/dL (ref 8.4–10.5)
Chloride: 103 mEq/L (ref 96–112)
Creatinine, Ser: 0.94 mg/dL (ref 0.40–1.20)
GFR: 67.77 mL/min (ref >60.00)
Glucose, Bld: 94 mg/dL (ref 70–99)
Potassium: 4.8 mEq/L (ref 3.5–5.1)
Sodium: 140 mEq/L (ref 135–145)
Total Bilirubin: 0.6 mg/dL (ref 0.2–1.2)
Total Protein: 6.9 g/dL (ref 6.0–8.3)

## 2023-03-16 LAB — CBC WITH DIFFERENTIAL/PLATELET
Basophils Absolute: 0 10*3/uL (ref 0.0–0.1)
Basophils Relative: 0.7 % (ref 0.0–3.0)
Eosinophils Absolute: 0.1 10*3/uL (ref 0.0–0.7)
Eosinophils Relative: 2.3 % (ref 0.0–5.0)
HCT: 45.1 % (ref 36.0–46.0)
Hemoglobin: 15.5 g/dL — ABNORMAL HIGH (ref 12.0–15.0)
Lymphocytes Relative: 39.5 % (ref 12.0–46.0)
Lymphs Abs: 2.6 10*3/uL (ref 0.7–4.0)
MCHC: 34.4 g/dL (ref 30.0–36.0)
MCV: 93.3 fl (ref 78.0–100.0)
Monocytes Absolute: 0.4 10*3/uL (ref 0.1–1.0)
Monocytes Relative: 5.9 % (ref 3.0–12.0)
Neutro Abs: 3.4 10*3/uL (ref 1.4–7.7)
Neutrophils Relative %: 51.6 % (ref 43.0–77.0)
Platelets: 134 10*3/uL — ABNORMAL LOW (ref 150.0–400.0)
RBC: 4.83 Mil/uL (ref 3.87–5.11)
RDW: 13.3 % (ref 11.5–15.5)
WBC: 6.5 10*3/uL (ref 4.0–10.5)

## 2023-03-16 LAB — LDL CHOLESTEROL, DIRECT: Direct LDL: 93 mg/dL

## 2023-03-16 LAB — HEMOGLOBIN A1C: Hgb A1c MFr Bld: 6.2 % (ref 4.6–6.5)

## 2023-03-16 LAB — TSH: TSH: 5.95 u[IU]/mL — ABNORMAL HIGH (ref 0.35–5.50)

## 2023-03-16 LAB — LIPID PANEL
Cholesterol: 155 mg/dL (ref 0–200)
HDL: 39.8 mg/dL (ref >39.00)
NonHDL: 115.2
Total CHOL/HDL Ratio: 4
Triglycerides: 215 mg/dL — ABNORMAL HIGH (ref 0.0–149.0)
VLDL: 43 mg/dL — ABNORMAL HIGH (ref 0.0–40.0)

## 2023-03-16 NOTE — Patient Instructions (Signed)

## 2023-03-16 NOTE — Assessment & Plan Note (Signed)
Lost her husband a year ago this month. She is going to spend time with her kids to get thru it offered counseling but declines for now

## 2023-03-16 NOTE — Progress Notes (Signed)
Subjective:   By signing my name below, I, Barrett Shell, attest that this documentation has been prepared under the direction and in the presence of Bradd Canary, MD. 03/16/2023   Patient ID: Peggy Thomas, female    DOB: [birth year], 57 y.o.   MRN: [id number]  Chief Complaint  Patient presents with   Follow-up    Follow up    HPI Patient is in today for a follow-up appointment.  Left shoulder pain Her left shoulder has improved since her last visit. She received a cortisone injection which provided significant relief. She is fine as long as she does not do heavy lifting  A1C Her A1C was slightly elevated during her last blood work. Her eating habits have not changed much, but she reports that she is not eating the healthiest meals. She denies having any frequency.  Lab Results  Component Value Date   HGBA1C 6.2 09/11/2022    Mood  Her husband passed away a year ago. She is not interested in counseling at this time.   Past Medical History:  Diagnosis Date   Allergic state 05/01/2017   Anemia 03/31/2015   Aneurysm (HCC)    on spleen-watching    Chronic idiopathic thrombocytopenia (HCC)    MILD--   HEMATOLOGIST--  DR Myna Hidalgo   Depression 04/30/2011   History of kidney stones    History of tobacco abuse 04/30/2011   Hyperglycemia 07/30/2017   Hyperlipidemia 07/30/2017   Hypocalcemia    Hypothyroidism 04/30/2011   Nausea without vomiting 03/26/2015   Obesity    PONV (postoperative nausea and vomiting)    Renal insufficiency    Right ureteral stone    Sinusitis 04/30/2017    Past Surgical History:  Procedure Laterality Date   ABDOMINAL HYSTERECTOMY     CESAREAN SECTION  1996 and 1998   CHOLECYSTECTOMY  2000   CYSTOSCOPY WITH RETROGRADE PYELOGRAM, URETEROSCOPY AND STENT PLACEMENT Right 07/25/2013   Procedure: CYSTOSCOPY WITH RETROGRADE PYELOGRAM, URETEROSCOPY, STONE EXTRACTION AND POSSIBLE STENT PLACEMENT;  Surgeon: Kathi Ludwig, MD;  Location[facility];  Service: Urology;  Laterality: Right;   EMBOLIZATION N/A 12/26/2014   Procedure: EMBOLIZATION;  Surgeon: Nada Libman, MD;  Location: [facility] CATH LAB;  Service: Cardiovascular;  Laterality: N/A;   EXTRACORPOREAL SHOCK WAVE LITHOTRIPSY  2003   KNEE ARTHROSCOPY     PERIPHERAL VASCULAR CATHETERIZATION  12/26/2014   Procedure: VISCERAL ANGIOGRAPHY;  Surgeon: Nada Libman, MD;  Location: [facility] CATH LAB;  Service: Cardiovascular;;   ROBOTIC ASSISTED TOTAL HYSTERECTOMY  10-15-2010   LAPAROSCOPIC /  LYSIS ADHESIONS   TUBAL LIGATION  1999   VIDEO ASSISTED THORACOSCOPY (VATS)/EMPYEMA Left 02/06/2015   Procedure: LEFT VIDEO ASSISTED THORACOSCOPY (VATS)/DRAINAGE OF EMPYEMA;  Surgeon: Delight Ovens, MD;  Location: [facility] OR;  Service: Thoracic;  Laterality: Left;  (L)VATS, DRAINAGE OF EMPYEMA   VIDEO BRONCHOSCOPY N/A 02/06/2015   Procedure: VIDEO BRONCHOSCOPY;  Surgeon: Delight Ovens, MD;  Location: [facility] OR;  Service: Thoracic;  Laterality: N/A;    Family History  Problem Relation Age of Onset   Diabetes Mother        type 2   Asthma Mother    Cancer Mother        skin cancer   Renal Disease Mother    Decreased libido Mother    Dementia Mother    Cancer Father        bone   Mental illness Sister  schizophrenia   Alcohol abuse Maternal Grandmother    Stroke Maternal Grandmother    Aneurysm Maternal Grandmother        brain aneurysm   Other Maternal Grandfather        black lung   Alzheimer's disease Paternal Grandmother    Stroke Maternal Aunt     Social History   Socioeconomic History   Marital status: Married    Spouse name: Not on file   Number of children: Not on file   Years of education: Not on file   Highest education level: Not on file  Occupational History   Not on file  Tobacco Use   Smoking status: Every Day    Packs/day: 0.75    Years: 30.00    Additional pack years: 0.00    Total pack years: 22.50    Types: Cigarettes   Smokeless tobacco:  Never  Substance and Sexual Activity   Alcohol use: Yes    Alcohol/week: 0.0 standard drinks of alcohol    Comment: occasionally/socially   Drug use: No   Sexual activity: Yes    Partners: Male    Birth control/protection: Surgical    Comment: TLH/LOA  Other Topics Concern   Not on file  Social History Narrative   Not on file   Social Determinants of Health   Financial Resource Strain: Not on file  Food Insecurity: Not on file  Transportation Needs: Not on file  Physical Activity: Not on file  Stress: Not on file  Social Connections: Not on file  Intimate Partner Violence: Not on file    Outpatient Medications Prior to Visit  Medication Sig Dispense Refill   atorvastatin (LIPITOR) 10 MG tablet TAKE 1 TABLET BY MOUTH EVERY DAY 90 tablet 1   famotidine (PEPCID) 40 MG tablet TAKE 1 TABLET BY MOUTH EVERYDAY AT BEDTIME 30 tablet 5   fluticasone (FLONASE) 50 MCG/ACT nasal spray SPRAY 2 SPRAYS INTO EACH NOSTRIL EVERY DAY 48 mL 1   ibuprofen (ADVIL) 400 MG tablet TAKE 1 TABLET (400 MG TOTAL) BY MOUTH EVERY 6 (SIX) HOURS AS NEEDED. 90 tablet 1   levothyroxine (SYNTHROID) 88 MCG tablet Take 1 tablet (88 mcg total) by mouth daily before breakfast. 90 tablet 1   No facility-administered medications prior to visit.    Allergies  Allergen Reactions   Wellbutrin [Bupropion] Palpitations    palpitations    Review of Systems  Genitourinary:  Negative for frequency.       Objective:    Physical Exam Constitutional:      General: She is not in acute distress.    Appearance: Normal appearance.  HENT:     Head: Normocephalic and atraumatic.     Right Ear: External ear normal.     Left Ear: External ear normal.  Eyes:     Extraocular Movements: Extraocular movements intact.     Pupils: Pupils are equal, round, and reactive to light.  Cardiovascular:     Rate and Rhythm: Normal rate and regular rhythm.     Heart sounds: Normal heart sounds. No murmur heard.    No gallop.   Pulmonary:     Effort: Pulmonary effort is normal. No respiratory distress.     Breath sounds: Normal breath sounds. No wheezing or rales.  Skin:    General: Skin is warm.  Neurological:     Mental Status: She is alert and oriented to person, place, and time.  Psychiatric:        Judgment: Judgment normal.  BP 126/78 (BP Location: Right Arm, Patient Position: Sitting, Cuff Size: Normal)   Pulse (!) 59   Temp 98 F (36.7 C) (Oral)   Resp 16   Ht 5\' 1"  (1.549 m)   Wt 214 lb (97.1 kg)   LMP 08/10/2010   SpO2 96%   BMI 40.43 kg/m  Wt Readings from Last 3 Encounters:  03/16/23 214 lb (97.1 kg)  09/12/22 211 lb (95.7 kg)  09/11/22 211 lb 9.6 oz (96 kg)       Assessment & Plan:  Gastroesophageal reflux disease, unspecified whether esophagitis present Assessment & Plan: Avoid offending foods, start probiotics. Do not eat large meals in late evening and consider raising head of bed.    Orders: -     CBC with Differential/Platelet  Chronic ITP (idiopathic thrombocytopenia) (HCC) Assessment & Plan: Continues to monitor, asymptomatic   Hyperglycemia Assessment & Plan: hgba1c acceptable, minimize simple carbs. Increase exercise as tolerated.    Orders: -     Hemoglobin A1c -     Comprehensive metabolic panel  Hyperlipidemia, unspecified hyperlipidemia type Assessment & Plan: Encourage heart healthy diet such as MIND or DASH diet, increase exercise, avoid trans fats, simple carbohydrates and processed foods, consider a krill or fish or flaxseed oil cap daily. Tolerating Atorvastatin  Orders: -     Lipid panel  Hypothyroidism, unspecified type Assessment & Plan: On Levothyroxine, continue to monitor  Orders: -     TSH  Renal insufficiency Assessment & Plan: Hydrate and monitor   Orders: -     Comprehensive metabolic panel  Obesity, unspecified classification, unspecified obesity type, unspecified whether serious comorbidity present Assessment &  Plan: Encouraged DASH or MIND diet, decrease po intake and increase exercise as tolerated. Needs 7-8 hours of sleep nightly. Avoid trans fats, eat small, frequent meals every 4-5 hours with lean proteins, complex carbs and healthy fats. Minimize simple carbs, high fat foods and processed foods    Hypocalcemia Assessment & Plan: Supplement and monitor    Tobacco use disorder Assessment & Plan: Encouraged complete cessation. Discussed need to quit as relates to risk of numerous cancers, cardiac and pulmonary disease as well as neurologic complications. Counseled for greater than 3 minutes, when patient ready to quit offered meds when ready   Grief reaction Assessment & Plan: Lost her husband a year ago this month. She is going to spend time with her kids to get thru it offered counseling but declines for now  Orders: -     CBC with Differential/Platelet    I, Danise Edge, MD, personally preformed the services described in this documentation.  All medical record entries made by the scribe were at my direction and in my presence.  I have reviewed the chart and discharge instructions (if applicable) and agree that the record reflects my personal performance and is accurate and complete. 03/16/2023  Danise Edge, MD  [facility] as a scribe for Danise Edge, MD.,have documented all relevant documentation on the behalf of Danise Edge, MD,as directed by  Danise Edge, MD while in the presence of Danise Edge, MD.

## 2023-03-16 NOTE — Assessment & Plan Note (Addendum)
Encouraged complete cessation. Discussed need to quit as relates to risk of numerous cancers, cardiac and pulmonary disease as well as neurologic complications. Counseled for greater than 3 minutes, when patient ready to quit offered meds when ready

## 2023-03-17 ENCOUNTER — Other Ambulatory Visit: Payer: Self-pay

## 2023-03-17 MED ORDER — LEVOTHYROXINE SODIUM 100 MCG PO TABS: 100.0000 ug | ORAL_TABLET | Freq: Every day | ORAL | 1 refills | Status: DC

## 2023-03-18 ENCOUNTER — Other Ambulatory Visit: Payer: Self-pay

## 2023-03-18 MED ORDER — LEVOTHYROXINE SODIUM 100 MCG PO TABS: 100.0000 ug | ORAL_TABLET | Freq: Every day | ORAL | 1 refills | Status: DC

## 2023-03-19 ENCOUNTER — Other Ambulatory Visit: Payer: Self-pay | Admitting: Family Medicine

## 2023-05-10 ENCOUNTER — Other Ambulatory Visit: Payer: Self-pay | Admitting: Family Medicine

## 2023-05-10 DIAGNOSIS — E7800 Pure hypercholesterolemia, unspecified: Secondary | ICD-10-CM

## 2023-10-06 ENCOUNTER — Encounter: Payer: Managed Care, Other (non HMO) | Admitting: Family Medicine

## 2023-11-18 ENCOUNTER — Other Ambulatory Visit: Payer: Self-pay | Admitting: Family Medicine

## 2023-11-18 DIAGNOSIS — E7800 Pure hypercholesterolemia, unspecified: Secondary | ICD-10-CM

## 2023-12-12 ENCOUNTER — Other Ambulatory Visit: Payer: Self-pay | Admitting: Family Medicine

## 2024-03-04 ENCOUNTER — Other Ambulatory Visit: Payer: Self-pay | Admitting: Family Medicine

## 2024-03-23 ENCOUNTER — Other Ambulatory Visit: Payer: Self-pay | Admitting: Family Medicine
